# Patient Record
Sex: Male | Born: 1980 | Race: Black or African American | Hispanic: No | Marital: Married | Smoking: Former smoker
Health system: Southern US, Community
[De-identification: ages and names within clinical notes are randomized; demographics above are authoritative.]

## PROBLEM LIST (undated history)

## (undated) DIAGNOSIS — G43909 Migraine, unspecified, not intractable, without status migrainosus: Secondary | ICD-10-CM

## (undated) DIAGNOSIS — E785 Hyperlipidemia, unspecified: Secondary | ICD-10-CM

## (undated) DIAGNOSIS — I10 Essential (primary) hypertension: Secondary | ICD-10-CM

## (undated) DIAGNOSIS — Z9289 Personal history of other medical treatment: Secondary | ICD-10-CM

## (undated) DIAGNOSIS — F1210 Cannabis abuse, uncomplicated: Secondary | ICD-10-CM

## (undated) DIAGNOSIS — I2119 ST elevation (STEMI) myocardial infarction involving other coronary artery of inferior wall: Secondary | ICD-10-CM

## (undated) DIAGNOSIS — I2510 Atherosclerotic heart disease of native coronary artery without angina pectoris: Secondary | ICD-10-CM

## (undated) DIAGNOSIS — Z9861 Coronary angioplasty status: Secondary | ICD-10-CM

## (undated) HISTORY — PX: CARDIAC CATHETERIZATION: SHX172

## (undated) HISTORY — DX: Coronary angioplasty status: Z98.61

## (undated) HISTORY — DX: ST elevation (STEMI) myocardial infarction involving other coronary artery of inferior wall: I21.19

## (undated) HISTORY — DX: Atherosclerotic heart disease of native coronary artery without angina pectoris: I25.10

---
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
## 2000-03-03 ENCOUNTER — Emergency Department (HOSPITAL_COMMUNITY): Admission: EM | Admit: 2000-03-03 | Discharge: 2000-03-03 | Payer: Self-pay | Admitting: Emergency Medicine

## 2000-05-09 ENCOUNTER — Emergency Department (HOSPITAL_COMMUNITY): Admission: EM | Admit: 2000-05-09 | Discharge: 2000-05-09 | Payer: Self-pay | Admitting: Emergency Medicine

## 2000-05-09 ENCOUNTER — Encounter: Payer: Self-pay | Admitting: Emergency Medicine

## 2004-03-17 ENCOUNTER — Emergency Department (HOSPITAL_COMMUNITY): Admission: EM | Admit: 2004-03-17 | Discharge: 2004-03-17 | Payer: Self-pay | Admitting: Emergency Medicine

## 2004-06-26 ENCOUNTER — Ambulatory Visit: Payer: Self-pay | Admitting: Family Medicine

## 2009-09-16 HISTORY — PX: LEFT HEART CATH AND CORONARY ANGIOGRAPHY: CATH118249

## 2009-10-04 ENCOUNTER — Inpatient Hospital Stay (HOSPITAL_COMMUNITY): Admission: EM | Admit: 2009-10-04 | Discharge: 2009-10-07 | Payer: Self-pay | Admitting: Emergency Medicine

## 2009-10-04 ENCOUNTER — Emergency Department (HOSPITAL_COMMUNITY): Admission: EM | Admit: 2009-10-04 | Discharge: 2009-10-04 | Payer: Self-pay | Admitting: Family Medicine

## 2009-10-04 ENCOUNTER — Encounter: Payer: Self-pay | Admitting: Physician Assistant

## 2009-10-24 ENCOUNTER — Ambulatory Visit: Payer: Self-pay | Admitting: Family Medicine

## 2009-10-24 ENCOUNTER — Encounter: Payer: Self-pay | Admitting: Physician Assistant

## 2009-10-24 ENCOUNTER — Telehealth: Payer: Self-pay | Admitting: Physician Assistant

## 2009-10-24 DIAGNOSIS — J45909 Unspecified asthma, uncomplicated: Secondary | ICD-10-CM | POA: Insufficient documentation

## 2009-10-24 LAB — CONVERTED CEMR LAB
ALT: 33 units/L (ref 0–53)
AST: 18 units/L (ref 0–37)
Albumin: 4.7 g/dL (ref 3.5–5.2)
Alkaline Phosphatase: 70 units/L (ref 39–117)
BUN: 12 mg/dL (ref 6–23)
CO2: 24 meq/L (ref 19–32)
Calcium: 9.3 mg/dL (ref 8.4–10.5)
Chloride: 106 meq/L (ref 96–112)
Creatinine, Ser: 0.72 mg/dL (ref 0.40–1.50)
Glucose, Bld: 141 mg/dL — ABNORMAL HIGH (ref 70–99)
Potassium: 4.1 meq/L (ref 3.5–5.3)
Sodium: 140 meq/L (ref 135–145)
Total Bilirubin: 0.6 mg/dL (ref 0.3–1.2)
Total Protein: 7.2 g/dL (ref 6.0–8.3)

## 2009-10-25 ENCOUNTER — Telehealth: Payer: Self-pay | Admitting: Physician Assistant

## 2009-10-25 DIAGNOSIS — R7309 Other abnormal glucose: Secondary | ICD-10-CM | POA: Insufficient documentation

## 2010-03-16 ENCOUNTER — Ambulatory Visit: Payer: Self-pay | Admitting: Physician Assistant

## 2010-04-18 NOTE — Progress Notes (Signed)
Summary: Needs A1C  Phone Note Outgoing Call   Summary of Call: kidney and liver function ok sugar is high have him come in for fasting sugar and A1C Initial call taken by: Tereso Newcomer PA-C,  October 25, 2009 10:35 AM  Follow-up for Phone Call        Left message on answering machine for pt to call back...Marland KitchenMarland KitchenArmenia Shannon  October 25, 2009 10:42 AM   Additional Follow-up for Phone Call Additional follow up Details #1::        pt is aware and has appt scheduled.... Additional Follow-up by: [place] Shannon,  October 26, 2009 4:06 PM  New Problems: HYPERGLYCEMIA (ICD-790.29)   New Problems: HYPERGLYCEMIA (ICD-790.29)    Impression & Recommendations:  Problem # 1:  HYPERGLYCEMIA (ICD-790.29) on random cmet get fasting CBG and A1C  Complete Medication List: 1)  Aspirin 325 Mg Tabs (Aspirin) .... Take 1 tablet by mouth once a day 2)  Plavix 75 Mg Tabs (Clopidogrel bisulfate) .... Take 1 tablet by mouth once a day 3)  Labetalol Hcl 200 Mg Tabs (Labetalol hcl) .... Take 1 tablet by mouth two times a day 4)  Lisinopril 20 Mg Tabs (Lisinopril) .... Take 1 tablet by mouth once a day 5)  Metoprolol Tartrate 50 Mg Tabs (Metoprolol tartrate) .... Take 1 tablet by mouth two times a day 6)  Simvastatin 20 Mg Tabs (Simvastatin) .... Take 1 tab by mouth at bedtime 7)  Nitrostat 0.4 Mg Subl (Nitroglycerin) .... Take one under your tongue every 5 minutes as needed for chest pain

## 2010-04-18 NOTE — Letter (Signed)
Summary: PT INFORMATION SHEET  PT INFORMATION SHEET   Imported By: Arta Bruce 10/31/2009 15:56:40  _____________________________________________________________________  External Attachment:    Type:   Image     Comment:   External Document

## 2010-04-18 NOTE — Progress Notes (Signed)
Summary: Current Medication regimen  Phone Note Outgoing Call   Summary of Call: Notify patient I spoke to the NP for Dr. Deborah Chalk. They plan to increase his metoprolol and decrease his labetalol (until it is stopped) over time. So, continue same meds and discuss with Dr. Deborah Chalk at f/u.  Initial call taken by: Tereso Newcomer PA-C,  October 24, 2009 3:54 PM  Follow-up for Phone Call        Advised pt. of provider's response and instructions -- verbalized understanding.   Follow-up by: Dutch Quint RN,  October 24, 2009 4:52 PM

## 2010-04-18 NOTE — Assessment & Plan Note (Signed)
Summary: XFU, HEART ATTACK/LR   Vital Signs:  Patient profile:   30 year old male Height:      72 inches Weight:      268 pounds BMI:     36.48 Temp:     98.0 degrees F Pulse rate:   83 / minute Pulse rhythm:   regular Resp:     16 per minute BP sitting:   138 / 86  (left arm) Cuff size:   large  Vitals Entered By: Vesta Mixer CMA (October 24, 2009 2:09 PM) CC: One time hosp f/u--Heart attack was in there for 4 days Pain Assessment Patient in pain? no       Does patient need assistance? Ambulation Normal   Primary Care Provider:  Tereso Newcomer, PA-C  CC:  One time hosp f/u--Heart attack was in there for 4 days.  History of Present Illness: 30 year old male presents as a new patient.  He presented to [facility] on July 19 with complaints of chest discomfort.  He had several bouts of chest discomfort prior to this.  This time, it was radiating to his back.  Initially, he had an EKG and it sounds as though they were going to send him home from the urgent care.  However, EMS arrived and they checked another EKG and he had inferior ST elevation.  He was sent to [facility] and went directly to the cardiac catheterization lab.  His cardiac catheterization demonstrated an EF of 55%, question of mild anterior hypokinesis, luminal irregularities but no significant obstructive disease.  His CKMB  peaked at 88.9 and his troponin 8.04.  He was treated medically with Plavix and aspirin.  He was started on 2 beta blockers, ACE inhibitor, and a statin.  He has done well since discharge fron the hospital.  He denies any further chest discomfort.  He denies shortness of breath.  He denies orthopnea, PND or edema.  He denies syncope.  He is somewhat concerned about what caused his heart attack.  He asked whether or not coronary vasospasm could be a possibility.  Current Medications (verified): 1)  Aspirin 325 Mg Tabs (Aspirin) .... Take 1 Tablet By Mouth Once A Day 2)  Plavix 75  Mg Tabs (Clopidogrel Bisulfate) .... Take 1 Tablet By Mouth Once A Day 3)  Labetalol Hcl 200 Mg Tabs (Labetalol Hcl) .... Take 1 Tablet By Mouth Two Times A Day 4)  Lisinopril 20 Mg Tabs (Lisinopril) .... Take 1 Tablet By Mouth Once A Day 5)  Metoprolol Tartrate 50 Mg Tabs (Metoprolol Tartrate) .... Take 1 Tablet By Mouth Two Times A Day 6)  Simvastatin 20 Mg Tabs (Simvastatin) .... Take 1 Tab By Mouth At Bedtime 7)  Nitrostat 0.4 Mg Subl (Nitroglycerin) .... Take One Under Your Tongue Every 5 Minutes As Needed For Chest Pain  Allergies (verified): 1)  ! Penicillin  Past History:  Past Medical History: 4/06 BMI 34.1, 4/06 CMP wnl s/p Inferior STEMI 10/04/2009   a.  heart cath with luminal irreg's but no significant blockage   b.  LVF normal; EF 55%; ? very mild inferior HK Hyperlipidemia Hypertension Asthma   a. as a child; no Rx at this time  Past Surgical History: cardiac cath 10/04/2009:  luminal irregs RCA, LAD and CFx, no flow liimiting lesions; EF 55% with ? mild inf HK  Family History: aunts w/ breast CA,  father- alive, age 55, MIx2, s/p CABG HTN, grandfather- dead of MI,  mother- alive, 36,  HTN, No h/o DM  Social History: From [place]. Played football/basketball @ [employer].   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   Enjoys work.   Quit cigs 09/2009 (previous 12 cigs/day) no EtOH, no drugs.   Unmarried,  1 daughter  and 1 son  Review of Systems      See HPI General:  Denies chills and fever. GI:  Denies bloody stools and dark tarry stools. GU:  Denies hematuria.  Physical Exam  General:  alert, well-developed, and well-nourished.   Head:  normocephalic and atraumatic.   Neck:  supple.  no jvd  Lungs:  normal breath sounds, no crackles, and no wheezes.   Heart:  normal rate, regular rhythm, no murmur, and no gallop.   Abdomen:  soft, non-tender, no hepatomegaly, and no splenomegaly.     Extremities:  no edema  Neurologic:  alert & oriented X3 and cranial nerves II-XII intact.   Psych:  normally interactive.     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 if he had coronary vasospasm as a cause of his MI has f/u with Card in next 1-2 weeks . . . ? if labetalol should be discontinued (on 2 beta blockers) and a calcium channel blocker or nitrate should be added not sure why he is on 2 beta blockers but will defer changing meds to cardiology (HR ok) patient has a lot of questions regarding spasm advised him to discuss with Dr. Rudean Haskell . Marland Kitchendiscussed with Laveda Abbe, NP for Dr. Deborah Chalk and the plan was to decrease his labetalol and increase his metoprolol over time  still on ASA ran out of Plavix . . . will try to get him some today or tomorrow with our pharmacy  His updated medication list for this problem includes:    Aspirin 325 Mg Tabs (Aspirin) .Marland Kitchen... Take 1 tablet by mouth once a day    Plavix 75 Mg Tabs (Clopidogrel bisulfate) .Marland Kitchen... Take 1 tablet by mouth once a day    Labetalol Hcl 200 Mg Tabs (Labetalol hcl) .Marland Kitchen... Take 1 tablet by mouth two times a day    Lisinopril 20 Mg Tabs (Lisinopril) .Marland Kitchen... Take 1 tablet by mouth once a day    Metoprolol Tartrate 50 Mg Tabs (Metoprolol tartrate) .Marland Kitchen... Take 1 tablet by mouth two times a day    Nitrostat 0.4 Mg Subl (Nitroglycerin) .Marland Kitchen... Take one under your tongue every 5 minutes as needed for chest pain  Problem # 2:  HYPERTENSION (ICD-401.9) Assessment: Improved  continue same meds  Orders: T-Comprehensive Metabolic Panel ([id number])  His updated medication list for this problem includes:    Labetalol Hcl 200 Mg Tabs (Labetalol hcl) .Marland Kitchen... Take 1 tablet by mouth two times a day    Lisinopril 20 Mg Tabs (Lisinopril) .Marland Kitchen... Take 1 tablet by mouth once a day    Metoprolol Tartrate 50 Mg Tabs (Metoprolol tartrate) .Marland Kitchen... Take 1  tablet by mouth two times a day  Problem # 3:  HYPERLIPIDEMIA (ICD-272.4)  repeat FLP and LFTs in 3 mos LDL 130 in 09/2009 at hosp  Orders: T-Comprehensive Metabolic Panel [phone])  His updated medication list for this problem includes:    Simvastatin 20 Mg Tabs (Simvastatin) .Marland Kitchen... Take 1 tab by mouth at bedtime  Problem # 4:  Preventive Health Care (ICD-V70.0) get CPE in 3 mos Hgb 16.7  in hosp 09/2009  Problem # 5:  ASTHMA (ICD-493.90) intermittent no symptoms in years  Complete Medication List: 1)  Aspirin 325 Mg Tabs (Aspirin) .... Take 1 tablet by mouth once a day 2)  Plavix 75 Mg Tabs (Clopidogrel bisulfate) .... Take 1 tablet by mouth once a day 3)  Labetalol Hcl 200 Mg Tabs (Labetalol hcl) .... Take 1 tablet by mouth two times a day 4)  Lisinopril 20 Mg Tabs (Lisinopril) .... Take 1 tablet by mouth once a day 5)  Metoprolol Tartrate 50 Mg Tabs (Metoprolol tartrate) .... Take 1 tablet by mouth two times a day 6)  Simvastatin 20 Mg Tabs (Simvastatin) .... Take 1 tab by mouth at bedtime 7)  Nitrostat 0.4 Mg Subl (Nitroglycerin) .... Take one under your tongue every 5 minutes as needed for chest pain  Patient Instructions: 1)  Get eligibility appointment. 2)  We will get our pharmacy to get your Plavix filled today or tomorrow. 3)  Keep taking Aspirin daily. 4)  Try to avoid caffeine as much as possible, over the counter cold medicines, etc.  Avoid any type of stimulant or decongestant. 5)  Please schedule a follow-up appointment in 3 months with [facility] for CPE.  Come fasting for lipids check.  Prescriptions: NITROSTAT 0.4 MG SUBL (NITROGLYCERIN) Take one under your tongue every 5 minutes as needed for chest pain  #25 x 11   Entered and Authorized by:   Tereso Newcomer PA-C   Signed by:   Tereso Newcomer PA-C on 10/24/2009   Method used:   Faxed to ...       [facility] (retail)       [address] [place], [place]  [address]        Ph: [phone]       Fax: [phone]   RxID:   [id number] PLAVIX 75 MG TABS (CLOPIDOGREL BISULFATE) Take 1 tablet by mouth once a day  #30 x 11   Entered and Authorized by:   Tereso Newcomer PA-C   Signed by:   Tereso Newcomer PA-C on 10/24/2009   Method used:   Faxed to ...       [facility] - [facility] (retail)       [address] [place], [place]  [address]       Ph: [phone]       Fax: [phone]   RxID:   [id number]

## 2010-04-18 NOTE — Letter (Signed)
Summary: CARDIAC CATHETERIZATION  CARDIAC CATHETERIZATION   Imported By: Arta Bruce 10/28/2009 15:37:55  _____________________________________________________________________  External Attachment:    Type:   Image     Comment:   External Document

## 2010-06-03 LAB — MRSA PCR SCREENING: MRSA by PCR: NEGATIVE

## 2010-06-03 LAB — DIFFERENTIAL
Eosinophils Absolute: 0 10*3/uL (ref 0.0–0.7)
Eosinophils Relative: 0 % (ref 0–5)
Lymphocytes Relative: 8 % — ABNORMAL LOW (ref 12–46)
Lymphs Abs: 1.1 10*3/uL (ref 0.7–4.0)
Monocytes Absolute: 0.6 10*3/uL (ref 0.1–1.0)
Monocytes Relative: 4 % (ref 3–12)

## 2010-06-03 LAB — POCT CARDIAC MARKERS
CKMB, poc: 3.1 ng/mL (ref 1.0–8.0)
Myoglobin, poc: 100 ng/mL (ref 12–200)

## 2010-06-03 LAB — CBC
HCT: 49.9 % (ref 39.0–52.0)
MCH: 31.7 pg (ref 26.0–34.0)
MCH: 32.1 pg (ref 26.0–34.0)
MCHC: 33.9 g/dL (ref 30.0–36.0)
MCV: 94.5 fL (ref 78.0–100.0)
MCV: 94.6 fL (ref 78.0–100.0)
Platelets: 175 10*3/uL (ref 150–400)
Platelets: 193 10*3/uL (ref 150–400)
RBC: 5.29 MIL/uL (ref 4.22–5.81)
RBC: 5.34 MIL/uL (ref 4.22–5.81)
RDW: 13.3 % (ref 11.5–15.5)
WBC: 14.2 10*3/uL — ABNORMAL HIGH (ref 4.0–10.5)

## 2010-06-03 LAB — POCT I-STAT, CHEM 8
BUN: 12 mg/dL (ref 6–23)
Calcium, Ion: 1.1 mmol/L — ABNORMAL LOW (ref 1.12–1.32)
Chloride: 105 mEq/L (ref 96–112)
Creatinine, Ser: 0.8 mg/dL (ref 0.4–1.5)
Glucose, Bld: 97 mg/dL (ref 70–99)
HCT: 54 % — ABNORMAL HIGH (ref 39.0–52.0)
Hemoglobin: 18.4 g/dL — ABNORMAL HIGH (ref 13.0–17.0)
Potassium: 4.1 mEq/L (ref 3.5–5.1)
Sodium: 141 mEq/L (ref 135–145)
TCO2: 26 mmol/L (ref 0–100)

## 2010-06-03 LAB — LIPID PANEL
Triglycerides: 142 mg/dL (ref <150)
VLDL: 28 mg/dL (ref 0–40)

## 2010-06-03 LAB — BASIC METABOLIC PANEL
BUN: 8 mg/dL (ref 6–23)
CO2: 28 mEq/L (ref 19–32)
Chloride: 104 mEq/L (ref 96–112)
Creatinine, Ser: 0.74 mg/dL (ref 0.4–1.5)
GFR calc Af Amer: >60 mL/min (ref >60)
Potassium: 4 mEq/L (ref 3.5–5.1)

## 2010-06-03 LAB — CARDIAC PANEL(CRET KIN+CKTOT+MB+TROPI)
CK, MB: 70.9 ng/mL (ref 0.3–4.0)
CK, MB: 88.9 ng/mL (ref 0.3–4.0)
Relative Index: 10.9 — ABNORMAL HIGH (ref 0.0–2.5)
Relative Index: 8.9 — ABNORMAL HIGH (ref 0.0–2.5)
Total CK: 795 U/L — ABNORMAL HIGH (ref 7–232)
Troponin I: 4.24 ng/mL (ref 0.00–0.06)
Troponin I: 8.04 ng/mL (ref 0.00–0.06)

## 2010-06-03 LAB — HEPARIN LEVEL (UNFRACTIONATED)
Heparin Unfractionated: 0.16 IU/mL — ABNORMAL LOW (ref 0.30–0.70)
Heparin Unfractionated: 0.37 IU/mL (ref 0.30–0.70)

## 2013-11-20 ENCOUNTER — Encounter (HOSPITAL_BASED_OUTPATIENT_CLINIC_OR_DEPARTMENT_OTHER): Payer: Self-pay | Admitting: Emergency Medicine

## 2013-11-20 ENCOUNTER — Inpatient Hospital Stay (HOSPITAL_BASED_OUTPATIENT_CLINIC_OR_DEPARTMENT_OTHER)
Admission: EM | Admit: 2013-11-20 | Discharge: 2013-11-23 | DRG: 247 | Disposition: A | Discharge status: Home / Self Care | Payer: Managed Care, Other (non HMO) | Attending: Cardiology | Admitting: Cardiology

## 2013-11-20 ENCOUNTER — Emergency Department (HOSPITAL_BASED_OUTPATIENT_CLINIC_OR_DEPARTMENT_OTHER): Payer: Managed Care, Other (non HMO)

## 2013-11-20 ENCOUNTER — Encounter (HOSPITAL_COMMUNITY)
Admission: EM | Disposition: A | Discharge status: Home / Self Care | Payer: Managed Care, Other (non HMO) | Source: Home / Self Care | Attending: Cardiology

## 2013-11-20 DIAGNOSIS — I10 Essential (primary) hypertension: Secondary | ICD-10-CM | POA: Diagnosis present

## 2013-11-20 DIAGNOSIS — I442 Atrioventricular block, complete: Secondary | ICD-10-CM | POA: Diagnosis not present

## 2013-11-20 DIAGNOSIS — I4729 Other ventricular tachycardia: Secondary | ICD-10-CM | POA: Diagnosis not present

## 2013-11-20 DIAGNOSIS — R0989 Other specified symptoms and signs involving the circulatory and respiratory systems: Secondary | ICD-10-CM | POA: Diagnosis present

## 2013-11-20 DIAGNOSIS — I472 Ventricular tachycardia, unspecified: Secondary | ICD-10-CM | POA: Diagnosis not present

## 2013-11-20 DIAGNOSIS — Z6830 Body mass index (BMI) 30.0-30.9, adult: Secondary | ICD-10-CM | POA: Diagnosis not present

## 2013-11-20 DIAGNOSIS — I219 Acute myocardial infarction, unspecified: Secondary | ICD-10-CM

## 2013-11-20 DIAGNOSIS — Z8249 Family history of ischemic heart disease and other diseases of the circulatory system: Secondary | ICD-10-CM | POA: Diagnosis not present

## 2013-11-20 DIAGNOSIS — I2119 ST elevation (STEMI) myocardial infarction involving other coronary artery of inferior wall: Principal | ICD-10-CM | POA: Diagnosis present

## 2013-11-20 DIAGNOSIS — E8779 Other fluid overload: Secondary | ICD-10-CM | POA: Diagnosis present

## 2013-11-20 DIAGNOSIS — F1210 Cannabis abuse, uncomplicated: Secondary | ICD-10-CM | POA: Diagnosis present

## 2013-11-20 DIAGNOSIS — IMO0001 Reserved for inherently not codable concepts without codable children: Secondary | ICD-10-CM | POA: Diagnosis present

## 2013-11-20 DIAGNOSIS — Z87891 Personal history of nicotine dependence: Secondary | ICD-10-CM

## 2013-11-20 DIAGNOSIS — R0683 Snoring: Secondary | ICD-10-CM

## 2013-11-20 DIAGNOSIS — Z9861 Coronary angioplasty status: Secondary | ICD-10-CM | POA: Diagnosis not present

## 2013-11-20 DIAGNOSIS — I2582 Chronic total occlusion of coronary artery: Secondary | ICD-10-CM | POA: Diagnosis present

## 2013-11-20 DIAGNOSIS — E66812 Obesity, class 2: Secondary | ICD-10-CM | POA: Diagnosis present

## 2013-11-20 DIAGNOSIS — E785 Hyperlipidemia, unspecified: Secondary | ICD-10-CM | POA: Diagnosis present

## 2013-11-20 DIAGNOSIS — I498 Other specified cardiac arrhythmias: Secondary | ICD-10-CM | POA: Diagnosis not present

## 2013-11-20 DIAGNOSIS — R51 Headache: Secondary | ICD-10-CM | POA: Diagnosis present

## 2013-11-20 DIAGNOSIS — I2129 ST elevation (STEMI) myocardial infarction involving other sites: Secondary | ICD-10-CM | POA: Insufficient documentation

## 2013-11-20 DIAGNOSIS — I252 Old myocardial infarction: Secondary | ICD-10-CM | POA: Insufficient documentation

## 2013-11-20 DIAGNOSIS — R0609 Other forms of dyspnea: Secondary | ICD-10-CM | POA: Diagnosis present

## 2013-11-20 DIAGNOSIS — Z880 Allergy status to penicillin: Secondary | ICD-10-CM | POA: Diagnosis not present

## 2013-11-20 DIAGNOSIS — I2510 Atherosclerotic heart disease of native coronary artery without angina pectoris: Secondary | ICD-10-CM

## 2013-11-20 DIAGNOSIS — R072 Precordial pain: Secondary | ICD-10-CM

## 2013-11-20 DIAGNOSIS — I213 ST elevation (STEMI) myocardial infarction of unspecified site: Secondary | ICD-10-CM

## 2013-11-20 DIAGNOSIS — R079 Chest pain, unspecified: Secondary | ICD-10-CM | POA: Diagnosis present

## 2013-11-20 HISTORY — DX: Hyperlipidemia, unspecified: E78.5

## 2013-11-20 HISTORY — PX: PERCUTANEOUS CORONARY STENT INTERVENTION (PCI-S): SHX6016

## 2013-11-20 HISTORY — DX: Atherosclerotic heart disease of native coronary artery without angina pectoris: I25.10

## 2013-11-20 HISTORY — DX: Essential (primary) hypertension: I10

## 2013-11-20 HISTORY — PX: TRANSTHORACIC ECHOCARDIOGRAM: SHX275

## 2013-11-20 HISTORY — PX: LEFT HEART CATHETERIZATION WITH CORONARY ANGIOGRAM: SHX5451

## 2013-11-20 HISTORY — DX: ST elevation (STEMI) myocardial infarction involving other coronary artery of inferior wall: I21.19

## 2013-11-20 HISTORY — DX: Cannabis abuse, uncomplicated: F12.10

## 2013-11-20 HISTORY — DX: Migraine, unspecified, not intractable, without status migrainosus: G43.909

## 2013-11-20 HISTORY — DX: Morbid (severe) obesity due to excess calories: E66.01

## 2013-11-20 HISTORY — DX: Coronary angioplasty status: Z98.61

## 2013-11-20 LAB — MRSA PCR SCREENING: MRSA by PCR: NEGATIVE

## 2013-11-20 LAB — CBC
HCT: 46.4 % (ref 39.0–52.0)
HCT: 49.2 % (ref 39.0–52.0)
HCT: 48.3 % (ref 39.0–52.0)
HEMOGLOBIN: 17.2 g/dL — AB (ref 13.0–17.0)
Hemoglobin: 16.2 g/dL (ref 13.0–17.0)
Hemoglobin: 17.3 g/dL — ABNORMAL HIGH (ref 13.0–17.0)
MCH: 31 pg (ref 26.0–34.0)
MCH: 31.2 pg (ref 26.0–34.0)
MCH: 31.4 pg (ref 26.0–34.0)
MCHC: 34.9 g/dL (ref 30.0–36.0)
MCHC: 35.2 g/dL (ref 30.0–36.0)
MCHC: 35.6 g/dL (ref 30.0–36.0)
MCV: 88.9 fL (ref 78.0–100.0)
MCV: 88.8 fL (ref 78.0–100.0)
MCV: 88.1 fL (ref 78.0–100.0)
PLATELETS: 261 10*3/uL (ref 150–400)
PLATELETS: 244 10*3/uL (ref 150–400)
Platelets: 224 10*3/uL (ref 150–400)
RBC: 5.22 MIL/uL (ref 4.22–5.81)
RBC: 5.54 MIL/uL (ref 4.22–5.81)
RBC: 5.48 MIL/uL (ref 4.22–5.81)
RDW: 13.1 % (ref 11.5–15.5)
RDW: 13.2 % (ref 11.5–15.5)
RDW: 13.1 % (ref 11.5–15.5)
WBC: 13.6 10*3/uL — ABNORMAL HIGH (ref 4.0–10.5)
WBC: 12.5 10*3/uL — ABNORMAL HIGH (ref 4.0–10.5)
WBC: 17.6 10*3/uL — ABNORMAL HIGH (ref 4.0–10.5)

## 2013-11-20 LAB — COMPREHENSIVE METABOLIC PANEL
ALT: 16 U/L (ref 0–53)
ANION GAP: 13 (ref 5–15)
AST: 13 U/L (ref 0–37)
Albumin: 4.1 g/dL (ref 3.5–5.2)
Alkaline Phosphatase: 85 U/L (ref 39–117)
BUN: 17 mg/dL (ref 6–23)
CALCIUM: 9.5 mg/dL (ref 8.4–10.5)
CO2: 26 meq/L (ref 19–32)
CREATININE: 0.8 mg/dL (ref 0.50–1.35)
Chloride: 100 mEq/L (ref 96–112)
GFR calc Af Amer: >90 mL/min (ref >90)
Glucose, Bld: 188 mg/dL — ABNORMAL HIGH (ref 70–99)
Potassium: 4.2 mEq/L (ref 3.7–5.3)
Sodium: 139 mEq/L (ref 137–147)
Total Bilirubin: 0.8 mg/dL (ref 0.3–1.2)
Total Protein: 8 g/dL (ref 6.0–8.3)

## 2013-11-20 LAB — TROPONIN I
Troponin I: <0.3 ng/mL (ref <0.30)
Troponin I: >20 ng/mL (ref <0.30)

## 2013-11-20 LAB — PROTIME-INR
INR: 1.05 (ref 0.00–1.49)
PROTHROMBIN TIME: 13.7 s (ref 11.6–15.2)

## 2013-11-20 LAB — CREATININE, SERUM
Creatinine, Ser: 0.61 mg/dL (ref 0.50–1.35)
GFR calc Af Amer: >90 mL/min (ref >90)
GFR calc non Af Amer: >90 mL/min (ref >90)

## 2013-11-20 LAB — APTT: aPTT: 26 seconds (ref 24–37)

## 2013-11-20 SURGERY — LEFT HEART CATHETERIZATION WITH CORONARY ANGIOGRAM
Anesthesia: LOCAL

## 2013-11-20 MED ORDER — FUROSEMIDE 10 MG/ML IJ SOLN: 40.0000 mg | Freq: Once | INTRAMUSCULAR | Status: DC

## 2013-11-20 MED ORDER — BIVALIRUDIN 250 MG IV SOLR
INTRAVENOUS | Status: AC
  Filled 2013-11-20: qty 250

## 2013-11-20 MED ORDER — SODIUM CHLORIDE 0.9 % IV SOLN: INTRAVENOUS | Status: DC

## 2013-11-20 MED ORDER — HEPARIN SODIUM (PORCINE) 5000 UNIT/ML IJ SOLN
5000.0000 [IU] | Freq: Three times a day (TID) | INTRAMUSCULAR | Status: DC
  Administered 2013-11-20 – 2013-11-23 (×8): 5000 [IU] via SUBCUTANEOUS
  Filled 2013-11-20 (×11): qty 1

## 2013-11-20 MED ORDER — PROMETHAZINE HCL 25 MG/ML IJ SOLN
12.5000 mg | Freq: Once | INTRAMUSCULAR | Status: AC
  Administered 2013-11-20: 12.5 mg via INTRAVENOUS
  Filled 2013-11-20: qty 1

## 2013-11-20 MED ORDER — LISINOPRIL 2.5 MG PO TABS
2.5000 mg | ORAL_TABLET | Freq: Every day | ORAL | Status: DC
  Administered 2013-11-20: 2.5 mg via ORAL
  Filled 2013-11-20: qty 1

## 2013-11-20 MED ORDER — SODIUM CHLORIDE 0.9 % IJ SOLN: 3.0000 mL | Freq: Two times a day (BID) | INTRAMUSCULAR | Status: DC

## 2013-11-20 MED ORDER — HEPARIN SODIUM (PORCINE) 1000 UNIT/ML IJ SOLN
INTRAMUSCULAR | Status: AC
  Filled 2013-11-20: qty 1

## 2013-11-20 MED ORDER — PRASUGREL HCL 10 MG PO TABS
ORAL_TABLET | ORAL | Status: AC
  Filled 2013-11-20: qty 6

## 2013-11-20 MED ORDER — HEPARIN BOLUS VIA INFUSION: 4000.0000 [IU] | Freq: Once | INTRAVENOUS | Status: DC

## 2013-11-20 MED ORDER — TIROFIBAN HCL IV 5 MG/100ML
0.1500 ug/kg/min | INTRAVENOUS | Status: AC
  Administered 2013-11-20 (×4): 0.15 ug/kg/min via INTRAVENOUS
  Filled 2013-11-20 (×6): qty 100

## 2013-11-20 MED ORDER — FENTANYL CITRATE 0.05 MG/ML IJ SOLN
INTRAMUSCULAR | Status: AC
  Filled 2013-11-20: qty 2

## 2013-11-20 MED ORDER — ASPIRIN 81 MG PO CHEW
324.0000 mg | CHEWABLE_TABLET | Freq: Once | ORAL | Status: AC
  Administered 2013-11-20: 324 mg via ORAL

## 2013-11-20 MED ORDER — MIDAZOLAM HCL 2 MG/2ML IJ SOLN
INTRAMUSCULAR | Status: AC
  Filled 2013-11-20: qty 2

## 2013-11-20 MED ORDER — HEPARIN (PORCINE) IN NACL 100-0.45 UNIT/ML-% IJ SOLN
INTRAMUSCULAR | Status: AC
  Filled 2013-11-20: qty 250

## 2013-11-20 MED ORDER — METOPROLOL TARTRATE 1 MG/ML IV SOLN
INTRAVENOUS | Status: AC
  Filled 2013-11-20: qty 5

## 2013-11-20 MED ORDER — MORPHINE SULFATE 2 MG/ML IJ SOLN
2.0000 mg | INTRAMUSCULAR | Status: DC | PRN
  Administered 2013-11-20 (×2): 2 mg via INTRAVENOUS
  Filled 2013-11-20 (×3): qty 1

## 2013-11-20 MED ORDER — PNEUMOCOCCAL VAC POLYVALENT 25 MCG/0.5ML IJ INJ
0.5000 mL | INJECTION | INTRAMUSCULAR | Status: DC
  Filled 2013-11-20: qty 0.5

## 2013-11-20 MED ORDER — NITROGLYCERIN IN D5W 200-5 MCG/ML-% IV SOLN: 2.0000 ug/min | INTRAVENOUS | Status: DC

## 2013-11-20 MED ORDER — HYDRALAZINE HCL 20 MG/ML IJ SOLN
10.0000 mg | Freq: Once | INTRAMUSCULAR | Status: AC
  Administered 2013-11-21: 10 mg via INTRAVENOUS
  Filled 2013-11-20: qty 1

## 2013-11-20 MED ORDER — SODIUM CHLORIDE 0.9 % IV SOLN
1.7500 mg/kg/h | INTRAVENOUS | Status: AC
  Administered 2013-11-20: 1.75 mg/kg/h via INTRAVENOUS
  Filled 2013-11-20: qty 250

## 2013-11-20 MED ORDER — ASPIRIN 81 MG PO CHEW
CHEWABLE_TABLET | ORAL | Status: AC
  Filled 2013-11-20: qty 4

## 2013-11-20 MED ORDER — CARVEDILOL 6.25 MG PO TABS
6.2500 mg | ORAL_TABLET | Freq: Two times a day (BID) | ORAL | Status: DC
  Administered 2013-11-20 – 2013-11-21 (×3): 6.25 mg via ORAL
  Filled 2013-11-20 (×6): qty 1

## 2013-11-20 MED ORDER — BLOOD PRESSURE CONTROL BOOK
Freq: Once | Status: AC
  Administered 2013-11-21: 05:00:00
  Filled 2013-11-20: qty 1

## 2013-11-20 MED ORDER — ASPIRIN EC 81 MG PO TBEC
81.0000 mg | DELAYED_RELEASE_TABLET | Freq: Every day | ORAL | Status: DC
  Administered 2013-11-21 – 2013-11-23 (×3): 81 mg via ORAL
  Filled 2013-11-20 (×3): qty 1

## 2013-11-20 MED ORDER — ONDANSETRON HCL 4 MG/2ML IJ SOLN
4.0000 mg | Freq: Four times a day (QID) | INTRAMUSCULAR | Status: DC | PRN
  Administered 2013-11-20 (×2): 4 mg via INTRAVENOUS
  Filled 2013-11-20 (×2): qty 2

## 2013-11-20 MED ORDER — METOPROLOL TARTRATE 1 MG/ML IV SOLN: 2.5000 mg | Freq: Four times a day (QID) | INTRAVENOUS | Status: DC | PRN

## 2013-11-20 MED ORDER — SODIUM CHLORIDE 0.9 % IV SOLN
INTRAVENOUS | Status: AC
  Administered 2013-11-20: 06:00:00 via INTRAVENOUS

## 2013-11-20 MED ORDER — VERAPAMIL HCL 2.5 MG/ML IV SOLN
INTRAVENOUS | Status: AC
  Filled 2013-11-20: qty 2

## 2013-11-20 MED ORDER — NITROGLYCERIN 1 MG/10 ML FOR IR/CATH LAB
INTRA_ARTERIAL | Status: AC
  Filled 2013-11-20: qty 10

## 2013-11-20 MED ORDER — SODIUM CHLORIDE 0.9 % IJ SOLN: 3.0000 mL | INTRAMUSCULAR | Status: DC | PRN

## 2013-11-20 MED ORDER — ATORVASTATIN CALCIUM 80 MG PO TABS
80.0000 mg | ORAL_TABLET | Freq: Every day | ORAL | Status: DC
  Administered 2013-11-20 – 2013-11-22 (×3): 80 mg via ORAL
  Filled 2013-11-20 (×4): qty 1

## 2013-11-20 MED ORDER — TIROFIBAN HCL IV 12.5 MG/250 ML
INTRAVENOUS | Status: AC
  Filled 2013-11-20: qty 250

## 2013-11-20 MED ORDER — LISINOPRIL 2.5 MG PO TABS
2.5000 mg | ORAL_TABLET | Freq: Two times a day (BID) | ORAL | Status: DC
  Administered 2013-11-20 – 2013-11-21 (×2): 2.5 mg via ORAL
  Filled 2013-11-20 (×4): qty 1

## 2013-11-20 MED ORDER — HEPARIN SODIUM (PORCINE) 5000 UNIT/ML IJ SOLN: 60.0000 [IU]/kg | INTRAMUSCULAR | Status: DC

## 2013-11-20 MED ORDER — HEART ATTACK BOUNCING BOOK
Freq: Once | Status: AC
  Administered 2013-11-21: 05:00:00
  Filled 2013-11-20: qty 1

## 2013-11-20 MED ORDER — PRASUGREL HCL 10 MG PO TABS
10.0000 mg | ORAL_TABLET | Freq: Every day | ORAL | Status: DC
  Administered 2013-11-20 – 2013-11-23 (×4): 10 mg via ORAL
  Filled 2013-11-20 (×4): qty 1

## 2013-11-20 MED ORDER — SODIUM CHLORIDE 0.9 % IV SOLN: 250.0000 mL | INTRAVENOUS | Status: DC | PRN

## 2013-11-20 MED ORDER — ACETAMINOPHEN 325 MG PO TABS
650.0000 mg | ORAL_TABLET | ORAL | Status: DC | PRN
  Administered 2013-11-21 – 2013-11-22 (×5): 650 mg via ORAL
  Filled 2013-11-20 (×5): qty 2

## 2013-11-20 MED ORDER — HEPARIN SODIUM (PORCINE) 5000 UNIT/ML IJ SOLN
INTRAMUSCULAR | Status: AC
  Filled 2013-11-20: qty 1

## 2013-11-20 MED ORDER — FUROSEMIDE 10 MG/ML IJ SOLN
40.0000 mg | Freq: Once | INTRAMUSCULAR | Status: AC
  Administered 2013-11-20: 40 mg via INTRAVENOUS
  Filled 2013-11-20: qty 4

## 2013-11-20 MED ORDER — NITROGLYCERIN IN D5W 200-5 MCG/ML-% IV SOLN
INTRAVENOUS | Status: AC
  Filled 2013-11-20: qty 250

## 2013-11-20 NOTE — Clinical Social Work Note (Signed)
CSW received consult for medication assistance. Please consult nurse case manager for assistance with medications. CSW signing off as inappropriate referral.  Genelle Bal, MSW, LCSW [phone]

## 2013-11-20 NOTE — Care Management Note (Signed)
    Page 1 of 1   11/20/2013     9:53:12 AM CARE MANAGEMENT NOTE 11/20/2013  Patient:  Reginald Fernandez, Reginald Fernandez   Account Number:  [id number]  Date Initiated:  11/20/2013  Documentation initiated by:  Junius Creamer  Subjective/Objective Assessment:   adm w mi     Action/Plan:   lives w wife, pcp dr Lorin Picket weaver   Anticipated DC Date:     Anticipated DC Plan:        DC Planning Services  CM consult  Medication Assistance      Choice offered to / List presented to:             Status of service:   Medicare Important Message given?   (If response is "NO", the following Medicare IM given date fields will be blank) Date Medicare IM given:   Medicare IM given by:   Date Additional Medicare IM given:   Additional Medicare IM given by:    Discharge Disposition:    Per UR Regulation:  Reviewed for med. necessity/level of care/duration of stay  If discussed at Long Length of Stay Meetings, dates discussed:    Comments:  9/4 0951 debbie Harlea Goetzinger rn,bsn left pt effient 30day free and copay assist card. no ins listed on demographic sheet. left pt inform on [facility]. left pt assist for for effient on shadow chart for md to sign.

## 2013-11-20 NOTE — Progress Notes (Signed)
eLink Physician-Brief Progress Note Patient Name: Reginald Fernandez DOB: [birth year] MRN: [id number]   Date of Service  11/20/2013  HPI/Events of Note  67 M with PMH sign for INF MI and HTN presented to ED with SSCP with associated symptoms.  Found to have changes on EKG consistent with ANT STEMI.  Currently received ASA/Heprain/Morphine/NTG.  Plan for CC this AM.  Currently patient is HD stable sleeping.  Has a HR in the 60s.  O2 sats on [place] O2 are ranging from 85% up to 90% with evidence of apenic spells when observed via camera.  eICU Interventions  Plan: Continue with current rx per cardiology I called the charge nurse to inform her of the patient's cyclic drop in sats with recommendation to place the patient on FM O2. Will continue to monitor.     Intervention Category Evaluation Type: New Patient Evaluation  DETERDING,ELIZABETH 11/20/2013, 6:32 AM

## 2013-11-20 NOTE — Progress Notes (Signed)
Utilization Review Completed.Reginald Fernandez T9/06/2013  

## 2013-11-20 NOTE — ED Provider Notes (Signed)
CSN: [id number]     Arrival date & time 11/20/13  0218 History   First MD Initiated Contact with Patient 11/20/13 0221     Chief Complaint  Patient presents with  . Chest Pain      Patient is a 33 y.o. male presenting with chest pain. The history is provided by the patient. The history is limited by the condition of the patient.  Chest Pain Pain location:  Substernal area Pain severity:  Severe Onset quality:  Sudden Duration:  30 minutes Timing:  Constant Progression:  Worsening Chronicity:  New Relieved by:  Nothing Worsened by:  Nothing tried Associated symptoms: diaphoresis, shortness of breath and vomiting   Patient presents from home for chest pain He reports the CP started about 30 minutes ago He reports feeling SOB and has had vomiting   He reports he had a headache yesterday.  He took pain meds without relief and then "smoked a joint" about one hour ago for his headache.  He denies cocaine use  He reports h/o "heart attack" in 2011 but did not have stent placed.  For his HA, he has had headaches previously, and denies new arm/leg weakness today   Past Medical History  Diagnosis Date  . MI (myocardial infarction)   . Migraine    No past surgical history on file. No family history on file. History  Substance Use Topics  . Smoking status: Never Smoker   . Smokeless tobacco: Not on file  . Alcohol Use: No    Review of Systems  Unable to perform ROS: Acuity of condition  Constitutional: Positive for diaphoresis.  Respiratory: Positive for shortness of breath.   Cardiovascular: Positive for chest pain.  Gastrointestinal: Positive for vomiting. Negative for blood in stool.      Allergies  Penicillins  Home Medications   Prior to Admission medications   Not on File   BP 113/76  Pulse 67  Resp 16  SpO2 99% Physical Exam CONSTITUTIONAL: ill appearing, diaphoretic HEAD: Normocephalic/atraumatic EYES: EOMI/PERRL ENMT: Mucous membranes moist NECK:  supple no meningeal signs SPINE:entire spine nontender CV: S1/S2 noted, no murmurs/rubs/gallops noted LUNGS: Lungs are clear to auscultation bilaterally, no apparent distress ABDOMEN: soft, nontender, no rebound or guarding GU:no cva tenderness NEURO: Pt is resting with eyes closed.  He arouses to voice and follows all commands.  No arm/leg drift.  No facial droop  Noted.  He moves all extremities without difficulty EXTREMITIES: pulses normal/equal, full ROM, no LE edema or calf tenderness noted SKIN: warm, color normal PSYCH: no abnormalities of mood noted  ED Course  Procedures  CRITICAL CARE Performed by: Joya Gaskins Total critical care time: 33 Critical care time was exclusive of separately billable procedures and treating other patients. Critical care was necessary to treat or prevent imminent or life-threatening deterioration. Critical care was time spent personally by me on the following activities: development of treatment plan with patient and/or surrogate as well as nursing, discussions with consultants, evaluation of patient's response to treatment, examination of patient, obtaining history from patient or surrogate, ordering and performing treatments and interventions, ordering and review of laboratory studies, ordering and review of radiographic studies, pulse oximetry and re-evaluation of patient's condition.  Labs Review Labs Reviewed  CBC - Abnormal; Notable for the following:    WBC 13.6 (*)    All other components within normal limits  APTT  PROTIME-INR  COMPREHENSIVE METABOLIC PANEL  TROPONIN I  TROPONIN I      Date: 11/20/2013 0223am  Rate: 57  Rhythm: sinus bradycardia  QRS Axis: normal  Intervals: normal  ST/T Wave abnormalities: ST elevations inferiorly and ST depressions laterally  Conduction Disutrbances:none  Narrative Interpretation:   Old EKG Reviewed: none available at time of interpretation    2:39 AM Pt seen on arrival He reported  CP, he was ill appearing and very diaphoretic EKG shows STEMI.  suspect inferior wall MI.  He may have posterior involvement as well D/w Dr Herbie Baltimore at [facility] Patient will be transferred to higher level of care for cardiac catheterization 2:46 AM NTG deferred to low blood pressure and concern for RV involvement ASA/heparin has been given IV fluids have been given EMS here to transport patient He is resting comfortably but easily arousable.  He is hemodynamically stable at this time  MDM   Final diagnoses:  ST elevation myocardial infarction (STEMI), unspecified artery    Nursing notes including past medical history and social history reviewed and considered in documentation Labs/vital reviewed and considered     Joya Gaskins, MD 11/20/13 [phone]

## 2013-11-20 NOTE — Progress Notes (Signed)
Echocardiogram 2D Echocardiogram has been performed.  Reginald Fernandez 11/20/2013, 1:35 PM

## 2013-11-20 NOTE — Progress Notes (Signed)
Patient having frequent bursts of VT (around 5-6 beats) with the most recent being 16 beats.  Patient resting comfortably.  Dr. Katrinka Blazing notified.  Will continue to monitor. [place], Mitzi Hansen

## 2013-11-20 NOTE — ED Notes (Signed)
4000 unit bolus given

## 2013-11-20 NOTE — Progress Notes (Addendum)
       Patient Name: Reginald Fernandez Date of Encounter: 11/20/2013    SUBJECTIVE: He feels better but is having a terrible headache related to nitroglycerin use IV. No chest discomfort currently. He denies dyspnea. Having intermittent nausea.  TELEMETRY:  Normal sinus rhythm with intermittent AIVR up to 8 beats. Filed Vitals:   11/20/13 1030 11/20/13 1100 11/20/13 1130 11/20/13 1200  BP: 146/93 152/99 164/117 145/101  Pulse: 77 82 71 73  Temp:   98.4 F (36.9 C)   TempSrc:   Oral   Resp: 19 15 19 20   Height:      Weight:      SpO2: 99% 100% 97% 98%    Intake/Output Summary (Last 24 hours) at 11/20/13 1237 Last data filed at 11/20/13 1200  Gross per 24 hour  Intake 784.17 ml  Output   1450 ml  Net -665.83 ml   LABS: Basic Metabolic Panel:  Recent Labs  02/06/61 0220 11/20/13 0605  NA 139  --   K 4.2  --   CL 100  --   CO2 26  --   GLUCOSE 188*  --   BUN 17  --   CREATININE 0.80 0.61  CALCIUM 9.5  --    CBC:  Recent Labs  11/20/13 0220 11/20/13 0605  WBC 13.6* 12.5*  HGB 16.2 17.3*  HCT 46.4 49.2  MCV 88.9 88.8  PLT 261 224   Cardiac Enzymes:  Recent Labs  11/20/13 0220 11/20/13 0605 11/20/13 1113  TROPONINI <0.30 >20.00* >20.00*     Radiology/Studies:  Chest x-ray 11/20/13  IMPRESSION:  Enlarged cardiac silhouette. Central vascular congestion.  Peribronchial thickening and interstitial prominence may reflect  edema or bronchitis.   Physical Exam: Blood pressure 145/101, pulse 73, temperature 98.4 F (36.9 C), temperature source Oral, resp. rate 20, height 6\' 2"  (1.88 m), weight 277 lb 12.5 oz (126 kg), SpO2 98.00%. Weight change:   Wt Readings from Last 3 Encounters:  11/20/13 277 lb 12.5 oz (126 kg)  11/20/13 277 lb 12.5 oz (126 kg)  10/24/09 268 lb (121.564 kg)    Lying in bed with his eyes closed. Appears to be uncomfortable he says related to headache Unable to judge for neck vein elevation due to beard and obesity Basal rales  are heard on pulmonary exam A loud S4 is heard on auscultation. No murmur or rub Abdomen is soft Extremities reveal no edema. The radial cath site is unremarkable.  ASSESSMENT:  1. Acute inferior myocardial infarction treated with drug-eluting stent 2. Left ventricular function not assessed a cath but LVEDP was elevated at 30 mm mercury 3. Obesity 4. Poorly controlled hypertension  5. Ventricular ectopy, AIVR, likely secondary to reperfusion  Plan:  1. Wean and DC nitroglycerin 2. Beta blocker therapy 3. Diuretic therapy and decrease IV fluid rate 4. Check hemoglobin A1c 5. Will probably need ACE inhibitor therapy titrated upward   6. Ventricular ectopy, no plan for therapy other than beta blocker therapy unless it becomes sustained or hemodynamically compromising  7. 2-D Doppler echocardiogram to establish LV function  8. Diuresis as needed. 40 mg of IV Lasix has been given earlier today.  Reginald Fernandez 11/20/2013, 12:37 PM

## 2013-11-20 NOTE — ED Notes (Signed)
Pt c/o center chest pain x20 mins, hx of MI

## 2013-11-20 NOTE — Progress Notes (Signed)
Right radial TR band removed; site level 0 and radial pulses equal/+3.  Transparent occlusive dressing applied.  Will continue to monitor. [place], Mitzi Hansen

## 2013-11-20 NOTE — H&P (Addendum)
History and Physical Note:   NAME:  Reginald Fernandez   MRN: [id number] DOB:  [birth year]   ADMIT DATE: 11/20/2013  11/20/2013 2:55 AM  Reginald Fernandez is a 33 y.o. male with PMH of INF MI by EKG, but non-obstructive CAD on cath in 09/2009.  No [place] records since fall 2011. [facility] ~0215 this AM (11/20/2013) roughly 30 minutes after onset of acute left-sided chest pain/pressure in the substernal area. It was severe in intensity 8-10 out of 10. It came on suddenly. It has been constant since onset. It has been getting progressively worse. He associated symptoms of dyspnea, vomiting and nausea. Also diaphoresis.  He has otherwise been in his usual state of health. Said yesterday afternoon he had a headache and took some pain medications. Since he had no relief, he went out to "smoke a joint.  He denies cocaine use.  He previously quit smoking, but restarted.  The remainder of Cardiovascular ROS: positive for - chest pain, dyspnea on exertion, palpitations and shortness of breath negative for - edema, murmur, orthopnea, paroxysmal nocturnal dyspnea, rapid heart rate or near syncope, TIA/Amaurosis fugax:  At [facility] & via EMS - EKG with clear Inferior (Posterior) STEMI -- ASA 324, IV Heparin 4000, 8 mg IV Morphine, 500 ml NS  Past Medical History  Diagnosis Date  . H/o MI (myocardial infarction) 09/2009    EKG with Inf STEMI - no obstructive CAD  . Migraine   . Essential hypertension   . Hyperlipidemia with target LDL less than 100    Past Surgical History  Procedure Laterality Date  . Cardiac catheterization  09/2009    Mild RCA luminal Irregularities - MIld INf HK -- Med Rx   FAMHx: Family History  Problem Relation Age of Onset  . Heart attack Father 73    2 MIs by age 35 -- CABG  . Hypertension Mother   . Hypertension Father   . Heart failure Father   . Hyperlipidemia Father   . Heart failure Father    SOCHx:  reports that he quit smoking about 4 years ago.  He does not have any smokeless tobacco history on file. He reports that he uses illicit drugs (Marijuana). He reports that he does not drink alcohol. Actually restarted smoking cigarettes as well.  ALLERGIES: Allergies  Allergen Reactions  . Penicillins    HOME MEDICATIONS: No current facility-administered medications on file prior to encounter.   No current outpatient prescriptions on file prior to encounter.   Review of Systems  Constitutional: Positive for diaphoresis.  Eyes: Negative for blurred vision and double vision.  Respiratory: Positive for shortness of breath.   Cardiovascular: Positive for chest pain. Negative for orthopnea, claudication, leg swelling and PND.  Gastrointestinal: Positive for nausea and vomiting. Negative for blood in stool and melena.  Genitourinary: Negative.  Negative for frequency and hematuria.  Musculoskeletal: Negative.   Neurological: Positive for dizziness and headaches. Negative for tingling, tremors, sensory change, speech change, focal weakness, seizures and loss of consciousness.  Endo/Heme/Allergies: Bruises/bleeds easily.  Psychiatric/Behavioral: Negative.   All other systems reviewed and are negative.   PHYSICAL EXAM:Blood pressure 113/76, pulse 67, resp. rate 16, SpO2 99.00%. CONSTITUTIONAL: ill appearing, diaphoretic HEAD: Normocephalic/atraumatic, EOMI/PERRL, ENMT: Mucous membranes moist  NECK: supple no meningeal signs  CV: RRR, Normal S1/S2 noted, no murmurs/rubs/gallops noted  LUNGS: CTAB, no apparent distress  ABDOMEN: soft, nontender, no rebound or guarding  GU:no cva tenderness  NEURO: Drowsy, but arousable.  CN grossly intact, moves all 4 extremities.  EXTREMITIES: pulses normal/equal, full ROM, no LE edema or calf tenderness noted  SKIN: warm, color normal  PSYCH: Normal mood & affect; anxious.  Labs from [facility] reviewed.   Adult ECG Report  Rate: 57 ;  Rhythm: sinus bradycardia; normal axis & voltage  Other  Abnormalities: ~4 mm STE II, III, aVF, V6; ~1-2 mm V4-5 with reciprocal ST depression in aVL, V1, V2 -- Inferior STE (Injury Pattern) , CRO Posterior MI  IMPRESSION & PLAN  QUARTEZ BRADEEN has presented today for surgery, with the diagnosis of ACUTE INFERIOR (POSTERIOR) STEMI The various methods of treatment have been discussed with the patient and family.   Risks / Complications include, but not limited to: Death, MI, CVA/TIA, VF/VT (with defibrillation), Bradycardia (need for temporary pacer placement), contrast induced nephropathy, bleeding / bruising / hematoma / pseudoaneurysm, vascular or coronary injury (with possible emergent CT or Vascular Surgery), adverse medication reactions, infection.    After consideration of risks, benefits and other options for treatment, the patient has consented to Procedure(s):  LEFT HEART CATHETERIZATION AND CORONARY ANGIOGRAPHY +/- AD HOC PERCUTANEOUS CORONARY INTERVENTION  as a surgical intervention.   We will proceed with the planned procedure.   Hester [facility]lin [facility] [address] [place], [place]  [address]  [phone]  11/20/2013 2:55 AM

## 2013-11-20 NOTE — CV Procedure (Addendum)
CARDIAC CATHETERIZATION PERCUTANEOUS CORONARY INTERVENTION REPORT  NAME:  Reginald Fernandez   MRN: [id number] DOB:  [birth year]   ADMIT DATE: 11/20/2013 Procedure Date: 11/20/2013  INTERVENTIONAL CARDIOLOGIST: Leonie Man, M.D., MS PRIMARY CARE PROVIDER: Richardson Dopp, PA-C PRIMARY CARDIOLOGIST: New to [facility]  PATIENT:  Reginald Fernandez is a 33 y.o. male with PMH of INF MI by EKG, but non-obstructive CAD on cath in 09/2009. No [place] records since fall 2011.  He [facility] ~0215 this AM (11/20/2013) roughly 30 minutes after onset of acute left-sided chest pain/pressure in the substernal area. It was severe in intensity 8-10 out of 10. It came on suddenly. It has been constant since onset. It has been getting progressively worse. He associated symptoms of dyspnea, vomiting and nausea. Also diaphoresis.   At [facility] & via EMS - EKG with clear Inferolateral (Posterior) STEMI -- ASA 324, IV Heparin 4000, 8 mg IV Morphine, 500 ml NS. He arrived to [facility] Cath lab ~0315.   PRE-OPERATIVE DIAGNOSIS:   Principal Problem:   ST elevation myocardial infarction (STEMI) of inferolateral wall, initial episode of care Active Problems:   ST elevation myocardial infarction (STEMI) of true posterior wall, initial episode of care   Essential hypertension   Hyperlipidemia with target LDL less than 100  PROCEDURES PERFORMED:    Left Heart Catheterization with Native Coronary Angiography via Right Radial Artery   PERCUTANEOUS CORONARY INTERVENTION ON OM1 100% - PROMUS PREMIER DES 3.5 MM X 38 MM (post-dilated to 3.75 mm)  PERCUTANEOUS CORONARY INTERVENTION OF MID & DISTAL RCA: 3.0 MM X 20 MM, 3.0 MM X 16 MM (post-dilated to ~3.3 mm)  PROCEDURE: The patient was brought to the 2nd Floor [facility] in the fasting state and prepped and draped in the usual sterile fashion for Right Radial  artery access. A modified Allen's test was performed on the Right wrist  demonstrating excellent collateral flow for radial access.   Sterile technique was used including antiseptics, cap, gloves, gown, hand hygiene, mask and sheet. Skin prep: Chlorhexidine.   Consent: Risks of procedure as well as the alternatives and risks of each were explained to the (patient/caregiver). Consent for procedure obtained.   Time Out: Verified patient identification, verified procedure, site/side was marked, verified correct patient position, special equipment/implants available, medications/allergies/relevent history reviewed, required imaging and test results available. Performed.  Access:   Right Radial  Artery: 6 Fr Sheath -  Seldinger Technique (Angiocath Micropuncture Kit)  Radial Cocktail - 10 mL; IV Angiomax  Left Heart Catheterization: 5Fr Catheters advanced or exchanged over a Long Exchange Safety J-wire; TIG 4.0 catheter advanced first.  Right Coronary Artery Cineangiography: TIG 4.0 Catheter  Left Coronary Artery Cineangiography: JL 3.5 Catheter   LV Hemodynamics: TIG 4.0  Sheath removed in the Cardiac Cath Lab with manual pressure for hemostasis.  TR Band: 0445  Hours; 15 mL air  FINDINGS:  Hemodynamics:   Central Aortic Pressure / Mean: 146/109/127 mmHg  Left Ventricular Pressure / LVEDP: 144/19/30 mmHg  Left Ventriculography: Deferred  Coronary Anatomy:  Dominance: Right  Left Main: Large caliber vessel that bifurcates into the LAD and Circumflex. Initial images suggested the possibility was slight prominence in the left main however more forceful injection revealed that this was just streaming.  LAD: Large caliber vessel with a eccentric 20% stenosis after a large septal perforator a small first diagonal. The vessel then continues with minimal irregularities down and around the wrap the  apex. It perfuses the distal inferoapex.  D1: Small caliber, diffusely diseased vessel.  D2: Large large caliber vessel that bifurcates distally.  Left Circumflex:  Large caliber vessel that courses into the AV groove with a Proximal OM1 that lesion is 100% occluded. There is a focal ectatic/aneurysmal segment in the distal AV groove prior to bifurcation into 2 small posterolateral branches. More forceful injections revealed a retrograde filling of what amounts to be a very large caliber inferolateral OM1.  OM1: 100% proximally occluded as noted above.   RCA: Large caliber vessel with diffuse mild luminal irregularities but tandem lesions one mid & 1 distal. The more mid lesion is a focal ulcerated 95-99% near subtotal occlusion. The more distal lesion is also ulcerated and thrombotic appearing, but only ~80% . Beyond the second lesion the vessel terminates as a moderate to large caliber posterior descending artery PDA. into a large caliber LAD in several   right posterolateral branches. Besides the 2 focal ulcerated sure thrombotic appearing lesions, there is only minimal irregularities.   After reviewing the initial angiography,  several potential culprit lesions were identified. On first look, was unclear how large the occluded OM would become, and consideration was made to simply intervene on the RCA lesions. However after discussing with the patient, the decision was made to proceed with PCI of obtuse marginal..  Preparation were made to proceed with PCI on this lesion.  Percutaneous Coronary Intervention:  Sheath exchanged for 6 Fr  LESION #1: 100% prox OM1 --> reduced to 0%; TIMI 0 to TIMI 3 (with exception of side branch with TIMI 2 flow after ostial PTCA)  Guide: 6 Fr    XB LAD 3.5  Guidewire:  [place] Predilation Balloon:  Euphora 2.0  mm x 12  mm;   2 inflations at 8  Atm x 20 Sec,  Following the initial balloon inflation, it became apparent that the vessel was actually a quite large caliber vessel leading to essentially the ostium or takeoff of the AV groove branch.  Stent:  Promus Premier DES 3.5  mm x 38  mm; placing the stent actually  required crossing the AV groove takeoff as it now became apparent that this marginal branch would more likely be considered the parent vessel.  Maximum inflation: 16  Atm x 30  Sec,  Final Diameter: 3.75 mm  After stent deployment it became apparent that there was a side branch involved in the occluded segment of the OM. This was easily wired with a plan to perform angioplasty through the stent struts and into the proximal vessel that is now almost entirely occluded. Predilation Balloon:  Euphora 2.0  mm x 12  mm; Advanced through the stent struts into the lateral branch of the major lateral OM.    10 Atm x  30  Sec  Post deployment angiography in multiple views, with and without guidewire in place revealed excellent stent deployment and lesion coverage.  There was no evidence of dissection or perforation. the side branch now had TIMI 2 flow as opposed to do pre-angioplasty TIMI 0 flow.  Reperfusion rhythm with PVCs & AIVR.  However due to persistent Inferior STE on monitor & ongoing Chest Pain, the decision was made to proceed with PCI of the tandem thrombotic/ulcerated lesions in the mid & distal RCA.   LESIONS #2 (distal RCA 80%) & # 3 (mid RCA 95%):  Guide: 6 Fr    JR 4  Guidewire:  [place] Predilation Balloon:  Euphora 2.0  mm  x 12  mm;   2 inflations at 10  Atm x 30  Sec on each lesion Stent#2: Promus Premier DES 3.0 mm x 20 mm;   [place]on inflation 18 Atm x 40 Sec - diameter 3.1 mm in Post-dilation Balloon: [place] Emerge 3.25 mm x 12 mm;   3 inflations at 16 Atm x roughly 40 Sec,   Final Diameter: 3.3 mm Stent#3: Promus Premier DES 3.0 mm x 20 mm;    Maxon inflation 16 Atm x 40 Sec - diameter 3.1 mm Post-dilation Balloon: [place] Emerge 3.25 mm x 12 mm;   3 inflations at 16 Atm x roughly 40 Sec,   Final Diameter: 3.3 mm  Post deployment angiography in multiple views, with and without guidewire in place revealed excellent stent deployment and lesion coverage.  There was no evidence  of dissection or perforation. the side branch now had TIMI 2 flow as opposed to do pre-angioplasty TIMI 0 flow.  MEDICATIONS:  Anesthesia:  Local Lidocaine 2 ml  Sedation:  None Premedication: 8 mg IV morphine in addition to: Rx taken prior to arrival.  Omnipaque Contrast: 270 ml  Anticoagulation:  Angiomax Bolus & drip  Anti-Platelet Agent:  Aggrastat bolus and drip to run for 6 hours; Effient 60 mg daily  Intracoronary Nitroglycerin: total of 500 units Radial Cocktail: 5 mg Verapamil, 400 mcg NTG, 2 ml 2% Lidocaine in 10 ml NS Effient 60 mh Aggrestat - at higher dose rate. The plan would be to continue for hoursr  PATIENT DISPOSITION:    The patient was transferred to the PACU holding area in a hemodynamicaly stable, chest pain free condition.  The patient tolerated the procedure well, and there were no complications.  EBL:   < 10 ml  The patient was stable before, during, and after the procedure.  POST-OPERATIVE DIAGNOSIS:    Severe multivessel disease involving a large lateral OM branch as well as tandem severe lesions in the RCA.  Successful multivessel PCI on both OM 2 and for staff services.  Severely elevated LVEDP.  PLAN OF CARE:  Admit to CCU. Would not fast-track. Standard post cardiac catheterization care  Aggrastat for 6 hours.  Dual echo therapy for a minimum of one year.  Aggressive risk factor modification. We'll start carvedilol 6.25 mg twice a day along with ACE inhibitor and statin.  Smoking cessation counseling  Check transthoracic echocardiogram to assess cardiac function.   Leonie Man, M.D., M.S. [facility] [address] [place], [place]  [address]  [phone]  11/20/2013 4:52 AM

## 2013-11-20 NOTE — ED Notes (Signed)
Pt transported to [facility] via [facility].  Report given to Charge RN in ED

## 2013-11-20 NOTE — Progress Notes (Addendum)
MEDICATION RELATED CONSULT NOTE - INITIAL   Pharmacy Consult for Tirofiban Indication: PCI  Allergies  Allergen Reactions  . Penicillins     Patient Measurements: Height: 6\' 2"  (188 cm) Weight: 277 lb 12.5 oz (126 kg) IBW/kg (Calculated) : 82.2  Vital Signs: BP: 165/112 mmHg (09/04 0526) Pulse Rate: 86 (09/04 0526) Intake/Output from previous day:   Intake/Output from this shift:    Labs:  Recent Labs  11/20/13 0220  WBC 13.6*  HGB 16.2  HCT 46.4  PLT 261  APTT 26  CREATININE 0.80  ALBUMIN 4.1  PROT 8.0  AST 13  ALT 16  ALKPHOS 85  BILITOT 0.8   Estimated Creatinine Clearance: 185.2 ml/min (by C-G formula based on Cr of 0.8).   Microbiology: No results found for this or any previous visit (from the past 720 hour(s)).  Medical History: Past Medical History  Diagnosis Date  . H/o MI (myocardial infarction) 09/2009    EKG with Inf STEMI - no obstructive CAD  . Migraine   . Essential hypertension   . Hyperlipidemia with target LDL less than 100     Medications:  No prescriptions prior to admission    Assessment: 34 y.o. male admitted with STEMI. S/p cath with PCI. To continue tirofiban for 18 hours. CBC stable at baseline. CrCl > 100 ml/min.  Goal of Therapy: Platelet aggregation inhibition  Plan:  1) Tirofiban 0.32mcg/kg/min x 18 hours 2) Will check 8 hour plt count  Christoper Fabian, PharmD, BCPS Clinical pharmacist, pager [phone] 11/20/2013,5:30 AM  Patient's platelets were wnl (244) today @ 1430.  Drip to stop tonight at midnight.  Thank you for allowing pharmacy to be a part of this patient's care.  Cassie L. Roseanne Reno, PharmD Clinical Pharmacy Resident Pager: [phone] 11/20/2013 7:19 PM

## 2013-11-20 NOTE — ED Notes (Signed)
Xray at bedside for portable.

## 2013-11-21 DIAGNOSIS — Z9861 Coronary angioplasty status: Secondary | ICD-10-CM

## 2013-11-21 LAB — BASIC METABOLIC PANEL
ANION GAP: 13 (ref 5–15)
BUN: 10 mg/dL (ref 6–23)
CALCIUM: 8.8 mg/dL (ref 8.4–10.5)
CO2: 25 mEq/L (ref 19–32)
Chloride: 99 mEq/L (ref 96–112)
Creatinine, Ser: 0.7 mg/dL (ref 0.50–1.35)
GFR calc Af Amer: >90 mL/min (ref >90)
GFR calc non Af Amer: >90 mL/min (ref >90)
GLUCOSE: 116 mg/dL — AB (ref 70–99)
POTASSIUM: 3.9 meq/L (ref 3.7–5.3)
SODIUM: 137 meq/L (ref 137–147)

## 2013-11-21 LAB — CBC
HCT: 48.6 % (ref 39.0–52.0)
Hemoglobin: 17.2 g/dL — ABNORMAL HIGH (ref 13.0–17.0)
MCH: 31.2 pg (ref 26.0–34.0)
MCHC: 35.4 g/dL (ref 30.0–36.0)
MCV: 88.2 fL (ref 78.0–100.0)
Platelets: 229 10*3/uL (ref 150–400)
RBC: 5.51 MIL/uL (ref 4.22–5.81)
RDW: 13.2 % (ref 11.5–15.5)
WBC: 16.6 10*3/uL — AB (ref 4.0–10.5)

## 2013-11-21 LAB — TROPONIN I: Troponin I: >20 ng/mL (ref <0.30)

## 2013-11-21 MED ORDER — LISINOPRIL 5 MG PO TABS
5.0000 mg | ORAL_TABLET | Freq: Two times a day (BID) | ORAL | Status: DC
  Administered 2013-11-21 – 2013-11-23 (×4): 5 mg via ORAL
  Filled 2013-11-21 (×6): qty 1

## 2013-11-21 MED ORDER — ACTIVE PARTNERSHIP FOR HEALTH OF YOUR HEART BOOK
Freq: Once | Status: AC
  Administered 2013-11-21: 23:00:00
  Filled 2013-11-21: qty 1

## 2013-11-21 MED ORDER — CARVEDILOL 12.5 MG PO TABS
12.5000 mg | ORAL_TABLET | Freq: Two times a day (BID) | ORAL | Status: DC
  Administered 2013-11-21 – 2013-11-23 (×4): 12.5 mg via ORAL
  Filled 2013-11-21 (×6): qty 1

## 2013-11-21 NOTE — Progress Notes (Signed)
CARDIAC REHAB PHASE I   PRE:  Rate/Rhythm: 90 SR    BP: sitting 152/107    SaO2:   MODE:  Ambulation: 500 ft   POST:  Rate/Rhythm: 103 ST    BP: sitting 152/107     SaO2:   Tolerated well, no major c/o. Sts he still has a mild HA. BP very elevated, has been all day. No change in BP after the walk. Ed completed. Pt plans to quit smoking. Interested in CRPII. Will send to [place]. [phone]   Elissa Lovett Whiting CES, ACSM 11/21/2013 3:00 PM

## 2013-11-21 NOTE — Progress Notes (Addendum)
Pt's blood pressure elevated at rest, pt is asymptomatic, BP 180/118. Dr. Tresa Endo paged, 10mg  IV Hydralazine ordered.

## 2013-11-21 NOTE — Progress Notes (Signed)
SUBJECTIVE: The patient is recovering s/p inferolateral STEMI with PCI of OM1 and mid/ distal RCA  by Dr Herbie Baltimore 11/20/13  The patient is doing well today.  He has mild HA and chronic stable back pain.  At this time, he denies chest pain, shortness of breath, or any new concerns.  Marland Kitchen aspirin EC  81 mg Oral Daily  . atorvastatin  80 mg Oral q1800  . carvedilol  6.25 mg Oral BID WC  . heparin  5,000 Units Subcutaneous 3 times per day  . lisinopril  2.5 mg Oral BID  . pneumococcal 23 valent vaccine  0.5 mL Intramuscular Tomorrow-1000  . prasugrel  10 mg Oral Daily      OBJECTIVE: Physical Exam: Filed Vitals:   11/21/13 0700 11/21/13 0730 11/21/13 0800 11/21/13 0900  BP: 151/73  170/113 162/95  Pulse: 87  89 98  Temp:  99.1 F (37.3 C)    TempSrc:  Oral    Resp: Height:      Weight:      SpO2: 97%  96% 95%    Intake/Output Summary (Last 24 hours) at 11/21/13 0956 Last data filed at 11/21/13 0500  Gross per 24 hour  Intake 1228.25 ml  Output    575 ml  Net 653.25 ml    Telemetry reveals sinus rhythm with NSVT  GEN- The patient is well appearing, sleeping but rouses Head- normocephalic, atraumatic Eyes-  Sclera clear, conjunctiva pink Ears- hearing intact Oropharynx- clear Neck- supple, no JVP Lymph- no cervical lymphadenopathy Lungs- Clear to ausculation bilaterally, normal work of breathing Heart- Regular rate and rhythm, no murmurs, rubs or gallops, PMI not laterally displaced GI- soft, NT, ND, + BS Extremities- no clubbing, cyanosis, or edema Skin- no rash or lesion Psych- euthymic mood, full affect Neuro- strength and sensation are intact  LABS: Basic Metabolic Panel:  Recent Labs  [birth year] 0220 11/20/13 0605 11/21/13 0522  NA 139  --  137  K 4.2  --  3.9  CL 100  --  99  CO2 26  --  25  GLUCOSE 188*  --  116*  BUN 17  --  10  CREATININE 0.80 0.61 0.70  CALCIUM 9.5  --  8.8   Liver Function Tests:  Recent Labs  11/20/13 0220  AST  13  ALT 16  ALKPHOS 85  BILITOT 0.8  PROT 8.0  ALBUMIN 4.1   No results found for this basename: LIPASE, AMYLASE,  in the last 72 hours CBC:  Recent Labs  11/20/13 1428 11/21/13 0522  WBC 17.6* 16.6*  HGB 17.2* 17.2*  HCT 48.3 48.6  MCV 88.1 88.2  PLT 244 229   Cardiac Enzymes:  Recent Labs  11/20/13 1645 11/21/13 11/21/13 0522  TROPONINI >20.00* >20.00* >20.00*    RADIOLOGY: Dg Chest Portable 1 View  11/20/2013   CLINICAL DATA:  Chest pain, history of MI.  EXAM: PORTABLE CHEST - 1 VIEW  COMPARISON:  10/04/2009  FINDINGS: Enlarged cardiac silhouette. Central vascular congestion. Interstitial prominence and peribronchial thickening. No dense consolidation, pleural effusion, or pneumothorax. No acute osseous finding.  IMPRESSION: Enlarged cardiac silhouette.  Central vascular congestion.  Peribronchial thickening and interstitial prominence may reflect edema or bronchitis.   Electronically Signed   By: Jearld Lesch M.D.   On: 11/20/2013 02:47    ASSESSMENT AND PLAN:  Principal Problem:   ST elevation myocardial infarction (STEMI) of inferolateral wall, initial episode of care Active Problems:   Essential  hypertension   Hyperlipidemia with target LDL less than 100   ST elevation myocardial infarction (STEMI) of true posterior wall, initial episode of care   CAD S/P percutaneous coronary angioplasty: DES PCI -Cx-OM1, RCA x 2   AIVR (accelerated idioventricular rhythm) - Reperfusion arhythmia  1. Acute inferior myocardial infarction treated with drug-eluting stent  2. Left ventricular function preserved by Echo though LVEDP was elevated at cath 3. Obesity  4. Poorly controlled hypertension  5. Ventricular ectopy, improving  Plan:  1. Titrate coreg and ace inhibitor 2. Continue gentle diuresis 3. Check hemoglobin A1c  4. Transfer to telemetry 5. Cardiac rehab 6. Hopefully home in next 1-2 days     Hillis Range, MD 11/21/2013 9:56 AM

## 2013-11-22 LAB — HEMOGLOBIN A1C
Hgb A1c MFr Bld: 6 % — ABNORMAL HIGH (ref <5.7)
Mean Plasma Glucose: 126 mg/dL — ABNORMAL HIGH (ref <117)

## 2013-11-22 MED ORDER — TRAMADOL HCL 50 MG PO TABS
50.0000 mg | ORAL_TABLET | Freq: Four times a day (QID) | ORAL | Status: DC | PRN
  Administered 2013-11-22 – 2013-11-23 (×4): 50 mg via ORAL
  Filled 2013-11-22 (×4): qty 1

## 2013-11-22 NOTE — Progress Notes (Signed)
Subjective: No CP but is complaining of a headache.  Objective: Vital signs in last 24 hours: Temp:  [98.7 F (37.1 C)-99 F (37.2 C)] 98.7 F (37.1 C) (09/06 0541) Pulse Rate:  [86-99] 86 (09/06 0541) Resp:  [18-22] 18 (09/06 0541) BP: (123-170)/(75-113) 123/75 mmHg (09/06 0541) SpO2:  [95 %-99 %] 99 % (09/06 0541) Weight:  [240 lb (108.863 kg)-240 lb 4.8 oz (109 kg)] 240 lb 4.8 oz (109 kg) (09/06 0541) Last BM Date: 11/21/13  Intake/Output from previous day: 09/05 0701 - 09/06 0700 In: -  Out: 350 [Urine:350] Intake/Output this shift:    Medications Current Facility-Administered Medications  Medication Dose Route Frequency Provider Last Rate Last Dose  . acetaminophen (TYLENOL) tablet 650 mg  650 mg Oral Q4H PRN Marykay Lex, MD   650 mg at 11/21/13 2021  . aspirin EC tablet 81 mg  81 mg Oral Daily Chilton Si, MD   81 mg at 11/21/13 0910  . atorvastatin (LIPITOR) tablet 80 mg  80 mg Oral q1800 Chilton Si, MD   80 mg at 11/21/13 1806  . carvedilol (COREG) tablet 12.5 mg  12.5 mg Oral BID WC Hillis Range, MD   12.5 mg at 11/21/13 1806  . heparin injection 5,000 Units  5,000 Units Subcutaneous 3 times per day Marykay Lex, MD   5,000 Units at 11/22/13 [phone]  . lisinopril (PRINIVIL,ZESTRIL) tablet 5 mg  5 mg Oral BID Hillis Range, MD   5 mg at 11/21/13 1813  . morphine 2 MG/ML injection 2 mg  2 mg Intravenous Q1H PRN Marykay Lex, MD   2 mg at 11/20/13 2035  . ondansetron (ZOFRAN) injection 4 mg  4 mg Intravenous Q6H PRN Marykay Lex, MD   4 mg at 11/20/13 2037  . pneumococcal 23 valent vaccine (PNU-IMMUNE) injection 0.5 mL  0.5 mL Intramuscular Tomorrow-1000 Marykay Lex, MD      . prasugrel (EFFIENT) tablet 10 mg  10 mg Oral Daily Marykay Lex, MD   10 mg at 11/21/13 0910    PE: General appearance: alert, cooperative and no distress Lungs: clear to auscultation bilaterally Heart: regular rate and rhythm, S1, S2 normal, no murmur, click, rub  or gallop Extremities: No LEEE Pulses: 2+ and symmetric Skin: Warm and dry Neurologic: Grossly normal  Lab Results:   Recent Labs  11/20/13 0605 11/20/13 1428 11/21/13 0522  WBC 12.5* 17.6* 16.6*  HGB 17.3* 17.2* 17.2*  HCT 49.2 48.3 48.6  PLT 224 244 229   BMET  Recent Labs  11/20/13 0220 11/20/13 0605 11/21/13 0522  NA 139  --  137  K 4.2  --  3.9  CL 100  --  99  CO2 26  --  25  GLUCOSE 188*  --  116*  BUN 17  --  10  CREATININE 0.80 0.61 0.70  CALCIUM 9.5  --  8.8   PT/INR  Recent Labs  11/20/13 0220  LABPROT 13.7  INR 1.05    Assessment/Plan  Principal Problem:   ST elevation myocardial infarction (STEMI) of inferolateral wall, initial episode of care Active Problems:   Essential hypertension   Hyperlipidemia with target LDL less than 100   ST elevation myocardial infarction (STEMI) of true posterior wall, initial episode of care   CAD S/P percutaneous coronary angioplasty: DES PCI -Cx-OM1, RCA x 2   AIVR (accelerated idioventricular rhythm) - Reperfusion arhythmia    Plan:   SP emergent LHC revealing severe multivessel  disease involving a large lateral OM branch as well as tandem severe lesions in the RCA.  Successful multivessel PCI on both OM 2 and mid and distal RCA.  Severely elevated LVEDP.   Preserved EF by Echo.   ASA, Effient.   Ambulated well with cardiac rehab.   BP a lot better.  Coreg 12.5BID, lisinopril 5 BID.  A1C pending.    On statin.  Will check lipid panel in the morning.   Wife reports he stops breathing when he sleeps.  Will need sleep study.  Normal RV on echo.    We discussed lifestyle changes, in particular, eating habits.   Headache:  Tylenol works but it comes back when it wears off. Try tramadol PRN.   LOS: 2 days    HAGER, BRYAN PA-C 11/22/2013 7:30 AM  As above, patient seen and examined. He denies dyspnea or chest pain. Continue aspirin, effient, statin and beta blocker. Continue lisinopril. Blood pressure  better controlled. I discussed discontinuing tobacco use. Ambulate today. Discharge tomorrow morning if stable. Outpatient sleep study for possible sleep apnea. Olga Millers

## 2013-11-23 ENCOUNTER — Encounter (HOSPITAL_COMMUNITY): Payer: Self-pay | Admitting: Nurse Practitioner

## 2013-11-23 DIAGNOSIS — E66812 Obesity, class 2: Secondary | ICD-10-CM | POA: Diagnosis present

## 2013-11-23 DIAGNOSIS — IMO0001 Reserved for inherently not codable concepts without codable children: Secondary | ICD-10-CM | POA: Diagnosis present

## 2013-11-23 DIAGNOSIS — R0683 Snoring: Secondary | ICD-10-CM

## 2013-11-23 DIAGNOSIS — F1210 Cannabis abuse, uncomplicated: Secondary | ICD-10-CM | POA: Diagnosis present

## 2013-11-23 DIAGNOSIS — I472 Ventricular tachycardia: Secondary | ICD-10-CM

## 2013-11-23 DIAGNOSIS — I4729 Other ventricular tachycardia: Secondary | ICD-10-CM

## 2013-11-23 LAB — LIPID PANEL
CHOL/HDL RATIO: 7.2 ratio
Cholesterol: 209 mg/dL — ABNORMAL HIGH (ref 0–200)
HDL: 29 mg/dL — ABNORMAL LOW (ref >39)
LDL Cholesterol: 143 mg/dL — ABNORMAL HIGH (ref 0–99)
Triglycerides: 185 mg/dL — ABNORMAL HIGH (ref <150)
VLDL: 37 mg/dL (ref 0–40)

## 2013-11-23 MED ORDER — LISINOPRIL 5 MG PO TABS: 5.0000 mg | ORAL_TABLET | Freq: Two times a day (BID) | ORAL | Status: DC

## 2013-11-23 MED ORDER — BUPROPION HCL ER (SR) 150 MG PO TB12: ORAL_TABLET | ORAL | Status: DC

## 2013-11-23 MED ORDER — CARVEDILOL 12.5 MG PO TABS: 12.5000 mg | ORAL_TABLET | Freq: Two times a day (BID) | ORAL | Status: DC

## 2013-11-23 MED ORDER — PRASUGREL HCL 10 MG PO TABS: 10.0000 mg | ORAL_TABLET | Freq: Every day | ORAL | Status: DC

## 2013-11-23 MED ORDER — ATORVASTATIN CALCIUM 80 MG PO TABS: 80.0000 mg | ORAL_TABLET | Freq: Every day | ORAL | Status: DC

## 2013-11-23 MED ORDER — ASPIRIN 81 MG PO TBEC: 81.0000 mg | DELAYED_RELEASE_TABLET | Freq: Every day | ORAL | Status: DC

## 2013-11-23 MED ORDER — NITROGLYCERIN 0.4 MG SL SUBL: 0.4000 mg | SUBLINGUAL_TABLET | SUBLINGUAL | Status: DC | PRN

## 2013-11-23 NOTE — Discharge Summary (Signed)
Discharge Summary   Patient ID: Reginald Fernandez,  MRN: [id number], DOB/AGE: [birth year] 33 y.o.  Admit date: 11/20/2013 Discharge date: 11/23/2013  Primary Care Provider: No primary provider on file. Primary Cardiologist: Ranae Palms, MD  Discharge Diagnoses Principal Problem:   ST elevation myocardial infarction (STEMI) of inferolateral wall, initial episode of care Active Problems:   CAD S/P percutaneous coronary angioplasty: DES PCI -Cx-OM1, RCA x 2   Essential hypertension   AIVR (accelerated idioventricular rhythm) - Reperfusion arhythmia   Morbid obesity   Hyperlipidemia with target LDL less than 100   Marijuana abuse   NSVT (nonsustained ventricular tachycardia)   Snores  Allergies Allergies  Allergen Reactions  . Penicillins Other (See Comments)    unknown    Procedures  Cardiac Catheterization and Percutaneous Coronary Intervention 9.4.2015  Hemodynamics:    Central Aortic Pressure / Mean: 146/109/127 mmHg  Left Ventricular Pressure / LVEDP: 144/19/30 mmHg  Left Ventriculography: Deferred  Coronary Anatomy:  Dominance: Right  Left Main: Large caliber vessel that bifurcates into the LAD and Circumflex. Initial images suggested the possibility was slight prominence in the left main however more forceful injection revealed that this was just streaming.  LAD: Large caliber vessel with a eccentric 20% stenosis after a large septal perforator a small first diagonal. The vessel then continues with minimal irregularities down and around the wrap the apex. It perfuses the distal inferoapex.  D1: Small caliber, diffusely diseased vessel.  D2: Large large caliber vessel that bifurcates distally. Left Circumflex: Large caliber vessel that courses into the AV groove with 100% thrombotically occluded proximal OM1. There is a focal ectatic/aneurysmal segment in the distal AV groove prior to bifurcation into 2 small posterolateral branches. There is a stump of what appeared  to be an obtuse marginal branch coming off the mid AV groove circumflex. More forceful injections revealed a retrograde filling of what amounts to be a very large caliber inferolateral OM1.  OM1: 100% proximally occluded as noted above.  **The OM1 was successfully stented using a 3.5 x 38mm Promus Premier DES.**      RCA: Large caliber vessel with diffuse mild luminal irregularities but tandem lesions one mid & 1 distal. The more mid LAD lesion is a focal ulcerated 95-99% near subtotal occlusion. The more distal lesion is also ulcerated and thrombotic appearing, but only ~80% . Beyond the second lesion the vessel terminates as a moderate to large caliber posterior descending artery PDA. into a large caliber LAD in several   right posterolateral branches. Besides the 2 focal ulcerated sure thrombotic appearing lesions, there is only minimal irregularities.  **The mid RCA was successfully stented using a 3.0 x 20 mm Promus Premier DES.  The Distal RCA was successfully stented using a 3.0 x 16mm Promus Premier DES.** _____________   2D Echocardiogram 9.4.2015  Study Conclusions  - Left ventricle: The cavity size was normal. Systolic function was  normal. The estimated ejection fraction was in the range of 55%   to 60%. Wall motion was normal; there were no regional wall motion abnormalities. - Atrial septum: No defect or patent foramen ovale was identified. _____________   History of Present Illness  33 y/o male with a h/o inferior STEMI by ECG in 09/2009 with non-obstructive CAD by cath @ that time.  He also has a h/o obesity, HTN, HL, and snoring (no prior sleep study).  He was in his USOH until the early morning hours of 9/4, when he developed severe left-sided chest pain and  pressure associated with dyspnea, diaphoresis, nausea, and vomiting.  He presented to the [facility] and was found to have inferolateral ST elevation with deep ST depression and TWI in leads V1 and V2.  Code  STEMI was activated and he was taken to the [facility] cath lab for further eval.  Hospital Course  Patient underwent emergent diagnostic catheterization revealing severe RCA and OM1 disease.  Both areas were successfully treated using Promus Premier drug-eluting stents as outlined above.  Patient tolerated the procedure well and was monitored in the CCU post-PCI.  Troponin rose to > 20.  In the setting of ACS, he was noted to have runs of AIVR and also asymptomatic NSVT/ AIVR.  He also c/o dyspnea and was noted to have mild volume overload requiring gentle diuresis.  A Post MI Echocardiogram was performed showing normal LV function without regional wall motion abnormalities.  Mr. Dismuke was maintained on ASA, statin, beta blocker, ace inhibitor, Effient, and high-potency statin therapy.  The Beta Blocker & ACE-I were both up-titrated successfully.  Mr. Kauth was able to be transferred out to the floor and has been seen by cardiac rehab.  He has been ambulating without recurrent symptoms or limitations and is felt to be stable for discharge today.  We will arrange for follow-up within the next 7 days.    Discharge Vitals Blood pressure 131/88, pulse 77, temperature 98.3 F (36.8 C), temperature source Oral, resp. rate 18, height  (1.88 m), weight 240 lb 4.8 oz (109 kg), SpO2 100.00%.  Filed Weights   11/21/13 1122 11/22/13 0541 11/23/13 0620  Weight: 240 lb (108.863 kg) 240 lb 4.8 oz (109 kg) 240 lb 4.8 oz (109 kg)   Labs  CBC  Recent Labs  11/20/13 1428 11/21/13 0522  WBC 17.6* 16.6*  HGB 17.2* 17.2*  HCT 48.3 48.6  MCV 88.1 88.2  PLT 244 229   Basic Metabolic Panel  Recent Labs  11/21/13 0522  NA 137  K 3.9  CL 99  CO2 25  GLUCOSE 116*  BUN 10  CREATININE 0.70  CALCIUM 8.8   Liver Function Tests Lab Results  Component Value Date   ALT 16 11/20/2013   AST 13 11/20/2013   ALKPHOS 85 11/20/2013   BILITOT 0.8 11/20/2013    Cardiac Enzymes  Recent Labs  11/20/13 1645 11/21/13  11/21/13 0522  TROPONINI >20.00* >20.00* >20.00*   Hemoglobin A1C  Recent Labs  11/22/13 0309  HGBA1C 6.0*   Fasting Lipid Panel  Recent Labs  11/23/13 0030  CHOL 209*  HDL 29*  LDLCALC 143*  TRIG 185*  CHOLHDL 7.2   Disposition  Pt is being discharged home today in good condition.  Follow-up Plans & Appointments      Follow-up Information   Follow up with [facility]. ( We will arrange for f/u within the next week and contact you.)      Discharge Medications    Medication List    STOP taking these medications       IBUPROFEN PO      TAKE these medications       albuterol 108 (90 BASE) MCG/ACT inhaler  Commonly known as:  PROVENTIL HFA;VENTOLIN HFA  Inhale 2 puffs into the lungs every 6 (six) hours as needed for wheezing or shortness of breath.     aspirin 81 MG EC tablet  Take 1 tablet (81 mg total) by mouth daily.     atorvastatin 80 MG tablet  Commonly  known as:  LIPITOR  Take 1 tablet (80 mg total) by mouth daily at 6 PM.     buPROPion 150 MG 12 hr tablet  Commonly known as:  WELLBUTRIN SR  150mg  daily x 3 days then BID.     carvedilol 12.5 MG tablet  Commonly known as:  COREG  Take 1 tablet (12.5 mg total) by mouth 2 (two) times daily with a meal.     lisinopril 5 MG tablet  Commonly known as:  PRINIVIL,ZESTRIL  Take 1 tablet (5 mg total) by mouth 2 (two) times daily.     prasugrel 10 MG Tabs tablet  Commonly known as:  EFFIENT  Take 1 tablet (10 mg total) by mouth daily.     TYLENOL PO  Take 2 tablets by mouth 2 (two) times daily as needed (pain/ headache).        Outstanding Labs/Studies  Follow-up lipids/lft's in 8 wks (new statin). Should be set up for outpatient sleep study.  Duration of Discharge Encounter   Greater than 30 minutes including physician time.  Signed, Nicolasa Ducking NP 11/23/2013, 9:09 AM   The patient was seen today by Dr. Allyson Sabal. No further angina or dyspnea.  He was ready for discharge. I  agree with the summary as noted above.  Marykay Lex, M.D., M.S. Interventional Cardiologist   Pager # [phone] 11/23/2013

## 2013-11-23 NOTE — Progress Notes (Signed)
Subjective:  No CP/SOB  Objective:  Temp:  [98.3 F (36.8 C)-99.8 F (37.7 C)] 98.3 F (36.8 C) (09/07 0620) Pulse Rate:  [77-86] 77 (09/07 0620) Resp:  [18-19] 18 (09/07 0620) BP: (103-131)/(66-88) 131/88 mmHg (09/07 0620) SpO2:  [99 %-100 %] 100 % (09/07 0620) Weight:  [240 lb 4.8 oz (109 kg)] 240 lb 4.8 oz (109 kg) (09/07 0620) Weight change: 4.8 oz (0.137 kg)  Intake/Output from previous day:    Intake/Output from this shift: Total I/O In: 240 [P.O.:240] Out: -   Physical Exam: General appearance: alert and no distress Neck: no adenopathy, no carotid bruit, no JVD, supple, symmetrical, trachea midline and thyroid not enlarged, symmetric, no tenderness/mass/nodules Lungs: clear to auscultation bilaterally Heart: regular rate and rhythm, S1, S2 normal, no murmur, click, rub or gallop Extremities: Right radial arterial puncture site OK  Lab Results: Results for orders placed during the hospital encounter of 11/20/13 (from the past 48 hour(s))  HEMOGLOBIN A1C     Status: Abnormal   Collection Time    11/22/13  3:09 AM      Result Value Ref Range   Hemoglobin A1C 6.0 (*) <5.7 %   Comment: (NOTE)                                                                               According to the ADA Clinical Practice Recommendations for 2011, when     HbA1c is used as a screening test:      >=6.5%   Diagnostic of Diabetes Mellitus               (if abnormal result is confirmed)     5.7-6.4%   Increased risk of developing Diabetes Mellitus     References:Diagnosis and Classification of Diabetes Mellitus,Diabetes     Care,2011,34(Suppl 1):S62-S69 and Standards of Medical Care in             Diabetes - 2011,Diabetes Care,2011,34 (Suppl 1):S11-S61.   Mean Plasma Glucose 126 (*) <117 mg/dL   Comment: Performed at [facility]  LIPID PANEL     Status: Abnormal   Collection Time    11/23/13 12:30 AM      Result Value Ref Range   Cholesterol 209 (*) 0 - 200 mg/dL    Triglycerides 638 (*) <150 mg/dL   HDL 29 (*) >46 mg/dL   Total CHOL/HDL Ratio 7.2     VLDL 37  0 - 40 mg/dL   LDL Cholesterol 659 (*) 0 - 99 mg/dL   Comment:            Total Cholesterol/HDL:CHD Risk     Coronary Heart Disease Risk Table                         Men   Women      1/2 Average Risk   3.4   3.3      Average Risk       5.0   4.4      2 X Average Risk   9.6   7.1      3 X Average Risk  23.4  11.0                Use the calculated Patient Ratio     above and the CHD Risk Table     to determine the patient's CHD Risk.                ATP III CLASSIFICATION (LDL):      <100     mg/dL   Optimal      161-096  mg/dL   Near or Above                        Optimal      130-159  mg/dL   Borderline      045-409  mg/dL   High      >811     mg/dL   Very High    Imaging: Imaging results have been reviewed  Assessment/Plan:   1. Principal Problem: 2.   ST elevation myocardial infarction (STEMI) of inferolateral wall, initial episode of care 3. Active Problems: 4.   Essential hypertension 5.   Hyperlipidemia with target LDL less than 100 6.   CAD S/P percutaneous coronary angioplasty: DES PCI -Cx-OM1, RCA x 2 7.   AIVR (accelerated idioventricular rhythm) - Reperfusion arhythmia 8.   Marijuana abuse 9.   NSVT (nonsustained ventricular tachycardia) 10.   Morbid obesity 11.   Snores 12.   Time Spent Directly with Patient:  20 minutes  Length of Stay:  LOS: 3 days   POD #3 IP STEMI Rx with DES LCX and RCA. Trop > 20. LV fxn nl by 2D. No CP/SOB. Ambulated w/o difficulty. OK for D?C home on DAPT. Can titrate BB and ACE-I, diastolic BP still mildly increased and HR is as well. ROV with Dr. Herbie Baltimore 2-3 weeks.  Runell Gess 11/23/2013, 9:11 AM

## 2013-11-23 NOTE — Discharge Instructions (Signed)
**  PLEASE REMEMBER TO BRING ALL OF YOUR MEDICATIONS TO EACH OF YOUR FOLLOW-UP OFFICE VISITS. ° °NO HEAVY LIFTING X 4 WEEKS. °NO SEXUAL ACTIVITY X 4 WEEKS. °NO DRIVING X 2 WEEKS. °NO SOAKING BATHS, HOT TUBS, POOLS, ETC., X 7 DAYS. ° °Radial Site Care °Refer to this sheet in the next few weeks. These instructions provide you with information on caring for yourself after your procedure. Your caregiver may also give you more specific instructions. Your treatment has been planned according to current medical practices, but problems sometimes occur. Call your caregiver if you have any problems or questions after your procedure. °HOME CARE INSTRUCTIONS °· You may shower the day after the procedure. Remove the bandage (dressing) and gently wash the site with plain soap and water. Gently pat the site dry.  °· Do not apply powder or lotion to the site.  °· Do not submerge the affected site in water for 3 to 5 days.  °· Inspect the site at least twice daily.  °· Do not flex or bend the affected arm for 24 hours.  °· No lifting over 5 pounds (2.3 kg) for 5 days after your procedure.  °· Do not drive home if you are discharged the same day of the procedure. Have someone else drive you.  ° °What to expect: °· Any bruising will usually fade within 1 to 2 weeks.  °· Blood that collects in the tissue (hematoma) may be painful to the touch. It should usually decrease in size and tenderness within 1 to 2 weeks.  °SEEK IMMEDIATE MEDICAL CARE IF: °· You have unusual pain at the radial site.  °· You have redness, warmth, swelling, or pain at the radial site.  °· You have drainage (other than a small amount of blood on the dressing).  °· You have chills.  °· You have a fever or persistent symptoms for more than 72 hours.  °· You have a fever and your symptoms suddenly get worse.  °· Your arm becomes pale, cool, tingly, or numb.  °· You have heavy bleeding from the site. Hold pressure on the site.  ° °

## 2013-11-23 NOTE — Progress Notes (Signed)
Patient ID: Reginald Fernandez, male   DOB: 1980/06/07, 33 y.o.   MRN: [id number]    Subjective: No CP but is complaining of a headache. Nurse just gave tramadol  Objective: Vital signs in last 24 hours: Temp:  [98.3 F (36.8 C)-99.8 F (37.7 C)] 98.3 F (36.8 C) (09/07 0620) Pulse Rate:  [77-90] 77 (09/07 0620) Resp:  [18-19] 18 (09/07 0620) BP: (103-142)/(66-98) 131/88 mmHg (09/07 0620) SpO2:  [99 %-100 %] 100 % (09/07 0620) Weight:  [240 lb 4.8 oz (109 kg)] 240 lb 4.8 oz (109 kg) (09/07 0620) Last BM Date: 11/22/13  Intake/Output from previous day:   Intake/Output this shift:    Medications Current Facility-Administered Medications  Medication Dose Route Frequency Provider Last Rate Last Dose  . acetaminophen (TYLENOL) tablet 650 mg  650 mg Oral Q4H PRN Marykay Lex, MD   650 mg at 11/22/13 2038  . aspirin EC tablet 81 mg  81 mg Oral Daily Chilton Si, MD   81 mg at 11/22/13 0941  . atorvastatin (LIPITOR) tablet 80 mg  80 mg Oral q1800 Chilton Si, MD   80 mg at 11/22/13 1800  . carvedilol (COREG) tablet 12.5 mg  12.5 mg Oral BID WC Hillis Range, MD   12.5 mg at 11/22/13 1700  . heparin injection 5,000 Units  5,000 Units Subcutaneous 3 times per day Marykay Lex, MD   5,000 Units at 11/23/13 0636  . lisinopril (PRINIVIL,ZESTRIL) tablet 5 mg  5 mg Oral BID Hillis Range, MD   5 mg at 11/22/13 1800  . morphine 2 MG/ML injection 2 mg  2 mg Intravenous Q1H PRN Marykay Lex, MD   2 mg at 11/20/13 2035  . ondansetron (ZOFRAN) injection 4 mg  4 mg Intravenous Q6H PRN Marykay Lex, MD   4 mg at 11/20/13 2037  . pneumococcal 23 valent vaccine (PNU-IMMUNE) injection 0.5 mL  0.5 mL Intramuscular Tomorrow-1000 Marykay Lex, MD      . prasugrel (EFFIENT) tablet 10 mg  10 mg Oral Daily Marykay Lex, MD   10 mg at 11/22/13 0941  . traMADol (ULTRAM) tablet 50 mg  50 mg Oral Q6H PRN Wilburt Finlay, PA-C   50 mg at 11/23/13 0748    PE: General appearance: alert,  cooperative and no distress Lungs: clear to auscultation bilaterally Heart: regular rate and rhythm, S1, S2 normal, no murmur, click, rub or gallop Extremities: No LEEE Pulses: 2+ and symmetric Skin: Warm and dry Neurologic: Grossly normal Right radial sight A  Lab Results:   Recent Labs  11/20/13 1428 11/21/13 0522  WBC 17.6* 16.6*  HGB 17.2* 17.2*  HCT 48.3 48.6  PLT 244 229   BMET  Recent Labs  11/21/13 0522  NA 137  K 3.9  CL 99  CO2 25  GLUCOSE 116*  BUN 10  CREATININE 0.70  CALCIUM 8.8    Assessment/Plan  Principal Problem:   ST elevation myocardial infarction (STEMI) of inferolateral wall, initial episode of care Active Problems:   Essential hypertension   Hyperlipidemia with target LDL less than 100   ST elevation myocardial infarction (STEMI) of true posterior wall, initial episode of care   CAD S/P percutaneous coronary angioplasty: DES PCI -Cx-OM1, RCA x 2   AIVR (accelerated idioventricular rhythm) - Reperfusion arhythmia    Plan:   SP emergent LHC revealing severe multivessel disease involving a large lateral OM branch as well as tandem severe lesions in the RCA.  Successful multivessel  PCI on both OM 2 and mid and distal RCA.  Severely elevated LVEDP.   Preserved EF by Echo.   ASA, Effient.   Ambulated well with cardiac rehab.   BP a lot better.  Coreg 12.5BID, lisinopril 5 BID.  A1c 6.0  Low carb diet and weight loss appropriate    On statin.  Will check lipid panel in the morning.   Wife reports he stops breathing when he sleeps.  Will need sleep study.  Normal RV on echo.    We discussed lifestyle changes, in particular, eating habits.   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

## 2013-11-24 LAB — POCT ACTIVATED CLOTTING TIME: ACTIVATED CLOTTING TIME: 416 s

## 2013-11-24 MED FILL — Sodium Chloride IV Soln 0.9%: INTRAVENOUS | Qty: 50 | Status: AC

## 2013-11-27 ENCOUNTER — Telehealth: Payer: Self-pay | Admitting: *Deleted

## 2013-11-27 NOTE — Telephone Encounter (Signed)
FAXED -SIGNED ORDER FOR  [facility]  II-

## 2013-11-30 ENCOUNTER — Telehealth: Payer: Self-pay | Admitting: Cardiology

## 2013-12-03 ENCOUNTER — Telehealth (HOSPITAL_COMMUNITY): Payer: Self-pay | Admitting: *Deleted

## 2013-12-03 NOTE — Telephone Encounter (Signed)
Received signed MD order.  Pt called for sign up after follow up completed.  Pt requested to call back to rehab after appt completed.  Pt reminded to check for insurance benefits for cardiac rehab. Verbalized understanding.

## 2013-12-04 NOTE — Telephone Encounter (Signed)
Closed encounter °

## 2013-12-07 ENCOUNTER — Encounter: Payer: Self-pay | Admitting: Physician Assistant

## 2013-12-07 ENCOUNTER — Ambulatory Visit (INDEPENDENT_AMBULATORY_CARE_PROVIDER_SITE_OTHER): Payer: Managed Care, Other (non HMO) | Admitting: Physician Assistant

## 2013-12-07 VITALS — BP 133/98 | HR 86 | Ht 74.0 in | Wt 273.8 lb

## 2013-12-07 DIAGNOSIS — E785 Hyperlipidemia, unspecified: Secondary | ICD-10-CM

## 2013-12-07 DIAGNOSIS — I10 Essential (primary) hypertension: Secondary | ICD-10-CM

## 2013-12-07 DIAGNOSIS — Z79899 Other long term (current) drug therapy: Secondary | ICD-10-CM

## 2013-12-07 DIAGNOSIS — Z9861 Coronary angioplasty status: Secondary | ICD-10-CM

## 2013-12-07 DIAGNOSIS — R7309 Other abnormal glucose: Secondary | ICD-10-CM

## 2013-12-07 DIAGNOSIS — I2510 Atherosclerotic heart disease of native coronary artery without angina pectoris: Secondary | ICD-10-CM

## 2013-12-07 NOTE — Assessment & Plan Note (Signed)
I recommended nutrition counseling.  The contact information was provided.

## 2013-12-07 NOTE — Assessment & Plan Note (Signed)
On coreg 12.5 bid and lisinopril 5mg  bid.  No changes.

## 2013-12-07 NOTE — Patient Instructions (Signed)
1.  Cardiac rehab. 2.  Dietary changes as discussed.  Call Amy to set up a nutrition appt. 3.  Follow up with Dr. Herbie Baltimore in 3 months.

## 2013-12-07 NOTE — Assessment & Plan Note (Signed)
Nutrition counseling recommended.

## 2013-12-07 NOTE — Progress Notes (Signed)
Date:  12/07/2013   ID:  Reginald Fernandez, DOB April 29, 1980, MRN [id number]  PCP:  No primary provider on file.  Primary Cardiologist:  Herbie Baltimore     History of Present Illness: Reginald Fernandez is a 33 y.o. male with a h/o inferior STEMI by ECG in 09/2009 with non-obstructive CAD by cath @ that time. He also has a h/o obesity, HTN, HL, and snoring (no prior sleep study). He was in his USOH until the early morning hours of 9/4, when he developed severe left-sided chest pain and pressure associated with dyspnea, diaphoresis, nausea, and vomiting. He presented to the [facility] and was found to have inferolateral ST elevation with deep ST depression and TWI in leads V1 and V2. Code STEMI was activated and he was taken to the [facility] cath lab for further eval.   Patient underwent emergent diagnostic catheterization revealing severe RCA and OM1 disease. Both areas were successfully treated using Promus Premier drug-eluting stents as outlined above.  In the setting of ACS, he was noted to have runs of AIVR and also asymptomatic NSVT/ AIVR.  A Post MI Echocardiogram was performed showing normal LV function without regional wall motion abnormalities. Reginald Fernandez was started on ASA, statin, beta blocker, ace inhibitor, Effient, and high-potency statin therapy.   Patient presents today for post hospital evaluation. He reports doing well his weight appears to have gone up 33 pounds however, the hospital weight was not accurate.  That weight was a verbal way to the patient that given.  There is no signs of heart failure. No orthopnea, shortness of breath, PND, lower extremity edema. He denies nausea, vomiting, fever, chest pain, dizziness, cough, congestion, abdominal pain, hematochezia, melena, lower extremity edema, claudication.  Wt Readings from Last 3 Encounters:  12/07/13 273 lb 12.8 oz (124.195 kg)  11/23/13 240 lb 4.8 oz (109 kg)  11/23/13 240 lb 4.8 oz (109 kg)     Past Medical History    Diagnosis Date  . CAD (coronary artery disease)     a. 09/2009: EKG with Inf STEMI - no obstructive CAD;  b. 11/2013 Inflat STEMI/PCI: LM nl, LAD 20p, D1 sm - diff dzs, D2 large - nl, LCX 95-99, OM1 100 (3.5x38 Promus Premier DES), RCA 95-53m, 80d (3.0x20 and 3.0x16 Promus Premier DES');  c. 11/2013 Echo: Ef 55-60%, no rwma.   . Migraine   . Essential hypertension   . Hyperlipidemia with target LDL less than 100   . Marijuana abuse   . Morbid obesity     Current Outpatient Prescriptions  Medication Sig Dispense Refill  . Acetaminophen (TYLENOL PO) Take 2 tablets by mouth 2 (two) times daily as needed (pain/ headache).      Marland Kitchen albuterol (PROVENTIL HFA;VENTOLIN HFA) 108 (90 BASE) MCG/ACT inhaler Inhale 2 puffs into the lungs every 6 (six) hours as needed for wheezing or shortness of breath.      Marland Kitchen aspirin EC 81 MG EC tablet Take 1 tablet (81 mg total) by mouth daily.      Marland Kitchen atorvastatin (LIPITOR) 80 MG tablet Take 1 tablet (80 mg total) by mouth daily at 6 PM.  30 tablet  6  . buPROPion (WELLBUTRIN SR) 150 MG 12 hr tablet 150mg  daily x 3 days then BID.  60 tablet  2  . carvedilol (COREG) 12.5 MG tablet Take 1 tablet (12.5 mg total) by mouth 2 (two) times daily with a meal.  60 tablet  3  . lisinopril (  PRINIVIL,ZESTRIL) 5 MG tablet Take 1 tablet (5 mg total) by mouth 2 (two) times daily.  60 tablet  3  . nitroGLYCERIN (NITROSTAT) 0.4 MG SL tablet Place 1 tablet (0.4 mg total) under the tongue every 5 (five) minutes as needed for chest pain.  25 tablet  3  . prasugrel (EFFIENT) 10 MG TABS tablet Take 1 tablet (10 mg total) by mouth daily.  30 tablet  0   No current facility-administered medications for this visit.    Allergies:    Allergies  Allergen Reactions  . Penicillins Other (See Comments)    unknown    Social History:  The patient  reports that he quit smoking about 4 years ago. He does not have any smokeless tobacco history on file. He reports that he uses illicit drugs (Marijuana).  He reports that he does not drink alcohol.   Family history:   Family History  Problem Relation Age of Onset  . Heart attack Father 44    2 MIs by age 37 -- CABG  . Hypertension Mother   . Hypertension Father   . Heart failure Father   . Hyperlipidemia Father   . Heart failure Father     ROS:  Please see the history of present illness.  All other systems reviewed and negative.   PHYSICAL EXAM: VS:  BP 133/98  Pulse 86  Ht  (1.88 m)  Wt 273 lb 12.8 oz (124.195 kg)  BMI 35.14 kg/m2 Obese, well developed, in no acute distress HEENT: Pupils are equal round react to light accommodation extraocular movements are intact.  Neck: no JVDNo cervical lymphadenopathy. Cardiac: Regular rate and rhythm without murmurs rubs or gallops. Lungs:  clear to auscultation bilaterally, no wheezing, rhonchi or rales Ext: no lower extremity edema.  2+ radial and dorsalis pedis pulses. Skin: warm and dry Neuro:  Grossly normal  EKG:   Normal sinus rhythm, 83 beats per minute atrial enlargement, inferior Q waves    ASSESSMENT AND PLAN:  Problem List Items Addressed This Visit   Essential hypertension (Chronic)     On coreg 12.5 bid and lisinopril  bid.  No changes.    Hyperlipidemia with target LDL less than 100 (Chronic)     On statin.  We'll check LFTs and recheck lipids in 8 weeks.     HYPERGLYCEMIA     Nutrition counseling recommended.    CAD S/P percutaneous coronary angioplasty: DES PCI -Cx-OM1, RCA x 2     No Angina.  On ASA and effient.  Samples provided and patient assistance form filled out.   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  Phase two cardiac rehab.     Morbid obesity     I recommended nutrition counseling.  The contact information was provided.       Other Visit Diagnoses   Coronary artery disease involving native coronary artery of native heart without angina pectoris    -  Primary    Relevant Orders       EKG 12-Lead       AMB referral  to cardiac rehabilitation      Also noted: previous weight in the hospital is not accurate. It was a verbal weight given to the patient.  He has not gained 33 pounds.

## 2013-12-07 NOTE — Assessment & Plan Note (Signed)
No Angina.  On ASA and effient.  Samples provided and patient assistance form filled out.   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  Phase two cardiac rehab.

## 2013-12-07 NOTE — Assessment & Plan Note (Addendum)
On statin.  We'll check LFTs and recheck lipids in 8 weeks.

## 2013-12-09 ENCOUNTER — Telehealth: Payer: Self-pay | Admitting: Physician Assistant

## 2013-12-09 NOTE — Telephone Encounter (Signed)
Sent referral to cardiac rehab.

## 2013-12-10 ENCOUNTER — Telehealth (HOSPITAL_COMMUNITY): Payer: Self-pay | Admitting: *Deleted

## 2013-12-10 NOTE — Telephone Encounter (Signed)
Returning call for follow up after office visit on Monday.  Contact information left for pt to contact rehab staff to sign up for cardiac rehab. Daulton Harbaugh RN, BSN 

## 2013-12-10 NOTE — Telephone Encounter (Signed)
Returning call for follow up after office visit on Monday.  Contact information left for pt to contact rehab staff to sign up for cardiac rehab. Alanson Aly, BSN

## 2013-12-31 ENCOUNTER — Telehealth (HOSPITAL_COMMUNITY): Payer: Self-pay | Admitting: *Deleted

## 2013-12-31 NOTE — Telephone Encounter (Signed)
Have not received response from messages left on answering machine.  Please contact letter sent to pt to address listed in EPic.Alanson Aly, BSN

## 2014-01-14 ENCOUNTER — Telehealth (HOSPITAL_COMMUNITY): Payer: Self-pay | Admitting: *Deleted

## 2014-01-14 NOTE — Telephone Encounter (Signed)
Pt did not respond to multiple messages left on answering machine nor letter sent to home address. Assume pt is not interested.  Referral closed.  Carlette Carlton RN, BSN 

## 2014-01-15 ENCOUNTER — Telehealth: Payer: Self-pay | Admitting: Cardiology

## 2014-01-15 MED ORDER — PRASUGREL HCL 10 MG PO TABS: 10.0000 mg | ORAL_TABLET | Freq: Every day | ORAL | Status: DC

## 2014-01-15 NOTE — Telephone Encounter (Signed)
Pt called in stating that a prescription for Effient needs to be called in to the [facility]. Thanks  *it is a new script*

## 2014-01-15 NOTE — Telephone Encounter (Signed)
Spoke with pt, aware script sent to pharm 

## 2014-02-22 ENCOUNTER — Encounter: Payer: Self-pay | Admitting: Cardiology

## 2014-02-22 ENCOUNTER — Ambulatory Visit (INDEPENDENT_AMBULATORY_CARE_PROVIDER_SITE_OTHER): Payer: Managed Care, Other (non HMO) | Admitting: Cardiology

## 2014-02-22 VITALS — BP 122/78 | HR 77 | Ht 74.0 in | Wt 278.0 lb

## 2014-02-22 DIAGNOSIS — I2510 Atherosclerotic heart disease of native coronary artery without angina pectoris: Secondary | ICD-10-CM

## 2014-02-22 DIAGNOSIS — R400 Somnolence: Secondary | ICD-10-CM | POA: Insufficient documentation

## 2014-02-22 DIAGNOSIS — G4710 Hypersomnia, unspecified: Secondary | ICD-10-CM

## 2014-02-22 DIAGNOSIS — R0683 Snoring: Secondary | ICD-10-CM | POA: Insufficient documentation

## 2014-02-22 DIAGNOSIS — Z79899 Other long term (current) drug therapy: Secondary | ICD-10-CM

## 2014-02-22 DIAGNOSIS — E785 Hyperlipidemia, unspecified: Secondary | ICD-10-CM

## 2014-02-22 DIAGNOSIS — I10 Essential (primary) hypertension: Secondary | ICD-10-CM

## 2014-02-22 DIAGNOSIS — Z9861 Coronary angioplasty status: Secondary | ICD-10-CM

## 2014-02-22 NOTE — Progress Notes (Signed)
PCP: No PCP Per Patient  Clinic Note: Chief Complaint  Patient presents with  . Follow-up    pt has concerns of headaches has sob with over exertion  . Code STEMI    September 2015; inferior STEMI with PCI to OM1 & RCA  . Coronary Artery Disease  . Obesity    Possible OSA   HPI: Reginald Fernandez is a 33 y.o. male with a PMH below who presents today for his second post MI follow-up. He saw Wilburt FinlayBryan Hager, PA in September. He was referred to cardiac rehabilitation, but declined because of work issues. Overall he was doing well at that time. He suffered an inferior-inferolateral STEMI November 20, 2013.  [facility] with acute onset left-sided chest pressure 8-9 at 10 suddenly associated with dyspnea vomiting nausea and diaphoresis. Initial EKGs were not revealing, however the follow-up EKG was clearly consistent with inferior and possible posterior/lateral ST segment elevations. He was brought acutely to the cardiac catheterization lab where he was found to have severe disease in both the OM1 as well as RCA, both treated with Promus Premier DES stents.   Past Medical History  Diagnosis Date  . CAD S/P percutaneous coronary angioplasty 11/20/2013    a. 09/2009: EKG with Inf STEMI - no obstructive CAD;  b. 11/2013 Inflat STEMI/PCI: LM nl, LAD 20p, D1 sm - diff dzs, D2 large - nl, LCX 95-99, OM1 100 (3.5x38 Promus Premier DES), RCA 95-3765m, 80d (3.0x20 and 3.0x16 Promus Premier DES');  c. 11/2013 Echo: Ef 55-60%, no rwma.   . ST-segment elevation myocardial infarction (STEMI) of inferior wall 11/20/2013  . Essential hypertension   . Hyperlipidemia with target LDL less than 100   . Marijuana abuse   . Morbid obesity   . Migraine     Prior Cardiac Evaluation and Past Surgical History: Past Surgical History  Procedure Laterality Date  . Cardiac catheterization  09/2009; September 2014    a) Mild RCA luminal Irregularities - MIld INf HK -- Med Rx; b) Inf  STEMI: OM1 100%, m-dRCA 95-99% thrombotic mRCA with tandem 80% -- PCI, LAD & Cx relatively normal.  . Percutaneous coronary stent intervention (pci-s)  November 20, 2013    a) PCI - OM1 Promus Premier DES 3.5 mm x 38 mm, m-d RCA Promus Premier DES  3.0 mm x 20 mm & 3.0 mm x 16 mm   Interval History: He presents now months post MI very well with no active anginal symptoms or heart failure symptoms. He does note having chronic headaches 2 times a week usually worse when waking up. He initially was not having problems with spelling post discharge but is now gone back to normal. He denies any resting or exertional chest tightness or pressure. No PND or orthopnea. Trivial edema. There was recommendation for him to have a sleep study evaluation based on nighttime desaturations and significant snoring while inpatient.  While he may note intermittent palpitations, he denies any associated lightheadedness, dizziness, weakness, syncope/near syncope, or TIA/amaurosis fugax symptoms. He has mild nosebleeds, associated with his allergies and blowing his nose. He has had at least 2 were prolonged. He was able to successfully quit smoking during his MI hospitalization.   ROS: A comprehensive was performed. Review of Systems  Constitutional: Negative for malaise/fatigue.  HENT: Positive for nosebleeds.   Respiratory: Negative for shortness of breath and wheezing.        Significant nighttime snoring. He does have some daytime  fatigue.  Cardiovascular:       Per history of present illness  Gastrointestinal: Negative for blood in stool and melena.  Genitourinary: Positive for hematuria.  Musculoskeletal: Negative.   Neurological: Negative for dizziness, sensory change, speech change, focal weakness, seizures and loss of consciousness.  Endo/Heme/Allergies: Does not bruise/bleed easily.  Psychiatric/Behavioral: Negative for depression and memory loss. The patient is not nervous/anxious.   All other systems  reviewed and are negative.   Current Outpatient Prescriptions on File Prior to Visit  Medication Sig Dispense Refill  . Acetaminophen (TYLENOL PO) Take 2 tablets by mouth 2 (two) times daily as needed (pain/ headache).    Marland Kitchen albuterol (PROVENTIL HFA;VENTOLIN HFA) 108 (90 BASE) MCG/ACT inhaler Inhale 2 puffs into the lungs every 6 (six) hours as needed for wheezing or shortness of breath.    Marland Kitchen aspirin EC 81 MG EC tablet Take 1 tablet (81 mg total) by mouth daily.    Marland Kitchen atorvastatin (LIPITOR) 80 MG tablet Take 1 tablet (80 mg total) by mouth daily at 6 PM. 30 tablet 6  . buPROPion (WELLBUTRIN SR) 150 MG 12 hr tablet 150mg  daily x 3 days then BID. 60 tablet 2  . carvedilol (COREG) 12.5 MG tablet Take 1 tablet (12.5 mg total) by mouth 2 (two) times daily with a meal. 60 tablet 3  . lisinopril (PRINIVIL,ZESTRIL) 5 MG tablet Take 1 tablet (5 mg total) by mouth 2 (two) times daily. 60 tablet 3  . nitroGLYCERIN (NITROSTAT) 0.4 MG SL tablet Place 1 tablet (0.4 mg total) under the tongue every 5 (five) minutes as needed for chest pain. 25 tablet 3  . prasugrel (EFFIENT) 10 MG TABS tablet Take 1 tablet (10 mg total) by mouth daily. 30 tablet 6   No current facility-administered medications on file prior to visit.   ALLERGIES REVIEWED IN EPIC --no change SOCIAL AND FAMILY HISTORY REVIEWED IN EPIC --no change - he quit smoking in September 2015 -   Wt Readings from Last 3 Encounters:  02/22/14 278 lb (126.1 kg)  12/07/13 273 lb 12.8 oz (124.195 kg)  11/23/13 240 lb 4.8 oz (109 kg)   PHYSICAL EXAM BP 122/78 mmHg  Pulse 77  Ht 6\' 2"  (1.88 m)  Wt 278 lb (126.1 kg)  BMI 35.68 kg/m2 General appearance: alert, cooperative, appears stated age, no distress and moderately obese Neck: no adenopathy, no carotid bruit and no JVD Lungs: clear to auscultation bilaterally, normal percussion bilaterally and Nonlabored, good air movement Heart: regular rate and rhythm, S1, S2 normal, no murmur, click, rub or  gallop and normal apical impulse Abdomen: soft, non-tender; bowel sounds normal; no masses,  no organomegaly Extremities: extremities normal, atraumatic, no cyanosis or edema Pulses: 2+ and symmetric Neurologic: Alert and oriented X 3, normal strength and tone. Normal symmetric reflexes. Normal coordination and gait   Adult ECG Report  Rate: 77 ;  Rhythm: normal sinus rhythm and Axis -267 (right superior axis) - inferior MI, age indeterminate. also poor R-wave progression.  T-wave inversion still noted in II & aVF.  Narrative Interpretation:relatively stable EKG. The axis is different, but partly because of baseline wander.  Recent Labs:  None since discharge   ASSESSMENT / PLAN: CAD S/P percutaneous coronary angioplasty: DES PCI -Cx-OM1, RCA x 2 Stable - no recurrent angina. Back to work. Has Effient card. - continue ASA & Effient (oK to hold ASA for bad nosebleed) ON BB, ACE-I & statin.  ROV 6 months  Essential hypertension Stable on current meds.  No changes.  Daytime somnolence With Obesity & o/n desaturations on SaO2 monitor while inpatient + wife's reporting of severe snoring & apeneic spells - will order Polysomnography.  Morbid obesity The patient understands the need to lose weight with diet and exercise. We have discussed specific strategies for this.  Hyperlipidemia with target LDL less than 100 On high dose statin. Labs ordered for ~ March time frame -- would like to be able to reduce statin. (Lipids & CMP)    Orders Placed This Encounter  Procedures  . Comprehensive metabolic panel    Standing Status: Future     Number of Occurrences:      Standing Expiration Date: 02/23/2015  . Lipid panel    Standing Status: Future     Number of Occurrences:      Standing Expiration Date: 02/23/2015  . EKG 12-Lead  . Nocturnal polysomnography    Standing Status: Future     Number of Occurrences:      Standing Expiration Date: 02/23/2015    Scheduling Instructions:      For Dr Nicki Guadalajara to read.    Order Specific Question:  Where should this test be performed:    Answer:  [facility]   No orders of the defined types were placed in this encounter.     Followup:6 months    Marykay Lex, M.D., M.S. Interventional Cardiologist   Pager # [phone]

## 2014-02-22 NOTE — Patient Instructions (Signed)
Dr Herbie Baltimore has ordered the following test(s) to be done: 1. Blood work - to be done FASTING in March 2016  2. Sleep study - This test records several body functions during sleep, including: brain activity, eye movement, oxygen and carbon dioxide blood levels, heart rate and rhythm, breathing rate and rhythm, the flow of air through your mouth and nose, snoring, body muscle movements, and chest and belly movement.  Dr Herbie Baltimore has made no changes in your current medications or treatment plan  Your physician wants you to follow-up in 6 months. You will receive a reminder letter in the mail one months in advance. If you don't receive a letter, please call our office to schedule the follow-up appointment.

## 2014-02-23 ENCOUNTER — Encounter: Payer: Self-pay | Admitting: Cardiology

## 2014-02-23 NOTE — Assessment & Plan Note (Signed)
Stable on current meds. No changes 

## 2014-02-23 NOTE — Assessment & Plan Note (Signed)
With Obesity & o/n desaturations on SaO2 monitor while inpatient + wife's reporting of severe snoring & apeneic spells - will order Polysomnography.

## 2014-02-23 NOTE — Assessment & Plan Note (Signed)
The patient understands the need to lose weight with diet and exercise. We have discussed specific strategies for this.  

## 2014-02-23 NOTE — Assessment & Plan Note (Signed)
Stable - no recurrent angina. Back to work. Has Effient card. - continue ASA & Effient (oK to hold ASA for bad nosebleed) ON BB, ACE-I & statin.  ROV 6 months

## 2014-02-23 NOTE — Assessment & Plan Note (Addendum)
On high dose statin. Labs ordered for ~ March time frame -- would like to be able to reduce statin. (Lipids & CMP)

## 2014-02-25 ENCOUNTER — Encounter (HOSPITAL_COMMUNITY): Payer: Self-pay | Admitting: Cardiology

## 2014-03-22 ENCOUNTER — Telehealth: Payer: Self-pay | Admitting: Cardiology

## 2014-03-22 NOTE — Telephone Encounter (Signed)
Unable to reach pt or leave a message  

## 2014-03-22 NOTE — Telephone Encounter (Signed)
Spoke w/ pt, reported nasal congestion & sinus pressure x3-4 days. Also described nosebleeds for same duration, about 1 a day which resolved w/in 3-4 minutes.  States headaches in conjunction w/ congestion & sinus pressure, denies SOB, CP, any other cardiac symptoms.  He asked if he could stop taking any medications. Advised not to d/c effient, but that he could stop ASA 81mg  x1-2 days if it relieves epistaxis symptoms.  Also req med advice. Told that I could not prescribe anything but recommended Mucinex OTC for sinus relief. Did specify to avoid anything -D or -DM as these can interfere w/ certain cardiac medications.  Pt voiced understanding.

## 2014-03-22 NOTE — Telephone Encounter (Signed)
Please call,feel extremely tired,can not get up and go.Please call asap.

## 2014-03-30 ENCOUNTER — Telehealth: Payer: Self-pay | Admitting: Cardiology

## 2014-03-30 MED ORDER — LISINOPRIL 5 MG PO TABS: 5.0000 mg | ORAL_TABLET | Freq: Two times a day (BID) | ORAL | Status: DC

## 2014-03-30 NOTE — Telephone Encounter (Signed)
Refill submitted , pt informed .

## 2014-03-30 NOTE — Telephone Encounter (Signed)
Pt called in stating that he needs a refill on his Lisinopril called in to [facility]. Please call  Thanks

## 2014-05-10 ENCOUNTER — Other Ambulatory Visit: Payer: Self-pay

## 2014-05-10 DIAGNOSIS — I10 Essential (primary) hypertension: Secondary | ICD-10-CM

## 2014-05-10 DIAGNOSIS — E785 Hyperlipidemia, unspecified: Secondary | ICD-10-CM

## 2014-05-13 ENCOUNTER — Ambulatory Visit (HOSPITAL_BASED_OUTPATIENT_CLINIC_OR_DEPARTMENT_OTHER): Payer: Managed Care, Other (non HMO) | Attending: Cardiology | Admitting: Radiology

## 2014-05-13 VITALS — Ht 74.0 in | Wt 252.0 lb

## 2014-05-13 DIAGNOSIS — G4710 Hypersomnia, unspecified: Secondary | ICD-10-CM | POA: Diagnosis not present

## 2014-05-13 DIAGNOSIS — R0683 Snoring: Secondary | ICD-10-CM

## 2014-05-13 DIAGNOSIS — R400 Somnolence: Secondary | ICD-10-CM

## 2014-05-16 NOTE — Sleep Study (Signed)
NAME: Reginald Fernandez DATE OF BIRTH:  08-11-1980 MEDICAL RECORD NUMBER [id number]  LOCATION: [facility]  PHYSICIAN: Jonalyn Sedlak A  DATE OF STUDY: 05/13/2014  SLEEP STUDY TYPE: Nocturnal Polysomnogram               REFERRING PHYSICIAN: Marykay Lex, MD  INDICATION FOR STUDY:  [name] is a 34 year old male with established coronary artery disease and history of hypertension.  He is referred for sleep study to evaluate for potential sleep apnea in this patient with snoring, difficulty sleeping, and daytime sleepiness.  EPWORTH SLEEPINESS SCORE:  13 which is consistent with excessive daytime sleepiness. HEIGHT: 6\' 2"  (188 cm)  WEIGHT: 252 lb (114.306 kg)    Body mass index is 32.34 kg/(m^2).  NECK SIZE: 17 in.  MEDICATIONS:   prasugrel (EFFIENT) 10 MG TABS tablet 10 mg, Daily nitroGLYCERIN (NITROSTAT) 0.4 MG SL tablet 0.4 mg, Every 5 min PRN lisinopril (PRINIVIL,ZESTRIL) 5 MG tablet 5 mg, 2 times daily carvedilol (COREG) 12.5 MG tablet 12.5 mg, 2 times daily with meals buPROPion (WELLBUTRIN SR) 150 MG 12 hr tablet atorvastatin (LIPITOR) 80 MG tablet 80 mg, Daily-1800 aspirin EC 81 MG EC tablet 81 mg, Daily albuterol (PROVENTIL HFA;VENTOLIN HFA) 108 (90 BASE) MCG/ACT inhaler 2 puff, Every 6 hours PRN Acetaminophen (TYLENOL PO) 2 tablet  SLEEP ARCHITECTURE:  The patient slept for 366 minutes out of a sleep period of time of 397.5 minutes.  Sleep efficiency was 90.6%, which is normal.  He had prompt sleep onset at 6.5 minutes.  Latency to REM sleep was 120.5 minutes.  The patient spent 46.5 minutes (12.7%) in stage I, 233.5 minutes (63.8%) in stage II 0 minutes in stage III, and 86 minutes in REM sleep (23.5%).  He was able to sleep 43 minutes in the supine position, and 323 minutes nonsupine.  There were a total of 83 arousals during sleep time with an index of 13.6.  There was moderate to severe snoring.  RESPIRATORY DATA:  There were 0 obstructive apneas, 0 central  apneas, 0 mixed apneas, and 13 hypopneas during the sleep time. The apnea hypopnea index (AHI) was 2.1 per hour.  The respiratory disturbance index (RDI) was 7.4 per hour.  The AHI in REM sleep was 1.4 per hour.  OXYGEN DATA:  The baseline oxygen saturation was 92%.  The lowest oxygen desaturation was 89% with non-REM sleep and 90% with REM sleep.  CARDIAC DATA:  The patient was in sinus rhythm with an average heart rate at 66 bpm.  He did have an occasional PVC.  In Epic 725 there was a 3 beat and 8 beat salvo of nonsustained wide complex tachycardia.  MOVEMENT/PARASOMNIA:  There is 0.  I live movements  IMPRESSION/ RECOMMENDATION:   Increased upper airway resistance syndrome (UARS) without definitive sleep apnea. Moderate to severe snoring.   Abnormal sleep architecture with reduction in slow-wave sleep and slightly prolonged latency to REM sleep. No evidence for nocturnal myoclonus. The arousal index was abnormal.  The patient does not meet criteria for CPAP therapy. Consider alternatives for the treatment of snoring such as ENT evaluation or customized oral appliance. If the patient continues to experience hypersomnolence, consider scheduling the patient for an MSLT study to evaluate for possible idiopathic hypersomnolence or narcolepsy. The patient should be counseled on good sleep hygiene as well as weight loss.    Lennette Bihari Diplomate, American Board of Sleep Medicine  ELECTRONICALLY SIGNED ON:  05/16/2014, 1:44 PM [facility] PH: [phone]   FX: [phone] ACCREDITED BY THE AMERICAN ACADEMY OF SLEEP MEDICINE

## 2014-05-16 NOTE — Addendum Note (Signed)
Addended by: Nicki Guadalajara A on: 05/16/2014 02:01 PM   Modules accepted: Level of Service

## 2014-05-20 ENCOUNTER — Encounter: Payer: Self-pay | Admitting: Cardiovascular Disease

## 2014-07-05 ENCOUNTER — Other Ambulatory Visit: Payer: Self-pay | Admitting: Nurse Practitioner

## 2014-07-06 NOTE — Telephone Encounter (Signed)
Rx(s) sent to pharmacy electronically.  

## 2014-07-26 ENCOUNTER — Other Ambulatory Visit: Payer: Self-pay | Admitting: Nurse Practitioner

## 2014-07-26 NOTE — Telephone Encounter (Signed)
Rx(s) sent to pharmacy electronically.  

## 2014-09-09 ENCOUNTER — Encounter: Payer: Self-pay | Admitting: Cardiology

## 2014-09-09 ENCOUNTER — Ambulatory Visit (INDEPENDENT_AMBULATORY_CARE_PROVIDER_SITE_OTHER): Payer: Managed Care, Other (non HMO) | Admitting: Cardiology

## 2014-09-09 VITALS — BP 122/74 | HR 70 | Ht 73.0 in | Wt 267.0 lb

## 2014-09-09 DIAGNOSIS — I10 Essential (primary) hypertension: Secondary | ICD-10-CM | POA: Diagnosis not present

## 2014-09-09 DIAGNOSIS — R400 Somnolence: Secondary | ICD-10-CM

## 2014-09-09 DIAGNOSIS — Z79899 Other long term (current) drug therapy: Secondary | ICD-10-CM

## 2014-09-09 DIAGNOSIS — I2510 Atherosclerotic heart disease of native coronary artery without angina pectoris: Secondary | ICD-10-CM

## 2014-09-09 DIAGNOSIS — G4710 Hypersomnia, unspecified: Secondary | ICD-10-CM

## 2014-09-09 DIAGNOSIS — Z9861 Coronary angioplasty status: Secondary | ICD-10-CM

## 2014-09-09 DIAGNOSIS — E785 Hyperlipidemia, unspecified: Secondary | ICD-10-CM | POA: Diagnosis not present

## 2014-09-09 DIAGNOSIS — I252 Old myocardial infarction: Secondary | ICD-10-CM

## 2014-09-09 NOTE — Progress Notes (Signed)
PCP: No PCP Per Patient  Clinic Note: Chief Complaint  Patient presents with  . Follow-up    CAD, HTN, HLD, Obesity   HPI: Reginald Fernandez is a 34 y.o. male with a PMH below who presents today for his second post MI follow-up. He saw Wilburt Finlay, PA in September. He was referred to cardiac rehabilitation, but declined because of work issues. Overall he was doing well at that time. He suffered an inferior-inferolateral STEMI November 20, 2013.  He initially presented to [facility] ER with acute onset left-sided chest pressure 8-9 at 10 suddenly associated with dyspnea vomiting nausea and diaphoresis. Initial EKGs were not revealing, however the follow-up EKG was clearly consistent with inferior and possible posterior/lateral ST segment elevations. He was brought acutely to the cardiac catheterization lab where he was found to have severe disease in both the OM1 as well as RCA, both treated with Promus Premier DES stents.   Past Medical History  Diagnosis Date  . CAD S/P percutaneous coronary angioplasty 11/20/2013    a. 09/2009: EKG with Inf STEMI - no obstructive CAD;  b. 11/2013 Inflat STEMI/PCI: LM nl, LAD 20p, D1 sm - diff dzs, D2 large - nl, LCX 95-99, OM1 100 (3.5x38 Promus Premier DES), RCA 95-26m, 80d (3.0x20 and 3.0x16 Promus Premier DES');  c. 11/2013 Echo: Ef 55-60%, no rwma.   . ST-segment elevation myocardial infarction (STEMI) of inferior wall 11/20/2013  . Essential hypertension   . Hyperlipidemia with target LDL less than 100   . Marijuana abuse   . Morbid obesity   . Migraine     Past Surgical History  Procedure Laterality Date  . Cardiac catheterization  09/2009; September 2015    a) Mild RCA luminal Irregularities - MIld INf HK -- Med Rx; b) Inf STEMI: OM1 100%, m-dRCA 95-99% thrombotic mRCA with tandem 80% -- PCI, LAD & Cx relatively normal.  . Percutaneous coronary stent intervention (pci-s)  November 20, 2013    a) PCI - OM1 Promus Premier DES 3.5 mm x  38 mm, m-d RCA Promus Premier DES  3.0 mm x 20 mm & 3.0 mm x 16 mm  . Left heart catheterization with coronary angiogram N/A 11/20/2013    Procedure: LEFT HEART CATHETERIZATION WITH CORONARY ANGIOGRAM;  Surgeon: Marykay Lex, MD;  Location: [facility] CATH LAB;  Service: Cardiovascular;  Laterality: N/A;    Echo 11/2013:  - Left ventricle: The cavity size was normal. Systolic function was normal. The estimated ejection fraction was in the range of 55% to 60%. Wall motion was normal; there were no regional wall motion abnormalities. - Atrial septum: No defect or patent foramen ovale was identified.   Interval History: He presents now almost a year post MI, Reginald Fernandez is doing very well with no active anginal symptoms or heart failure symptoms. He does note having chronic headaches 2 times a week usually worse when waking up. He initially was not having problems with spelling post discharge but is now gone back to normal. He denies any resting or exertional chest tightness or pressure. No PND or orthopnea. Trivial edema.  (has not has an OSA evluation)  One feature that he does note is that he has a hard time initially getting up and going, once he is up moving he does fine and is able to overcome this initial issue. He is able to exercise without problems.  Mild  intermittent palpitations, he denies any associated lightheadedness, dizziness, weakness, syncope/near syncope, or TIA/amaurosis  fugax symptoms. No further epistaxis since stopping ASA. He was able to successfully quit smoking during his MI hospitalization - is now Vaping without nicotine  ROS: A comprehensive was performed. Review of Systems  Constitutional: Positive for weight loss. Negative for malaise/fatigue.  HENT: Positive for nosebleeds (Not as bad since qod ASA).   Respiratory: Negative for shortness of breath and wheezing.        Significant nighttime snoring. He does have some daytime fatigue.  Cardiovascular: Negative for  claudication.       Per history of present illness  Gastrointestinal: Negative for blood in stool and melena.  Genitourinary: Negative for hematuria.  Musculoskeletal: Negative.   Neurological: Negative for dizziness, sensory change, speech change, focal weakness, seizures and loss of consciousness.  Endo/Heme/Allergies: Does not bruise/bleed easily.  Psychiatric/Behavioral: Negative for depression and memory loss. The patient is not nervous/anxious.   All other systems reviewed and are negative.   Allergies  Allergen Reactions  . Penicillins Other (See Comments)    unknown    Current Outpatient Prescriptions on File Prior to Visit  Medication Sig Dispense Refill  . Acetaminophen (TYLENOL PO) Take 2 tablets by mouth 2 (two) times daily as needed (pain/ headache).    Marland Kitchen albuterol (PROVENTIL HFA;VENTOLIN HFA) 108 (90 BASE) MCG/ACT inhaler Inhale 2 puffs into the lungs every 6 (six) hours as needed for wheezing or shortness of breath.    Marland Kitchen aspirin EC 81 MG EC tablet Take 1 tablet (81 mg total) by mouth daily. (Patient taking differently: Take 81 mg by mouth daily. Takes every other day.)    . atorvastatin (LIPITOR) 80 MG tablet Take 1 tablet (80 mg total) by mouth daily at 6 PM. 30 tablet 8  . carvedilol (COREG) 12.5 MG tablet Take 1 tablet (12.5 mg total) by mouth 2 (two) times daily with a meal. 60 tablet 6  . lisinopril (PRINIVIL,ZESTRIL) 5 MG tablet Take 1 tablet (5 mg total) by mouth 2 (two) times daily. 180 tablet 3  . nitroGLYCERIN (NITROSTAT) 0.4 MG SL tablet Place 1 tablet (0.4 mg total) under the tongue every 5 (five) minutes as needed for chest pain. 25 tablet 3  . prasugrel (EFFIENT) 10 MG TABS tablet Take 1 tablet (10 mg total) by mouth daily. 30 tablet 6   No current facility-administered medications on file prior to visit.   Social History:  reports that he quit smoking about 4 years ago. He does not have any smokeless tobacco history on file. He reports that he uses illicit  drugs (Marijuana). He reports that he does not drink alcohol.   Family History: family history includes Heart attack (age of onset: 21) in his father; Heart failure in his father and father; Hyperlipidemia in his father; Hypertension in his father and mother.  no change - he quit smoking in September 2015 -   Wt Readings from Last 3 Encounters:  09/09/14 121.11 kg (267 lb)  05/13/14 114.306 kg (252 lb)  02/22/14 126.1 kg (278 lb)  he has gained back some of the weight he lost initially post MI.  PHYSICAL EXAM BP 122/74 mmHg  Pulse 70  Ht 6\' 1"  (1.854 m)  Wt 121.11 kg (267 lb)  BMI 35.23 kg/m2 General appearance: alert, cooperative, appears stated age, no distress and moderately obese Neck: no adenopathy, no carotid bruit and no JVD Lungs: clear to auscultation bilaterally, normal percussion bilaterally and Nonlabored, good air movement Heart: regular rate and rhythm, S1, S2 normal, no murmur, click, rub or  gallop and normal apical impulse Abdomen: soft, non-tender; bowel sounds normal; no masses,  no organomegaly Extremities: extremities normal, atraumatic, no cyanosis or edema Pulses: 2+ and symmetric Neurologic: Alert and oriented X 3, normal strength and tone. Normal symmetric reflexes. Normal coordination and gait   Adult ECG Report - N/A  Recent Labs:  None since discharge  Lab Results  Component Value Date   CHOL 209* 11/23/2013   HDL 29* 11/23/2013   LDLCALC 143* 11/23/2013   TRIG 185* 11/23/2013   CHOLHDL 7.2 11/23/2013    ASSESSMENT / PLAN: Problem List Items Addressed This Visit    CAD S/P percutaneous coronary angioplasty: DES PCI -Cx-OM1, RCA x 2 - Primary (Chronic)    Stable without recurrent anginal symptoms. Mild palpitations but nothing severe.  Continued Effient, stop aspirin  Continue carvedilol and lisinopril  Continue statin      Relevant Orders   Lipid panel   Comprehensive metabolic panel   Daytime somnolence (Chronic)    Was supposed to  have a sleep apnea test. This did not happen. We can readdress next visit.      Essential hypertension (Chronic)    Well controlled on current medications. No change.      Relevant Orders   Lipid panel   Comprehensive metabolic panel   History of acute inferior wall MI (Chronic)    Preserved EF and no heart failure symptoms. No angina symptoms. He had a non-STEMI/inferior STEMI in 2011 with minimal disease, however when he presented again in 2015, he had significant disease in the RCA and circumflex-OM. No regional wall motion abnormality on echocardiogram.      Hyperlipidemia with target LDL less than 100 (Chronic)    Continues to be on high-dose statin. Last labs were from September. Due for lipid panel And CMP. We may need to add additional agent if the LDL still is greater than 161.      Relevant Orders   Lipid panel   Comprehensive metabolic panel   Morbid obesity (Chronic)    Initially did quite well her weight loss. He still is down below the morbid obesity category and moderate obesity category. Continue to recommend diet modification and exercise. Increase his exercise level is crucial.       Other Visit Diagnoses    Drug therapy        Relevant Orders    Lipid panel    Comprehensive metabolic panel        Orders Placed This Encounter  Procedures  . Lipid panel    Order Specific Question:  Has the patient fasted?    Answer:  Yes  . Comprehensive metabolic panel    Order Specific Question:  Has the patient fasted?    Answer:  Yes   No orders of the defined types were placed in this encounter.    PATIENT INSTRUCTIONS:  PLEASE HAVE LABS DONE DO NOT EAT OR DRINK THE MORNING OF TESTS--LIPIDS  CMP  WHEN YOU ARE GOING TO BE IN THE HOT SUN  --HOLD YOUR MORNING DOSE OF LISINOPRIL. KEEP FULL HYDRATED .  MAKE SURE YOU HYDRATED BEFORE YOU TAKE THE EVENING DOSE.  Followup:6 months    HARDING, Piedad Climes, M.D., M.S. Interventional Cardiologist   Pager #  [phone]

## 2014-09-09 NOTE — Patient Instructions (Signed)
PLEASE HAVE LABS DONE DO NOT EAT OR DRINK THE MORNING OF TESTS--LIPIDS  CMP  WHEN YOU ARE GOING TO BE IN THE HOT SUN  --HOLD YOUR MORNING DOSE OF LISINOPRIL. KEEP FULL HYDRATED .  MAKE SURE YOU HYDRATED BEFORE YOU TAKE THE EVENING DOSE.   CONTINUE ALL OTHER MEDICATIONS   Your physician wants you to follow-up in DEC 2016 DR HARDING. You will receive a reminder letter in the mail two months in advance. If you don't receive a letter, please call our office to schedule the follow-up appointment.

## 2014-09-11 ENCOUNTER — Encounter: Payer: Self-pay | Admitting: Cardiology

## 2014-09-11 NOTE — Assessment & Plan Note (Signed)
Well controlled on current medications. No change 

## 2014-09-11 NOTE — Assessment & Plan Note (Signed)
Continues to be on high-dose statin. Last labs were from September. Due for lipid panel And CMP. We may need to add additional agent if the LDL still is greater than 361.

## 2014-09-11 NOTE — Assessment & Plan Note (Signed)
Preserved EF and no heart failure symptoms. No angina symptoms. He had a non-STEMI/inferior STEMI in 2011 with minimal disease, however when he presented again in 2015, he had significant disease in the RCA and circumflex-OM. No regional wall motion abnormality on echocardiogram.

## 2014-09-11 NOTE — Assessment & Plan Note (Signed)
Initially did quite well her weight loss. He still is down below the morbid obesity category and moderate obesity category. Continue to recommend diet modification and exercise. Increase his exercise level is crucial.

## 2014-09-11 NOTE — Assessment & Plan Note (Signed)
Was supposed to have a sleep apnea test. This did not happen. We can readdress next visit.

## 2014-09-11 NOTE — Assessment & Plan Note (Signed)
Stable without recurrent anginal symptoms. Mild palpitations but nothing severe.  Continued Effient, stop aspirin  Continue carvedilol and lisinopril  Continue statin

## 2014-11-24 ENCOUNTER — Other Ambulatory Visit: Payer: Self-pay | Admitting: Nurse Practitioner

## 2014-11-24 NOTE — Telephone Encounter (Signed)
Rx request sent to pharmacy.  

## 2014-12-25 LAB — LIPID PANEL
CHOL/HDL RATIO: 6.5 ratio — AB (ref <=5.0)
Cholesterol: 176 mg/dL (ref 125–200)
HDL: 27 mg/dL — AB (ref >=40)
LDL CALC: 98 mg/dL (ref <130)
Triglycerides: 253 mg/dL — ABNORMAL HIGH (ref <150)
VLDL: 51 mg/dL — ABNORMAL HIGH (ref <30)

## 2014-12-25 LAB — COMPREHENSIVE METABOLIC PANEL
ALT: 12 U/L (ref 9–46)
AST: 12 U/L (ref 10–40)
Albumin: 4.4 g/dL (ref 3.6–5.1)
Alkaline Phosphatase: 65 U/L (ref 40–115)
BUN: 12 mg/dL (ref 7–25)
CHLORIDE: 104 mmol/L (ref 98–110)
CO2: 26 mmol/L (ref 20–31)
CREATININE: 0.74 mg/dL (ref 0.60–1.35)
Calcium: 9.2 mg/dL (ref 8.6–10.3)
Glucose, Bld: 102 mg/dL — ABNORMAL HIGH (ref 65–99)
Potassium: 4 mmol/L (ref 3.5–5.3)
Sodium: 141 mmol/L (ref 135–146)
Total Bilirubin: 1 mg/dL (ref 0.2–1.2)
Total Protein: 7 g/dL (ref 6.1–8.1)

## 2014-12-30 ENCOUNTER — Telehealth: Payer: Self-pay | Admitting: *Deleted

## 2014-12-30 NOTE — Telephone Encounter (Signed)
-----   Message from Marykay Lex, MD sent at 12/29/2014  7:45 PM EDT ----- Lipids look much better than they had, but the triglycerides are higher. HDL is still low which is a risk factor in and of itself. The only way to increase this level is exercise. Fish oil may also help The LDL is doubly better, but needs to be less than 70. --> let's add Zetia 10 mg daily  Recheck in 6 months.  Marykay Lex, MD

## 2014-12-30 NOTE — Telephone Encounter (Signed)
Left message with mother to call back  

## 2015-01-06 ENCOUNTER — Telehealth: Payer: Self-pay | Admitting: *Deleted

## 2015-01-06 DIAGNOSIS — E785 Hyperlipidemia, unspecified: Secondary | ICD-10-CM

## 2015-01-06 DIAGNOSIS — I10 Essential (primary) hypertension: Secondary | ICD-10-CM

## 2015-01-06 DIAGNOSIS — Z79899 Other long term (current) drug therapy: Secondary | ICD-10-CM

## 2015-01-06 MED ORDER — EZETIMIBE 10 MG PO TABS: 10.0000 mg | ORAL_TABLET | Freq: Every day | ORAL | Status: DC

## 2015-01-06 NOTE — Telephone Encounter (Signed)
Spoke to patient. Result given . Verbalized understanding Labs in 6 months E- sent  [facility]ications [facility] x[facility]

## 2015-03-24 ENCOUNTER — Other Ambulatory Visit: Payer: Self-pay | Admitting: Cardiology

## 2015-03-24 MED FILL — EZETIMIBE 10 MG TABLET: 10 | 30 days supply | Qty: 30 | Fill #2

## 2015-03-24 MED FILL — LISINOPRIL 5 MG TABLET: 5 | 30 days supply | Qty: 60 | Fill #9

## 2015-03-24 MED FILL — CARVEDILOL 12.5 MG TABLET: 12.5 | 30 days supply | Qty: 60 | Fill #5

## 2015-03-24 NOTE — Telephone Encounter (Signed)
Rx request sent to pharmacy.  

## 2015-03-25 MED FILL — EFFIENT 10 MG TABLET: 10 | 30 days supply | Qty: 30 | Fill #0

## 2015-05-09 ENCOUNTER — Other Ambulatory Visit: Payer: Self-pay | Admitting: Cardiology

## 2015-05-09 NOTE — Telephone Encounter (Signed)
Rx request sent to pharmacy.  

## 2015-05-18 ENCOUNTER — Other Ambulatory Visit: Payer: Self-pay | Admitting: Cardiology

## 2015-05-18 MED FILL — EZETIMIBE 10 MG TABLET: 10 | 30 days supply | Qty: 30 | Fill #3

## 2015-05-18 MED FILL — CARVEDILOL 12.5 MG TABLET: 12.5 | 30 days supply | Qty: 60 | Fill #6

## 2015-05-19 MED FILL — LISINOPRIL 5 MG TABLET: 5 | 30 days supply | Qty: 60 | Fill #0

## 2015-05-19 NOTE — Telephone Encounter (Signed)
REFILL 

## 2015-05-20 ENCOUNTER — Other Ambulatory Visit: Payer: Self-pay | Admitting: Cardiology

## 2015-05-20 MED FILL — EFFIENT 10 MG TABLET: 10 | 30 days supply | Qty: 30 | Fill #0

## 2015-05-20 NOTE — Telephone Encounter (Signed)
Rx(s) sent to pharmacy electronically.  

## 2015-06-02 ENCOUNTER — Encounter: Payer: Self-pay | Admitting: Cardiology

## 2015-06-02 ENCOUNTER — Ambulatory Visit (INDEPENDENT_AMBULATORY_CARE_PROVIDER_SITE_OTHER): Payer: Managed Care, Other (non HMO) | Admitting: Cardiology

## 2015-06-02 VITALS — BP 150/100 | HR 78 | Ht 74.0 in | Wt 288.0 lb

## 2015-06-02 DIAGNOSIS — Z9861 Coronary angioplasty status: Secondary | ICD-10-CM

## 2015-06-02 DIAGNOSIS — I2129 ST elevation (STEMI) myocardial infarction involving other sites: Secondary | ICD-10-CM | POA: Diagnosis not present

## 2015-06-02 DIAGNOSIS — E785 Hyperlipidemia, unspecified: Secondary | ICD-10-CM | POA: Diagnosis not present

## 2015-06-02 DIAGNOSIS — I2510 Atherosclerotic heart disease of native coronary artery without angina pectoris: Secondary | ICD-10-CM

## 2015-06-02 DIAGNOSIS — I10 Essential (primary) hypertension: Secondary | ICD-10-CM | POA: Diagnosis not present

## 2015-06-02 DIAGNOSIS — Z79899 Other long term (current) drug therapy: Secondary | ICD-10-CM

## 2015-06-02 DIAGNOSIS — R0683 Snoring: Secondary | ICD-10-CM

## 2015-06-02 MED ORDER — LISINOPRIL 20 MG PO TABS: 20.0000 mg | ORAL_TABLET | Freq: Every day | ORAL | Status: DC

## 2015-06-02 NOTE — Patient Instructions (Signed)
Medication Instructions: Dr Herbie Baltimore has recommended making the following medication changes: INCREASE Lisinopril 20 mg - take 1 tablet by mouth daily  >>A new prescription has been sent to your pharmacy electronically  Labwork: Your physician recommends that you return for lab work in 1 month.  Testing/Procedures: NONE  Follow-up: Dr Herbie Baltimore recommends that you schedule a follow-up appointment in 6 months. You will receive a reminder letter in the mail two months in advance. If you don't receive a letter, please call our office to schedule the follow-up appointment.  If you need a refill on your cardiac medications before your next appointment, please call your pharmacy.

## 2015-06-02 NOTE — Progress Notes (Signed)
PCP: No PCP Per Patient  Clinic Note: Chief Complaint  Patient presents with  . Follow-up     no chest pain, no shortness of breath, no edema, no pain or cramping in legs, no lightheadedness or dizziness, has fatigue  . Coronary Artery Disease  . Obesity  . Hypertension    HPI: Reginald Fernandez is a 35 y.o. male with a PMH below who presents today for ~9 month f/u of MI-CAD. Inferior-Inflat SEMI Sept 2015 --> cardiac cath revealed severe disease in 1 and RCA treated with Promus Premier DES.  Reginald Fernandez was last seen on 09/09/2014.  He was doing well at time, mostly bothered by chronic daily headache. He had not yet had his OSA evaluation.  Recent Hospitalizations: N/A Studies Reviewed: N/A  Interval History: Reginald Fernandez presents today really with no major symptoms. He is are little upset that he has gained weight. He was actually thinking that he may have lost weight because his been more active. 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 He does acknowledge that he has not been doing routine exercise however. He tends to one snack in the day in addition to eating regular meals. Besides occasional fleeting flutter sensation in his chest, he is not had any further anginal symptoms with rest or exertion. No bleeding issues on Effient. No heart failure symptoms. Reginald Fernandez cardiac review of symptoms as follows:   No chest pain or shortness of breath with rest or exertion.  No PND, orthopnea or edema.  No palpitations, lightheadedness, dizziness, weakness or syncope/near syncope. No TIA/amaurosis fugax symptoms. No claudication.  ROS: A comprehensive was performed. Review of Systems  Constitutional: Negative for weight loss (Concerned about his weight gain).  HENT: Negative for nosebleeds.   Respiratory: Negative.  Negative for cough.   Cardiovascular: Negative.  Negative for claudication.       Per history of present illness  Gastrointestinal: Negative for heartburn (Depending what he  eats), blood in stool and melena.  Genitourinary: Negative for hematuria.  Musculoskeletal: Negative for myalgias, back pain, joint pain and falls.  Neurological: Negative for sensory change, speech change, focal weakness, seizures, loss of consciousness and headaches.       He does have some daytime somnolence. Falls asleep easily.  Endo/Heme/Allergies: Does not bruise/bleed easily.  All other systems reviewed and are negative.    Past Medical History  Diagnosis Date  . CAD S/P percutaneous coronary angioplasty 11/20/2013    a. 09/2009: EKG with Inf STEMI - no obstructive CAD;  b. 11/2013 Inflat STEMI/PCI: LM nl, LAD 20p, D1 sm - diff dzs, D2 large - nl, LCX 95-99, OM1 100 (3.5x38 Promus Premier DES), RCA 95-67m, 80d (3.0x20 and 3.0x16 Promus Premier DES');  c. 11/2013 Echo: Ef 55-60%, no rwma.   . ST-segment elevation myocardial infarction (STEMI) of inferior wall (HCC) 11/20/2013  . Essential hypertension   . Hyperlipidemia with target LDL less than 100   . Marijuana abuse   . Morbid obesity (HCC)   . Migraine     Past Surgical History  Procedure Laterality Date  . Cardiac catheterization  09/2009;     Mild RCA luminal Irregularities - MIld INf HK -- Med Rx; b)  . Left heart catheterization with coronary angiogram N/A 11/20/2013    Procedure: LEFT HEART CATHETERIZATION WITH CORONARY ANGIOGRAM;  Surgeon: Marykay Lex, MD;  Location: [facility] CATH LAB;  Service: Cardiovascular;   Inf STEMI: OM1 100%, m-dRCA 95-99% thrombotic mRCA with tandem 80% -- PCI,  LAD & Cx relatively normal.  . Percutaneous coronary stent intervention (pci-s)  11/20/2013    a) PCI - OM1 Promus Premier DES 3.5 mm x 38 mm, m-d RCA Promus Premier DES  3.0 mm x 20 mm & 3.0 mm x 16 mm  . Transthoracic echocardiogram  11/20/2013    LV EF 55-60%. No regional wall motion abnormality.  Normal valves    Prior to Admission medications   Medication Sig Start Date End Date Taking? Authorizing Provider  Acetaminophen (TYLENOL PO)  Take 2 tablets by mouth 2 (two) times daily as needed (pain/ headache).   Yes Historical Provider, MD  albuterol (PROVENTIL HFA;VENTOLIN HFA) 108 (90 BASE) MCG/ACT inhaler Inhale 2 puffs into the lungs every 6 (six) hours as needed for wheezing or shortness of breath.   Yes Historical Provider, MD  aspirin 81 MG tablet Take 81 mg by mouth as needed for pain.   Yes Historical Provider, MD  atorvastatin (LIPITOR) 80 MG tablet Take 1 tablet (80 mg total) by mouth daily at 6 PM. 07/06/14  Yes Marykay Lex, MD  carvedilol (COREG) 12.5 MG tablet Take 1 tablet (12.5 mg total) by mouth 2 (two) times daily with a meal. 07/26/14  Yes Marykay Lex, MD  ezetimibe (ZETIA) 10 MG tablet Take 1 tablet (10 mg total) by mouth daily. 01/06/15  Yes Marykay Lex, MD  lisinopril (PRINIVIL,ZESTRIL) 5 MG tablet Take 1 tablet (5 mg total) by mouth 2 (two) times daily. NEED OV. 05/19/15  Yes Marykay Lex, MD  nitroGLYCERIN (NITROSTAT) 0.4 MG SL tablet Place 1 tablet (0.4 mg total) under the tongue every 5 (five) minutes as needed for chest pain. 11/23/13  Yes Ok Anis, NP  prasugrel (EFFIENT) 10 MG TABS tablet Take 1 tablet (10 mg total) by mouth daily. 05/20/15  Yes Marykay Lex, MD    Allergies  Allergen Reactions  . Penicillins Other (See Comments)    unknown    Social History   Social History  . Marital Status: Married    Spouse Name: N/A  . Number of Children: 2  . Years of Education: N/A   Occupational History  .      [employer]   Social History Main Topics  . Smoking status: Former Smoker    Quit date: 11/20/2009  . Smokeless tobacco: Never Used  . Alcohol Use: No  . Drug Use: Yes    Special: Marijuana  . Sexual Activity: Not Asked   Other Topics Concern  . None   Social History Narrative   Family History  Problem Relation Age of Onset  . Heart attack Father 76    2 MIs by age 4 -- CABG  . Hypertension Mother   . Hypertension Father   . Heart failure Father   .  Hyperlipidemia Father   . Heart failure Father     Wt Readings from Last 3 Encounters:  06/02/15 288 lb (130.636 kg)  09/09/14 267 lb (121.11 kg)  05/13/14 252 lb (114.306 kg)    PHYSICAL EXAM BP 150/100 mmHg  Pulse 78  Ht 6\' 2"  (1.88 m)  Wt 288 lb (130.636 kg)  BMI 36.96 kg/m2 General appearance: alert, cooperative, appears stated age, no distress and moderately obese HEENT: [place]/AT, EOMI, MMM, anicteric sclera Neck: no adenopathy, no carotid bruit and no JVD Lungs: clear to auscultation bilaterally, normal percussion bilaterally and Nonlabored, good air movement Heart: regular rate and rhythm, S1, S2 normal, no murmur, click, rub or  gallop and normal apical impulse Abdomen: soft, non-tender; bowel sounds normal; no masses, no organomegaly Extremities: extremities normal, atraumatic, no cyanosis or edema Pulses: 2+ and symmetric Neurologic: Alert and oriented X 3, normal strength and tone. Normal symmetric reflexes. Normal coordination and gait    Adult ECG Report  Rate: 78 ;  Rhythm: normal sinus rhythm and Left anterior fascicular block (-51); T-wave inversion noted in V6 is new, but otherwise stable.;   Narrative Interpretation: Relatively stable EKG.   Other studies Reviewed: Additional studies/ records that were reviewed today include:  Recent Labs:   Lab Results  Component Value Date   CHOL 176 12/24/2014   HDL 27* 12/24/2014   LDLCALC 98 12/24/2014   TRIG 253* 12/24/2014   CHOLHDL 6.5* 12/24/2014    ASSESSMENT / PLAN: Problem List Items Addressed This Visit    ST elevation myocardial infarction (STEMI) of true posterior wall, subsequent episode of care (HCC) - Primary (Chronic)    No recurrent anginal symptoms. Normal preserved EF on echo. Borderline Q waves on EKG in the inferior leads suggest prior inferior MI, but no wall motion abnormality on echo.  No recurrent anginal symptoms or heart failure symptoms.      Relevant Medications   aspirin 81 MG tablet    lisinopril (PRINIVIL,ZESTRIL) 20 MG tablet   Other Relevant Orders   EKG 12-Lead (Completed)   Snoring (Chronic)   Relevant Orders   EKG 12-Lead (Completed)   Snores    We talked using breathing strips to help him breathe at night. We've talked about daytime somnolence and he probably would benefit from a sleep study. He wants to lose weight and see if this gets better.      Morbid obesity (HCC) (Chronic)    Unfortunately, he gained a lot of weight. Lost back and is actually heavier now than he was before. He is quite down about the weight change. Valves that he will get back into exercising try to lose the weight. He needs to stay active to keep up with his young son who is just started playing basketball and potentially soccer.      Relevant Orders   EKG 12-Lead (Completed)   Hyperlipidemia with target LDL less than 70 (Chronic)    Overall his labs look better in October, but still not goal. Continue high-dose statin. I'm concerned however that his weight gain will potentially make his labs worse. We talked again about trying to lose weight. We talked about dietary adjustments. His wife indicates that she is definitely trying to be health-conscious with cooking.  We should follow-up his labs now and make adjustments based on the results. May need to consider Zetia.  If not at goal with Zetia and high-dose statin, would consider PCSK9 inhibitor adjunctive therapy      Relevant Medications   aspirin 81 MG tablet   lisinopril (PRINIVIL,ZESTRIL) 20 MG tablet   Other Relevant Orders   EKG 12-Lead (Completed)   Lipid panel   Comprehensive metabolic panel   Essential hypertension (Chronic)    Not quite adequately controlled today.  Continue current dose of carvedilol for now, but could increase with resting heart rate of 78  Increased lisinopril to total of 20 mg a day. He is currently taking it 5 twice a day (will change to 20 mg once daily)      Relevant Medications   aspirin  81 MG tablet   lisinopril (PRINIVIL,ZESTRIL) 20 MG tablet   Other Relevant Orders   EKG 12-Lead (  Completed)   CAD S/P percutaneous coronary angioplasty: DES PCI -Cx-OM1, RCA x 2 (Chronic)    Stable without any recurrent anginal symptoms.  Only mild occasional "fluttering".  Continue carvedilol plus lisinopril. --> Increasing lisinopril to a total of 20 mg a day  Continue high-dose atorvastatin  Continue Effient, previously was instructed that he can stop aspirin. --> Would consider switching to Plavix at follow-up      Relevant Medications   aspirin 81 MG tablet   lisinopril (PRINIVIL,ZESTRIL) 20 MG tablet   Other Relevant Orders   EKG 12-Lead (Completed)    Other Visit Diagnoses    Medication management        Relevant Orders    Lipid panel    Comprehensive metabolic panel       Current medicines are reviewed at length with the patient today. (+/- concerns) None The following changes have been made:  INCREASE Lisinopril 20 mg - take 1 tablet by mouth daily  >>A new prescription has been sent to your pharmacy electronically  Labwork: Your physician recommends that you return for lab work in 1 month.  Testing/Procedures: NONE  Follow-up: Dr Herbie Baltimore recommends that you schedule a follow-up appointment in 6 months.  Studies Ordered:   Orders Placed This Encounter  Procedures  . Lipid panel  . Comprehensive metabolic panel  . EKG 12-Lead      Marykay Lex, M.D., M.S. Interventional Cardiologist   Pager # [phone] Phone # [phone] [address] [place], [place] [address]

## 2015-06-04 ENCOUNTER — Encounter: Payer: Self-pay | Admitting: Cardiology

## 2015-06-04 NOTE — Assessment & Plan Note (Addendum)
Overall his labs look better in October, but still not goal. Continue high-dose statin. I'm concerned however that his weight gain will potentially make his labs worse. We talked again about trying to lose weight. We talked about dietary adjustments. His wife indicates that she is definitely trying to be health-conscious with cooking.  We should follow-up his labs now and make adjustments based on the results. May need to consider Zetia.  If not at goal with Zetia and high-dose statin, would consider PCSK9 inhibitor adjunctive therapy

## 2015-06-04 NOTE — Assessment & Plan Note (Signed)
Unfortunately, he gained a lot of weight. Lost back and is actually heavier now than he was before. He is quite down about the weight change. Valves that he will get back into exercising try to lose the weight. He needs to stay active to keep up with his young son who is just started playing basketball and potentially soccer.

## 2015-06-04 NOTE — Assessment & Plan Note (Signed)
Stable without any recurrent anginal symptoms.  Only mild occasional "fluttering".  Continue carvedilol plus lisinopril. --> Increasing lisinopril to a total of 20 mg a day  Continue high-dose atorvastatin  Continue Effient, previously was instructed that he can stop aspirin. --> Would consider switching to Plavix at follow-up

## 2015-06-04 NOTE — Assessment & Plan Note (Signed)
We talked using breathing strips to help him breathe at night. We've talked about daytime somnolence and he probably would benefit from a sleep study. He wants to lose weight and see if this gets better.

## 2015-06-04 NOTE — Assessment & Plan Note (Signed)
No recurrent anginal symptoms. Normal preserved EF on echo. Borderline Q waves on EKG in the inferior leads suggest prior inferior MI, but no wall motion abnormality on echo.  No recurrent anginal symptoms or heart failure symptoms.

## 2015-06-04 NOTE — Assessment & Plan Note (Signed)
Not quite adequately controlled today.  Continue current dose of carvedilol for now, but could increase with resting heart rate of 78  Increased lisinopril to total of 20 mg a day. He is currently taking it 5 twice a day (will change to 20 mg once daily)

## 2015-06-07 ENCOUNTER — Telehealth: Payer: Self-pay | Admitting: *Deleted

## 2015-06-07 DIAGNOSIS — I10 Essential (primary) hypertension: Secondary | ICD-10-CM

## 2015-06-07 DIAGNOSIS — E785 Hyperlipidemia, unspecified: Secondary | ICD-10-CM

## 2015-06-07 DIAGNOSIS — Z79899 Other long term (current) drug therapy: Secondary | ICD-10-CM

## 2015-06-07 NOTE — Telephone Encounter (Signed)
-----   Message from Tobin Chad, RN sent at 01/06/2015  5:38 PM EDT ----- Labs in 6 months - April 2017 Lipid cmp mail in march

## 2015-06-07 NOTE — Telephone Encounter (Signed)
Mailed letter and lab slip due by 07/07/15  lipid, cholesterol

## 2015-06-17 ENCOUNTER — Other Ambulatory Visit: Payer: Self-pay | Admitting: Cardiology

## 2015-06-17 NOTE — Telephone Encounter (Signed)
Rx(s) sent to pharmacy electronically.  

## 2015-06-20 MED FILL — EFFIENT 10 MG TABLET: 10 | 30 days supply | Qty: 30 | Fill #0

## 2015-06-24 ENCOUNTER — Other Ambulatory Visit: Payer: Self-pay | Admitting: Cardiology

## 2015-06-24 MED FILL — EZETIMIBE 10 MG TABLET: 10 | 30 days supply | Qty: 30 | Fill #4

## 2015-06-24 MED FILL — CARVEDILOL 12.5 MG TABLET: 12.5 | 30 days supply | Qty: 60 | Fill #0

## 2015-06-24 MED FILL — LISINOPRIL 5 MG TABLET: 5 | 30 days supply | Qty: 60 | Fill #1

## 2015-07-27 ENCOUNTER — Other Ambulatory Visit: Payer: Self-pay | Admitting: Cardiology

## 2015-07-27 MED FILL — EFFIENT 10 MG TABLET: 10 | 30 days supply | Qty: 30 | Fill #1

## 2015-07-27 MED FILL — ATORVASTATIN 80 MG TABLET: 80 | 30 days supply | Qty: 30 | Fill #0

## 2015-07-27 MED FILL — CARVEDILOL 12.5 MG TABLET: 12.5 | 30 days supply | Qty: 60 | Fill #1

## 2015-07-27 MED FILL — LISINOPRIL 5 MG TABLET: 5 | 30 days supply | Qty: 60 | Fill #2

## 2015-07-27 MED FILL — EZETIMIBE 10 MG TABLET: 10 | 30 days supply | Qty: 30 | Fill #5

## 2015-07-27 NOTE — Telephone Encounter (Signed)
Rx has been sent to the pharmacy electronically. ° °

## 2015-09-05 MED FILL — EZETIMIBE 10 MG TABLET: 10 | 30 days supply | Qty: 30 | Fill #6

## 2015-09-05 MED FILL — EFFIENT 10 MG TABLET: 10 | 30 days supply | Qty: 30 | Fill #2

## 2015-10-05 MED FILL — EFFIENT 10 MG TABLET: 10 | 30 days supply | Qty: 30 | Fill #3

## 2015-10-05 MED FILL — EZETIMIBE 10 MG TABLET: 10 | 30 days supply | Qty: 30 | Fill #7

## 2015-10-05 MED FILL — ATORVASTATIN 80 MG TABLET: 80 | 30 days supply | Qty: 30 | Fill #1

## 2015-10-05 MED FILL — CARVEDILOL 12.5 MG TABLET: 12.5 | 30 days supply | Qty: 60 | Fill #2

## 2015-10-05 MED FILL — LISINOPRIL 5 MG TABLET: 5 | 30 days supply | Qty: 60 | Fill #3

## 2015-11-07 MED FILL — EZETIMIBE 10 MG TABLET: 10 | 30 days supply | Qty: 30 | Fill #8

## 2015-11-07 MED FILL — EFFIENT 10 MG TABLET: 10 | 30 days supply | Qty: 30 | Fill #4

## 2015-11-15 MED FILL — ATORVASTATIN 80 MG TABLET: 80 | 30 days supply | Qty: 30 | Fill #2

## 2015-11-15 MED FILL — LISINOPRIL 5 MG TABLET: 5 | 30 days supply | Qty: 60 | Fill #4

## 2015-11-15 MED FILL — CARVEDILOL 12.5 MG TABLET: 12.5 | 30 days supply | Qty: 60 | Fill #3

## 2016-01-03 MED FILL — ATORVASTATIN 80 MG TABLET: 80 | 30 days supply | Qty: 30 | Fill #3

## 2016-01-03 MED FILL — LISINOPRIL 5 MG TABLET: 5 | 30 days supply | Qty: 60 | Fill #5

## 2016-01-03 MED FILL — CARVEDILOL 12.5 MG TABLET: 12.5 | 30 days supply | Qty: 60 | Fill #4

## 2016-04-17 MED FILL — ATORVASTATIN 80 MG TABLET: 80 | 30 days supply | Qty: 30 | Fill #4

## 2016-04-17 MED FILL — CARVEDILOL 12.5 MG TABLET: 12.5 | 30 days supply | Qty: 60 | Fill #5

## 2016-04-17 MED FILL — LISINOPRIL 5 MG TABLET: 5 | 30 days supply | Qty: 60 | Fill #6

## 2016-05-30 MED FILL — ATORVASTATIN 80 MG TABLET: 80 | 30 days supply | Qty: 30 | Fill #5

## 2016-05-30 MED FILL — CARVEDILOL 12.5 MG TABLET: 12.5 | 30 days supply | Qty: 60 | Fill #6

## 2016-06-01 ENCOUNTER — Other Ambulatory Visit: Payer: Self-pay

## 2016-06-01 MED ORDER — LISINOPRIL 20 MG PO TABS: 20.0000 mg | ORAL_TABLET | Freq: Every day | ORAL | 0 refills | Status: DC

## 2016-06-04 MED FILL — LISINOPRIL 20 MG TABLET: 20 | 30 days supply | Qty: 30 | Fill #0

## 2016-07-12 ENCOUNTER — Encounter: Payer: Self-pay | Admitting: Cardiology

## 2016-07-12 ENCOUNTER — Ambulatory Visit (INDEPENDENT_AMBULATORY_CARE_PROVIDER_SITE_OTHER): Payer: 59 | Admitting: Cardiology

## 2016-07-12 VITALS — BP 143/98 | HR 74 | Ht 74.0 in | Wt 275.0 lb

## 2016-07-12 DIAGNOSIS — I2129 ST elevation (STEMI) myocardial infarction involving other sites: Secondary | ICD-10-CM

## 2016-07-12 DIAGNOSIS — Z9861 Coronary angioplasty status: Secondary | ICD-10-CM | POA: Diagnosis not present

## 2016-07-12 DIAGNOSIS — I10 Essential (primary) hypertension: Secondary | ICD-10-CM | POA: Diagnosis not present

## 2016-07-12 DIAGNOSIS — E785 Hyperlipidemia, unspecified: Secondary | ICD-10-CM | POA: Diagnosis not present

## 2016-07-12 DIAGNOSIS — I2510 Atherosclerotic heart disease of native coronary artery without angina pectoris: Secondary | ICD-10-CM

## 2016-07-12 MED ORDER — HYDROCHLOROTHIAZIDE 25 MG PO TABS: 25.0000 mg | ORAL_TABLET | Freq: Every day | ORAL | 3 refills | Status: DC

## 2016-07-12 MED FILL — HYDROCHLOROTHIAZIDE 25 MG T: 25 | 30 days supply | Qty: 30 | Fill #0

## 2016-07-12 NOTE — Patient Instructions (Addendum)
LABS IN 2 WEEKS  - [address] NOTHING TO EAT OR DRINK THE DAY OF TEST CMP LIPIDS    MEDICATION ADDITION  HCTZ ( HYDROCHLOROTHIAZIDE) 25 MG ONE TABLET  A DAILY     Your physician wants you to follow-up in 12 MONTHS WITH DR HARDING.You will receive a reminder letter in the mail two months in advance. If you don't receive a letter, please call our office to schedule the follow-up appointment.   If you need a refill on your cardiac medications before your next appointment, please call your pharmacy.

## 2016-07-12 NOTE — Progress Notes (Signed)
PCP: No PCP Per Patient  Clinic Note: Chief Complaint  Patient presents with  . Follow-up    CAD- Inf STEMI  . Shortness of Breath    when exerting self in sports    HPI: Reginald Fernandez is a 36 y.o. male with a PMH below who presents today for Annual follow-up for CAD STEMI.  Inferior-Inflat SEMI Sept 2015 --> cardiac cath revealed severe disease in 1 and RCA treated with Promus Premier DES  Reginald Fernandez was last seen in March 2017 =- only concern was having gained weight  Recent Hospitalizations: none  Studies Reviewed: none  Interval History: He presents today overall doing very well. No major issues. He is working out doing exercises 3-4 days a week. 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 He has not had any further episodes of chest tightness or pressure with rest or exertion. No exertional dyspnea. He has rare cramping sensation in the center of his chest that has nothing to do with any particular activity or any other symptoms. These are fleeting episodes. He also has had some injury of his left shoulder. He has made a very focused attempted trying to adjust his diet and continue his exercise. No PND, orthopnea or edema.   No palpitations, lightheadedness, dizziness, weakness or syncope/near syncope. No TIA/amaurosis fugax symptoms. No melena, hematochezia, hematuria, or epstaxis. No claudication.  ROS: A comprehensive was performed.  Pertinent Positives noted  Review of Systems  Constitutional: Negative for malaise/fatigue.  HENT: Negative for congestion.   Respiratory: Negative for cough and shortness of breath.   Musculoskeletal: Positive for joint pain (L shoulder).  Neurological: Negative for dizziness.  All other systems reviewed and are negative.  I have reviewed and (if needed) updated the patient's problem list, medications, allergies, past medical and surgical history, social and family history.  Past Medical History:  Diagnosis  Date  . CAD S/P percutaneous coronary angioplasty 11/20/2013   a. 09/2009: EKG with Inf STEMI - no obstructive CAD;  b. 11/2013 Inflat STEMI/PCI: LM nl, LAD 20p, D1 sm - diff dzs, D2 large - nl, LCX 95-99, OM1 100 (3.5x38 Promus Premier DES), RCA 95-42m, 80d (3.0x20 and 3.0x16 Promus Premier DES');  c. 11/2013 Echo: Ef 55-60%, no rwma.   . Essential hypertension   . Hyperlipidemia with target LDL less than 100   . Marijuana abuse   . Migraine   . Morbid obesity (HCC)   . ST-segment elevation myocardial infarction (STEMI) of inferior wall (HCC) 11/20/2013    Past Surgical History:  Procedure Laterality Date  . CARDIAC CATHETERIZATION  09/2009;    Mild RCA luminal Irregularities - MIld INf HK -- Med Rx; b)  . LEFT HEART CATHETERIZATION WITH CORONARY ANGIOGRAM N/A 11/20/2013   Procedure: LEFT HEART CATHETERIZATION WITH CORONARY ANGIOGRAM;  Surgeon: Marykay Lex, MD;  Location: [facility] CATH LAB;  Service: Cardiovascular;   Inf STEMI: OM1 100%, m-dRCA 95-99% thrombotic mRCA with tandem 80% -- PCI, LAD & Cx relatively normal.  . PERCUTANEOUS CORONARY STENT INTERVENTION (PCI-S)  11/20/2013   a) PCI - OM1 Promus Premier DES 3.5 mm x 38 mm, m-d RCA Promus Premier DES  3.0 mm x 20 mm & 3.0 mm x 16 mm  . TRANSTHORACIC ECHOCARDIOGRAM  11/20/2013   LV EF 55-60%. No regional wall motion abnormality.  Normal valves     Current Meds  Medication Sig  . Acetaminophen (TYLENOL PO) Take 2 tablets by mouth 2 (two) times daily  as needed (pain/ headache).  Marland Kitchen albuterol (PROVENTIL HFA;VENTOLIN HFA) 108 (90 BASE) MCG/ACT inhaler Inhale 2 puffs into the lungs every 6 (six) hours as needed for wheezing or shortness of breath.  Marland Kitchen aspirin 81 MG tablet Take 81 mg by mouth as needed for pain.  Marland Kitchen atorvastatin (LIPITOR) 80 MG tablet TAKE 1 TABLET BY MOUTH ONCE DAILY AT 6 PM.  . carvedilol (COREG) 12.5 MG tablet TAKE 1 TABLET (12.5 MG TOTAL) BY MOUTH 2 (TWO) TIMES DAILY WITH A MEAL.  Marland Kitchen EFFIENT 10 MG TABS tablet TAKE 1 TABLET BY  MOUTH ONCE DAILY  . ezetimibe (ZETIA) 10 MG tablet Take 1 tablet (10 mg total) by mouth daily.  Marland Kitchen lisinopril (PRINIVIL,ZESTRIL) 20 MG tablet Take 1 tablet (20 mg total) by mouth daily. PLEASE CONTACT OFFICE FOR ADDITIONAL REFILLS  . nitroGLYCERIN (NITROSTAT) 0.4 MG SL tablet Place 1 tablet (0.4 mg total) under the tongue every 5 (five) minutes as needed for chest pain.    Allergies  Allergen Reactions  . Penicillins Other (See Comments)    unknown    Social History   Social History  . Marital status: Married    Spouse name: N/A  . Number of children: 2  . Years of education: N/A   Occupational History  .  [employer]   Social History Main Topics  . Smoking status: Former Smoker    Quit date: 11/20/2009  . Smokeless tobacco: Never Used  . Alcohol use No  . Drug use: Yes    Types: Marijuana  . Sexual activity: Not Asked   Other Topics Concern  . None   Social History Narrative  . None    family history includes Heart attack (age of onset: 64) in his father; Heart failure in his father and father; Hyperlipidemia in his father; Hypertension in his father and mother.  Wt Readings from Last 3 Encounters:  07/12/16 275 lb (124.7 kg)  06/02/15 288 lb (130.6 kg)  09/09/14 267 lb (121.1 kg)    PHYSICAL EXAM BP (!) 143/98   Pulse 74   Ht 6\' 2"  (1.88 m)   Wt 275 lb (124.7 kg)   BMI 35.31 kg/m  General appearance: alert, cooperative, appears stated age, no distress and moderately obese; well-nourished, well-groomed Neck: no adenopathy, no carotid bruit and no JVD Lungs: clear to auscultation bilaterally, normal percussion bilaterally and non-labored Heart: regular rate and rhythm, S1 7S2 normal, no murmur, click, rub or gallop ; non-displaced PMI Abdomen: soft, non-tender; bowel sounds normal; no masses,  no organomegaly; no HJR Extremities: extremities normal, atraumatic, no cyanosis or edema  Pulses: 2+ and symmetric;  Skin:  mobility and turgor normal, no evidence of bleeding or bruising, no lesions noted, temperature normal and texture normal or  Neurologic: Mental status: Alert, oriented, thought content appropriate Cranial nerves: normal (II-XII grossly intact)   Adult ECG Report  Rate: 74 ;  Rhythm: normal sinus rhythm and Inferior MI, age undetermined. Inferolateral T-wave inversions. Otherwise normal axis, intervals and durations;   Narrative Interpretation: Stable EKG   Other studies Reviewed: Additional studies/ records that were reviewed today include:  Recent Labs:   Lab Results  Component Value Date   CHOL 176 12/24/2014   HDL 27 (L) 12/24/2014   LDLCALC 98 12/24/2014   TRIG 253 (H) 12/24/2014   CHOLHDL 6.5 (H) 12/24/2014    ASSESSMENT / PLAN: Problem List Items Addressed This Visit    CAD S/P percutaneous coronary angioplasty: DES  PCI -Cx-OM1, RCA x 2 - Primary (Chronic)    Doing very well. No recurrent anginal symptoms. His intermittent symptoms of the chest are probably not cardiac since he is playing active sporting competitions without any issues. On stable dose of carvedilol and lisinopril. On aspirin plus Effient.  On high-dose statin.  We can switch her from Effient to Plavix as originally scheduled.      Relevant Medications   hydrochlorothiazide (HYDRODIURIL) 25 MG tablet   Other Relevant Orders   EKG 12-Lead (Completed)   Comprehensive metabolic panel   Essential hypertension (Chronic)    Borderline control today. He is at home is usually in the 130-140/90 mmHg range. I would prefer to have better control. Plan:: Add HCTZ 25 mg daily. -- He will need follow-up chemistries in roughly 2 weeks which can be when we check his lipids.      Relevant Medications   hydrochlorothiazide (HYDRODIURIL) 25 MG tablet   Hyperlipidemia with target LDL less than 70 (Chronic)    No labs since October 2016. He continues to be on high-dose statin, and now has lost weight back. Gain before.  Continues at adjusted diet and exercise. Check lipid panel and LFTs. --> Adjust therapy based on results.      Relevant Medications   hydrochlorothiazide (HYDRODIURIL) 25 MG tablet   Other Relevant Orders   Lipid panel   Comprehensive metabolic panel   Obesity, Class II, BMI 35-39.9, with comorbidity (Chronic)    Thankfully, he lost the weight back to he previous again. Continuing his exercise and has adjusted his diet. Hopefully his weight loss will continue. I congratulated him on his efforts.      ST elevation myocardial infarction (STEMI) of true posterior wall, subsequent episode of care [facility]) (Chronic)    No recurrent angina or heart failure. Preserved EF on echo. Still has EKG findings consistent with prior MI. Thankfully, no wall motion on echo.      Relevant Medications   hydrochlorothiazide (HYDRODIURIL) 25 MG tablet      Current medicines are reviewed at length with the patient today. (+/- concerns) n/a The following changes have been made: n/a  Patient Instructions  LABS IN 2 WEEKS  - [address] NOTHING TO EAT OR DRINK THE DAY OF TEST CMP LIPIDS    MEDICATION ADDITION  HCTZ ( HYDROCHLOROTHIAZIDE) 25 MG ONE TABLET  A DAILY     Your physician wants you to follow-up in 12 MONTHS WITH DR Azlin Zilberman.You will receive a reminder letter in the mail two months in advance. If you don't receive a letter, please call our office to schedule the follow-up appointment.   If you need a refill on your cardiac medications before your next appointment, please call your pharmacy.    Studies Ordered:   Orders Placed This Encounter  Procedures  . Lipid panel  . Comprehensive metabolic panel  . EKG 12-Lead      Bryan Lemma, M.D., M.S. Interventional Cardiologist   Pager # [phone] Phone # [phone] [address] [place], [place] [address]

## 2016-07-14 ENCOUNTER — Encounter: Payer: Self-pay | Admitting: Cardiology

## 2016-07-14 NOTE — Assessment & Plan Note (Signed)
Thankfully, he lost the weight back to he previous again. Continuing his exercise and has adjusted his diet. Hopefully his weight loss will continue. I congratulated him on his efforts.

## 2016-07-14 NOTE — Assessment & Plan Note (Signed)
Borderline control today. He is at home is usually in the 130-140/90 mmHg range. I would prefer to have better control. Plan:: Add HCTZ 25 mg daily. -- He will need follow-up chemistries in roughly 2 weeks which can be when we check his lipids.

## 2016-07-14 NOTE — Assessment & Plan Note (Signed)
Doing very well. No recurrent anginal symptoms. His intermittent symptoms of the chest are probably not cardiac since he is playing active sporting competitions without any issues. On stable dose of carvedilol and lisinopril. On aspirin plus Effient.  On high-dose statin.  We can switch her from Effient to Plavix as originally scheduled.

## 2016-07-14 NOTE — Assessment & Plan Note (Signed)
No labs since October 2016. He continues to be on high-dose statin, and now has lost weight back. Gain before. Continues at adjusted diet and exercise. Check lipid panel and LFTs. --> Adjust therapy based on results.

## 2016-07-14 NOTE — Assessment & Plan Note (Signed)
No recurrent angina or heart failure. Preserved EF on echo. Still has EKG findings consistent with prior MI. Thankfully, no wall motion on echo.

## 2016-07-17 DIAGNOSIS — I255 Ischemic cardiomyopathy: Secondary | ICD-10-CM

## 2016-07-17 HISTORY — DX: Ischemic cardiomyopathy: I25.5

## 2016-07-19 ENCOUNTER — Other Ambulatory Visit: Payer: Self-pay | Admitting: Cardiology

## 2016-07-20 MED FILL — LISINOPRIL 20 MG TABLET: 20 | 30 days supply | Qty: 30 | Fill #0

## 2016-07-20 MED FILL — CARVEDILOL 12.5 MG TABLET: 12.5 | 30 days supply | Qty: 60 | Fill #0

## 2016-07-31 ENCOUNTER — Encounter (HOSPITAL_BASED_OUTPATIENT_CLINIC_OR_DEPARTMENT_OTHER): Payer: Self-pay

## 2016-07-31 ENCOUNTER — Inpatient Hospital Stay (HOSPITAL_BASED_OUTPATIENT_CLINIC_OR_DEPARTMENT_OTHER)
Admission: EM | Admit: 2016-07-31 | Discharge: 2016-08-02 | DRG: 280 | Disposition: A | Discharge status: Home / Self Care | Payer: 59 | Attending: Cardiology | Admitting: Cardiology

## 2016-07-31 ENCOUNTER — Emergency Department (HOSPITAL_BASED_OUTPATIENT_CLINIC_OR_DEPARTMENT_OTHER): Payer: 59

## 2016-07-31 DIAGNOSIS — I249 Acute ischemic heart disease, unspecified: Secondary | ICD-10-CM | POA: Diagnosis not present

## 2016-07-31 DIAGNOSIS — Z955 Presence of coronary angioplasty implant and graft: Secondary | ICD-10-CM | POA: Diagnosis not present

## 2016-07-31 DIAGNOSIS — I420 Dilated cardiomyopathy: Secondary | ICD-10-CM | POA: Diagnosis present

## 2016-07-31 DIAGNOSIS — I10 Essential (primary) hypertension: Secondary | ICD-10-CM | POA: Diagnosis not present

## 2016-07-31 DIAGNOSIS — R079 Chest pain, unspecified: Secondary | ICD-10-CM | POA: Diagnosis present

## 2016-07-31 DIAGNOSIS — R072 Precordial pain: Secondary | ICD-10-CM | POA: Diagnosis not present

## 2016-07-31 DIAGNOSIS — Z9861 Coronary angioplasty status: Secondary | ICD-10-CM

## 2016-07-31 DIAGNOSIS — Z79899 Other long term (current) drug therapy: Secondary | ICD-10-CM | POA: Diagnosis not present

## 2016-07-31 DIAGNOSIS — Z6833 Body mass index (BMI) 33.0-33.9, adult: Secondary | ICD-10-CM

## 2016-07-31 DIAGNOSIS — I5041 Acute combined systolic (congestive) and diastolic (congestive) heart failure: Secondary | ICD-10-CM | POA: Diagnosis present

## 2016-07-31 DIAGNOSIS — Z87891 Personal history of nicotine dependence: Secondary | ICD-10-CM | POA: Diagnosis not present

## 2016-07-31 DIAGNOSIS — I25110 Atherosclerotic heart disease of native coronary artery with unstable angina pectoris: Secondary | ICD-10-CM

## 2016-07-31 DIAGNOSIS — F17200 Nicotine dependence, unspecified, uncomplicated: Secondary | ICD-10-CM

## 2016-07-31 DIAGNOSIS — R778 Other specified abnormalities of plasma proteins: Secondary | ICD-10-CM

## 2016-07-31 DIAGNOSIS — E669 Obesity, unspecified: Secondary | ICD-10-CM | POA: Diagnosis not present

## 2016-07-31 DIAGNOSIS — I110 Hypertensive heart disease with heart failure: Secondary | ICD-10-CM | POA: Diagnosis present

## 2016-07-31 DIAGNOSIS — I5023 Acute on chronic systolic (congestive) heart failure: Secondary | ICD-10-CM | POA: Clinically undetermined

## 2016-07-31 DIAGNOSIS — E785 Hyperlipidemia, unspecified: Secondary | ICD-10-CM | POA: Diagnosis present

## 2016-07-31 DIAGNOSIS — I252 Old myocardial infarction: Secondary | ICD-10-CM | POA: Diagnosis not present

## 2016-07-31 DIAGNOSIS — I2489 Other forms of acute ischemic heart disease: Secondary | ICD-10-CM | POA: Diagnosis present

## 2016-07-31 DIAGNOSIS — Z7902 Long term (current) use of antithrombotics/antiplatelets: Secondary | ICD-10-CM | POA: Diagnosis not present

## 2016-07-31 DIAGNOSIS — Z7982 Long term (current) use of aspirin: Secondary | ICD-10-CM | POA: Diagnosis not present

## 2016-07-31 DIAGNOSIS — E66812 Obesity, class 2: Secondary | ICD-10-CM | POA: Diagnosis present

## 2016-07-31 DIAGNOSIS — I2510 Atherosclerotic heart disease of native coronary artery without angina pectoris: Secondary | ICD-10-CM

## 2016-07-31 DIAGNOSIS — I214 Non-ST elevation (NSTEMI) myocardial infarction: Secondary | ICD-10-CM | POA: Diagnosis present

## 2016-07-31 DIAGNOSIS — R0600 Dyspnea, unspecified: Secondary | ICD-10-CM

## 2016-07-31 DIAGNOSIS — IMO0001 Reserved for inherently not codable concepts without codable children: Secondary | ICD-10-CM | POA: Diagnosis present

## 2016-07-31 DIAGNOSIS — R7989 Other specified abnormal findings of blood chemistry: Secondary | ICD-10-CM

## 2016-07-31 DIAGNOSIS — I5042 Chronic combined systolic (congestive) and diastolic (congestive) heart failure: Secondary | ICD-10-CM

## 2016-07-31 HISTORY — DX: Personal history of other medical treatment: Z92.89

## 2016-07-31 LAB — CBC WITH DIFFERENTIAL/PLATELET
BASOS ABS: 0 10*3/uL (ref 0.0–0.1)
BASOS PCT: 0 %
EOS PCT: 2 %
Eosinophils Absolute: 0.2 10*3/uL (ref 0.0–0.7)
HEMATOCRIT: 46.4 % (ref 39.0–52.0)
Hemoglobin: 16.1 g/dL (ref 13.0–17.0)
Lymphocytes Relative: 22 %
Lymphs Abs: 2.2 10*3/uL (ref 0.7–4.0)
MCH: 31.1 pg (ref 26.0–34.0)
MCHC: 34.7 g/dL (ref 30.0–36.0)
MCV: 89.6 fL (ref 78.0–100.0)
MONO ABS: 0.8 10*3/uL (ref 0.1–1.0)
Monocytes Relative: 8 %
NEUTROS ABS: 7 10*3/uL (ref 1.7–7.7)
Neutrophils Relative %: 68 %
PLATELETS: 222 10*3/uL (ref 150–400)
RBC: 5.18 MIL/uL (ref 4.22–5.81)
RDW: 13.2 % (ref 11.5–15.5)
WBC: 10.3 10*3/uL (ref 4.0–10.5)

## 2016-07-31 LAB — BASIC METABOLIC PANEL
Anion gap: 9 (ref 5–15)
BUN: 15 mg/dL (ref 6–20)
CO2: 29 mmol/L (ref 22–32)
Calcium: 9.4 mg/dL (ref 8.9–10.3)
Chloride: 98 mmol/L — ABNORMAL LOW (ref 101–111)
Creatinine, Ser: 0.89 mg/dL (ref 0.61–1.24)
GFR calc Af Amer: >60 mL/min (ref >60)
Glucose, Bld: 109 mg/dL — ABNORMAL HIGH (ref 65–99)
POTASSIUM: 3.9 mmol/L (ref 3.5–5.1)
SODIUM: 136 mmol/L (ref 135–145)

## 2016-07-31 LAB — TROPONIN I
TROPONIN I: 0.5 ng/mL — AB (ref <0.03)
Troponin I: 0.65 ng/mL (ref <0.03)

## 2016-07-31 LAB — TSH: TSH: 1.199 u[IU]/mL (ref 0.350–4.500)

## 2016-07-31 MED ORDER — SODIUM CHLORIDE 0.9 % WEIGHT BASED INFUSION: 1.0000 mL/kg/h | INTRAVENOUS | Status: DC

## 2016-07-31 MED ORDER — ASPIRIN EC 81 MG PO TBEC
81.0000 mg | DELAYED_RELEASE_TABLET | Freq: Every day | ORAL | Status: DC
  Administered 2016-08-02: 81 mg via ORAL
  Filled 2016-07-31: qty 1

## 2016-07-31 MED ORDER — NITROGLYCERIN 2 % TD OINT
0.5000 [in_us] | TOPICAL_OINTMENT | Freq: Four times a day (QID) | TRANSDERMAL | Status: DC
  Administered 2016-07-31 – 2016-08-01 (×2): 0.5 [in_us] via TOPICAL
  Filled 2016-07-31: qty 30

## 2016-07-31 MED ORDER — ASPIRIN 81 MG PO CHEW
324.0000 mg | CHEWABLE_TABLET | Freq: Once | ORAL | Status: AC
  Administered 2016-07-31: 324 mg via ORAL
  Filled 2016-07-31: qty 4

## 2016-07-31 MED ORDER — NITROGLYCERIN 0.4 MG SL SUBL: 0.4000 mg | SUBLINGUAL_TABLET | SUBLINGUAL | Status: DC | PRN

## 2016-07-31 MED ORDER — ATORVASTATIN CALCIUM 80 MG PO TABS
80.0000 mg | ORAL_TABLET | Freq: Every day | ORAL | Status: DC
Start: 2016-08-01 — End: 2016-08-02
  Administered 2016-08-01 – 2016-08-02 (×2): 80 mg via ORAL
  Filled 2016-07-31 (×2): qty 1

## 2016-07-31 MED ORDER — SODIUM CHLORIDE 0.9 % WEIGHT BASED INFUSION: 3.0000 mL/kg/h | INTRAVENOUS | Status: AC

## 2016-07-31 MED ORDER — HEPARIN (PORCINE) IN NACL 100-0.45 UNIT/ML-% IJ SOLN
1600.0000 [IU]/h | INTRAMUSCULAR | Status: DC
  Administered 2016-07-31: 1600 [IU]/h via INTRAVENOUS
  Filled 2016-07-31: qty 250

## 2016-07-31 MED ORDER — CARVEDILOL 12.5 MG PO TABS
12.5000 mg | ORAL_TABLET | Freq: Two times a day (BID) | ORAL | Status: DC
  Administered 2016-08-01 – 2016-08-02 (×3): 12.5 mg via ORAL
  Filled 2016-07-31 (×3): qty 1

## 2016-07-31 MED ORDER — LISINOPRIL 20 MG PO TABS
20.0000 mg | ORAL_TABLET | Freq: Every day | ORAL | Status: DC
  Administered 2016-08-01 – 2016-08-02 (×2): 20 mg via ORAL
  Filled 2016-07-31 (×2): qty 1

## 2016-07-31 MED ORDER — HEPARIN BOLUS VIA INFUSION
4000.0000 [IU] | Freq: Once | INTRAVENOUS | Status: AC
Start: 2016-07-31 — End: 2016-07-31
  Administered 2016-07-31: 4000 [IU] via INTRAVENOUS
  Filled 2016-07-31: qty 4000

## 2016-07-31 MED ORDER — SODIUM CHLORIDE 0.9% FLUSH: 3.0000 mL | INTRAVENOUS | Status: DC | PRN

## 2016-07-31 MED ORDER — SODIUM CHLORIDE 0.9 % IV SOLN
250.0000 mL | INTRAVENOUS | Status: DC | PRN
  Administered 2016-07-31: 250 mL via INTRAVENOUS

## 2016-07-31 MED ORDER — ONDANSETRON HCL 4 MG/2ML IJ SOLN: 4.0000 mg | Freq: Four times a day (QID) | INTRAMUSCULAR | Status: DC | PRN

## 2016-07-31 MED ORDER — ALBUTEROL SULFATE (2.5 MG/3ML) 0.083% IN NEBU: 2.5000 mg | INHALATION_SOLUTION | Freq: Four times a day (QID) | RESPIRATORY_TRACT | Status: DC | PRN

## 2016-07-31 MED ORDER — EZETIMIBE 10 MG PO TABS
10.0000 mg | ORAL_TABLET | Freq: Every day | ORAL | Status: DC
  Administered 2016-08-01 – 2016-08-02 (×2): 10 mg via ORAL
  Filled 2016-07-31 (×2): qty 1

## 2016-07-31 MED ORDER — ACETAMINOPHEN 325 MG PO TABS
650.0000 mg | ORAL_TABLET | ORAL | Status: DC | PRN
Start: 2016-07-31 — End: 2016-08-02

## 2016-07-31 MED ORDER — ALBUTEROL SULFATE HFA 108 (90 BASE) MCG/ACT IN AERS: 2.0000 | INHALATION_SPRAY | Freq: Four times a day (QID) | RESPIRATORY_TRACT | Status: DC | PRN

## 2016-07-31 MED ORDER — SODIUM CHLORIDE 0.9% FLUSH
3.0000 mL | Freq: Two times a day (BID) | INTRAVENOUS | Status: DC
  Administered 2016-07-31: 3 mL via INTRAVENOUS

## 2016-07-31 MED ORDER — ASPIRIN 81 MG PO CHEW
81.0000 mg | CHEWABLE_TABLET | ORAL | Status: AC
  Administered 2016-08-01: 81 mg via ORAL
  Filled 2016-07-31: qty 1

## 2016-07-31 NOTE — ED Provider Notes (Addendum)
[facility] Provider Note   CSN: [id number] Arrival date & time: 07/31/16  1153     History   Chief Complaint Chief Complaint  Patient presents with  . Shortness of Breath    HPI Reginald Fernandez is a 36 y.o. male.  Patient with hx htn, cad, presents c/o noting a period of labored breathing last night. States he played kickball, felt fine while playing, and that 30 minutes or so afterwards he felt labored breathing.  Currently feels breathing at baseline. Denies chest pain of discomfort. No other recent similar episodes. No associated palpitations. No nv or diaphoresis. Denies any exertional chest pain or discomfort. No leg pain or swelling. No hx dvt or pe. No orthopnea or pnd. Compliant w home meds. Occasional non prod cough. No fever or chills. Non smoker. Hx asthma, uses mdi prn.    The history is provided by the patient.    Past Medical History:  Diagnosis Date  . CAD S/P percutaneous coronary angioplasty 11/20/2013   a. 09/2009: EKG with Inf STEMI - no obstructive CAD;  b. 11/2013 Inflat STEMI/PCI: LM nl, LAD 20p, D1 sm - diff dzs, D2 large - nl, LCX 95-99, OM1 100 (3.5x38 Promus Premier DES), RCA 95-48m, 80d (3.0x20 and 3.0x16 Promus Premier DES');  c. 11/2013 Echo: Ef 55-60%, no rwma.   . Essential hypertension   . Hyperlipidemia with target LDL less than 100   . Marijuana abuse   . Migraine   . Morbid obesity (HCC)   . ST-segment elevation myocardial infarction (STEMI) of inferior wall (HCC) 11/20/2013    Patient Active Problem List   Diagnosis Date Noted  . Snoring 02/22/2014  . Daytime somnolence 02/22/2014  . Snores 11/23/2013  . Marijuana abuse   . Obesity, Class II, BMI 35-39.9, with comorbidity   . History of acute inferior wall MI 11/20/2013  . ST elevation myocardial infarction (STEMI) of true posterior wall, subsequent episode of care (HCC) 11/20/2013  . CAD S/P percutaneous coronary angioplasty: DES PCI -Cx-OM1, RCA x 2 11/20/2013    Class:  Status post  . Essential hypertension   . Hyperlipidemia with target LDL less than 70   . HYPERGLYCEMIA 10/25/2009  . ASTHMA 10/24/2009    Past Surgical History:  Procedure Laterality Date  . CARDIAC CATHETERIZATION  09/2009;    Mild RCA luminal Irregularities - MIld INf HK -- Med Rx; b)  . LEFT HEART CATHETERIZATION WITH CORONARY ANGIOGRAM N/A 11/20/2013   Procedure: LEFT HEART CATHETERIZATION WITH CORONARY ANGIOGRAM;  Surgeon: Marykay Lex, MD;  Location: [facility] CATH LAB;  Service: Cardiovascular;   Inf STEMI: OM1 100%, m-dRCA 95-99% thrombotic mRCA with tandem 80% -- PCI, LAD & Cx relatively normal.  . PERCUTANEOUS CORONARY STENT INTERVENTION (PCI-S)  11/20/2013   a) PCI - OM1 Promus Premier DES 3.5 mm x 38 mm, m-d RCA Promus Premier DES  3.0 mm x 20 mm & 3.0 mm x 16 mm  . TRANSTHORACIC ECHOCARDIOGRAM  11/20/2013   LV EF 55-60%. No regional wall motion abnormality.  Normal valves        Home Medications    Prior to Admission medications   Medication Sig Start Date End Date Taking? Authorizing Provider  Acetaminophen (TYLENOL PO) Take 2 tablets by mouth 2 (two) times daily as needed (pain/ headache).   Yes [provider]  albuterol (PROVENTIL HFA;VENTOLIN HFA) 108 (90 BASE) MCG/ACT inhaler Inhale 2 puffs into the lungs every 6 (six) hours as needed for wheezing or shortness  of breath.   Yes [provider]  aspirin 81 MG tablet Take 81 mg by mouth as needed for pain.   Yes [provider]  atorvastatin (LIPITOR) 80 MG tablet TAKE 1 TABLET BY MOUTH ONCE DAILY AT 6 PM. 07/27/15  Yes Marykay Lex, MD  carvedilol (COREG) 12.5 MG tablet Take 1 tablet (12.5 mg total) by mouth 2 (two) times daily with a meal. 07/20/16  Yes Marykay Lex, MD  clopidogrel (PLAVIX) 75 MG tablet Take 75 mg by mouth daily.   Yes [provider]  ezetimibe (ZETIA) 10 MG tablet TAKE 1 TABLET BY MOUTH DAILY. 07/20/16  Yes Marykay Lex, MD  hydrochlorothiazide (HYDRODIURIL)  25 MG tablet Take 1 tablet (25 mg total) by mouth daily. 07/12/16  Yes Marykay Lex, MD  lisinopril (PRINIVIL,ZESTRIL) 20 MG tablet Take 1 tablet (20 mg total) by mouth daily. 07/20/16  Yes Marykay Lex, MD  nitroGLYCERIN (NITROSTAT) 0.4 MG SL tablet Place 1 tablet (0.4 mg total) under the tongue every 5 (five) minutes as needed for chest pain. 11/23/13  Yes Ok Anis, NP  EFFIENT 10 MG TABS tablet TAKE 1 TABLET BY MOUTH ONCE DAILY 06/17/15   Marykay Lex, MD    Family History Family History  Problem Relation Age of Onset  . Heart attack Father 1       2 MIs by age 65 -- CABG  . Hypertension Father   . Heart failure Father   . Hyperlipidemia Father   . Hypertension Mother     Social History Social History  Substance Use Topics  . Smoking status: Former Smoker    Quit date: 11/20/2009  . Smokeless tobacco: Never Used  . Alcohol use No     Allergies   Penicillins   Review of Systems Review of Systems  Constitutional: Negative for fever.  HENT: Negative for sore throat.   Eyes: Negative for redness.  Respiratory: Positive for shortness of breath.   Cardiovascular: Negative for chest pain and leg swelling.  Gastrointestinal: Negative for abdominal pain.  Genitourinary: Negative for flank pain.  Musculoskeletal: Negative for back pain and neck pain.  Skin: Negative for rash.  Neurological: Negative for headaches.  Hematological: Does not bruise/bleed easily.  Psychiatric/Behavioral: Negative for confusion.     Physical Exam Updated Vital Signs Wt 118.6 kg   BMI 33.56 kg/m   Physical Exam  Constitutional: He appears well-developed and well-nourished. No distress.  HENT:  Mouth/Throat: Oropharynx is clear and moist.  Eyes: Conjunctivae are normal.  Neck: Neck supple. No JVD present. No tracheal deviation present. No thyromegaly present.  Cardiovascular: Normal rate, regular rhythm, normal heart sounds and intact distal pulses.  Exam reveals no  gallop and no friction rub.   No murmur heard. Pulmonary/Chest: Effort normal and breath sounds normal. No accessory muscle usage. No respiratory distress.  Abdominal: Soft. He exhibits no distension. There is no tenderness.  Musculoskeletal: He exhibits no edema or tenderness.  Neurological: He is alert.  Skin: Skin is warm and dry.  Psychiatric: He has a normal mood and affect.  Nursing note and vitals reviewed.    ED Treatments / Results  Labs (all labs ordered are listed, but only abnormal results are displayed) Results for orders placed or performed during the hospital encounter of 07/31/16  Basic metabolic panel  Result Value Ref Range   Sodium 136 135 - 145 mmol/L   Potassium 3.9 3.5 - 5.1 mmol/L   Chloride 98 (L)  101 - 111 mmol/L   CO2 29 22 - 32 mmol/L   Glucose, Bld 109 (H) 65 - 99 mg/dL   BUN 15 6 - 20 mg/dL   Creatinine, Ser 1.19 0.61 - 1.24 mg/dL   Calcium 9.4 8.9 - 14.7 mg/dL   GFR calc non Af Amer >60 >60 mL/min   GFR calc Af Amer >60 >60 mL/min   Anion gap 9 5 - 15  Troponin I  Result Value Ref Range   Troponin I 0.50 (HH) <0.03 ng/mL  CBC with Differential  Result Value Ref Range   WBC 10.3 4.0 - 10.5 K/uL   RBC 5.18 4.22 - 5.81 MIL/uL   Hemoglobin 16.1 13.0 - 17.0 g/dL   HCT 82.9 56.2 - 13.0 %   MCV 89.6 78.0 - 100.0 fL   MCH 31.1 26.0 - 34.0 pg   MCHC 34.7 30.0 - 36.0 g/dL   RDW 86.5 78.4 - 69.6 %   Platelets 222 150 - 400 K/uL   Neutrophils Relative % 68 %   Neutro Abs 7.0 1.7 - 7.7 K/uL   Lymphocytes Relative 22 %   Lymphs Abs 2.2 0.7 - 4.0 K/uL   Monocytes Relative 8 %   Monocytes Absolute 0.8 0.1 - 1.0 K/uL   Eosinophils Relative 2 %   Eosinophils Absolute 0.2 0.0 - 0.7 K/uL   Basophils Relative 0 %   Basophils Absolute 0.0 0.0 - 0.1 K/uL   Dg Chest 2 View  Result Date: 07/31/2016 CLINICAL DATA:  Chest tightness.  Shortness of breath. EXAM: CHEST  2 VIEW COMPARISON:  11/20/2013. FINDINGS: Mediastinum hilar structures normal. Cardiomegaly  with normal pulmonary vascularity. No significant pulmonary edema noted on today's exam. No pleural effusion or pneumothorax. IMPRESSION: Stable cardiomegaly.  No evidence of pulmonary edema. Electronically Signed   By: Maisie Fus  Register   On: 07/31/2016 12:32    EKG  EKG Interpretation  Date/Time:  Tuesday Jul 31 2016 12:15:31 EDT Ventricular Rate:  90 PR Interval:  170 QRS Duration: 106 QT Interval:  370 QTC Calculation: 452 R Axis:   -174 Text Interpretation:  Normal sinus rhythm Nonspecific T wave abnormality No significant change since last tracing Confirmed by Denton Lank  MD, Caryn Bee ([id number]) on 07/31/2016 12:53:56 PM       Radiology Dg Chest 2 View  Result Date: 07/31/2016 CLINICAL DATA:  Chest tightness.  Shortness of breath. EXAM: CHEST  2 VIEW COMPARISON:  11/20/2013. FINDINGS: Mediastinum hilar structures normal. Cardiomegaly with normal pulmonary vascularity. No significant pulmonary edema noted on today's exam. No pleural effusion or pneumothorax. IMPRESSION: Stable cardiomegaly.  No evidence of pulmonary edema. Electronically Signed   By: Maisie Fus  Register   On: 07/31/2016 12:32    Procedures Procedures (including critical care time)  Medications Ordered in ED Medications - No data to display   Initial Impression / Assessment and Plan / ED Course  I have reviewed the triage vital signs and the nursing notes.  Pertinent labs & imaging results that were available during my care of the patient were reviewed by me and considered in my medical decision making (see chart for details).  cxr. Labs.  Reviewed nursing notes and prior charts for additional history.   Pt currently breathing at baseline. No cp or discomfort. Pulse ox 99% room air. rr 14.   Labs pending.  Trop is elevated .5 - pt indicates his symptoms last night were similar to, although not as bad as, when he had prior cardiac cp/stents.   Given  elevated trop, and similar clinical symptoms to his prior ACS  presentation (although milder) will admit to cardiology at [facility].   Asa po.  Discussed pt, elev trop, with Dr Elease Hashimoto - he accepts in transfer to [facility], 3W  Final Clinical Impressions(s) / ED Diagnoses   Final diagnoses:  None    New Prescriptions New Prescriptions   No medications on file     Cathren Laine, MD 07/31/16 1300    Cathren Laine, MD 07/31/16 1318

## 2016-07-31 NOTE — ED Notes (Signed)
Patients keys left in security for wife to pick up per his verbal consent.

## 2016-07-31 NOTE — ED Notes (Signed)
Date and time results received: 07/31/16 1258 (use smartphrase ".now" to insert current time)  Test: Troponin Critical Value: 0.50 Troponin  Name of Provider Notified: Steinl  Orders Received? Or Actions Taken?: Dr. Denton Lank notified

## 2016-07-31 NOTE — Discharge Instructions (Signed)
Transfer to [facility], 3W

## 2016-07-31 NOTE — H&P (Signed)
History & Physical    Patient ID: Reginald Fernandez MRN: [id number], DOB/AGE: April 29, 1980   Admit date: 07/31/2016  Primary Physician: Patient, No Pcp Per Primary Cardiologist: Ranae Palms, MD   Patient Profile    36 y/o ? with a h/o CAD s/p prior LCX/OM and RCA stenting, HTN, HL, and obesity, who presents on tx from [facility] 2/2 c/p and elevated trop.  Past Medical History    Past Medical History:  Diagnosis Date  . CAD S/P percutaneous coronary angioplasty 11/20/2013   a. 09/2009: EKG with Inf STEMI - no obstructive CAD;  b. 11/2013 Inflat STEMI/PCI: LM nl, LAD 20p, D1 sm - diff dzs, D2 large - nl, LCX 95-99, OM1 100 (3.5x38 Promus Premier DES), RCA 95-43m, 80d (3.0x20 and 3.0x16 Promus Premier DES');  c. 11/2013 Echo: Ef 55-60%, no rwma.   . Essential hypertension   . History of echocardiogram    a. 11/2016 Echo: EF 55-60%, no rwma.  . Hyperlipidemia with target LDL less than 100   . Marijuana abuse   . Migraine   . Morbid obesity (HCC)   . ST-segment elevation myocardial infarction (STEMI) of inferior wall (HCC) 11/20/2013    Past Surgical History:  Procedure Laterality Date  . CARDIAC CATHETERIZATION  09/2009;    Mild RCA luminal Irregularities - MIld INf HK -- Med Rx; b)  . LEFT HEART CATHETERIZATION WITH CORONARY ANGIOGRAM N/A 11/20/2013   Procedure: LEFT HEART CATHETERIZATION WITH CORONARY ANGIOGRAM;  Surgeon: Marykay Lex, MD;  Location: [facility] CATH LAB;  Service: Cardiovascular;   Inf STEMI: OM1 100%, m-dRCA 95-99% thrombotic mRCA with tandem 80% -- PCI, LAD & Cx relatively normal.  . PERCUTANEOUS CORONARY STENT INTERVENTION (PCI-S)  11/20/2013   a) PCI - OM1 Promus Premier DES 3.5 mm x 38 mm, m-d RCA Promus Premier DES  3.0 mm x 20 mm & 3.0 mm x 16 mm  . TRANSTHORACIC ECHOCARDIOGRAM  11/20/2013   LV EF 55-60%. No regional wall motion abnormality.  Normal valves      Allergies  Allergies  Allergen Reactions  . Penicillins Other (See Comments)    unknown    History of  Present Illness    36 y/o ? with the above complex PMH including CAD s/p inf STEMI in 2011 w/ cath @ that time revealing nonobs dzs.  He re-presented in 2015 and was found to have severe LCX, OM1, and RCA dzs.  Those areas were successfully treated with DES'.  He has done well over the years and was in his usoh until last night, after competing in an adult [place] game, he and his wife went out with some teammates and upon arrival to [employer], he had sudden feeling of sscp/tightness with malaise, and a sense of feeling very hot.  He noted that his HR was ~ 120 bpm.  He went back outside and sat in his car with the A/C on, and after about 10 mins, his chest tightness resolved and he no longer felt hot.  He went back into the restaurant and continued to feel fatigued and w/ malaise.  He later went home and went to bed.  He had no recurrent c/p overnight but upon awakening this am, he cont to feel malaise and fatigue.  This was similar to the way he felt after his last MI, and so he presented to [facility] where ECG was non-acute, however troponin was elevated @ 0.50.  He was tx to [facility] for further  eval.  He is currently c/p free and says he no longer feels fatigue/malaise.  Home Medications    Prior to Admission medications   Medication Sig Start Date End Date Taking? Authorizing Provider  Acetaminophen (TYLENOL PO) Take 2 tablets by mouth 2 (two) times daily as needed (pain/ headache).   Yes [provider]  albuterol (PROVENTIL HFA;VENTOLIN HFA) 108 (90 BASE) MCG/ACT inhaler Inhale 2 puffs into the lungs every 6 (six) hours as needed for wheezing or shortness of breath.   Yes [provider]  aspirin 81 MG tablet Take 81 mg by mouth as needed for pain.   Yes [provider]  atorvastatin (LIPITOR) 80 MG tablet TAKE 1 TABLET BY MOUTH ONCE DAILY AT 6 PM. 07/27/15  Yes Marykay Lex, MD  carvedilol (COREG) 12.5 MG tablet Take 1 tablet (12.5 mg total) by mouth 2  (two) times daily with a meal. 07/20/16  Yes Marykay Lex, MD  clopidogrel (PLAVIX) 75 MG tablet Take 75 mg by mouth daily.   Yes [provider]  ezetimibe (ZETIA) 10 MG tablet TAKE 1 TABLET BY MOUTH DAILY. 07/20/16  Yes Marykay Lex, MD  hydrochlorothiazide (HYDRODIURIL) 25 MG tablet Take 1 tablet (25 mg total) by mouth daily. 07/12/16  Yes Marykay Lex, MD  lisinopril (PRINIVIL,ZESTRIL) 20 MG tablet Take 1 tablet (20 mg total) by mouth daily. 07/20/16  Yes Marykay Lex, MD  nitroGLYCERIN (NITROSTAT) 0.4 MG SL tablet Place 1 tablet (0.4 mg total) under the tongue every 5 (five) minutes as needed for chest pain. 11/23/13  Yes Ok Anis, NP    Family History    Family History  Problem Relation Age of Onset  . Heart attack Father 22       2 MIs by age 7 -- CABG  . Hypertension Father   . Heart failure Father   . Hyperlipidemia Father   . Hypertension Mother     Social History    Social History   Social History  . Marital status: Married    Spouse name: N/A  . Number of children: 2  . Years of education: N/A   Occupational History  .  [place] [employer]   Social History Main Topics  . Smoking status: Former Smoker    Quit date: 11/20/2009  . Smokeless tobacco: Never Used  . Alcohol use No  . Drug use: No  . Sexual activity: Not on file   Other Topics Concern  . Not on file   Social History Narrative   Lives in [place] with wife.  Works with mentally challenged adults.     Review of Systems    General:  Felt hot last night for about 10 mins.  Has felt fatigued/malaise since.  No chills, fever, night sweats or weight changes.  Cardiovascular:  +++ chest tightness, no dyspnea on exertion, edema, orthopnea, palpitations, paroxysmal nocturnal dyspnea. Dermatological: No rash, lesions/masses Respiratory: No cough, dyspnea Urologic: No hematuria, dysuria Abdominal:   No nausea, vomiting, diarrhea, bright red blood per  rectum, melena, or hematemesis Neurologic:  No visual changes, wkns, changes in mental status. All other systems reviewed and are otherwise negative except as noted above.  Physical Exam    Blood pressure (!) 127/97, pulse 80, temperature 98 F (36.7 C), temperature source Oral, resp. rate 18, height 6\' 2"  (1.88 m), weight 258 lb 6.4 oz (117.2 kg), SpO2 100 %.  General: Pleasant, NAD Psych: Normal affect. Neuro:  Alert and oriented X 3. Moves all extremities spontaneously. HEENT: Normal  Neck: Supple without bruits or JVD. Lungs:  Resp regular and unlabored, CTA. Heart: RRR no s3, s4, or murmurs. Abdomen: Soft, non-tender, non-distended, BS + x 4.  Extremities: No clubbing, cyanosis or edema. DP/PT/Radials 2+ and equal bilaterally.  Labs     Recent Labs  07/31/16 1218  TROPONINI 0.50*   Lab Results  Component Value Date   WBC 10.3 07/31/2016   HGB 16.1 07/31/2016   HCT 46.4 07/31/2016   MCV 89.6 07/31/2016   PLT 222 07/31/2016     Recent Labs Lab 07/31/16 1218  NA 136  K 3.9  CL 98*  CO2 29  BUN 15  CREATININE 0.89  CALCIUM 9.4  GLUCOSE 109*   Lab Results  Component Value Date   CHOL 176 12/24/2014   HDL 27 (L) 12/24/2014   LDLCALC 98 12/24/2014   TRIG 253 (H) 12/24/2014     Radiology Studies    Dg Chest 2 View  Result Date: 07/31/2016 CLINICAL DATA:  Chest tightness.  Shortness of breath. EXAM: CHEST  2 VIEW COMPARISON:  11/20/2013. FINDINGS: Mediastinum hilar structures normal. Cardiomegaly with normal pulmonary vascularity. No significant pulmonary edema noted on today's exam. No pleural effusion or pneumothorax. IMPRESSION: Stable cardiomegaly.  No evidence of pulmonary edema. Electronically Signed   By: Maisie Fus  Register   On: 07/31/2016 12:32    ECG & Cardiac Imaging    Rsr, 90, inf infarct, twi ii, iii, avf, v6 - no acute changes.  Assessment & Plan    1.  NSTEMI:  Pt with a h/o CAD s/p infpost MI in 2015 req DES to the LCX/OM1 and RCA now  presents on tx from [facility] after a 10 min episode of chest tightness on the evening of 5/14 followed by generalized malaise and fatigue.  At [facility], ecg was non-acute, however troponin was elevated @ 0.50.  He is currently symptom free.  Admit and cycle CE.  Add heparin.  Cont asa, statin,  blocker, acei.  Add nitropaste.  Plan cath in AM.  The patient understands that risks include but are not limited to stroke (1 in 1000), death (1 in 1000), kidney failure [usually temporary] (1 in 500), bleeding (1 in 200), allergic reaction [possibly serious] (1 in 200), and agrees to proceed.   2.  Essential HTN:  BP relatively stable.  Resume home meds and follow.   3.  HL:  LDL 98 in 2016.  Cont high potency statin and zetia.  Will likely need PCSK9 inhibitor.  4.  Nicotine vaporizer use:  Cessation advised.  5.  Obesity:  Will benefit from cardiac rehab.  Signed, Nicolasa Ducking, NP 07/31/2016, 6:53 PM  I have seen, examined and evaluated the patient this pm along with Mr. Brion Aliment, NP.  After reviewing all the available data and chart, we discussed the patients laboratory, study & physical findings as well as symptoms in detail. I agree with his findings, examination as well as impression recommendations as per our discussion.    Theodor is well known to me with his history of rapid progressive CAD requiring 2 vessel PCI 2014 in the setting of STEMI. This was 3 years after a cardiac catheterization for possible inferior STEMI the did not show any significant disease. He now presents with what seems like probably a short episode of unstable angina/mild non-STEMI yesterday/last night after an extremely vigorous sporting event.  At this point I see no other recourse than  to place on IV heparin, with plans for cardiac catheterization tomorrow. I had recently stopped his Effient (he was intolerant of Brilinta) which we will continue to hold for now pending results of the catheterization. Given his history of rapidly  progressing disease, there is still a possibility of multivessel CAD.  We talked about his lipid management. We can check lipid panels while he is here. There was some financial issues with Zetia, but he was not at goal with Zetia plus statin. I think you probably will be PCSK9 inhibitor candidate to be seen in outpatient CVRR clinic.  Continue home BP Meds.  -FOR CATH TOMORROW - more plans based on results   Bryan Lemma, M.D., M.S. Interventional Cardiologist   Pager # [phone] Phone # [phone] [address] [place], [place] [address]

## 2016-07-31 NOTE — Progress Notes (Signed)
ANTICOAGULATION CONSULT NOTE - Initial Consult  Pharmacy Consult for Heparin Indication: chest pain/ACS  Allergies  Allergen Reactions  . Penicillins Other (See Comments)    unknown    Patient Measurements: Height: 6\' 2"  (188 cm) Weight: 258 lb 6.4 oz (117.2 kg) IBW/kg (Calculated) : 82.2 Heparin Dosing Weight: 109  Vital Signs: Temp: 98 F (36.7 C) (05/15 1716) Temp Source: Oral (05/15 1716) BP: 127/97 (05/15 1716) Pulse Rate: 80 (05/15 1600)  Labs:  Recent Labs  07/31/16 1218  HGB 16.1  HCT 46.4  PLT 222  CREATININE 0.89  TROPONINI 0.50*    Estimated Creatinine Clearance: 156.1 mL/min (by C-G formula based on SCr of 0.89 mg/dL).   Medical History: Past Medical History:  Diagnosis Date  . CAD S/P percutaneous coronary angioplasty 11/20/2013   a. 09/2009: EKG with Inf STEMI - no obstructive CAD;  b. 11/2013 Inflat STEMI/PCI: LM nl, LAD 20p, D1 sm - diff dzs, D2 large - nl, LCX 95-99, OM1 100 (3.5x38 Promus Premier DES), RCA 95-63m, 80d (3.0x20 and 3.0x16 Promus Premier DES');  c. 11/2013 Echo: Ef 55-60%, no rwma.   . Essential hypertension   . History of echocardiogram    a. 11/2016 Echo: EF 55-60%, no rwma.  . Hyperlipidemia with target LDL less than 100   . Marijuana abuse   . Migraine   . Morbid obesity (HCC)   . ST-segment elevation myocardial infarction (STEMI) of inferior wall (HCC) 11/20/2013    Assessment: 36 year old male to begin heparin for ACS Cath planned for AM  Goal of Therapy:  Heparin level 0.3-0.7 units/ml Monitor platelets by anticoagulation protocol: Yes   Plan:  Heparin 4000 units iv bolus x 1 Heparin drip at 1600 units / hr AM heparin level, CBC  Thank you Okey Regal, PharmD [phone]  Elwin Sleight 07/31/2016,7:06 PM

## 2016-07-31 NOTE — Progress Notes (Signed)
CRITICAL VALUE ALERT  Critical value received:  Troponin 0.65  Date of notification:  07/31/16  Time of notification:  2100  Critical value read back:Yes.    Nurse who received alert:  Merry Proud, RN  MD notified (1st page):  Dr. Armanda Magic  Time of first page:  2108  MD notified (2nd page):  Time of second page:  Responding MD:  Dr. Armanda Magic  Time MD responded:  2140

## 2016-07-31 NOTE — ED Notes (Signed)
Computer malfunction in triage-pt taken to tx room 5 for EKG and triage completion-K Haskins RN given report

## 2016-07-31 NOTE — ED Triage Notes (Signed)
C/o SHOB that started last night while playing kickball-NAD-steady gait

## 2016-08-01 ENCOUNTER — Encounter (HOSPITAL_COMMUNITY): Payer: Self-pay | Admitting: Cardiovascular Disease

## 2016-08-01 ENCOUNTER — Encounter (HOSPITAL_COMMUNITY)
Admission: EM | Disposition: A | Discharge status: Home / Self Care | Payer: Self-pay | Source: Home / Self Care | Attending: Cardiology

## 2016-08-01 ENCOUNTER — Inpatient Hospital Stay (HOSPITAL_COMMUNITY): Payer: 59

## 2016-08-01 DIAGNOSIS — I5023 Acute on chronic systolic (congestive) heart failure: Secondary | ICD-10-CM | POA: Clinically undetermined

## 2016-08-01 DIAGNOSIS — I5042 Chronic combined systolic (congestive) and diastolic (congestive) heart failure: Secondary | ICD-10-CM

## 2016-08-01 DIAGNOSIS — I5041 Acute combined systolic (congestive) and diastolic (congestive) heart failure: Secondary | ICD-10-CM

## 2016-08-01 DIAGNOSIS — Z9861 Coronary angioplasty status: Secondary | ICD-10-CM

## 2016-08-01 DIAGNOSIS — I2510 Atherosclerotic heart disease of native coronary artery without angina pectoris: Secondary | ICD-10-CM

## 2016-08-01 DIAGNOSIS — R072 Precordial pain: Secondary | ICD-10-CM

## 2016-08-01 DIAGNOSIS — I420 Dilated cardiomyopathy: Secondary | ICD-10-CM

## 2016-08-01 HISTORY — PX: LEFT HEART CATH AND CORONARY ANGIOGRAPHY: CATH118249

## 2016-08-01 LAB — BASIC METABOLIC PANEL
ANION GAP: 8 (ref 5–15)
BUN: 13 mg/dL (ref 6–20)
CALCIUM: 8.7 mg/dL — AB (ref 8.9–10.3)
CO2: 25 mmol/L (ref 22–32)
Chloride: 103 mmol/L (ref 101–111)
Creatinine, Ser: 0.77 mg/dL (ref 0.61–1.24)
GFR calc Af Amer: >60 mL/min (ref >60)
GFR calc non Af Amer: >60 mL/min (ref >60)
GLUCOSE: 115 mg/dL — AB (ref 65–99)
Potassium: 3.9 mmol/L (ref 3.5–5.1)
SODIUM: 136 mmol/L (ref 135–145)

## 2016-08-01 LAB — CBC
HEMATOCRIT: 44.8 % (ref 39.0–52.0)
HEMOGLOBIN: 15 g/dL (ref 13.0–17.0)
MCH: 30.2 pg (ref 26.0–34.0)
MCHC: 33.5 g/dL (ref 30.0–36.0)
MCV: 90.3 fL (ref 78.0–100.0)
Platelets: 215 10*3/uL (ref 150–400)
RBC: 4.96 MIL/uL (ref 4.22–5.81)
RDW: 13.1 % (ref 11.5–15.5)
WBC: 8.8 10*3/uL (ref 4.0–10.5)

## 2016-08-01 LAB — HEPARIN LEVEL (UNFRACTIONATED)
HEPARIN UNFRACTIONATED: 0.35 [IU]/mL (ref 0.30–0.70)
Heparin Unfractionated: 0.34 IU/mL (ref 0.30–0.70)

## 2016-08-01 LAB — PROTIME-INR
INR: 1.06
Prothrombin Time: 13.8 seconds (ref 11.4–15.2)

## 2016-08-01 LAB — LIPID PANEL
CHOL/HDL RATIO: 5.6 ratio
Cholesterol: 140 mg/dL (ref 0–200)
HDL: 25 mg/dL — AB (ref >40)
LDL CALC: 86 mg/dL (ref 0–99)
TRIGLYCERIDES: 147 mg/dL (ref <150)
VLDL: 29 mg/dL (ref 0–40)

## 2016-08-01 LAB — ECHOCARDIOGRAM COMPLETE
Height: 74 in
WEIGHTICAEL: 4120 [oz_av]

## 2016-08-01 LAB — TROPONIN I
TROPONIN I: 0.7 ng/mL — AB (ref <0.03)
TROPONIN I: 0.59 ng/mL — AB (ref <0.03)

## 2016-08-01 SURGERY — LEFT HEART CATH AND CORONARY ANGIOGRAPHY
Anesthesia: LOCAL

## 2016-08-01 MED ORDER — VERAPAMIL HCL 2.5 MG/ML IV SOLN
INTRAVENOUS | Status: AC
  Filled 2016-08-01: qty 2

## 2016-08-01 MED ORDER — IOPAMIDOL (ISOVUE-370) INJECTION 76%
INTRAVENOUS | Status: DC | PRN
  Administered 2016-08-01: 90 mL via INTRA_ARTERIAL

## 2016-08-01 MED ORDER — LIDOCAINE HCL 1 % IJ SOLN
INTRAMUSCULAR | Status: AC
  Filled 2016-08-01: qty 20

## 2016-08-01 MED ORDER — CLOPIDOGREL BISULFATE 75 MG PO TABS
75.0000 mg | ORAL_TABLET | Freq: Every day | ORAL | Status: DC
  Administered 2016-08-01 – 2016-08-02 (×2): 75 mg via ORAL
  Filled 2016-08-01 (×2): qty 1

## 2016-08-01 MED ORDER — LIDOCAINE HCL (PF) 1 % IJ SOLN
INTRAMUSCULAR | Status: DC | PRN
  Administered 2016-08-01: 2 mL via INTRADERMAL

## 2016-08-01 MED ORDER — SODIUM CHLORIDE 0.9% FLUSH
3.0000 mL | Freq: Two times a day (BID) | INTRAVENOUS | Status: DC
  Administered 2016-08-01: 3 mL via INTRAVENOUS

## 2016-08-01 MED ORDER — HEPARIN (PORCINE) IN NACL 2-0.9 UNIT/ML-% IJ SOLN
INTRAMUSCULAR | Status: AC
  Filled 2016-08-01: qty 1000

## 2016-08-01 MED ORDER — MIDAZOLAM HCL 2 MG/2ML IJ SOLN
INTRAMUSCULAR | Status: DC | PRN
  Administered 2016-08-01 (×2): 2 mg via INTRAVENOUS

## 2016-08-01 MED ORDER — FENTANYL CITRATE (PF) 100 MCG/2ML IJ SOLN
INTRAMUSCULAR | Status: AC
  Filled 2016-08-01: qty 2

## 2016-08-01 MED ORDER — SODIUM CHLORIDE 0.9 % IV SOLN: 250.0000 mL | INTRAVENOUS | Status: DC | PRN

## 2016-08-01 MED ORDER — SODIUM CHLORIDE 0.9% FLUSH: 3.0000 mL | INTRAVENOUS | Status: DC | PRN

## 2016-08-01 MED ORDER — MIDAZOLAM HCL 2 MG/2ML IJ SOLN
INTRAMUSCULAR | Status: AC
  Filled 2016-08-01: qty 2

## 2016-08-01 MED ORDER — HEPARIN SODIUM (PORCINE) 1000 UNIT/ML IJ SOLN
INTRAMUSCULAR | Status: AC
  Filled 2016-08-01: qty 1

## 2016-08-01 MED ORDER — SPIRONOLACTONE 25 MG PO TABS
25.0000 mg | ORAL_TABLET | Freq: Every day | ORAL | Status: DC
  Administered 2016-08-01 – 2016-08-02 (×2): 25 mg via ORAL
  Filled 2016-08-01 (×2): qty 1

## 2016-08-01 MED ORDER — HEPARIN SODIUM (PORCINE) 1000 UNIT/ML IJ SOLN
INTRAMUSCULAR | Status: DC | PRN
  Administered 2016-08-01: 6000 [IU] via INTRAVENOUS

## 2016-08-01 MED ORDER — SODIUM CHLORIDE 0.9 % IV SOLN: INTRAVENOUS | Status: AC

## 2016-08-01 MED ORDER — FENTANYL CITRATE (PF) 100 MCG/2ML IJ SOLN
INTRAMUSCULAR | Status: DC | PRN
  Administered 2016-08-01 (×2): 25 ug via INTRAVENOUS

## 2016-08-01 MED ORDER — HEPARIN (PORCINE) IN NACL 2-0.9 UNIT/ML-% IJ SOLN
INTRAMUSCULAR | Status: AC | PRN
  Administered 2016-08-01: 1000 mL

## 2016-08-01 MED ORDER — VERAPAMIL HCL 2.5 MG/ML IV SOLN
INTRAVENOUS | Status: DC | PRN
  Administered 2016-08-01: 10 mL via INTRA_ARTERIAL

## 2016-08-01 MED ORDER — IOPAMIDOL (ISOVUE-370) INJECTION 76%
INTRAVENOUS | Status: AC
  Filled 2016-08-01: qty 100

## 2016-08-01 SURGICAL SUPPLY — 10 items

## 2016-08-01 NOTE — Interval H&P Note (Signed)
Cath Lab Visit (complete for each Cath Lab visit)  Clinical Evaluation Leading to the Procedure:   ACS: Yes.    Non-ACS:    Anginal Classification: CCS IV  Anti-ischemic medical therapy: Minimal Therapy (1 class of medications)  Non-Invasive Test Results: No non-invasive testing performed  Prior CABG: No previous CABG      History and Physical Interval Note:  08/01/2016 9:56 AM  [name]  has presented today for surgery, with the diagnosis of NSTEMI  The various methods of treatment have been discussed with the patient and family. After consideration of risks, benefits and other options for treatment, the patient has consented to  Procedure(s): Left Heart Cath and Coronary Angiography (N/A) as a surgical intervention .  The patient's history has been reviewed, patient examined, no change in status, stable for surgery.  I have reviewed the patient's chart and labs.  Questions were answered to the patient's satisfaction.     Reginald Fernandez

## 2016-08-01 NOTE — Progress Notes (Signed)
Responded to consult to create advanced directive. Explained/discussed options on form. Pt will ask nurse to page for a chaplain again if he wishes to complete form before discharge. Provided spiritual/emotional support -- and brief prayer of blessing, as nurse was coming in for various ministrations. Chaplain available for f/u.   08/01/16 1300  Clinical Encounter Type  Visited With Patient;Health care provider  Visit Type Initial;Psychological support;Spiritual support;Social support  Referral From Nurse  Spiritual Encounters  Spiritual Needs Brochure;Prayer;Emotional  Stress Factors  Patient Stress Factors Health changes;Loss of control   Ephraim Hamburger, [address]

## 2016-08-01 NOTE — Discharge Summary (Signed)
Discharge Summary    Patient ID: Reginald Fernandez,  MRN: [id number], DOB/AGE: 1980-09-16 36 y.o.  Admit date: 07/31/2016 Discharge date: 08/02/2016  Primary Care Provider: Patient, No Pcp Per Primary Cardiologist: Reginald Fernandez  Discharge Diagnoses    Principal Problem:   ACS (acute coronary syndrome) [facility]) Active Problems:   Essential hypertension   CAD S/P percutaneous coronary angioplasty: DES PCI -Cx-OM1, RCA x 2   NSTEMI (non-ST elevated myocardial infarction) (HCC)   Dilated cardiomyopathy (HCC)   Acute combined systolic and diastolic heart failure (HCC)   Allergies Allergies  Allergen Reactions  . Penicillins Other (See Comments)    unknown    Diagnostic Studies/Procedures    LHC: 08/01/16  Conclusion   1. Two-vessel coronary artery disease with continued patency of the stented segments in the right coronary artery and obtuse marginal branch of the circumflex 2. Diffuse coronary irregularity/nonobstructive disease stable from previous angiogram in 2015 3. Moderate segmental LV dysfunction with normal LVEDP  Recommendation: The patient appears to have stable coronary anatomy from his previous study. I cannot identify a culprit lesion for non-ST elevation infarction. Would consider addition of Plavix 75 mg daily for medical treatment of acute coronary syndrome. Otherwise he should continue on his current medical regimen. Will check an echo to better assess LV function.   TTE: 08/01/16  Study Conclusions  - Left ventricle: The cavity size was moderately dilated. Wall   thickness was normal. Basal anterolateral hypokinesis. Basal to   mid inferolateral akinesis. Mid to apical inferior akinesis.   Systolic function was moderately reduced. The estimated ejection   fraction was in the range of 35% to 40%. Features are consistent   with a pseudonormal left ventricular filling pattern, with   concomitant abnormal relaxation and increased filling pressure   (grade 2  diastolic dysfunction). - Aortic valve: There was no stenosis. - Aorta: Borderline dilated aortic root. Aortic root dimension: 37   mm (ED). - Mitral valve: There was no significant regurgitation. - Left atrium: The atrium was moderately dilated. - Right ventricle: The cavity size was normal. Systolic function   was normal. - Right atrium: The atrium was mildly dilated. - Pulmonary arteries: No complete TR doppler jet so unable to   estimate PA systolic pressure. - Inferior vena cava: The vessel was normal in size. The   respirophasic diameter changes were in the normal range (>= 50%),   consistent with normal central venous pressure.  Impressions:  - Moderately dilated LV with EF 35-40%. Wall motion abnormalities   as noted above. Moderate diastolic dysfunction. Normal RV size   and systolic function. No significant valvular abnormalities. _____________   History of Present Illness     36 y/o male with complex PMH including CAD s/p inf STEMI in 2011 w/ cath @ that time revealing nonobs dzs.  He re-presented in 2015 and was found to have severe LCX, OM1, and RCA dzs.Those areas were successfully treated with DES'.  He has done well over the years and was in his usoh until the night prior to admission, after competing in an adult kickball game, he and his wife went out with some teammates and upon arrival to [employer], he had sudden feeling of sscp/tightness with malaise, and a sense of feeling very hot.  He noted that his HR was ~ 120 bpm.  He went back outside and sat in his car with the A/C on, and after about 10 mins, his chest tightness resolved and he no  longer felt hot.  He went back into the restaurant and continued to feel fatigued and w/ malaise.  He later went home and went to bed.  He had no recurrent c/p overnight but upon awakening this am, he cont to feel malaise and fatigue.  This was similar to the way he felt after his last MI, and so he presented to [facility]  where ECG was non-acute, however troponin was elevated @ 0.50.  He was tx to [facility] for further eval.  He was chest pain free at the time of arrival.  Hospital Course     He was admitted with enzymes cycled, and peaked 0.70. Underwent LHC with Dr. Excell Seltzer showing two vessel disease with patent stents in the RCA, and OM of the Lcx with normal LVEDP. Unable to identify the culprit for Troponin elevation. He was restarted on plavix post cath. Follow up echo showed EF of 35-40% with basal anterolateral hypokinesis, and mid to apical akinesis, and G2DD. His medications were adjusted to add spironolactone. Post cath labs showed Cr 0.75 and Hgb 15.0. He felt well post cath. Plan to reassess echo in 6 weeks for LV function. Encouraged to weigh daily, and given education of heart healthy diet.   He was seen by Dr. Herbie Fernandez and determined stable for discharge home. Follow up in the office has been arranged. Medications are listed below.  _____________  Discharge Vitals Blood pressure 117/75, pulse 72, temperature 98.4 F (36.9 C), temperature source Oral, resp. rate 18, height 6\' 2"  (1.88 m), weight 259 lb 11.2 oz (117.8 kg), SpO2 100 %.  Filed Weights   07/31/16 1716 08/01/16 0500 08/02/16 0630  Weight: 258 lb 6.4 oz (117.2 kg) 257 lb 8 oz (116.8 kg) 259 lb 11.2 oz (117.8 kg)   General appearance: alert, cooperative, appears stated age, no distress. ~mildly obese HEENT: [name]/AT, EOMI, MMM, anicteric sclera Neck: no adenopathy, no carotid bruit and no JVD Lungs: clear to auscultation bilaterally,  Heart: regular rate and rhythm, S1 &S2 normal, no murmur, click, rub or gallop;  Abdomen: soft, non-tender; bowel sounds normal; no masses,  no organomegaly;  Extremities: extremities normal, atraumatic, no cyanosis, and edema  Pulses: 2+ and symmetric; radial cath site c/d/i  Neurologic: Mental status: Alert & oriented x 3, thought content appropriate; non-focal exam.  Pleasant mood & affect.   Labs &  Radiologic Studies    CBC  Recent Labs  07/31/16 1218 08/01/16 0757  WBC 10.3 8.8  NEUTROABS 7.0  --   HGB 16.1 15.0  HCT 46.4 44.8  MCV 89.6 90.3  PLT 222 215   Basic Metabolic Panel  Recent Labs  08/01/16 0757 08/02/16 0334  NA 136 138  K 3.9 3.7  CL 103 104  CO2 25 26  GLUCOSE 115* 102*  BUN 13 13  CREATININE 0.77 0.75  CALCIUM 8.7* 8.8*   Liver Function Tests No results for input(s): AST, ALT, ALKPHOS, BILITOT, PROT, ALBUMIN in the last 72 hours. No results for input(s): LIPASE, AMYLASE in the last 72 hours. Cardiac Enzymes  Recent Labs  07/31/16 1937 08/01/16 0114 08/01/16 0757  TROPONINI 0.65* 0.70* 0.59*   BNP Invalid input(s): POCBNP D-Dimer No results for input(s): DDIMER in the last 72 hours. Hemoglobin A1C No results for input(s): HGBA1C in the last 72 hours. Fasting Lipid Panel  Recent Labs  08/01/16 0114  CHOL 140  HDL 25*  LDLCALC 86  TRIG 409  CHOLHDL 5.6   Thyroid Function Tests  Recent Labs  07/31/16 1937  TSH 1.199   _____________  Dg Chest 2 View  Result Date: 07/31/2016 CLINICAL DATA:  Chest tightness.  Shortness of breath. EXAM: CHEST  2 VIEW COMPARISON:  11/20/2013. FINDINGS: Mediastinum hilar structures normal. Cardiomegaly with normal pulmonary vascularity. No significant pulmonary edema noted on today's exam. No pleural effusion or pneumothorax. IMPRESSION: Stable cardiomegaly.  No evidence of pulmonary edema. Electronically Signed   By: Maisie Fus  Register   On: 07/31/2016 12:32   Disposition   Pt is being discharged home today in good condition.  Follow-up Plans & Appointments    Follow-up Information    Azalee Course, [place] Follow up on 08/15/2016.   Specialties:  Cardiology, Radiology Why:  at 8:30am for your follow up appt Contact information: [address] [place] [address] [phone]          Discharge Instructions    (HEART FAILURE PATIENTS) Call MD:  Anytime you have any of the  following symptoms: 1) 3 pound weight gain in 24 hours or 5 pounds in 1 week 2) shortness of breath, with or without a dry hacking cough 3) swelling in the hands, feet or stomach 4) if you have to sleep on extra pillows at night in order to breathe.    Complete by:  As directed    Amb Referral to Cardiac Rehabilitation    Complete by:  As directed    Diagnosis:  NSTEMI   Call MD for:  redness, tenderness, or signs of infection (pain, swelling, redness, odor or green/yellow discharge around incision site)    Complete by:  As directed    Diet - low sodium heart healthy    Complete by:  As directed    Discharge instructions    Complete by:  As directed    Radial Site Care Refer to this sheet in the next few weeks. These instructions provide you with information on caring for yourself after your procedure. Your caregiver may also give you more specific instructions. Your treatment has been planned according to current medical practices, but problems sometimes occur. Call your caregiver if you have any problems or questions after your procedure. HOME CARE INSTRUCTIONS You may shower the day after the procedure.Remove the bandage (dressing) and gently wash the site with plain soap and water.Gently pat the site dry.  Do not apply powder or lotion to the site.  Do not submerge the affected site in water for 3 to 5 days.  Inspect the site at least twice daily.  Do not flex or bend the affected arm for 24 hours.  No lifting over 5 pounds (2.3 kg) for 5 days after your procedure.  Do not drive home if you are discharged the same day of the procedure. Have someone else drive you.  You may drive 24 hours after the procedure unless otherwise instructed by your caregiver.  What to expect: Any bruising will usually fade within 1 to 2 weeks.  Blood that collects in the tissue (hematoma) may be painful to the touch. It should usually decrease in size and tenderness within 1 to 2 weeks.  SEEK IMMEDIATE  MEDICAL CARE IF: You have unusual pain at the radial site.  You have redness, warmth, swelling, or pain at the radial site.  You have drainage (other than a small amount of blood on the dressing).  You have chills.  You have a fever or persistent symptoms for more than 72 hours.  You have a fever and your  symptoms suddenly get worse.  Your arm becomes pale, cool, tingly, or numb.  You have heavy bleeding from the site. Hold pressure on the site.   Increase activity slowly    Complete by:  As directed       Discharge Medications   Current Discharge Medication List    START taking these medications   Details  clopidogrel (PLAVIX) 75 MG tablet Take 1 tablet (75 mg total) by mouth daily. Qty: 30 tablet, Refills: 6    spironolactone (ALDACTONE) 25 MG tablet Take 1 tablet (25 mg total) by mouth daily. Qty: 30 tablet, Refills: 3      CONTINUE these medications which have NOT CHANGED   Details  acetaminophen (TYLENOL) 500 MG tablet Take 500-1,000 mg by mouth every 6 (six) hours as needed for moderate pain or headache.    albuterol (PROVENTIL HFA;VENTOLIN HFA) 108 (90 BASE) MCG/ACT inhaler Inhale 2 puffs into the lungs every 6 (six) hours as needed for wheezing or shortness of breath.    aspirin 81 MG tablet Take 81 mg by mouth daily.     atorvastatin (LIPITOR) 80 MG tablet TAKE 1 TABLET BY MOUTH ONCE DAILY AT 6 PM. Qty: 30 tablet, Refills: 11    carvedilol (COREG) 12.5 MG tablet Take 1 tablet (12.5 mg total) by mouth 2 (two) times daily with a meal. Qty: 60 tablet, Refills: 11    ezetimibe (ZETIA) 10 MG tablet TAKE 1 TABLET BY MOUTH DAILY. Qty: 90 tablet, Refills: 3    hydrochlorothiazide (HYDRODIURIL) 25 MG tablet Take 1 tablet (25 mg total) by mouth daily. Qty: 90 tablet, Refills: 3    lisinopril (PRINIVIL,ZESTRIL) 20 MG tablet Take 1 tablet (20 mg total) by mouth daily. Qty: 30 tablet, Refills: 11    nitroGLYCERIN (NITROSTAT) 0.4 MG SL tablet Place 1 tablet (0.4 mg total)  under the tongue every 5 (five) minutes as needed for chest pain. Qty: 25 tablet, Refills: 3         Outstanding Labs/Studies   Echo 6 weeks.   Duration of Discharge Encounter   Greater than 30 minutes including physician time.  Signed, Laverda Page NP-C 08/02/2016, 1:20 PM   I saw evaluated the patient. He was stable for discharge. No further chest pain symptoms. No heart failure symptoms. We discussed his reduced ejection fraction and I suspect that he may have had an aborted MI. We therefore have added Plavix for secondary prevention. We will also add spironolactone for his reduced EF.  f/u as noted.  Bryan Lemma, MD Bryan Lemma, M.D., M.S. Interventional Cardiologist   Pager # [phone] Phone # [phone] [address] [place], [place] [address]

## 2016-08-01 NOTE — Progress Notes (Signed)
CARDIAC REHAB PHASE I   PRE:  Rate/Rhythm: 87 SR    BP: sitting 158/98  I came to ambulate pt and do some education however he learned about his EF and no d/c, therefore no longer in the mood to walk. Encouraged him to walk later this pm. Will f/u tomorrow. [id number]  Harriet Masson CES, ACSM 08/01/2016 3:19 PM

## 2016-08-01 NOTE — Progress Notes (Signed)
  Echocardiogram 2D Echocardiogram has been performed.  Arvil Chaco 08/01/2016, 12:19 PM

## 2016-08-01 NOTE — Progress Notes (Signed)
Progress Note  Patient Name: Reginald Fernandez Date of Encounter: 08/01/2016  Primary Cardiologist: Dr. Herbie Baltimore  Subjective   Patient seen and examined after cardiac catheterization.   Doing well, no chest pain, or SOB.  Relieved that no further stents were needed.   Inpatient Medications    Scheduled Meds: . aspirin EC  81 mg Oral Daily  . atorvastatin  80 mg Oral Daily  . carvedilol  12.5 mg Oral BID WC  . clopidogrel  75 mg Oral Daily  . ezetimibe  10 mg Oral Daily  . lisinopril  20 mg Oral Daily  . nitroGLYCERIN  0.5 inch Topical Q6H  . sodium chloride flush  3 mL Intravenous Q12H  . spironolactone  25 mg Oral Daily   Continuous Infusions: . sodium chloride     PRN Meds: sodium chloride, acetaminophen, albuterol, nitroGLYCERIN, ondansetron (ZOFRAN) IV, sodium chloride flush   Vital Signs    Vitals:   08/01/16 1022 08/01/16 1027 08/01/16 1044 08/01/16 1629  BP: 116/75 119/85 123/83   Pulse: 73 76 66 72  Resp: 19 18    Temp:      TempSrc:      SpO2: 98% 100% 98%   Weight:      Height:        Intake/Output Summary (Last 24 hours) at 08/01/16 2223 Last data filed at 08/01/16 1700  Gross per 24 hour  Intake           1154.6 ml  Output                0 ml  Net           1154.6 ml   Filed Weights   07/31/16 1229 07/31/16 1716 08/01/16 0500  Weight: 261 lb 6.4 oz (118.6 kg) 258 lb 6.4 oz (117.2 kg) 257 lb 8 oz (116.8 kg)    ECG    Normal sinus rhythm, HR 70, similar T-wave inversion II, III, aVF  Physical Exam   GEN: No acute distress, lying comfortably in bed.   Neck: No JVD Cardiac: RRR, no murmurs, rubs, or gallops.  Respiratory: Clear to auscultation bilaterally, normal effort. GI: Soft, nontender, non-distended  MS: No edema; No deformity.  Right radial cath site w/o bleeding.  Extremities warm and well perfused. Neuro:  Nonfocal  Psych: Normal affect   Labs    Chemistry  Recent Labs Lab 07/31/16 1218 08/01/16 0757  NA 136 136  K 3.9  3.9  CL 98* 103  CO2 29 25  GLUCOSE 109* 115*  BUN 15 13  CREATININE 0.89 0.77  CALCIUM 9.4 8.7*  GFRNONAA >60 >60  GFRAA >60 >60  ANIONGAP 9 8     Hematology  Recent Labs Lab 07/31/16 1218 08/01/16 0757  WBC 10.3 8.8  RBC 5.18 4.96  HGB 16.1 15.0  HCT 46.4 44.8  MCV 89.6 90.3  MCH 31.1 30.2  MCHC 34.7 33.5  RDW 13.2 13.1  PLT 222 215    Cardiac Enzymes  Recent Labs Lab 07/31/16 1218 07/31/16 1937 08/01/16 0114 08/01/16 0757  TROPONINI 0.50* 0.65* 0.70* 0.59*   No results for input(s): TROPIPOC in the last 168 hours.   BNPNo results for input(s): BNP, PROBNP in the last 168 hours.   DDimer No results for input(s): DDIMER in the last 168 hours.   Radiology    Dg Chest 2 View  Result Date: 07/31/2016 CLINICAL DATA:  Chest tightness.  Shortness of breath. EXAM: CHEST  2 VIEW  COMPARISON:  11/20/2013. FINDINGS: Mediastinum hilar structures normal. Cardiomegaly with normal pulmonary vascularity. No significant pulmonary edema noted on today's exam. No pleural effusion or pneumothorax. IMPRESSION: Stable cardiomegaly.  No evidence of pulmonary edema. Electronically Signed   By: Maisie Fus  Register   On: 07/31/2016 12:32    Cardiac Studies    Cath 08/01/2016: 1. Two-vessel coronary artery disease with continued patency of the stented segments in the right coronary artery and obtuse marginal branch of the circumflex 2. Diffuse coronary irregularity/nonobstructive disease stable from previous angiogram in 2015 3. Moderate segmental LV dysfunction with normal LVEDP  Recommendation: The patient appears to have stable coronary anatomy from his previous study. I cannot identify a culprit lesion for non-ST elevation infarction. Would consider addition of Plavix 75 mg daily for medical treatment of acute coronary syndrome. Otherwise he should continue on his current medical regimen. Will check an echo to better assess LV function.   Echocardiogram 08/01/16: Moderately  dilated left ventricle with basal anterolateral hypokinesis and basal to mid inferolateral as well as mid-apical inferior akinesis. EF suggested 35-40% with GRD 2 DD. (Pseudo-normal). Otherwise essentially normal with no valvular lesions and no suggestion of elevated pulmonary pressures.   Patient Profile     36 y.o. male with hx of CAD s/p prior LCx/OM and RCA stenting in Sept 2015, HTN, HLD, and obesity presents with chest pain and mild elevation in troponin.  Assessment & Plan  Principal Problem:   ACS (acute coronary syndrome) (HCC) Active Problems:   CAD S/P percutaneous coronary angioplasty: DES PCI -Cx-OM1, RCA x 2   Dilated cardiomyopathy (HCC)   Acute combined systolic and diastolic heart failure (HCC)   Essential hypertension   NSTEMI (non-ST elevated myocardial infarction) (HCC)    # ACS/NSTE - MI - chest pain resolution with troponin elevation peak at 0.70.  Has of CAD s/p DES in 2015 to the LCx/OM and RCA on medical therapy with BB, Ace, statin and ASA. Cardiac cath today with continued patency of previously stented vessels and non-obstructive disease stable from previous in 2015.  No further PCI done today. - d/c heparin, restart if chest pain recurs - add Plavix - continue ASA - continue lisinopril 20mg  daily - continue Coreg 12.5mg  BID  # Combined Diastolic and Systolic CHF - Cardiomyopathy - repeat Echo this admission with reduced EF of 35-40% and wall motion abnormalities in area of old infarct.  Grade 2 diastolic dysfunction also noted.  - will add spironolactone 25mg  daily - BMET in morning - consider repeat Echo in 3-4 months outpatient.  # HTN - BP's stable today - continue meds as above  # HLD - on Zetia 10mg  daily and atorvastatin 80mg  daily - fasting lipid panel this AM shows TC 140, HDL 25, and LDL 86 - discussion briefly had with patient regarding consideration of PSCK9 inhibitor as an outpatient.  Signed, Gwynn Burly, DO  08/01/2016, 10:54 AM      I have seen, examined and evaluated the patient this PM along with Dr. Earlene Plater (after cardiac cath - films personally reviewed..  After reviewing all the available data and chart, we discussed the patients laboratory, study & physical findings as well as symptoms in detail. I agree with his findings, examination as well as impression recommendations as per our discussion.   Attending adjustments noted in italics.   Mr. Ozer has not had any further chest pain or dyspnea. Having reviewed the Images with Dr. Excell Seltzer and Dr. Earlene Plater, I cannot fully understand where his  positive troponin levels came from. It is possible that he had a brief episode of plaque rupture related ACS and this is what is seen in the inferior hypokinesis.  Will we cannot exclude is possible cardiomyopathy causing this. The newly identified reduction in EF along with Regional wall motion abnormality (confirming LV Gram findings) would argue more of an ischemic coronary event, despite no obvious culprit lesion or vessel. (APP With that in mind I do agree with starting him on Plavix for ACS.  We will now need to adjust his prior regimen to account for his reduced ejection fraction. We will add spironolactone to his existing beta blocker and ACE inhibitor. -- The hope would be that rechecking his echo in 3 months would show improved EF and wall motion which would correlate with spontaneous revascularization of whatever lesion was the culprit.  In the absence of an obvious lesion, I don't think we need to treat with IV heparin unless pain recurs.   We talked briefly about his lipids as noted above. My recommendation would be for him to be seen by our clinical pharmacists upon his first post hospital visit either with myself or one of the Advanced Practice Providers (APPs) --> this would be to determine if there are any other options since he is already on statin plus Zetia. I think PCSK9 inhibitors probably a good option if  covered.   With + Troponin, I plan to monitor for at least one more day with this new information and starting the medication. If he remains stable we can discharge tomorrow.    Bryan Lemma, M.D., M.S. Interventional Cardiologist   Pager # [phone] Phone # [phone] [address] [place], [place] [address]

## 2016-08-01 NOTE — Progress Notes (Signed)
ANTICOAGULATION CONSULT NOTE - Follow-up Consult  Pharmacy Consult for Heparin Indication: chest pain/ACS  Allergies  Allergen Reactions  . Penicillins Other (See Comments)    unknown    Patient Measurements: Height: 6\' 2"  (188 cm) Weight: 258 lb 6.4 oz (117.2 kg) IBW/kg (Calculated) : 82.2 Heparin Dosing Weight: 109  Vital Signs: Temp: 98.4 F (36.9 C) (05/15 2028) Temp Source: Oral (05/15 2028) BP: 125/89 (05/15 2028) Pulse Rate: 75 (05/15 2028)  Labs:  Recent Labs  07/31/16 1218 07/31/16 1937 08/01/16 0114  HGB 16.1  --   --   HCT 46.4  --   --   PLT 222  --   --   HEPARINUNFRC  --   --  0.35  CREATININE 0.89  --   --   TROPONINI 0.50* 0.65* 0.70*    Estimated Creatinine Clearance: 156.1 mL/min (by C-G formula based on SCr of 0.89 mg/dL).  Assessment: 36 year old male to begin heparin for ACS. Heparin level therapeutic (0.35) on gtt at 1600 units/hr. No bleeding noted. Cath planned for today  Goal of Therapy:  Heparin level 0.3-0.7 units/ml Monitor platelets by anticoagulation protocol: Yes   Plan:  Continue heparin drip at 1600 units / hr Will f/u 6hr confirmatory heparin level  Christoper Fabian, PharmD, BCPS Clinical pharmacist, pager [phone] 08/01/2016,2:44 AM

## 2016-08-02 DIAGNOSIS — E669 Obesity, unspecified: Secondary | ICD-10-CM

## 2016-08-02 LAB — BASIC METABOLIC PANEL
ANION GAP: 8 (ref 5–15)
BUN: 13 mg/dL (ref 6–20)
CO2: 26 mmol/L (ref 22–32)
Calcium: 8.8 mg/dL — ABNORMAL LOW (ref 8.9–10.3)
Chloride: 104 mmol/L (ref 101–111)
Creatinine, Ser: 0.75 mg/dL (ref 0.61–1.24)
Glucose, Bld: 102 mg/dL — ABNORMAL HIGH (ref 65–99)
POTASSIUM: 3.7 mmol/L (ref 3.5–5.1)
SODIUM: 138 mmol/L (ref 135–145)

## 2016-08-02 MED ORDER — CLOPIDOGREL BISULFATE 75 MG PO TABS: 75.0000 mg | ORAL_TABLET | Freq: Every day | ORAL | 6 refills | Status: DC

## 2016-08-02 MED ORDER — SPIRONOLACTONE 25 MG PO TABS: 25.0000 mg | ORAL_TABLET | Freq: Every day | ORAL | 3 refills | Status: DC

## 2016-08-02 MED FILL — SPIRONOLACTONE 25 MG TABLET: 25 | 30 days supply | Qty: 30 | Fill #0

## 2016-08-02 MED FILL — CLOPIDOGREL 75 MG TABLET: 75 | 30 days supply | Qty: 30 | Fill #0

## 2016-08-02 NOTE — Progress Notes (Signed)
CARDIAC REHAB PHASE I   PRE:  Rate/Rhythm: 74 SR    BP: sitting 149/96    SaO2:   MODE:  Ambulation: 450 ft   POST:  Rate/Rhythm: 90 SR    BP: sitting 144/110     SaO2:   Tolerated walk well except BP elevated. RN sts he got meds about 0830. Good discussion with pt of Plavix, diet, ex, vaping cessation, NTG, and CRPII. Pt feels he really tries to take care of himself and he is the "healthy one" of his peers. Encouraged him to try to quit vaping and monitor his BP. We also discussed h/o pre-DM and watching sugar/carb intake. Will send referral to [facility] CRPII.  [phone]    Harriet Masson CES, ACSM 08/02/2016 9:51 AM

## 2016-08-03 ENCOUNTER — Telehealth (HOSPITAL_COMMUNITY): Payer: Self-pay

## 2016-08-03 NOTE — Telephone Encounter (Signed)
Patient insurance is active and benefits verified. Patient has [employer] - $40.00 co-payment, deductible $2000/$0 has been met, out of pocket $7,150/$124.36 has been met, 0% co-insurance, no pre-authorization and no limit on visit. Passport/reference [phone].

## 2016-08-11 HISTORY — PX: TRANSTHORACIC ECHOCARDIOGRAM: SHX275

## 2016-08-15 ENCOUNTER — Encounter: Payer: Self-pay | Admitting: *Deleted

## 2016-08-15 ENCOUNTER — Ambulatory Visit: Payer: 59 | Admitting: Physician Assistant

## 2016-08-15 NOTE — Progress Notes (Deleted)
Cardiology Office Note    Date:  08/15/2016   ID:  Reginald Fernandez, DOB [birth year], MRN [id number]  PCP:  Patient, No Pcp Per  Cardiologist:  Dr. Herbie Baltimore  No chief complaint on file.   History of Present Illness:  Reginald Fernandez is a 36 y.o. male with PMH of HTN, Morbid obesity, hyperlipidemia, and history of CAD. He initially underwent a cardiac catheterization in July 2011, however cardiac catheterization at the time showed no obstructive CAD. He had inferolateral STEMI in July 2015 and underwent PCI of RCA. He was recently seen by Dr. Herbie Baltimore in the office in April, and was doing well at the time. He presented to the [facility] on 07/31/2016 with chest pain and elevated troponin. He was subsequently transferred to [facility] for further evaluation. Initial troponin was elevated at 0.5, this subsequently trended up to 0.7 before trending back down. Given the elevated troponin, he underwent cardiac catheterization on 08/01/2016 which showed stable coronary artery anatomy compared to the previous study, no culprit lesion was identified. There was two-vessel CAD with continued patency of RCA stent and obtuse marginal branch of left circumflex, diffuse coronary irregularities with nonobstructive disease otherwise compared to 2015. EF 40% with normal LVEDP. Medical therapy was recommended with additional Plavix. Echocardiogram obtained on 08/01/2016 showed EF 35-40%, grade 2 diastolic dysfunction, basal anterolateral hypokinesis with basal to mid inferolateral akinesis, borderline aortic root at 37 mm. his EF is down when compared to the previous echocardiogram in July 2015 which showed EF 55-60%. He was continued on carvedilol and lisinopril, spironolactone was added. His cath film was reviewed with the interventional team, however we were unable to figure out why his troponin was positive, it is possible that he had a brief episode of plaque rupture resulting in the inferior hypokinesis and reduction in  EF. Once heart failure medications optimized, we can repeat echocardiogram in 3 month. Lipid panel obtained during this hospitalization showed cholesterol 140, HDL 25, LDL 86, triglyceride 147. It was recommended to refer him to lipid clinic for consideration of PCSK 9 inhibitor.   No EKG unless recurrent chest pain since leaving hospital Refer to lipid clinic   Past Medical History:  Diagnosis Date  . CAD S/P percutaneous coronary angioplasty 11/20/2013   a. 09/2009: EKG with Inf STEMI - no obstructive CAD;  b. 11/2013 Inflat STEMI/PCI: LM nl, LAD 20p, D1 sm - diff dzs, D2 large - nl, LCX 95-99, OM1 100 (3.5x38 Promus Premier DES), RCA 95-35m, 80d (3.0x20 and 3.0x16 Promus Premier DES');  c. 11/2013 Echo: Ef 55-60%, no rwma.   . Essential hypertension   . History of echocardiogram    a. 11/2016 Echo: EF 55-60%, no rwma.  . Hyperlipidemia with target LDL less than 100   . Marijuana abuse   . Migraine   . Morbid obesity (HCC)   . ST-segment elevation myocardial infarction (STEMI) of inferior wall (HCC) 11/20/2013    Past Surgical History:  Procedure Laterality Date  . CARDIAC CATHETERIZATION  09/2009;    Mild RCA luminal Irregularities - MIld INf HK -- Med Rx; b)  . LEFT HEART CATH AND CORONARY ANGIOGRAPHY N/A 08/01/2016   Procedure: Left Heart Cath and Coronary Angiography;  Surgeon: Tonny Bollman, MD;  Location: [facility] INVASIVE CV LAB;  Service: Cardiovascular;  Laterality: N/A;  . LEFT HEART CATHETERIZATION WITH CORONARY ANGIOGRAM N/A 11/20/2013   Procedure: LEFT HEART CATHETERIZATION WITH CORONARY ANGIOGRAM;  Surgeon: Marykay Lex, MD;  Location: [facility] CATH LAB;  Service: Cardiovascular;   Inf STEMI: OM1 100%, m-dRCA 95-99% thrombotic mRCA with tandem 80% -- PCI, LAD & Cx relatively normal.  . PERCUTANEOUS CORONARY STENT INTERVENTION (PCI-S)  11/20/2013   a) PCI - OM1 Promus Premier DES 3.5 mm x 38 mm, m-d RCA Promus Premier DES  3.0 mm x 20 mm & 3.0 mm x 16 mm  . TRANSTHORACIC ECHOCARDIOGRAM   11/20/2013   LV EF 55-60%. No regional wall motion abnormality.  Normal valves     Current Medications: Outpatient Medications Prior to Visit  Medication Sig Dispense Refill  . acetaminophen (TYLENOL) 500 MG tablet Take 500-1,000 mg by mouth every 6 (six) hours as needed for moderate pain or headache.    . albuterol (PROVENTIL HFA;VENTOLIN HFA) 108 (90 BASE) MCG/ACT inhaler Inhale 2 puffs into the lungs every 6 (six) hours as needed for wheezing or shortness of breath.    Marland Kitchen aspirin 81 MG tablet Take 81 mg by mouth daily.     Marland Kitchen atorvastatin (LIPITOR) 80 MG tablet TAKE 1 TABLET BY MOUTH ONCE DAILY AT 6 PM. (Patient taking differently: TAKE 1 TABLET BY MOUTH ONCE DAILY) 30 tablet 11  . carvedilol (COREG) 12.5 MG tablet Take 1 tablet (12.5 mg total) by mouth 2 (two) times daily with a meal. 60 tablet 11  . clopidogrel (PLAVIX) 75 MG tablet Take 1 tablet (75 mg total) by mouth daily. 30 tablet 6  . ezetimibe (ZETIA) 10 MG tablet TAKE 1 TABLET BY MOUTH DAILY. 90 tablet 3  . hydrochlorothiazide (HYDRODIURIL) 25 MG tablet Take 1 tablet (25 mg total) by mouth daily. 90 tablet 3  . lisinopril (PRINIVIL,ZESTRIL) 20 MG tablet Take 1 tablet (20 mg total) by mouth daily. 30 tablet 11  . nitroGLYCERIN (NITROSTAT) 0.4 MG SL tablet Place 1 tablet (0.4 mg total) under the tongue every 5 (five) minutes as needed for chest pain. 25 tablet 3  . spironolactone (ALDACTONE) 25 MG tablet Take 1 tablet (25 mg total) by mouth daily. 30 tablet 3   No facility-administered medications prior to visit.      Allergies:   Penicillins   Social History   Social History  . Marital status: Married    Spouse name: N/A  . Number of children: 2  . Years of education: N/A   Occupational History  .  [employer] [employer]   Social History Main Topics  . Smoking status: Former Smoker    Quit date: 11/20/2009  . Smokeless tobacco: Never Used  . Alcohol use No  . Drug use: No  . Sexual activity:  Not on file   Other Topics Concern  . Not on file   Social History Narrative   Lives in [place] with wife.  Works with mentally challenged adults.     Family History:  The patient's ***family history includes Heart attack (age of onset: 27) in his father; Heart failure in his father; Hyperlipidemia in his father; Hypertension in his father and mother.   ROS:   Please see the history of present illness.    ROS All other systems reviewed and are negative.   PHYSICAL EXAM:   VS:  There were no vitals taken for this visit.   GEN: Well nourished, well developed, in no acute distress  HEENT: normal  Neck: no JVD, carotid bruits, or masses Cardiac: ***RRR; no murmurs, rubs, or gallops,no edema  Respiratory:  clear to auscultation bilaterally, normal work of breathing GI: soft, nontender, nondistended, + BS  MS: no deformity or atrophy  Skin: warm and dry, no rash Neuro:  Alert and Oriented x 3, Strength and sensation are intact Psych: euthymic mood, full affect  Wt Readings from Last 3 Encounters:  08/02/16 259 lb 11.2 oz (117.8 kg)  07/12/16 275 lb (124.7 kg)  06/02/15 288 lb (130.6 kg)      Studies/Labs Reviewed:   EKG:  EKG is*** ordered today.  The ekg ordered today demonstrates ***  Recent Labs: 07/31/2016: TSH 1.199 08/01/2016: Hemoglobin 15.0; Platelets 215 08/02/2016: BUN 13; Creatinine, Ser 0.75; Potassium 3.7; Sodium 138   Lipid Panel    Component Value Date/Time   CHOL 140 08/01/2016 0114   TRIG 147 08/01/2016 0114   HDL 25 (L) 08/01/2016 0114   CHOLHDL 5.6 08/01/2016 0114   VLDL 29 08/01/2016 0114   LDLCALC 86 08/01/2016 0114    Additional studies/ records that were reviewed today include:   Echo 11/20/2013 LV EF: 55% -  60%  Study Conclusions  - Left ventricle: The cavity size was normal. Systolic function was normal. The estimated ejection fraction was in the range of 55% to 60%. Wall motion was normal; there were no regional wall motion  abnormalities. - Atrial septum: No defect or patent foramen ovale was identified.    Echo 08/01/2016 LV EF: 35% -   40%  Study Conclusions  - Left ventricle: The cavity size was moderately dilated. Wall   thickness was normal. Basal anterolateral hypokinesis. Basal to   mid inferolateral akinesis. Mid to apical inferior akinesis.   Systolic function was moderately reduced. The estimated ejection   fraction was in the range of 35% to 40%. Features are consistent   with a pseudonormal left ventricular filling pattern, with   concomitant abnormal relaxation and increased filling pressure   (grade 2 diastolic dysfunction). - Aortic valve: There was no stenosis. - Aorta: Borderline dilated aortic root. Aortic root dimension: 37   mm (ED). - Mitral valve: There was no significant regurgitation. - Left atrium: The atrium was moderately dilated. - Right ventricle: The cavity size was normal. Systolic function   was normal. - Right atrium: The atrium was mildly dilated. - Pulmonary arteries: No complete TR doppler jet so unable to   estimate PA systolic pressure. - Inferior vena cava: The vessel was normal in size. The   respirophasic diameter changes were in the normal range (>= 50%),   consistent with normal central venous pressure.  Impressions:  - Moderately dilated LV with EF 35-40%. Wall motion abnormalities   as noted above. Moderate diastolic dysfunction. Normal RV size   and systolic function. No significant valvular abnormalities.   Cath 08/01/2016 Conclusion   1. Two-vessel coronary artery disease with continued patency of the stented segments in the right coronary artery and obtuse marginal branch of the circumflex 2. Diffuse coronary irregularity/nonobstructive disease stable from previous angiogram in 2015 3. Moderate segmental LV dysfunction with normal LVEDP  Recommendation: The patient appears to have stable coronary anatomy from his previous study. I cannot  identify a culprit lesion for non-ST elevation infarction. Would consider addition of Plavix 75 mg daily for medical treatment of acute coronary syndrome. Otherwise he should continue on his current medical regimen. Will check an echo to better assess LV function.     ASSESSMENT:    No diagnosis found.   PLAN:  In order of problems listed above:  1. ***    Medication Adjustments/Labs and Tests Ordered: Current medicines are reviewed at length with  the patient today.  Concerns regarding medicines are outlined above.  Medication changes, Labs and Tests ordered today are listed in the Patient Instructions below. There are no Patient Instructions on file for this visit.   Ramond Dial, [place]  08/15/2016 6:29 AM    [facility] [address] [place], [place], [place]  [address] Phone: [phone]; Fax: [phone]

## 2016-08-31 MED FILL — CLOPIDOGREL 75 MG TABLET: 75 | 30 days supply | Qty: 30 | Fill #1

## 2016-08-31 MED FILL — CARVEDILOL 12.5 MG TABLET: 12.5 | 30 days supply | Qty: 60 | Fill #1

## 2016-08-31 MED FILL — HYDROCHLOROTHIAZIDE 25 MG T: 25 | 30 days supply | Qty: 30 | Fill #1

## 2016-08-31 MED FILL — LISINOPRIL 20 MG TABLET: 20 | 30 days supply | Qty: 30 | Fill #1

## 2016-08-31 MED FILL — SPIRONOLACTONE 25 MG TABLET: 25 | 30 days supply | Qty: 30 | Fill #1

## 2016-10-02 MED FILL — CLOPIDOGREL 75 MG TABLET: 75 | 30 days supply | Qty: 30 | Fill #2

## 2016-10-02 MED FILL — HYDROCHLOROTHIAZIDE 25 MG T: 25 | 30 days supply | Qty: 30 | Fill #2

## 2016-10-02 MED FILL — SPIRONOLACTONE 25 MG TABLET: 25 | 30 days supply | Qty: 30 | Fill #2

## 2016-10-02 MED FILL — CARVEDILOL 12.5 MG TABLET: 12.5 | 30 days supply | Qty: 60 | Fill #2

## 2016-10-02 MED FILL — LISINOPRIL 20 MG TAB: 20 | 30 days supply | Qty: 30 | Fill #2

## 2016-10-03 ENCOUNTER — Other Ambulatory Visit: Payer: Self-pay | Admitting: Cardiology

## 2016-10-03 MED FILL — ATORVASTATIN 80 MG TABLET: 80 | 30 days supply | Qty: 30 | Fill #0

## 2016-10-03 NOTE — Telephone Encounter (Signed)
Rx has been sent to the pharmacy electronically. ° °

## 2016-11-06 MED FILL — LISINOPRIL 20 MG TAB: 20 | 30 days supply | Qty: 30 | Fill #3

## 2016-11-06 MED FILL — CARVEDILOL 12.5 MG TABLET: 12.5 | 30 days supply | Qty: 60 | Fill #3

## 2016-11-06 MED FILL — SPIRONOLACTONE 25 MG TABLET: 25 | 30 days supply | Qty: 30 | Fill #3

## 2016-11-06 MED FILL — CLOPIDOGREL 75 MG TABLET: 75 | 30 days supply | Qty: 30 | Fill #3

## 2016-11-06 MED FILL — HYDROCHLOROTHIAZIDE 25 MG T: 25 | 30 days supply | Qty: 30 | Fill #3

## 2016-11-06 MED FILL — ATORVASTATIN 80 MG TABLET: 80 | 30 days supply | Qty: 30 | Fill #1

## 2016-12-10 ENCOUNTER — Other Ambulatory Visit: Payer: Self-pay | Admitting: Cardiology

## 2016-12-10 MED FILL — HYDROCHLOROTHIAZIDE 25 MG T: 25 | 30 days supply | Qty: 30 | Fill #4

## 2016-12-10 MED FILL — ATORVASTATIN 80 MG TABLET: 80 | 30 days supply | Qty: 30 | Fill #2

## 2016-12-10 MED FILL — CARVEDILOL 12.5 MG TABLET: 12.5 | 30 days supply | Qty: 60 | Fill #4

## 2016-12-10 MED FILL — CLOPIDOGREL 75 MG TABLET: 75 | 30 days supply | Qty: 30 | Fill #4

## 2016-12-10 MED FILL — LISINOPRIL 20 MG TABS: 20 | 30 days supply | Qty: 30 | Fill #4

## 2016-12-11 MED FILL — SPIRONOLACTONE 25 MG TABLET: 25 | 31 days supply | Qty: 31 | Fill #0

## 2017-02-06 MED FILL — SPIRONOLACTONE 25 MG TABLET: 25 | 31 days supply | Qty: 31 | Fill #1

## 2017-02-06 MED FILL — CLOPIDOGREL 75 MG TABLET: 75 | 30 days supply | Qty: 30 | Fill #5

## 2017-02-06 MED FILL — ATORVASTATIN 80 MG TABLET: 80 | 30 days supply | Qty: 30 | Fill #3

## 2017-02-06 MED FILL — LISINOPRIL 20 MG TABS: 20 | 30 days supply | Qty: 30 | Fill #5

## 2017-02-06 MED FILL — CARVEDILOL 12.5 MG TABLET: 12.5 | 30 days supply | Qty: 60 | Fill #5

## 2017-02-06 MED FILL — HYDROCHLOROTHIAZIDE 25 MG T: 25 | 30 days supply | Qty: 30 | Fill #5

## 2017-03-25 MED FILL — CLOPIDOGREL 75 MG TABLET: 75 | 30 days supply | Qty: 30 | Fill #6

## 2017-03-25 MED FILL — CARVEDILOL 12.5 MG TABLET: 12.5 | 30 days supply | Qty: 60 | Fill #6

## 2017-03-25 MED FILL — HYDROCHLOROTHIAZIDE 25 MG T: 25 | 30 days supply | Qty: 30 | Fill #6

## 2017-03-25 MED FILL — SPIRONOLACTONE 25 MG TABLET: 25 | 31 days supply | Qty: 31 | Fill #2

## 2017-03-25 MED FILL — LISINOPRIL 20 MG TABLET: 20 | 30 days supply | Qty: 30 | Fill #6

## 2017-03-25 MED FILL — ATORVASTATIN 80 MG TABLET: 80 | 30 days supply | Qty: 30 | Fill #4

## 2017-05-01 ENCOUNTER — Other Ambulatory Visit: Payer: Self-pay | Admitting: Cardiology

## 2017-05-16 MED FILL — ATORVASTATIN 80 MG TABLET: 80 | 30 days supply | Qty: 30 | Fill #5

## 2017-05-16 MED FILL — LISINOPRIL 20 MG TABLET: 20 | 30 days supply | Qty: 30 | Fill #7

## 2017-05-16 MED FILL — SPIRONOLACTONE 25 MG TABLET: 25 | 31 days supply | Qty: 31 | Fill #3

## 2017-05-16 MED FILL — CARVEDILOL 12.5 MG TABLET: 12.5 | 30 days supply | Qty: 60 | Fill #7

## 2017-05-16 MED FILL — CLOPIDOGREL 75 MG TABLET: 75 | 30 days supply | Qty: 30 | Fill #0

## 2017-05-16 MED FILL — HYDROCHLOROTHIAZIDE 25 MG T: 25 | 30 days supply | Qty: 30 | Fill #7

## 2017-06-01 ENCOUNTER — Encounter (HOSPITAL_BASED_OUTPATIENT_CLINIC_OR_DEPARTMENT_OTHER): Payer: Self-pay | Admitting: *Deleted

## 2017-06-01 ENCOUNTER — Emergency Department (HOSPITAL_BASED_OUTPATIENT_CLINIC_OR_DEPARTMENT_OTHER)
Admission: EM | Admit: 2017-06-01 | Discharge: 2017-06-02 | Disposition: A | Discharge status: Home / Self Care | Payer: 59 | Attending: Emergency Medicine | Admitting: Emergency Medicine

## 2017-06-01 ENCOUNTER — Other Ambulatory Visit: Payer: Self-pay

## 2017-06-01 DIAGNOSIS — Z7982 Long term (current) use of aspirin: Secondary | ICD-10-CM | POA: Insufficient documentation

## 2017-06-01 DIAGNOSIS — Z87891 Personal history of nicotine dependence: Secondary | ICD-10-CM | POA: Diagnosis not present

## 2017-06-01 DIAGNOSIS — I10 Essential (primary) hypertension: Secondary | ICD-10-CM | POA: Insufficient documentation

## 2017-06-01 DIAGNOSIS — G43009 Migraine without aura, not intractable, without status migrainosus: Secondary | ICD-10-CM | POA: Insufficient documentation

## 2017-06-01 DIAGNOSIS — Z79899 Other long term (current) drug therapy: Secondary | ICD-10-CM | POA: Insufficient documentation

## 2017-06-01 DIAGNOSIS — I259 Chronic ischemic heart disease, unspecified: Secondary | ICD-10-CM | POA: Insufficient documentation

## 2017-06-01 DIAGNOSIS — R51 Headache: Secondary | ICD-10-CM | POA: Diagnosis present

## 2017-06-01 DIAGNOSIS — Z955 Presence of coronary angioplasty implant and graft: Secondary | ICD-10-CM | POA: Insufficient documentation

## 2017-06-01 DIAGNOSIS — Z7902 Long term (current) use of antithrombotics/antiplatelets: Secondary | ICD-10-CM | POA: Diagnosis not present

## 2017-06-01 NOTE — ED Triage Notes (Signed)
Pt reports headache x 1 day. Hx of mha as a teenager. Denies nausea. +photosensitivity

## 2017-06-02 MED ORDER — DIPHENHYDRAMINE HCL 50 MG/ML IJ SOLN
25.0000 mg | Freq: Once | INTRAMUSCULAR | Status: AC
  Administered 2017-06-02: 25 mg via INTRAVENOUS
  Filled 2017-06-02: qty 1

## 2017-06-02 MED ORDER — PROMETHAZINE HCL 25 MG PO TABS: 25.0000 mg | ORAL_TABLET | Freq: Four times a day (QID) | ORAL | 0 refills | Status: DC | PRN

## 2017-06-02 MED ORDER — KETOROLAC TROMETHAMINE 30 MG/ML IJ SOLN
30.0000 mg | Freq: Once | INTRAMUSCULAR | Status: AC
  Administered 2017-06-02: 30 mg via INTRAVENOUS
  Filled 2017-06-02: qty 1

## 2017-06-02 MED ORDER — SODIUM CHLORIDE 0.9 % IV BOLUS (SEPSIS)
1000.0000 mL | Freq: Once | INTRAVENOUS | Status: AC
  Administered 2017-06-02: 1000 mL via INTRAVENOUS

## 2017-06-02 MED ORDER — METOCLOPRAMIDE HCL 5 MG/ML IJ SOLN
10.0000 mg | Freq: Once | INTRAMUSCULAR | Status: AC
  Administered 2017-06-02: 10 mg via INTRAVENOUS
  Filled 2017-06-02: qty 2

## 2017-06-02 NOTE — ED Notes (Signed)
Patient states had an argument with one of his children and began to experience a headache that increased in intensity.  Acetaminophen given at home with little effect.

## 2017-06-02 NOTE — ED Notes (Signed)
Pt and FM given d/c instructions as per chart. Rx x 1 with precautions. Verbalizes understanding. No questions.

## 2017-06-02 NOTE — ED Provider Notes (Signed)
TIME SEEN: 12:02 AM  CHIEF COMPLAINT: Headache  HPI: Patient is a 37 year old male with history of hypertension, hyperlipidemia, CAD status post stent who presents to the emergency department with gradual onset right-sided headache that started yesterday afternoon.  Pain is throbbing in nature without radiation.  He has photosensitivity, nausea or vomiting.  No fever, neck pain or neck stiffness.  No head injury.  No numbness, tingling or focal weakness.  Has had history of migraines as a teenager and states this feels similar.  Tried aspirin and Tylenol without much relief.  ROS: See HPI Constitutional: no fever  Eyes: no drainage  ENT: no runny nose   Cardiovascular:  no chest pain  Resp: no SOB  GI: no vomiting GU: no dysuria Integumentary: no rash  Allergy: no hives  Musculoskeletal: no leg swelling  Neurological: no slurred speech ROS otherwise negative  PAST MEDICAL HISTORY/PAST SURGICAL HISTORY:  Past Medical History:  Diagnosis Date  . CAD S/P percutaneous coronary angioplasty 11/20/2013   a. 09/2009: EKG with Inf STEMI - no obstructive CAD;  b. 11/2013 Inflat STEMI/PCI: LM nl, LAD 20p, D1 sm - diff dzs, D2 large - nl, LCX 95-99, OM1 100 (3.5x38 Promus Premier DES), RCA 95-52m, 80d (3.0x20 and 3.0x16 Promus Premier DES');  c. 11/2013 Echo: Ef 55-60%, no rwma.   . Essential hypertension   . History of echocardiogram    a. 11/2016 Echo: EF 55-60%, no rwma.  . Hyperlipidemia with target LDL less than 100   . Marijuana abuse   . Migraine   . Morbid obesity (HCC)   . ST-segment elevation myocardial infarction (STEMI) of inferior wall (HCC) 11/20/2013    MEDICATIONS:  Prior to Admission medications   Medication Sig Start Date End Date Taking? Authorizing Provider  albuterol (PROVENTIL HFA;VENTOLIN HFA) 108 (90 BASE) MCG/ACT inhaler Inhale 2 puffs into the lungs every 6 (six) hours as needed for wheezing or shortness of breath.   Yes [provider]  aspirin 81 MG tablet  Take 81 mg by mouth daily.    Yes [provider]  atorvastatin (LIPITOR) 80 MG tablet TAKE 1 TABLET BY MOUTH ONCE DAILY AT 6 PM. 10/03/16  Yes Marykay Lex, MD  carvedilol (COREG) 12.5 MG tablet Take 1 tablet (12.5 mg total) by mouth 2 (two) times daily with a meal. 07/20/16  Yes Marykay Lex, MD  hydrochlorothiazide (HYDRODIURIL) 25 MG tablet Take 1 tablet (25 mg total) by mouth daily. 07/12/16  Yes Marykay Lex, MD  lisinopril (PRINIVIL,ZESTRIL) 20 MG tablet Take 1 tablet (20 mg total) by mouth daily. 07/20/16  Yes Marykay Lex, MD  acetaminophen (TYLENOL) 500 MG tablet Take 500-1,000 mg by mouth every 6 (six) hours as needed for moderate pain or headache.    [provider]  clopidogrel (PLAVIX) 75 MG tablet TAKE 1 TABLET BY MOUTH DAILY 05/02/17   Marykay Lex, MD  ezetimibe (ZETIA) 10 MG tablet TAKE 1 TABLET BY MOUTH DAILY. 07/20/16   Marykay Lex, MD  nitroGLYCERIN (NITROSTAT) 0.4 MG SL tablet Place 1 tablet (0.4 mg total) under the tongue every 5 (five) minutes as needed for chest pain. 11/23/13   Creig Hines, NP  spironolactone (ALDACTONE) 25 MG tablet Take 1 tablet (25 mg total) by mouth daily. 12/11/16   Marykay Lex, MD    ALLERGIES:  Allergies  Allergen Reactions  . Penicillins Other (See Comments)    unknown    SOCIAL HISTORY:  Social History  Tobacco Use  . Smoking status: Current Some Day Smoker    Last attempt to quit: 11/20/2009    Years since quitting: 7.5  . Smokeless tobacco: Never Used  Substance Use Topics  . Alcohol use: No    Alcohol/week: 0.0 oz    FAMILY HISTORY: Family History  Problem Relation Age of Onset  . Heart attack Father 85       2 MIs by age 59 -- CABG  . Hypertension Father   . Heart failure Father   . Hyperlipidemia Father   . Hypertension Mother     EXAM: BP (!) 139/104 (BP Location: Left Arm)   Pulse 77   Temp 99.4 F (37.4 C) (Oral)   Resp 18   Ht 6\' 2"  (1.88 m) Comment:  Simultaneous filing. User may not have seen previous data.  Wt 99.8 kg (220 lb) Comment: Simultaneous filing. User may not have seen previous data.  SpO2 100%   BMI 28.25 kg/m  CONSTITUTIONAL: Alert and oriented and responds appropriately to questions. Well-appearing; well-nourished HEAD: Normocephalic EYES: Conjunctivae clear, pupils appear equal, EOMI; + photophobia ENT: normal nose; moist mucous membranes NECK: Supple, no meningismus, no nuchal rigidity, no LAD  CARD: RRR; S1 and S2 appreciated; no murmurs, no clicks, no rubs, no gallops RESP: Normal chest excursion without splinting or tachypnea; breath sounds clear and equal bilaterally; no wheezes, no rhonchi, no rales, no hypoxia or respiratory distress, speaking full sentences ABD/GI: Normal bowel sounds; non-distended; soft, non-tender, no rebound, no guarding, no peritoneal signs, no hepatosplenomegaly BACK:  The back appears normal and is non-tender to palpation, there is no CVA tenderness EXT: Normal ROM in all joints; non-tender to palpation; no edema; normal capillary refill; no cyanosis, no calf tenderness or swelling    SKIN: Normal color for age and race; warm; no rash NEURO: Moves all extremities equally, normal sensation diffusely, normal gait, cranial nerves II through XII intact, normal speech PSYCH: The patient's mood and manner are appropriate. Grooming and personal hygiene are appropriate.  MEDICAL DECISION MAKING: Patient here with migraine headache.  Feels similar to his previous migraines.  No sudden onset, thunderclap headache.  No head injury.  No focal neurologic deficits.  He is on Plavix but at this time I do not feel he needs emergent head imaging.  We will treat symptomatically with Toradol, Reglan, Benadryl, IV fluids.  Doubt intracranial hemorrhage, meningitis, stroke.  ED PROGRESS: Patient's headache completely resolved after migraine cocktail.  He is feeling much better and ready for discharge home.  His  blood pressure has also improved.  Recommended alternating Tylenol and Motrin at home as needed for pain and will discharge with prescription of Phenergan and outpatient neurology follow-up.  Discussed return precautions.   At this time, I do not feel there is any life-threatening condition present. I have reviewed and discussed all results (EKG, imaging, lab, urine as appropriate) and exam findings with patient/family. I have reviewed nursing notes and appropriate previous records.  I feel the patient is safe to be discharged home without further emergent workup and can continue workup as an outpatient as needed. Discussed usual and customary return precautions. Patient/family verbalize understanding and are comfortable with this plan.  Outpatient follow-up has been provided if needed. All questions have been answered.      Derrisha Foos, Layla Maw, DO 06/02/17 [phone]

## 2017-06-02 NOTE — ED Notes (Signed)
ED Provider at bedside. 

## 2017-06-02 NOTE — Discharge Instructions (Signed)
You may alternate Tylenol 1000 mg every 6 hours as needed for pain and Ibuprofen 800 mg every 8 hours as needed for pain.  Please take Ibuprofen with food. ° °

## 2017-06-26 MED FILL — ATORVASTATIN 80 MG TABLET: 80 | 30 days supply | Qty: 30 | Fill #6

## 2017-06-26 MED FILL — CLOPIDOGREL 75 MG TABLET: 75 | 30 days supply | Qty: 30 | Fill #1

## 2017-06-26 MED FILL — SPIRONOLACTONE 25 MG TABLET: 25 | 31 days supply | Qty: 31 | Fill #4

## 2017-06-26 MED FILL — CARVEDILOL 12.5 MG TABLET: 12.5 | 30 days supply | Qty: 60 | Fill #8

## 2017-06-26 MED FILL — LISINOPRIL 20 MG TABLET: 20 | 30 days supply | Qty: 30 | Fill #8

## 2017-06-26 MED FILL — HYDROCHLOROTHIAZIDE 25 MG T: 25 | 30 days supply | Qty: 30 | Fill #8

## 2017-09-20 ENCOUNTER — Other Ambulatory Visit: Payer: Self-pay | Admitting: Cardiology

## 2017-10-03 MED FILL — HYDROCHLOROTHIAZIDE 25 MG T: 25 | 30 days supply | Qty: 30 | Fill #0

## 2017-10-03 MED FILL — LISINOPRIL 20 MG TABLET: 20 | 30 days supply | Qty: 30 | Fill #0

## 2017-10-07 ENCOUNTER — Other Ambulatory Visit: Payer: Self-pay | Admitting: Cardiology

## 2018-03-27 ENCOUNTER — Ambulatory Visit: Payer: Self-pay | Admitting: Cardiology

## 2018-03-31 ENCOUNTER — Encounter: Payer: Self-pay | Admitting: Cardiology

## 2018-10-23 ENCOUNTER — Other Ambulatory Visit: Payer: Self-pay

## 2018-10-23 ENCOUNTER — Ambulatory Visit (HOSPITAL_COMMUNITY): Admit: 2018-10-23 | Payer: Self-pay | Admitting: Internal Medicine

## 2018-10-23 ENCOUNTER — Emergency Department (HOSPITAL_COMMUNITY): Payer: BC Managed Care – PPO

## 2018-10-23 ENCOUNTER — Inpatient Hospital Stay (HOSPITAL_COMMUNITY)
Admission: EM | Admit: 2018-10-23 | Discharge: 2018-10-25 | DRG: 281 | Disposition: A | Discharge status: Home / Self Care | Payer: BC Managed Care – PPO | Attending: Cardiovascular Disease | Admitting: Cardiovascular Disease

## 2018-10-23 ENCOUNTER — Encounter (HOSPITAL_COMMUNITY): Payer: Self-pay | Admitting: *Deleted

## 2018-10-23 DIAGNOSIS — R079 Chest pain, unspecified: Secondary | ICD-10-CM | POA: Diagnosis not present

## 2018-10-23 DIAGNOSIS — Z79899 Other long term (current) drug therapy: Secondary | ICD-10-CM | POA: Diagnosis not present

## 2018-10-23 DIAGNOSIS — I445 Left posterior fascicular block: Secondary | ICD-10-CM | POA: Diagnosis not present

## 2018-10-23 DIAGNOSIS — Z9114 Patient's other noncompliance with medication regimen: Secondary | ICD-10-CM | POA: Diagnosis not present

## 2018-10-23 DIAGNOSIS — Z955 Presence of coronary angioplasty implant and graft: Secondary | ICD-10-CM | POA: Diagnosis not present

## 2018-10-23 DIAGNOSIS — F1290 Cannabis use, unspecified, uncomplicated: Secondary | ICD-10-CM | POA: Diagnosis present

## 2018-10-23 DIAGNOSIS — R7303 Prediabetes: Secondary | ICD-10-CM | POA: Diagnosis present

## 2018-10-23 DIAGNOSIS — Z7982 Long term (current) use of aspirin: Secondary | ICD-10-CM | POA: Diagnosis not present

## 2018-10-23 DIAGNOSIS — Z8249 Family history of ischemic heart disease and other diseases of the circulatory system: Secondary | ICD-10-CM

## 2018-10-23 DIAGNOSIS — I214 Non-ST elevation (NSTEMI) myocardial infarction: Principal | ICD-10-CM | POA: Diagnosis present

## 2018-10-23 DIAGNOSIS — E669 Obesity, unspecified: Secondary | ICD-10-CM | POA: Diagnosis present

## 2018-10-23 DIAGNOSIS — Z6834 Body mass index (BMI) 34.0-34.9, adult: Secondary | ICD-10-CM | POA: Diagnosis not present

## 2018-10-23 DIAGNOSIS — Z7902 Long term (current) use of antithrombotics/antiplatelets: Secondary | ICD-10-CM

## 2018-10-23 DIAGNOSIS — E782 Mixed hyperlipidemia: Secondary | ICD-10-CM | POA: Diagnosis not present

## 2018-10-23 DIAGNOSIS — I420 Dilated cardiomyopathy: Secondary | ICD-10-CM | POA: Diagnosis not present

## 2018-10-23 DIAGNOSIS — I2510 Atherosclerotic heart disease of native coronary artery without angina pectoris: Secondary | ICD-10-CM | POA: Diagnosis present

## 2018-10-23 DIAGNOSIS — E785 Hyperlipidemia, unspecified: Secondary | ICD-10-CM | POA: Diagnosis not present

## 2018-10-23 DIAGNOSIS — I252 Old myocardial infarction: Secondary | ICD-10-CM

## 2018-10-23 DIAGNOSIS — I10 Essential (primary) hypertension: Secondary | ICD-10-CM | POA: Diagnosis not present

## 2018-10-23 DIAGNOSIS — Z880 Allergy status to penicillin: Secondary | ICD-10-CM

## 2018-10-23 DIAGNOSIS — F17200 Nicotine dependence, unspecified, uncomplicated: Secondary | ICD-10-CM | POA: Diagnosis not present

## 2018-10-23 DIAGNOSIS — Z9861 Coronary angioplasty status: Secondary | ICD-10-CM

## 2018-10-23 DIAGNOSIS — Z20828 Contact with and (suspected) exposure to other viral communicable diseases: Secondary | ICD-10-CM | POA: Diagnosis present

## 2018-10-23 DIAGNOSIS — I5023 Acute on chronic systolic (congestive) heart failure: Secondary | ICD-10-CM | POA: Diagnosis present

## 2018-10-23 LAB — CBC
HCT: 52.9 % — ABNORMAL HIGH (ref 39.0–52.0)
Hemoglobin: 17.8 g/dL — ABNORMAL HIGH (ref 13.0–17.0)
MCH: 30.3 pg (ref 26.0–34.0)
MCHC: 33.6 g/dL (ref 30.0–36.0)
MCV: 90.1 fL (ref 80.0–100.0)
Platelets: 241 10*3/uL (ref 150–400)
RBC: 5.87 MIL/uL — ABNORMAL HIGH (ref 4.22–5.81)
RDW: 12.9 % (ref 11.5–15.5)
WBC: 15.1 10*3/uL — ABNORMAL HIGH (ref 4.0–10.5)
nRBC: 0 % (ref 0.0–0.2)

## 2018-10-23 LAB — MRSA PCR SCREENING: MRSA by PCR: NEGATIVE

## 2018-10-23 LAB — BASIC METABOLIC PANEL
Anion gap: 13 (ref 5–15)
BUN: 7 mg/dL (ref 6–20)
CO2: 21 mmol/L — ABNORMAL LOW (ref 22–32)
Calcium: 9.3 mg/dL (ref 8.9–10.3)
Chloride: 101 mmol/L (ref 98–111)
Creatinine, Ser: 0.77 mg/dL (ref 0.61–1.24)
GFR calc Af Amer: >60 mL/min (ref >60)
GFR calc non Af Amer: >60 mL/min (ref >60)
Glucose, Bld: 167 mg/dL — ABNORMAL HIGH (ref 70–99)
Potassium: 4.1 mmol/L (ref 3.5–5.1)
Sodium: 135 mmol/L (ref 135–145)

## 2018-10-23 LAB — TROPONIN I (HIGH SENSITIVITY)
Troponin I (High Sensitivity): 1280 ng/L (ref <18)
Troponin I (High Sensitivity): 2085 ng/L (ref <18)
Troponin I (High Sensitivity): 5347 ng/L (ref <18)
Troponin I (High Sensitivity): 9501 ng/L (ref <18)

## 2018-10-23 LAB — HEPARIN LEVEL (UNFRACTIONATED): Heparin Unfractionated: 0.17 IU/mL — ABNORMAL LOW (ref 0.30–0.70)

## 2018-10-23 LAB — SARS CORONAVIRUS 2 (TAT 6-24 HRS): SARS Coronavirus 2: NEGATIVE

## 2018-10-23 MED ORDER — HEPARIN BOLUS VIA INFUSION
1500.0000 [IU] | Freq: Once | INTRAVENOUS | Status: AC
  Administered 2018-10-23: 1500 [IU] via INTRAVENOUS
  Filled 2018-10-23: qty 1500

## 2018-10-23 MED ORDER — ATORVASTATIN CALCIUM 80 MG PO TABS
80.0000 mg | ORAL_TABLET | Freq: Every day | ORAL | Status: DC
  Administered 2018-10-23: 80 mg via ORAL
  Filled 2018-10-23: qty 1

## 2018-10-23 MED ORDER — NITROGLYCERIN IN D5W 200-5 MCG/ML-% IV SOLN
0.0000 ug/min | INTRAVENOUS | Status: DC
  Administered 2018-10-23: 5 ug/min via INTRAVENOUS
  Filled 2018-10-23: qty 250

## 2018-10-23 MED ORDER — SODIUM CHLORIDE 0.9 % WEIGHT BASED INFUSION
3.0000 mL/kg/h | INTRAVENOUS | Status: AC
  Administered 2018-10-23: 3 mL/kg/h via INTRAVENOUS

## 2018-10-23 MED ORDER — ONDANSETRON HCL 4 MG/2ML IJ SOLN
4.0000 mg | Freq: Once | INTRAMUSCULAR | Status: AC
  Administered 2018-10-23: 4 mg via INTRAVENOUS
  Filled 2018-10-23: qty 2

## 2018-10-23 MED ORDER — CARVEDILOL 3.125 MG PO TABS
3.1250 mg | ORAL_TABLET | Freq: Two times a day (BID) | ORAL | Status: DC
  Administered 2018-10-23 – 2018-10-25 (×4): 3.125 mg via ORAL
  Filled 2018-10-23 (×4): qty 1

## 2018-10-23 MED ORDER — SODIUM CHLORIDE 0.9 % IV SOLN: 250.0000 mL | INTRAVENOUS | Status: DC | PRN

## 2018-10-23 MED ORDER — EZETIMIBE 10 MG PO TABS
10.0000 mg | ORAL_TABLET | Freq: Every day | ORAL | Status: DC
  Administered 2018-10-23 – 2018-10-25 (×3): 10 mg via ORAL
  Filled 2018-10-23 (×3): qty 1

## 2018-10-23 MED ORDER — SODIUM CHLORIDE 0.9% FLUSH: 3.0000 mL | Freq: Once | INTRAVENOUS | Status: DC

## 2018-10-23 MED ORDER — ONDANSETRON HCL 4 MG/2ML IJ SOLN: 4.0000 mg | Freq: Four times a day (QID) | INTRAMUSCULAR | Status: DC | PRN

## 2018-10-23 MED ORDER — SODIUM CHLORIDE 0.9% FLUSH
3.0000 mL | Freq: Two times a day (BID) | INTRAVENOUS | Status: DC
  Administered 2018-10-23 – 2018-10-25 (×4): 3 mL via INTRAVENOUS

## 2018-10-23 MED ORDER — SODIUM CHLORIDE 0.9% FLUSH: 3.0000 mL | INTRAVENOUS | Status: DC | PRN

## 2018-10-23 MED ORDER — ASPIRIN 81 MG PO CHEW
324.0000 mg | CHEWABLE_TABLET | Freq: Once | ORAL | Status: AC
  Administered 2018-10-23: 324 mg via ORAL
  Filled 2018-10-23: qty 4

## 2018-10-23 MED ORDER — ASPIRIN EC 81 MG PO TBEC
81.0000 mg | DELAYED_RELEASE_TABLET | Freq: Every day | ORAL | Status: DC
  Administered 2018-10-24: 81 mg via ORAL
  Filled 2018-10-23: qty 1

## 2018-10-23 MED ORDER — HEPARIN BOLUS VIA INFUSION
4000.0000 [IU] | Freq: Once | INTRAVENOUS | Status: AC
  Administered 2018-10-23: 4000 [IU] via INTRAVENOUS
  Filled 2018-10-23: qty 4000

## 2018-10-23 MED ORDER — SODIUM CHLORIDE 0.9 % WEIGHT BASED INFUSION
1.0000 mL/kg/h | INTRAVENOUS | Status: DC
  Administered 2018-10-24: 0.969 mL/kg/h via INTRAVENOUS

## 2018-10-23 MED ORDER — HEPARIN (PORCINE) 25000 UT/250ML-% IV SOLN
1850.0000 [IU]/h | INTRAVENOUS | Status: DC
  Administered 2018-10-23: 1600 [IU]/h via INTRAVENOUS
  Administered 2018-10-24: 1850 [IU]/h via INTRAVENOUS
  Filled 2018-10-23 (×2): qty 250

## 2018-10-23 MED ORDER — MORPHINE SULFATE (PF) 4 MG/ML IV SOLN
4.0000 mg | Freq: Once | INTRAVENOUS | Status: AC
  Administered 2018-10-23: 4 mg via INTRAVENOUS
  Filled 2018-10-23: qty 1

## 2018-10-23 MED ORDER — NITROGLYCERIN 0.4 MG SL SUBL
0.4000 mg | SUBLINGUAL_TABLET | SUBLINGUAL | Status: DC | PRN
  Administered 2018-10-23 (×2): 0.4 mg via SUBLINGUAL
  Filled 2018-10-23: qty 1

## 2018-10-23 MED ORDER — ACETAMINOPHEN 325 MG PO TABS
650.0000 mg | ORAL_TABLET | ORAL | Status: DC | PRN
  Administered 2018-10-24: 04:00:00 650 mg via ORAL
  Filled 2018-10-23: qty 2

## 2018-10-23 NOTE — Progress Notes (Signed)
ANTICOAGULATION CONSULT NOTE - Initial Consult  Pharmacy Consult for heparin Indication: ACS/NSTEMI  Allergies  Allergen Reactions  . Penicillins Other (See Comments)    unknown    Patient Measurements: Height: 6\' 2"  (188 cm) Weight: 280 lb (127 kg) IBW/kg (Calculated) : 82.2 Heparin Dosing Weight: 110 kg  Vital Signs: Temp: 98.4 F (36.9 C) (08/06 1211) Temp Source: Oral (08/06 1208) BP: 152/113 (08/06 1211) Pulse Rate: 89 (08/06 1339)  Labs: Recent Labs    10/23/18 1219  HGB 17.8*  HCT 52.9*  PLT 241  CREATININE 0.77  TROPONINIHS 1,280*    Estimated Creatinine Clearance: 177.3 mL/min (by C-G formula based on SCr of 0.77 mg/dL).   Medical History: Past Medical History:  Diagnosis Date  . CAD S/P percutaneous coronary angioplasty 11/20/2013   a. 09/2009: EKG with Inf STEMI - no obstructive CAD;  b. 11/2013 Inflat STEMI/PCI: LM nl, LAD 20p, D1 sm - diff dzs, D2 large - nl, LCX 95-99, OM1 100 (3.5x38 Promus Premier DES), RCA 95-23m, 80d (3.0x20 and 3.0x16 Promus Premier DES');  c. 11/2013 Echo: Ef 55-60%, no rwma.   . Essential hypertension   . History of echocardiogram    a. 11/2016 Echo: EF 55-60%, no rwma.  . Hyperlipidemia with target LDL less than 100   . Marijuana abuse   . Migraine   . Morbid obesity ([place])   . ST-segment elevation myocardial infarction (STEMI) of inferior wall ([place]) 11/20/2013    Medications:  Scheduled:  . sodium chloride flush  3 mL Intravenous Once   Infusions:    Assessment: 22 yoM admitted for chest pain/NSTEMI. Trop 1280 upon admission. No anticoag PTA except for aspirin 81 mg and Plavix 75 mg for hx of NSTEMI and stenting. Pharmacy consulted to dose heparin.  CBC wnl, Hgb 17.8, Plt 241. Renal function stable, Scr 0.77. No bleeding noted.   Goal of Therapy:  Heparin level 0.3-0.7 units/ml Monitor platelets by anticoagulation protocol: Yes   Plan:  Give heparin bolus 4000 units Start heparin infusion at 1600  units/hr Heparin level in 6 hours Monitor daily heparin levels, CBC, s/sx of bleeding  Thank you for involving pharmacy in this patient's care.  Berenice Bouton, PharmD PGY1 Pharmacy Resident Office phone: [phone]  10/23/2018,2:13 PM

## 2018-10-23 NOTE — ED Notes (Signed)
Started NS 2 125 ml/hr per cards NP

## 2018-10-23 NOTE — Progress Notes (Signed)
VAST consulted to place 2nd PIV for cath lab. Upon arrival at bedside, spoke with pt's nurse who stated pt won't be going for cardiac cath until tomorrow as they are awaiting his COVID-19 test results. Recommended IV be placed in morning to allow for vein preservation for procedure.

## 2018-10-23 NOTE — Plan of Care (Signed)

## 2018-10-23 NOTE — Progress Notes (Signed)
    Called Lab to verify COVID swab sent was appropriate for rapid screen. They confirmed and reported results should be back within 30-45 mins. Planned for cath once resulted.   SignedReino Bellis, NP-C 10/23/2018, 4:11 PM Pager: [phone]

## 2018-10-23 NOTE — Progress Notes (Signed)
Notified by RN of patient's hsTN trend: 2085 -> 5347 -> 9501.  EKG repeated and unchanged from prior.  At bedside, patient comfortable, in no distress, pain free. Noted to be HTNsive to 170s/110s; will order nitro gtt to titrate for afterload reduction.   Maika Mcelveen K. Marletta Lor, MD

## 2018-10-23 NOTE — ED Notes (Signed)
Report attempted 

## 2018-10-23 NOTE — Progress Notes (Signed)
ANTICOAGULATION CONSULT NOTE - Initial Consult  Pharmacy Consult for heparin Indication: ACS/NSTEMI  Allergies  Allergen Reactions  . Penicillins Other (See Comments)    Severe due to asthma Did it involve swelling of the face/tongue/throat, SOB, or low BP? No Did it involve sudden or severe rash/hives, skin peeling, or any reaction on the inside of your mouth or nose? No Did you need to seek medical attention at a hospital or doctor's office? No When did it last happen?child If all above answers are "NO", may proceed with cephalosporin use.    Patient Measurements: Height: 6\' 2"  (188 cm) Weight: 273 lb 5.9 oz (124 kg) IBW/kg (Calculated) : 82.2 Heparin Dosing Weight: 110 kg  Vital Signs: Temp: 99 F (37.2 C) (08/06 1936) Temp Source: Oral (08/06 1936) BP: 166/130 (08/06 1936) Pulse Rate: 96 (08/06 1936)  Labs: Recent Labs    10/23/18 1219 10/23/18 1411 10/23/18 1812 10/23/18 2110  HGB 17.8*  --   --   --   HCT 52.9*  --   --   --   PLT 241  --   --   --   HEPARINUNFRC  --   --   --  0.17*  CREATININE 0.77  --   --   --   TROPONINIHS 1,280* 2,085* 5,347*  --     Estimated Creatinine Clearance: 175.1 mL/min (by C-G formula based on SCr of 0.77 mg/dL).  Assessment: 52 yoM admitted for chest pain/NSTEMI. Trop 1280 upon admission. No anticoag PTA except for aspirin 81 mg and Plavix 75 mg for hx of NSTEMI and stenting.   Initial hep lvl low 0.17  Goal of Therapy:  Heparin level 0.3-0.7 units/ml Monitor platelets by anticoagulation protocol: Yes   Plan:  Heparin bolus 1500 units x 1 Increase hep gtt 1850 units/hr Next lvl am labs Cath in am  Levester Fresh, PharmD, BCPS, BCCCP Clinical Pharmacist [phone]  Please check AMION for all [place] numbers  10/23/2018 9:43 PM

## 2018-10-23 NOTE — ED Notes (Signed)
Confirmed that no additional covid swab is needed with Reino Bellis, NP.

## 2018-10-23 NOTE — Progress Notes (Signed)
    Called back to the lab as COVID has not resulted. Now informed this was not a Rapid screen and would not result until 8pm. Review with Dr. Johnsie Cancel and will plan for cath early case in the morning. Will order diet. Informed bedside RN. Cath ordered to be started in the am.   Signed, Reino Bellis, NP-C 10/23/2018, 5:06 PM Pager: [phone]

## 2018-10-23 NOTE — H&P (Addendum)
Cardiology Admission History and Physical:   Patient ID: Reginald Fernandez MRN: [id number]; DOB: [birth year]   Admission date: 10/23/2018  Primary Care Provider: Patient, No Pcp Per Primary Cardiologist: Bryan Lemma, MD  Primary Electrophysiologist:  None   Chief Complaint:  Chest pain, + troponin  Patient Profile:   Reginald Fernandez is a 38 y.o. male with PMH of CAD with multiple stents (Lcx/OM1, and RCA), HTN, HL and obesity who presented with chest pain. Found to have an elevated HsT.  History of Present Illness:   Reginald Fernandez is a 38 yo male with PMH noted above.  He presented with inferior STEMI in 2011 with a cath at that time showing nonobstructive disease.  Re-presented in 2015 and found to have severe disease of the left circumflex/OM and RCA.  These were both treated with drug-eluting stents.  Was followed in the office for a brief period of time afterwards.  Presented again back in May 2018 after playing in a kickball game and developed chest pain shortly afterwards with him and his wife were at [employer].  During that admission he was found to have a non-STEMI and transferred to [facility] for [facility] evaluation.  Troponin peaked at 0.7.  Underwent left heart cath with Dr. Excell Seltzer showing two-vessel disease but with patent stents in the RCA and OM vessel of left circumflex with normal LVEDP.  It was unclear what his culprit was for troponin elevation at that time.  He was placed on Plavix post cath.  Echocardiogram that admission showed an EF of 35 to 40% with basal anterior lateral hypokinesis and mild apical akinesis with grade 2 diastolic dysfunction.  His meds were adjusted added on spironolactone.  He felt well post cath and was planned to follow-up in the office with a repeat echocardiogram in 6 weeks to assess LV function.  Appears he was lost to follow-up.  States he has not had any significant issues leading up to this admission.  Unfortunately he has not been on any home medications for  at least 6 months.  States he had issues with his insurance and never resumed or refilled any of his medications.  He denies tobacco use, but does report using marijuana 3 days ago.  States last evening he was in his usual state of health.  He coughed and developed left-sided sharp chest pain.  States this persisted throughout the evening.  He did take 4 baby aspirin which improved his symptoms but did not fully resolve them.  Was actually evaluated by EMS last evening but told his EKG showed no reported change.  Therefore he did not present to the ED.  This morning he woke up with residual pain and presented to the ED for further evaluation.  In the ED his labs showed stable electrolytes, WBC 15.1, hemoglobin 17.8, high-sensitivity troponin 1280.  Chest x-ray showed cardiac enlargement but no edema.  EKG showed sinus rhythm..  He was given 2 sublingual nitroglycerin while in the ED which did improve his chest discomfort but still present at the time of exam.  Heart Pathway Score:     Past Medical History:  Diagnosis Date  . CAD S/P percutaneous coronary angioplasty 11/20/2013   a. 09/2009: EKG with Inf STEMI - no obstructive CAD;  b. 11/2013 Inflat STEMI/PCI: LM nl, LAD 20p, D1 sm - diff dzs, D2 large - nl, LCX 95-99, OM1 100 (3.5x38 Promus Premier DES), RCA 95-85m, 80d (3.0x20 and 3.0x16 Promus Premier DES');  c. 11/2013 Echo: Ef 55-60%,  no rwma.   . Essential hypertension   . History of echocardiogram    a. 11/2016 Echo: EF 55-60%, no rwma.  . Hyperlipidemia with target LDL less than 100   . Marijuana abuse   . Migraine   . Morbid obesity (HCC)   . ST-segment elevation myocardial infarction (STEMI) of inferior wall (HCC) 11/20/2013    Past Surgical History:  Procedure Laterality Date  . CARDIAC CATHETERIZATION  09/2009;    Mild RCA luminal Irregularities - MIld INf HK -- Med Rx; b)  . LEFT HEART CATH AND CORONARY ANGIOGRAPHY N/A 08/01/2016   Procedure: Left Heart Cath and Coronary Angiography;   Surgeon: Tonny Bollmanooper, Michael, MD;  [facility];  Service: Cardiovascular;  Laterality: N/A;  . LEFT HEART CATHETERIZATION WITH CORONARY ANGIOGRAM N/A 11/20/2013   Procedure: LEFT HEART CATHETERIZATION WITH CORONARY ANGIOGRAM;  Surgeon: Marykay Lexavid W Harding, MD;  Location[facility] CATH LAB;  Service: Cardiovascular;   Inf STEMI: OM1 100%, m-dRCA 95-99% thrombotic mRCA with tandem 80% -- PCI, LAD & Cx relatively normal.  . PERCUTANEOUS CORONARY STENT INTERVENTION (PCI-S)  11/20/2013   a) PCI - OM1 Promus Premier DES 3.5 mm x 38 mm, m-d RCA Promus Premier DES  3.0 mm x 20 mm & 3.0 mm x 16 mm  . TRANSTHORACIC ECHOCARDIOGRAM  11/20/2013   LV EF 55-60%. No regional wall motion abnormality.  Normal valves      Medications Prior to Admission: Prior to Admission medications   Medication Sig Start Date End Date Taking? Authorizing Provider  acetaminophen (TYLENOL) 500 MG tablet Take 500-1,000 mg by mouth every 6 (six) hours as needed for moderate pain or headache.    [provider]  albuterol (PROVENTIL HFA;VENTOLIN HFA) 108 (90 BASE) MCG/ACT inhaler Inhale 2 puffs into the lungs every 6 (six) hours as needed for wheezing or shortness of breath.    [provider]  aspirin 81 MG tablet Take 81 mg by mouth daily.     [provider]  atorvastatin (LIPITOR) 80 MG tablet Take 1 tablet (80 mg total) by mouth daily at 6 PM. NEED OV. 10/07/17   Marykay LexHarding, David W, MD  carvedilol (COREG) 12.5 MG tablet Take 1 tablet (12.5 mg total) by mouth 2 (two) times daily with a meal. NEED OV. 10/07/17   Marykay LexHarding, David W, MD  clopidogrel (PLAVIX) 75 MG tablet TAKE 1 TABLET BY MOUTH DAILY 05/02/17   Marykay LexHarding, David W, MD  ezetimibe (ZETIA) 10 MG tablet TAKE 1 TABLET BY MOUTH DAILY. 07/20/16   Marykay LexHarding, David W, MD  hydrochlorothiazide (HYDRODIURIL) 25 MG tablet Take 1 tablet (25 mg total) by mouth daily. PT OVERDUE FOR OV PLEASE CALL FOR APPT 09/23/17   Marykay LexHarding, David W, MD  hydrochlorothiazide (HYDRODIURIL)  25 MG tablet Take 1 tablet (25 mg total) by mouth daily. NEED OV. 10/07/17   Marykay LexHarding, David W, MD  lisinopril (PRINIVIL,ZESTRIL) 20 MG tablet Take 1 tablet (20 mg total) by mouth daily. PT OVERDUE FOR OV PLEASE CALL FOR APPT 09/23/17   Marykay LexHarding, David W, MD  lisinopril (PRINIVIL,ZESTRIL) 20 MG tablet Take 1 tablet (20 mg total) by mouth daily. NEED OV. 10/07/17   Marykay LexHarding, David W, MD  nitroGLYCERIN (NITROSTAT) 0.4 MG SL tablet Place 1 tablet (0.4 mg total) under the tongue every 5 (five) minutes as needed for chest pain. 11/23/13   Creig HinesBerge, Christopher Ronald, NP  promethazine (PHENERGAN) 25 MG tablet Take 1 tablet (25 mg total) by mouth every 6 (six) hours as needed  for nausea or vomiting. 06/02/17   Ward, Layla MawKristen N, DO  spironolactone (ALDACTONE) 25 MG tablet Take 1 tablet (25 mg total) by mouth daily. 12/11/16   Marykay LexHarding, David W, MD     Allergies:    Allergies  Allergen Reactions  . Penicillins Other (See Comments)    unknown    Social History:   Social History   Socioeconomic History  . Marital status: Married    Spouse name: Not on file  . Number of children: 2  . Years of education: Not on file  . Highest education level: Not on file  Occupational History    Employer: [employer]    [place] [employer]  Social Needs  . Financial resource strain: Not on file  . Food insecurity    Worry: Not on file    Inability: Not on file  . Transportation needs    Medical: Not on file    Non-medical: Not on file  Tobacco Use  . Smoking status: Current Some Day Smoker    Last attempt to quit: 11/20/2009    Years since quitting: 8.9  . Smokeless tobacco: Never Used  Substance and Sexual Activity  . Alcohol use: No    Alcohol/week: 0.0 standard drinks  . Drug use: No  . Sexual activity: Not on file  Lifestyle  . Physical activity    Days per week: Not on file    Minutes per session: Not on file  . Stress: Not on file  Relationships  . Social Musicianconnections    Talks on  phone: Not on file    Gets together: Not on file    Attends religious service: Not on file    Active member of club or organization: Not on file    Attends meetings of clubs or organizations: Not on file    Relationship status: Not on file  . Intimate partner violence    Fear of current or ex partner: Not on file    Emotionally abused: Not on file    Physically abused: Not on file    Forced sexual activity: Not on file  Other Topics Concern  . Not on file  Social History Narrative   [place]  Works with mentally challenged adults.    Family History:  The patient's family history includes Heart attack (age of onset: 3245) in his father; Heart failure in his father; Hyperlipidemia in his father; Hypertension in his father and mother.    ROS:  Please see the history of present illness.  All other ROS reviewed and negative.     Physical Exam/Data:   Vitals:   10/23/18 1208 10/23/18 1211 10/23/18 1339 10/23/18 1346  BP:  (!) 152/113    Pulse: 92  89   Resp: 16  15   Temp:  98.4 F (36.9 C)    TempSrc: Oral     SpO2: 99%  99%   Weight:    127 kg  Height:    6\' 2"  (1.88 m)   No intake or output data in the 24 hours ending 10/23/18 1415 Last 3 Weights 10/23/2018 06/01/2017 08/02/2016  Weight (lbs) 280 lb 220 lb 259 lb 11.2 oz  Weight (kg) 127.007 kg 99.791 kg 117.799 kg     Body mass index is 35.95 kg/m.  General:  Well nourished, well developed, young African-American male, in no acute distress HEENT: normal Neck: no JVD Vascular: No carotid bruits Cardiac:  normal S1, S2; RRR; no murmur  Lungs:  clear to auscultation bilaterally, no wheezing, rhonchi or rales  Abd: soft, nontender, no hepatomegaly  Ext: no edema Musculoskeletal:  No deformities, BUE and BLE strength normal and equal Skin: warm and dry  Neuro:  CNs 2-12 intact, no focal abnormalities noted Psych:  Normal affect    EKG:  The ECG that was done 10/23/18 was personally reviewed and demonstrates  sinus rhythm with atrial enlargement and left posterior fascicular block, prior anterior inferior infarct.   Relevant CV Studies:  Cath: 08/01/16  1. Two-vessel coronary artery disease with continued patency of the stented segments in the right coronary artery and obtuse marginal branch of the circumflex 2. Diffuse coronary irregularity/nonobstructive disease stable from previous angiogram in 2015 3. Moderate segmental LV dysfunction with normal LVEDP  Recommendation: The patient appears to have stable coronary anatomy from his previous study. I cannot identify a culprit lesion for non-ST elevation infarction. Would consider addition of Plavix 75 mg daily for medical treatment of acute coronary syndrome. Otherwise he should continue on his current medical regimen. Will check an echo to better assess LV function.  TTE: 5/18  Study Conclusions  - Left ventricle: The cavity size was moderately dilated. Wall   thickness was normal. Basal anterolateral hypokinesis. Basal to   mid inferolateral akinesis. Mid to apical inferior akinesis.   Systolic function was moderately reduced. The estimated ejection   fraction was in the range of 35% to 40%. Features are consistent   with a pseudonormal left ventricular filling pattern, with   concomitant abnormal relaxation and increased filling pressure   (grade 2 diastolic dysfunction). - Aortic valve: There was no stenosis. - Aorta: Borderline dilated aortic root. Aortic root dimension: 37   mm (ED). - Mitral valve: There was no significant regurgitation. - Left atrium: The atrium was moderately dilated. - Right ventricle: The cavity size was normal. Systolic function   was normal. - Right atrium: The atrium was mildly dilated. - Pulmonary arteries: No complete TR doppler jet so unable to   estimate PA systolic pressure. - Inferior vena cava: The vessel was normal in size. The   respirophasic diameter changes were in the normal range (>= 50%),    consistent with normal central venous pressure.  Impressions:  - Moderately dilated LV with EF 35-40%. Wall motion abnormalities   as noted above. Moderate diastolic dysfunction. Normal RV size   and systolic function. No significant valvular abnormalities.   Laboratory Data:  High Sensitivity Troponin:   Recent Labs  Lab 10/23/18 1219  TROPONINIHS 1,280*      Cardiac EnzymesNo results for input(s): TROPONINI in the last 168 hours. No results for input(s): TROPIPOC in the last 168 hours.  Chemistry Recent Labs  Lab 10/23/18 1219  NA 135  K 4.1  CL 101  CO2 21*  GLUCOSE 167*  BUN 7  CREATININE 0.77  CALCIUM 9.3  GFRNONAA >60  GFRAA >60  ANIONGAP 13    No results for input(s): PROT, ALBUMIN, AST, ALT, ALKPHOS, BILITOT in the last 168 hours. Hematology Recent Labs  Lab 10/23/18 1219  WBC 15.1*  RBC 5.87*  HGB 17.8*  HCT 52.9*  MCV 90.1  MCH 30.3  MCHC 33.6  RDW 12.9  PLT 241   BNPNo results for input(s): BNP, PROBNP in the last 168 hours.  DDimer No results for input(s): DDIMER in the last 168 hours.   Radiology/Studies:  Dg Chest 2 View  Result Date: 10/23/2018 CLINICAL DATA:  Chest pain EXAM: CHEST -  2 VIEW COMPARISON:  Jul 31, 2016 FINDINGS: No edema or consolidation. Heart is borderline enlarged with pulmonary vascularity normal. No adenopathy. No pneumothorax. No bone lesions. IMPRESSION: Borderline cardiac enlargement.  No edema or consolidation. Electronically Signed   By: Lowella Grip III M.D.   On: 10/23/2018 13:07    Assessment and Plan:   Reginald Fernandez is a 38 y.o. male with PMH of CAD with multiple stents (Lcx/OM1, and RCA), HTN, HL and obesity who presented with chest pain. Found to have an elevated HsT.  1.  Non-STEMI: Symptoms are somewhat atypical and began after a forceful cough.  States with prior episodes of ACS requiring stenting he did have angina as well as shortness of breath.  This is not reminiscent of those episodes.   Unfortunately he has not been on any medications for at least 6 months.  Given his CAD history, and elevated troponin would recommend further evaluation with coronary angiography.  I discussed this with the patient and he is willing to proceed. -- Continue IV heparin, start ASA, BB, statin -- Covid pending, will plan for cath once resulted.  2. HTN: Blood pressures are elevated in the ED. Will restart Coreg now. Consider adding losartan in the am, pending stable renal function.   3. ICM?: Echo back in 2018 showed an EF of 30-35%. He was placed on BB and spiro, in addition to lisinopril. As above, not on meds as an outpatient. Does not appear volume overloaded. Consider Entresto if EF remains poor.  No indication for lasix at this time.  -- restart coreg -- check echo  4. HL: restart high dose statin -- lipids in the am  Severity of Illness: The appropriate patient status for this patient is INPATIENT. Inpatient status is judged to be reasonable and necessary in order to provide the required intensity of service to ensure the patient's safety. The patient's presenting symptoms, physical exam findings, and initial radiographic and laboratory data in the context of their chronic comorbidities is felt to place them at high risk for further clinical deterioration. Furthermore, it is not anticipated that the patient will be medically stable for discharge from the hospital within 2 midnights of admission. The following factors support the patient status of inpatient.   " The patient's presenting symptoms include chest pain. " The worrisome physical exam findings include stable exam. " The initial radiographic and laboratory data are worrisome because of + troponin. " The chronic co-morbidities include HTN, HL, CAD.   * I certify that at the point of admission it is my clinical judgment that the patient will require inpatient hospital care spanning beyond 2 midnights from the point of admission due to  high intensity of service, high risk for further deterioration and high frequency of surveillance required.*    For questions or updates, please contact [place] Please consult [email or web address] for contact info under        Signed, Reino Bellis, NP  10/23/2018 2:15 PM   Patient examined chart reviewed Discussed care with PA and patient. Known history of CAD with multiple stents to OM and mid /distal RCA. Non compliant with meds due to insurance issues. SSCP started as atypical after coughing. Thought he had a pectoral strain and had also been working out. Also with some stress/aggravation at driving [place] [facility] to pick up car for his wife that was sold before he got there. Troponin significantly elevated with persistent intermittent pains. ECG non acute Given history and level of troponin favor  diagnostic heat cath to r/o SEMI. Heparin has been started. Right radial pulse is plus  And used during last cath. Will try to change COVID test to rapid Risks including stroke, bleeding , contrast reaction and need for emergency surgery discussed Willing to proceed Keep NPO try to cath today if COVID testing back Exam benign healthy black male clear lungs no murmur no pain to palpation on chest and plus 4 right radial pulse  Charlton HawsPeter Jesstin Studstill

## 2018-10-23 NOTE — ED Provider Notes (Signed)
[facility] Provider Note   CSN: [id number] Arrival date & time: 10/23/18  1201     History   Chief Complaint Chief Complaint  Patient presents with  . Chest Pain    HPI Reginald Fernandez is a 38 y.o. male.     The history is provided by the patient and medical records. No language interpreter was used.  Chest Pain    38 year old male with significant history of cardiac disease, status post percutaneous coronary angioplasty, hypertension, hyperlipidemia, dilated cardiomyopathy, prior NSTEMI brought here via EMS for evaluation of chest pain.  Patient report last night he developed pain to his mid chest after a heavy cough.  He described pain as a sharp pressure sensation followed with a tightness sensation and associated lightheadedness, dizziness, breakdown to sweat, felt nauseous.  Pain is nonradiating. pain was moderate in severity, EMS was called, and patient report his initial EKG was normal and vital sign was normal.  He was given 4 baby aspirin which did help his pain.  Pain still persist throughout the day today, and has increased to 6 out of 10.  No associated fever, no significant shortness of breath, no chills, body aches.  He denies any recent alcohol use, admits to occasional marijuana use, last use was 3 days ago.  Denies any cocaine use.  He does have significant cardiac history with cardiac stenting.  His cardiologist is Dr. Herbie BaltimoreHarding.  He denies any recent strength activity he denies any recent sick contact with anyone with COVID-19.  Patient reports significant family history of cardiac disease.  Past Medical History:  Diagnosis Date  . CAD S/P percutaneous coronary angioplasty 11/20/2013   a. 09/2009: EKG with Inf STEMI - no obstructive CAD;  b. 11/2013 Inflat STEMI/PCI: LM nl, LAD 20p, D1 sm - diff dzs, D2 large - nl, LCX 95-99, OM1 100 (3.5x38 Promus Premier DES), RCA 95-17101m, 80d (3.0x20 and 3.0x16 Promus Premier DES');  c. 11/2013 Echo:  Ef 55-60%, no rwma.   . Essential hypertension   . History of echocardiogram    a. 11/2016 Echo: EF 55-60%, no rwma.  . Hyperlipidemia with target LDL less than 100   . Marijuana abuse   . Migraine   . Morbid obesity (HCC)   . ST-segment elevation myocardial infarction (STEMI) of inferior wall (HCC) 11/20/2013    Patient Active Problem List   Diagnosis Date Noted  . Dilated cardiomyopathy (HCC) 08/01/2016  . Acute combined systolic and diastolic heart failure (HCC) 08/01/2016  . ACS (acute coronary syndrome) (HCC) 07/31/2016  . NSTEMI (non-ST elevated myocardial infarction) (HCC) 07/31/2016  . Snoring 02/22/2014  . Daytime somnolence 02/22/2014  . Snores 11/23/2013  . Marijuana abuse   . History of acute inferior wall MI 11/20/2013  . ST elevation myocardial infarction (STEMI) of true posterior wall, subsequent episode of care (HCC) 11/20/2013  . CAD S/P percutaneous coronary angioplasty: DES PCI -Cx-OM1, RCA x 2 11/20/2013    Class: Status post  . Essential hypertension   . HYPERGLYCEMIA 10/25/2009  . ASTHMA 10/24/2009    Past Surgical History:  Procedure Laterality Date  . CARDIAC CATHETERIZATION  09/2009;    Mild RCA luminal Irregularities - MIld INf HK -- Med Rx; b)  . LEFT HEART CATH AND CORONARY ANGIOGRAPHY N/A 08/01/2016   Procedure: Left Heart Cath and Coronary Angiography;  Surgeon: Tonny Bollmanooper, Michael, MD;  [facility];  Service: Cardiovascular;  Laterality: N/A;  . LEFT HEART CATHETERIZATION WITH CORONARY ANGIOGRAM  N/A 11/20/2013   Procedure: LEFT HEART CATHETERIZATION WITH CORONARY ANGIOGRAM;  Surgeon: Marykay Lex, MD;  Location: [facility] CATH LAB;  Service: Cardiovascular;   Inf STEMI: OM1 100%, m-dRCA 95-99% thrombotic mRCA with tandem 80% -- PCI, LAD & Cx relatively normal.  . PERCUTANEOUS CORONARY STENT INTERVENTION (PCI-S)  11/20/2013   a) PCI - OM1 Promus Premier DES 3.5 mm x 38 mm, m-d RCA Promus Premier DES  3.0 mm x 20 mm & 3.0 mm x 16 mm  .  TRANSTHORACIC ECHOCARDIOGRAM  11/20/2013   LV EF 55-60%. No regional wall motion abnormality.  Normal valves         Home Medications    Prior to Admission medications   Medication Sig Start Date End Date Taking? Authorizing Provider  acetaminophen (TYLENOL) 500 MG tablet Take 500-1,000 mg by mouth every 6 (six) hours as needed for moderate pain or headache.    [provider]  albuterol (PROVENTIL HFA;VENTOLIN HFA) 108 (90 BASE) MCG/ACT inhaler Inhale 2 puffs into the lungs every 6 (six) hours as needed for wheezing or shortness of breath.    [provider]  aspirin 81 MG tablet Take 81 mg by mouth daily.     [provider]  atorvastatin (LIPITOR) 80 MG tablet Take 1 tablet (80 mg total) by mouth daily at 6 PM. NEED OV. 10/07/17   Marykay Lex, MD  carvedilol (COREG) 12.5 MG tablet Take 1 tablet (12.5 mg total) by mouth 2 (two) times daily with a meal. NEED OV. 10/07/17   Marykay Lex, MD  clopidogrel (PLAVIX) 75 MG tablet TAKE 1 TABLET BY MOUTH DAILY 05/02/17   Marykay Lex, MD  ezetimibe (ZETIA) 10 MG tablet TAKE 1 TABLET BY MOUTH DAILY. 07/20/16   Marykay Lex, MD  hydrochlorothiazide (HYDRODIURIL) 25 MG tablet Take 1 tablet (25 mg total) by mouth daily. PT OVERDUE FOR OV PLEASE CALL FOR APPT 09/23/17   Marykay Lex, MD  hydrochlorothiazide (HYDRODIURIL) 25 MG tablet Take 1 tablet (25 mg total) by mouth daily. NEED OV. 10/07/17   Marykay Lex, MD  lisinopril (PRINIVIL,ZESTRIL) 20 MG tablet Take 1 tablet (20 mg total) by mouth daily. PT OVERDUE FOR OV PLEASE CALL FOR APPT 09/23/17   Marykay Lex, MD  lisinopril (PRINIVIL,ZESTRIL) 20 MG tablet Take 1 tablet (20 mg total) by mouth daily. NEED OV. 10/07/17   Marykay Lex, MD  nitroGLYCERIN (NITROSTAT) 0.4 MG SL tablet Place 1 tablet (0.4 mg total) under the tongue every 5 (five) minutes as needed for chest pain. 11/23/13   Creig Hines, NP  promethazine (PHENERGAN) 25 MG tablet Take  1 tablet (25 mg total) by mouth every 6 (six) hours as needed for nausea or vomiting. 06/02/17   Ward, Layla Maw, DO  spironolactone (ALDACTONE) 25 MG tablet Take 1 tablet (25 mg total) by mouth daily. 12/11/16   Marykay Lex, MD    Family History Family History  Problem Relation Age of Onset  . Heart attack Father 43       2 MIs by age 60 -- CABG  . Hypertension Father   . Heart failure Father   . Hyperlipidemia Father   . Hypertension Mother     Social History Social History   Tobacco Use  . Smoking status: Current Some Day Smoker    Last attempt to quit: 11/20/2009    Years since quitting: 8.9  . Smokeless tobacco: Never Used  Substance Use Topics  .  Alcohol use: No    Alcohol/week: 0.0 standard drinks  . Drug use: No     Allergies   Penicillins   Review of Systems Review of Systems  Cardiovascular: Positive for chest pain.  All other systems reviewed and are negative.    Physical Exam Updated Vital Signs BP (!) 152/113 (BP Location: Left Arm)   Pulse 89   Temp 98.4 F (36.9 C)   Resp 15   SpO2 99%   Physical Exam Vitals signs and nursing note reviewed.  Constitutional:      General: He is not in acute distress.    Appearance: He is well-developed. He is obese.  HENT:     Head: Atraumatic.  Eyes:     Conjunctiva/sclera: Conjunctivae normal.  Neck:     Musculoskeletal: Neck supple.  Cardiovascular:     Rate and Rhythm: Normal rate and regular rhythm.     Pulses: Normal pulses.     Heart sounds: Normal heart sounds.  Pulmonary:     Effort: Pulmonary effort is normal.     Breath sounds: Normal breath sounds.  Chest:     Chest wall: Tenderness (Tenderness to anterior chest on palpation) present.  Abdominal:     Palpations: Abdomen is soft.     Tenderness: There is no abdominal tenderness.  Musculoskeletal:        General: No swelling.  Skin:    Capillary Refill: Capillary refill takes less than 2 seconds.     Findings: No rash.   Neurological:     Mental Status: He is alert and oriented to person, place, and time.  Psychiatric:        Mood and Affect: Mood normal.      ED Treatments / Results  Labs (all labs ordered are listed, but only abnormal results are displayed) Labs Reviewed  BASIC METABOLIC PANEL - Abnormal; Notable for the following components:      Result Value   CO2 21 (*)    Glucose, Bld 167 (*)    All other components within normal limits  CBC - Abnormal; Notable for the following components:   WBC 15.1 (*)    RBC 5.87 (*)    Hemoglobin 17.8 (*)    HCT 52.9 (*)    All other components within normal limits  TROPONIN I (HIGH SENSITIVITY) - Abnormal; Notable for the following components:   Troponin I (High Sensitivity) 1,280 (*)    All other components within normal limits  TROPONIN I (HIGH SENSITIVITY)    EKG EKG Interpretation  Date/Time:  Thursday October 23 2018 12:08:54 EDT Ventricular Rate:  90 PR Interval:  168 QRS Duration: 112 QT Interval:  368 QTC Calculation: 450 R Axis:   171 Text Interpretation:  Normal sinus rhythm Biatrial enlargement Left posterior fascicular block Inferior infarct , age undetermined Anterior infarct , age undetermined Abnormal ECG Wandering baseline Nonspecific ST changes in comparison to prior Confirmed by Gareth Morgan [phone]) on 10/23/2018 1:28:57 PM   Radiology Dg Chest 2 View  Result Date: 10/23/2018 CLINICAL DATA:  Chest pain EXAM: CHEST - 2 VIEW COMPARISON:  Jul 31, 2016 FINDINGS: No edema or consolidation. Heart is borderline enlarged with pulmonary vascularity normal. No adenopathy. No pneumothorax. No bone lesions. IMPRESSION: Borderline cardiac enlargement.  No edema or consolidation. Electronically Signed   By: Lowella Grip III M.D.   On: 10/23/2018 13:07    Procedures .Critical Care Performed by: Domenic Moras, PA-C Authorized by: Domenic Moras, PA-C   Critical care provider  statement:    Critical care time (minutes):  45    Critical care was time spent personally by me on the following activities:  Discussions with consultants, evaluation of patient's response to treatment, examination of patient, ordering and performing treatments and interventions, ordering and review of laboratory studies, ordering and review of radiographic studies, pulse oximetry, re-evaluation of patient's condition, obtaining history from patient or surrogate and review of old charts   (including critical care time)  Medications Ordered in ED Medications  sodium chloride flush (NS) 0.9 % injection 3 mL (has no administration in time range)  nitroGLYCERIN (NITROSTAT) SL tablet 0.4 mg (has no administration in time range)  morphine 4 MG/ML injection 4 mg (has no administration in time range)  ondansetron (ZOFRAN) injection 4 mg (has no administration in time range)  aspirin chewable tablet 324 mg (324 mg Oral Given 10/23/18 1352)     Initial Impression / Assessment and Plan / ED Course  I have reviewed the triage vital signs and the nursing notes.  Pertinent labs & imaging results that were available during my care of the patient were reviewed by me and considered in my medical decision making (see chart for details).        BP (!) 152/113 (BP Location: Left Arm)   Pulse 89   Temp 98.4 F (36.9 C)   Resp 15   Ht 6\' 2"  (1.88 m)   Wt 127 kg   SpO2 99%   BMI 35.95 kg/m    Final Clinical Impressions(s) / ED Diagnoses   Final diagnoses:  NSTEMI (non-ST elevated myocardial infarction) [facility](HCC)    ED Discharge Orders    None     2:00 PM Patient with significant cardiac history presenting with midsternal chest pain after cough.  Pain still present and rates a 6 out of 10.  He was found to have nonspecific ST changes on EKG and a markedly elevated high-sensitivity troponin of 1280.  Chest x-ray unremarkable, elevated white count of 15.1.  Will consult cardiology for further care.  2:10 PM Appreciate consultation with Cardiology  who recommend starting pt on Heparin and they will see and admit pt for further care.  Care discussed with Dr. Dalene SeltzerSchlossman.  COVID-19 test ordered.    Reginald Fernandez was evaluated in Emergency Department on 10/23/2018 for the symptoms described in the history of present illness. He was evaluated in the context of the global COVID-19 pandemic, which necessitated consideration that the patient might be at risk for infection with the SARS-CoV-2 virus that causes COVID-19. Institutional protocols and algorithms that pertain to the evaluation of patients at risk for COVID-19 are in a state of rapid change based on information released by regulatory bodies including the CDC and federal and state organizations. These policies and algorithms were followed during the patient's care in the ED.    Fayrene Helperran, Sage Kopera, PA-C 10/23/18 1411    Alvira MondaySchlossman, Erin, MD 10/25/18 1144

## 2018-10-23 NOTE — ED Triage Notes (Signed)
Pt states that he began having chest pain last night. EMS came out and completed an EKG. Reported no abnormal findings. Pt states that he has had 3 stents in the past. Reports fatigue.

## 2018-10-24 ENCOUNTER — Encounter (HOSPITAL_COMMUNITY): Payer: Self-pay | Admitting: Nurse Practitioner

## 2018-10-24 ENCOUNTER — Inpatient Hospital Stay (HOSPITAL_COMMUNITY): Payer: BC Managed Care – PPO

## 2018-10-24 ENCOUNTER — Encounter (HOSPITAL_COMMUNITY)
Admission: EM | Disposition: A | Discharge status: Home / Self Care | Payer: BC Managed Care – PPO | Source: Home / Self Care | Attending: Cardiovascular Disease

## 2018-10-24 DIAGNOSIS — E782 Mixed hyperlipidemia: Secondary | ICD-10-CM

## 2018-10-24 DIAGNOSIS — I214 Non-ST elevation (NSTEMI) myocardial infarction: Secondary | ICD-10-CM

## 2018-10-24 HISTORY — PX: LEFT HEART CATH AND CORONARY ANGIOGRAPHY: CATH118249

## 2018-10-24 HISTORY — PX: TRANSTHORACIC ECHOCARDIOGRAM: SHX275

## 2018-10-24 LAB — CBC
HCT: 50.4 % (ref 39.0–52.0)
Hemoglobin: 17.4 g/dL — ABNORMAL HIGH (ref 13.0–17.0)
MCH: 30.7 pg (ref 26.0–34.0)
MCHC: 34.5 g/dL (ref 30.0–36.0)
MCV: 88.9 fL (ref 80.0–100.0)
Platelets: 203 10*3/uL (ref 150–400)
RBC: 5.67 MIL/uL (ref 4.22–5.81)
RDW: 13.1 % (ref 11.5–15.5)
WBC: 13.3 10*3/uL — ABNORMAL HIGH (ref 4.0–10.5)
nRBC: 0 % (ref 0.0–0.2)

## 2018-10-24 LAB — BASIC METABOLIC PANEL
Anion gap: 12 (ref 5–15)
BUN: 6 mg/dL (ref 6–20)
CO2: 22 mmol/L (ref 22–32)
Calcium: 8.7 mg/dL — ABNORMAL LOW (ref 8.9–10.3)
Chloride: 100 mmol/L (ref 98–111)
Creatinine, Ser: 0.74 mg/dL (ref 0.61–1.24)
GFR calc Af Amer: >60 mL/min (ref >60)
GFR calc non Af Amer: >60 mL/min (ref >60)
Glucose, Bld: 127 mg/dL — ABNORMAL HIGH (ref 70–99)
Potassium: 3.9 mmol/L (ref 3.5–5.1)
Sodium: 134 mmol/L — ABNORMAL LOW (ref 135–145)

## 2018-10-24 LAB — LIPID PANEL
Cholesterol: 221 mg/dL — ABNORMAL HIGH (ref 0–200)
HDL: 34 mg/dL — ABNORMAL LOW (ref >40)
LDL Cholesterol: 154 mg/dL — ABNORMAL HIGH (ref 0–99)
Total CHOL/HDL Ratio: 6.5 RATIO
Triglycerides: 164 mg/dL — ABNORMAL HIGH (ref <150)
VLDL: 33 mg/dL (ref 0–40)

## 2018-10-24 LAB — HEPARIN LEVEL (UNFRACTIONATED): Heparin Unfractionated: 0.35 IU/mL (ref 0.30–0.70)

## 2018-10-24 LAB — ECHOCARDIOGRAM COMPLETE
Height: 74 in
Weight: 4342.18 oz

## 2018-10-24 LAB — HIV ANTIBODY (ROUTINE TESTING W REFLEX): HIV Screen 4th Generation wRfx: NONREACTIVE

## 2018-10-24 LAB — HEMOGLOBIN A1C
Hgb A1c MFr Bld: 5.9 % — ABNORMAL HIGH (ref 4.8–5.6)
Mean Plasma Glucose: 122.63 mg/dL

## 2018-10-24 SURGERY — LEFT HEART CATH AND CORONARY ANGIOGRAPHY
Anesthesia: LOCAL

## 2018-10-24 MED ORDER — HEPARIN SODIUM (PORCINE) 1000 UNIT/ML IJ SOLN
INTRAMUSCULAR | Status: DC | PRN
  Administered 2018-10-24: 6000 [IU] via INTRAVENOUS

## 2018-10-24 MED ORDER — VERAPAMIL HCL 2.5 MG/ML IV SOLN
INTRAVENOUS | Status: DC | PRN
  Administered 2018-10-24: 10 mL via INTRA_ARTERIAL

## 2018-10-24 MED ORDER — ASPIRIN 81 MG PO CHEW
81.0000 mg | CHEWABLE_TABLET | Freq: Every day | ORAL | Status: DC
  Administered 2018-10-24 – 2018-10-25 (×2): 81 mg via ORAL
  Filled 2018-10-24 (×2): qty 1

## 2018-10-24 MED ORDER — FENTANYL CITRATE (PF) 100 MCG/2ML IJ SOLN
INTRAMUSCULAR | Status: AC
  Filled 2018-10-24: qty 2

## 2018-10-24 MED ORDER — HEPARIN (PORCINE) IN NACL 1000-0.9 UT/500ML-% IV SOLN
INTRAVENOUS | Status: DC | PRN
  Administered 2018-10-24 (×2): 500 mL

## 2018-10-24 MED ORDER — IOHEXOL 350 MG/ML SOLN
INTRAVENOUS | Status: DC | PRN
  Administered 2018-10-24: 110 mL via INTRACARDIAC

## 2018-10-24 MED ORDER — SODIUM CHLORIDE 0.9% FLUSH: 3.0000 mL | INTRAVENOUS | Status: DC | PRN

## 2018-10-24 MED ORDER — MIDAZOLAM HCL 2 MG/2ML IJ SOLN
INTRAMUSCULAR | Status: AC
  Filled 2018-10-24: qty 2

## 2018-10-24 MED ORDER — SODIUM CHLORIDE 0.9 % IV SOLN: 250.0000 mL | INTRAVENOUS | Status: DC | PRN

## 2018-10-24 MED ORDER — VERAPAMIL HCL 2.5 MG/ML IV SOLN
INTRAVENOUS | Status: AC
  Filled 2018-10-24: qty 2

## 2018-10-24 MED ORDER — HYDRALAZINE HCL 20 MG/ML IJ SOLN: 10.0000 mg | INTRAMUSCULAR | Status: AC | PRN

## 2018-10-24 MED ORDER — ATORVASTATIN CALCIUM 80 MG PO TABS
80.0000 mg | ORAL_TABLET | Freq: Every day | ORAL | Status: DC
  Administered 2018-10-24: 80 mg via ORAL
  Filled 2018-10-24: qty 1

## 2018-10-24 MED ORDER — CLOPIDOGREL BISULFATE 75 MG PO TABS
75.0000 mg | ORAL_TABLET | Freq: Every day | ORAL | Status: DC
  Administered 2018-10-25: 75 mg via ORAL
  Filled 2018-10-24: qty 1

## 2018-10-24 MED ORDER — HEPARIN (PORCINE) IN NACL 1000-0.9 UT/500ML-% IV SOLN
INTRAVENOUS | Status: AC
  Filled 2018-10-24: qty 1000

## 2018-10-24 MED ORDER — LABETALOL HCL 5 MG/ML IV SOLN: 10.0000 mg | INTRAVENOUS | Status: AC | PRN

## 2018-10-24 MED ORDER — FENTANYL CITRATE (PF) 100 MCG/2ML IJ SOLN
INTRAMUSCULAR | Status: DC | PRN
  Administered 2018-10-24: 50 ug via INTRAVENOUS
  Administered 2018-10-24: 25 ug via INTRAVENOUS

## 2018-10-24 MED ORDER — LIDOCAINE HCL (PF) 1 % IJ SOLN
INTRAMUSCULAR | Status: DC | PRN
  Administered 2018-10-24: 2 mL

## 2018-10-24 MED ORDER — HEPARIN SODIUM (PORCINE) 5000 UNIT/ML IJ SOLN
5000.0000 [IU] | Freq: Three times a day (TID) | INTRAMUSCULAR | Status: DC
  Administered 2018-10-25: 5000 [IU] via SUBCUTANEOUS
  Filled 2018-10-24: qty 1

## 2018-10-24 MED ORDER — SODIUM CHLORIDE 0.9 % IV SOLN: INTRAVENOUS | Status: DC

## 2018-10-24 MED ORDER — LIDOCAINE HCL (PF) 1 % IJ SOLN
INTRAMUSCULAR | Status: AC
  Filled 2018-10-24: qty 30

## 2018-10-24 MED ORDER — HEPARIN SODIUM (PORCINE) 1000 UNIT/ML IJ SOLN
INTRAMUSCULAR | Status: AC
  Filled 2018-10-24: qty 1

## 2018-10-24 MED ORDER — ONDANSETRON HCL 4 MG/2ML IJ SOLN: 4.0000 mg | Freq: Four times a day (QID) | INTRAMUSCULAR | Status: DC | PRN

## 2018-10-24 MED ORDER — SODIUM CHLORIDE 0.9% FLUSH
3.0000 mL | Freq: Two times a day (BID) | INTRAVENOUS | Status: DC
  Administered 2018-10-24 – 2018-10-25 (×3): 3 mL via INTRAVENOUS

## 2018-10-24 MED ORDER — MIDAZOLAM HCL 2 MG/2ML IJ SOLN
INTRAMUSCULAR | Status: DC | PRN
  Administered 2018-10-24: 2 mg via INTRAVENOUS

## 2018-10-24 MED ORDER — ACETAMINOPHEN 325 MG PO TABS: 650.0000 mg | ORAL_TABLET | ORAL | Status: DC | PRN

## 2018-10-24 SURGICAL SUPPLY — 13 items
CATH 5FR JL3.5 JR4 ANG PIG MP (CATHETERS) ×1 IMPLANT
CATH INFINITI 5FR JK (CATHETERS) ×1 IMPLANT
CATH INFINITI 5FR JL4 (CATHETERS) ×1 IMPLANT
CATH VISTA GUIDE 6FR XBLAD3.5 (CATHETERS) ×1 IMPLANT
DEVICE RAD COMP TR BAND LRG (VASCULAR PRODUCTS) ×1 IMPLANT
ELECT DEFIB PAD ADLT CADENCE (PAD) ×1 IMPLANT
GUIDEWIRE INQWIRE 1.5J.035X260 (WIRE) IMPLANT
INQWIRE 1.5J .035X260CM (WIRE) ×2
KIT HEART LEFT (KITS) ×2 IMPLANT
PACK CARDIAC CATHETERIZATION (CUSTOM PROCEDURE TRAY) ×2 IMPLANT
SHEATH RAIN RADIAL 21G 6FR (SHEATH) ×1 IMPLANT
TRANSDUCER W/STOPCOCK (MISCELLANEOUS) ×2 IMPLANT
TUBING CIL FLEX 10 FLL-RA (TUBING) ×2 IMPLANT

## 2018-10-24 NOTE — H&P (View-Only) (Signed)
Subjective:  Denies SSCP, palpitations or Dyspnea For cath this am   Objective:  Vitals:   10/23/18 2345 10/24/18 0359 10/24/18 0606 10/24/18 0732  BP: (!) 141/94 (!) 142/97 (!) 149/110   Pulse: 86  94 90  Resp: 15 17    Temp:  98 F (36.7 C)  98 F (36.7 C)  TempSrc:  Oral  Oral  SpO2: 98% 98%  98%  Weight:  123.1 kg    Height:        Intake/Output from previous day:  Intake/Output Summary (Last 24 hours) at 10/24/2018 0747 Last data filed at 10/24/2018 0400 Gross per 24 hour  Intake 536.62 ml  Output 750 ml  Net -213.38 ml    Physical Exam:  Affect appropriate Healthy:  appears stated age HEENT: normal Neck supple with no adenopathy JVP normal no bruits no thyromegaly Lungs clear with no wheezing and good diaphragmatic motion Heart:  S1/S2 no murmur, no rub, gallop or click PMI normal Abdomen: benighn, BS positve, no tenderness, no AAA no bruit.  No HSM or HJR Distal pulses intact with no bruits No edema Neuro non-focal Skin warm and dry No muscular weakness   Lab Results: Basic Metabolic Panel: Recent Labs    10/23/18 1219 10/24/18 0327  NA 135 134*  K 4.1 3.9  CL 101 100  CO2 21* 22  GLUCOSE 167* 127*  BUN 7 6  CREATININE 0.77 0.74  CALCIUM 9.3 8.7*   Liver Function Tests: No results for input(s): AST, ALT, ALKPHOS, BILITOT, PROT, ALBUMIN in the last 72 hours. No results for input(s): LIPASE, AMYLASE in the last 72 hours. CBC: Recent Labs    10/23/18 1219 10/24/18 0327  WBC 15.1* 13.3*  HGB 17.8* 17.4*  HCT 52.9* 50.4  MCV 90.1 88.9  PLT 241 203   Cardiac Enzymes: No results for input(s): CKTOTAL, CKMB, CKMBINDEX, TROPONINI in the last 72 hours. BNP: Invalid input(s): POCBNP D-Dimer: No results for input(s): DDIMER in the last 72 hours. Hemoglobin A1C: Recent Labs    10/24/18 0327  HGBA1C 5.9*   Fasting Lipid Panel: Recent Labs    10/24/18 0327  CHOL 221*  HDL 34*  LDLCALC 154*  TRIG 164*  CHOLHDL 6.5   Thyroid  Function Tests: No results for input(s): TSH, T4TOTAL, T3FREE, THYROIDAB in the last 72 hours.  Invalid input(s): FREET3 Anemia Panel: No results for input(s): VITAMINB12, FOLATE, FERRITIN, TIBC, IRON, RETICCTPCT in the last 72 hours.  Imaging: Dg Chest 2 View  Result Date: 10/23/2018 CLINICAL DATA:  Chest pain EXAM: CHEST - 2 VIEW COMPARISON:  Jul 31, 2016 FINDINGS: No edema or consolidation. Heart is borderline enlarged with pulmonary vascularity normal. No adenopathy. No pneumothorax. No bone lesions. IMPRESSION: Borderline cardiac enlargement.  No edema or consolidation. Electronically Signed   By: Lowella Grip III M.D.   On: 10/23/2018 13:07    Cardiac Studies:  ECG: SR LAE no acute ST changes    Telemetry:  NSR 10/24/2018   Echo:   Medications:   . aspirin EC  81 mg Oral Daily  . atorvastatin  80 mg Oral q1800  . carvedilol  3.125 mg Oral BID WC  . ezetimibe  10 mg Oral Daily  . sodium chloride flush  3 mL Intravenous Once  . sodium chloride flush  3 mL Intravenous Q12H     . sodium chloride    . sodium chloride    . heparin 1,850 Units/hr (10/24/18 0214)  . nitroGLYCERIN 5 mcg/min (  10/23/18 2302)    Assessment/Plan:   Reginald Fernandez is a 38 y.o. male with PMH of CAD with multiple stents (Lcx/OM1, and RCA), HTN, HL and obesity who presented with chest pain. Found to have an elevated HsT.  SEMI:  On heparin and nitro cath today continue ASA and beta blocker HLD:  On statin and zetia stressed compliance   Cath delayed yesterday due to SARS testing   [name] 10/24/2018, 7:47 AM

## 2018-10-24 NOTE — Interval H&P Note (Signed)
Cath Lab Visit (complete for each Cath Lab visit)  Clinical Evaluation Leading to the Procedure:   ACS: Yes.    Non-ACS:    Anginal Classification: CCS IV  Anti-ischemic medical therapy: Maximal Therapy (2 or more classes of medications)  Non-Invasive Test Results: No non-invasive testing performed  Prior CABG: No previous CABG      History and Physical Interval Note:  10/24/2018 10:21 AM  Reginald Fernandez  has presented today for surgery, with the diagnosis of nonstemi.  The various methods of treatment have been discussed with the patient and family. After consideration of risks, benefits and other options for treatment, the patient has consented to  Procedure(s): LEFT HEART CATH AND CORONARY ANGIOGRAPHY (N/A) as a surgical intervention.  The patient's history has been reviewed, patient examined, no change in status, stable for surgery.  I have reviewed the patient's chart and labs.  Questions were answered to the patient's satisfaction.     Shelva Majestic

## 2018-10-24 NOTE — Progress Notes (Signed)
  Echocardiogram 2D Echocardiogram has been performed.  Reginald Fernandez M 10/24/2018, 8:54 AM

## 2018-10-24 NOTE — Progress Notes (Signed)
Subjective:  Denies SSCP, palpitations or Dyspnea For cath this am   Objective:  Vitals:   10/23/18 2345 10/24/18 0359 10/24/18 0606 10/24/18 0732  BP: (!) 141/94 (!) 142/97 (!) 149/110   Pulse: 86  94 90  Resp: 15 17    Temp:  98 F (36.7 C)  98 F (36.7 C)  TempSrc:  Oral  Oral  SpO2: 98% 98%  98%  Weight:  123.1 kg    Height:        Intake/Output from previous day:  Intake/Output Summary (Last 24 hours) at 10/24/2018 0747 Last data filed at 10/24/2018 0400 Gross per 24 hour  Intake 536.62 ml  Output 750 ml  Net -213.38 ml    Physical Exam:  Affect appropriate Healthy:  appears stated age HEENT: normal Neck supple with no adenopathy JVP normal no bruits no thyromegaly Lungs clear with no wheezing and good diaphragmatic motion Heart:  S1/S2 no murmur, no rub, gallop or click PMI normal Abdomen: benighn, BS positve, no tenderness, no AAA no bruit.  No HSM or HJR Distal pulses intact with no bruits No edema Neuro non-focal Skin warm and dry No muscular weakness   Lab Results: Basic Metabolic Panel: Recent Labs    10/23/18 1219 10/24/18 0327  NA 135 134*  K 4.1 3.9  CL 101 100  CO2 21* 22  GLUCOSE 167* 127*  BUN 7 6  CREATININE 0.77 0.74  CALCIUM 9.3 8.7*   Liver Function Tests: No results for input(s): AST, ALT, ALKPHOS, BILITOT, PROT, ALBUMIN in the last 72 hours. No results for input(s): LIPASE, AMYLASE in the last 72 hours. CBC: Recent Labs    10/23/18 1219 10/24/18 0327  WBC 15.1* 13.3*  HGB 17.8* 17.4*  HCT 52.9* 50.4  MCV 90.1 88.9  PLT 241 203   Cardiac Enzymes: No results for input(s): CKTOTAL, CKMB, CKMBINDEX, TROPONINI in the last 72 hours. BNP: Invalid input(s): POCBNP D-Dimer: No results for input(s): DDIMER in the last 72 hours. Hemoglobin A1C: Recent Labs    10/24/18 0327  HGBA1C 5.9*   Fasting Lipid Panel: Recent Labs    10/24/18 0327  CHOL 221*  HDL 34*  LDLCALC 154*  TRIG 164*  CHOLHDL 6.5   Thyroid  Function Tests: No results for input(s): TSH, T4TOTAL, T3FREE, THYROIDAB in the last 72 hours.  Invalid input(s): FREET3 Anemia Panel: No results for input(s): VITAMINB12, FOLATE, FERRITIN, TIBC, IRON, RETICCTPCT in the last 72 hours.  Imaging: Dg Chest 2 View  Result Date: 10/23/2018 CLINICAL DATA:  Chest pain EXAM: CHEST - 2 VIEW COMPARISON:  Jul 31, 2016 FINDINGS: No edema or consolidation. Heart is borderline enlarged with pulmonary vascularity normal. No adenopathy. No pneumothorax. No bone lesions. IMPRESSION: Borderline cardiac enlargement.  No edema or consolidation. Electronically Signed   By: Lowella Grip III M.D.   On: 10/23/2018 13:07    Cardiac Studies:  ECG: SR LAE no acute ST changes    Telemetry:  NSR 10/24/2018   Echo:   Medications:   . aspirin EC  81 mg Oral Daily  . atorvastatin  80 mg Oral q1800  . carvedilol  3.125 mg Oral BID WC  . ezetimibe  10 mg Oral Daily  . sodium chloride flush  3 mL Intravenous Once  . sodium chloride flush  3 mL Intravenous Q12H     . sodium chloride    . sodium chloride    . heparin 1,850 Units/hr (10/24/18 0214)  . nitroGLYCERIN 5 mcg/min (  10/23/18 2302)    Assessment/Plan:   Reginald Fernandez is a 38 y.o. male with PMH of CAD with multiple stents (Lcx/OM1, and RCA), HTN, HL and obesity who presented with chest pain. Found to have an elevated HsT.  SEMI:  On heparin and nitro cath today continue ASA and beta blocker HLD:  On statin and zetia stressed compliance   Cath delayed yesterday due to SARS testing     10/24/2018, 7:47 AM    

## 2018-10-24 NOTE — Progress Notes (Signed)
ANTICOAGULATION CONSULT NOTE - Follow Up Consult  Pharmacy Consult for heparin Indication: chest pain/ACS  Labs: Recent Labs    10/23/18 1219 10/23/18 1411 10/23/18 1812 10/23/18 2110 10/24/18 0327  HGB 17.8*  --   --   --  17.4*  HCT 52.9*  --   --   --  50.4  PLT 241  --   --   --  203  HEPARINUNFRC  --   --   --  0.17* 0.35  CREATININE 0.77  --   --   --   --   TROPONINIHS 1,280* 2,085* 5,347* 9,501*  --     Assessment/Plan:  38yo male therapeutic on heparin after rate change. Will continue gtt at current rate and confirm stable with additional level.   Wynona Neat, PharmD, BCPS  10/24/2018,4:10 AM

## 2018-10-24 NOTE — Progress Notes (Signed)
Pt ambulated in a hallway after his bed rest over, tolerated well, vitals stable, wife is in bed side and is updated, Nitro IV  continue @5mcg /min  Palma Holter, Therapist, sports

## 2018-10-24 NOTE — Progress Notes (Signed)
This RN verified with [phone] RN that the patient has been NPO since midnight.

## 2018-10-25 ENCOUNTER — Encounter (HOSPITAL_COMMUNITY): Payer: Self-pay

## 2018-10-25 ENCOUNTER — Other Ambulatory Visit: Payer: Self-pay | Admitting: Physician Assistant

## 2018-10-25 DIAGNOSIS — I5042 Chronic combined systolic (congestive) and diastolic (congestive) heart failure: Secondary | ICD-10-CM

## 2018-10-25 DIAGNOSIS — R7303 Prediabetes: Secondary | ICD-10-CM

## 2018-10-25 LAB — CBC
HCT: 50.2 % (ref 39.0–52.0)
Hemoglobin: 17.2 g/dL — ABNORMAL HIGH (ref 13.0–17.0)
MCH: 30.7 pg (ref 26.0–34.0)
MCHC: 34.3 g/dL (ref 30.0–36.0)
MCV: 89.5 fL (ref 80.0–100.0)
Platelets: 196 10*3/uL (ref 150–400)
RBC: 5.61 MIL/uL (ref 4.22–5.81)
RDW: 12.9 % (ref 11.5–15.5)
WBC: 9.7 10*3/uL (ref 4.0–10.5)
nRBC: 0 % (ref 0.0–0.2)

## 2018-10-25 LAB — BASIC METABOLIC PANEL
Anion gap: 12 (ref 5–15)
BUN: 7 mg/dL (ref 6–20)
CO2: 24 mmol/L (ref 22–32)
Calcium: 8.8 mg/dL — ABNORMAL LOW (ref 8.9–10.3)
Chloride: 101 mmol/L (ref 98–111)
Creatinine, Ser: 0.8 mg/dL (ref 0.61–1.24)
GFR calc Af Amer: >60 mL/min (ref >60)
GFR calc non Af Amer: >60 mL/min (ref >60)
Glucose, Bld: 108 mg/dL — ABNORMAL HIGH (ref 70–99)
Potassium: 3.9 mmol/L (ref 3.5–5.1)
Sodium: 137 mmol/L (ref 135–145)

## 2018-10-25 LAB — MAGNESIUM: Magnesium: 2 mg/dL (ref 1.7–2.4)

## 2018-10-25 MED ORDER — EZETIMIBE 10 MG PO TABS: 10.0000 mg | ORAL_TABLET | Freq: Every day | ORAL | 1 refills | Status: DC

## 2018-10-25 MED ORDER — SPIRONOLACTONE 25 MG PO TABS: 25.0000 mg | ORAL_TABLET | Freq: Every day | ORAL | 1 refills | Status: DC

## 2018-10-25 MED ORDER — SPIRONOLACTONE 25 MG PO TABS
25.0000 mg | ORAL_TABLET | Freq: Every day | ORAL | Status: DC
  Administered 2018-10-25: 25 mg via ORAL
  Filled 2018-10-25: qty 1

## 2018-10-25 MED ORDER — EZETIMIBE 10 MG PO TABS: 10.0000 mg | ORAL_TABLET | Freq: Every day | ORAL | 3 refills | Status: DC

## 2018-10-25 MED ORDER — CLOPIDOGREL BISULFATE 75 MG PO TABS: 75.0000 mg | ORAL_TABLET | Freq: Every day | ORAL | 0 refills | Status: DC

## 2018-10-25 MED ORDER — CLOPIDOGREL BISULFATE 75 MG PO TABS: 75.0000 mg | ORAL_TABLET | Freq: Every day | ORAL | 11 refills | Status: DC

## 2018-10-25 MED ORDER — CLOPIDOGREL BISULFATE 75 MG PO TABS: 75.0000 mg | ORAL_TABLET | Freq: Every day | ORAL | 1 refills | Status: DC

## 2018-10-25 MED ORDER — NITROGLYCERIN 0.4 MG SL SUBL: 0.4000 mg | SUBLINGUAL_TABLET | SUBLINGUAL | 1 refills | Status: DC | PRN

## 2018-10-25 MED ORDER — SACUBITRIL-VALSARTAN 24-26 MG PO TABS
1.0000 | ORAL_TABLET | Freq: Two times a day (BID) | ORAL | Status: DC
  Administered 2018-10-25: 1 via ORAL
  Filled 2018-10-25: qty 1

## 2018-10-25 MED ORDER — CARVEDILOL 3.125 MG PO TABS: 3.1250 mg | ORAL_TABLET | Freq: Two times a day (BID) | ORAL | 5 refills | Status: DC

## 2018-10-25 MED ORDER — SACUBITRIL-VALSARTAN 24-26 MG PO TABS: 1.0000 | ORAL_TABLET | Freq: Two times a day (BID) | ORAL | 11 refills | Status: DC

## 2018-10-25 MED ORDER — SACUBITRIL-VALSARTAN 24-26 MG PO TABS: 1.0000 | ORAL_TABLET | Freq: Two times a day (BID) | ORAL | 0 refills | Status: DC

## 2018-10-25 MED ORDER — ATORVASTATIN CALCIUM 80 MG PO TABS: 80.0000 mg | ORAL_TABLET | Freq: Every day | ORAL | 3 refills | Status: DC

## 2018-10-25 MED ORDER — ATORVASTATIN CALCIUM 80 MG PO TABS: 80.0000 mg | ORAL_TABLET | Freq: Every day | ORAL | 1 refills | Status: DC

## 2018-10-25 MED ORDER — SACUBITRIL-VALSARTAN 24-26 MG PO TABS: 1.0000 | ORAL_TABLET | Freq: Two times a day (BID) | ORAL | 3 refills | Status: DC

## 2018-10-25 MED ORDER — NITROGLYCERIN 0.4 MG SL SUBL: 0.4000 mg | SUBLINGUAL_TABLET | SUBLINGUAL | 3 refills | Status: DC | PRN

## 2018-10-25 MED FILL — NITROGLYCERIN 0.4 MG TAB SL: 0.4 | 7 days supply | Qty: 25 | Fill #0

## 2018-10-25 MED FILL — EZETIMIBE 10 MG TABS: 10 | 90 days supply | Qty: 90 | Fill #0

## 2018-10-25 MED FILL — ATORVASTATIN 80 MG TABLET: 80 | 90 days supply | Qty: 90 | Fill #0

## 2018-10-25 MED FILL — CARVEDILOL 3.125 MG TABLET: 3.125 | 30 days supply | Qty: 60 | Fill #0

## 2018-10-25 MED FILL — CLOPIDOGREL 75 MG TABLET: 75 | 90 days supply | Qty: 90 | Fill #0

## 2018-10-25 MED FILL — ENTRESTO 24 MG-26 MG TABLET: 24-26 | 30 days supply | Qty: 60 | Fill #0

## 2018-10-25 MED FILL — SPIRONOLACTONE 25 MG TABS: 25 | 90 days supply | Qty: 90 | Fill #0

## 2018-10-25 NOTE — Progress Notes (Signed)
Pt had 6 beat run vt  md made aware.   Pt asymptomatic.  Will continue to monitor. Saunders Revel T

## 2018-10-25 NOTE — Plan of Care (Signed)
  Problem: Education: Goal: Understanding of CV disease, CV risk reduction, and recovery process will improve Outcome: Completed/Met Goal: Individualized Educational Video(s) Outcome: Completed/Met   Problem: Activity: Goal: Ability to return to baseline activity level will improve Outcome: Completed/Met   Problem: Cardiovascular: Goal: Ability to achieve and maintain adequate cardiovascular perfusion will improve Outcome: Completed/Met Goal: Vascular access site(s) Level 0-1 will be maintained Outcome: Completed/Met   Problem: Health Behavior/Discharge Planning: Goal: Ability to safely manage health-related needs after discharge will improve Outcome: Completed/Met   Problem: Education: Goal: Knowledge of General Education information will improve Description: Including pain rating scale, medication(s)/side effects and non-pharmacologic comfort measures Outcome: Completed/Met   Problem: Health Behavior/Discharge Planning: Goal: Ability to manage health-related needs will improve Outcome: Completed/Met   Problem: Clinical Measurements: Goal: Ability to maintain clinical measurements within normal limits will improve Outcome: Completed/Met Goal: Will remain free from infection Outcome: Completed/Met Goal: Diagnostic test results will improve Outcome: Completed/Met Goal: Respiratory complications will improve Outcome: Completed/Met Goal: Cardiovascular complication will be avoided Outcome: Completed/Met   Problem: Activity: Goal: Risk for activity intolerance will decrease Outcome: Completed/Met   Problem: Nutrition: Goal: Adequate nutrition will be maintained Outcome: Completed/Met   Problem: Coping: Goal: Level of anxiety will decrease Outcome: Completed/Met   Problem: Elimination: Goal: Will not experience complications related to bowel motility Outcome: Completed/Met Goal: Will not experience complications related to urinary retention Outcome: Completed/Met   Problem: Pain Managment: Goal: General experience of comfort will improve Outcome: Completed/Met   Problem: Safety: Goal: Ability to remain free from injury will improve Outcome: Completed/Met   Problem: Skin Integrity: Goal: Risk for impaired skin integrity will decrease Outcome: Completed/Met

## 2018-10-25 NOTE — Discharge Summary (Signed)
Discharge Summary    Patient ID: Reginald Fernandez Luebbe MRN: [id number]; DOB: Jun 18, 1980  Admit date: 10/23/2018 Discharge date: 10/25/2018  Primary Care Provider: Patient, No Pcp Per  Primary Cardiologist: Bryan Lemmaavid Harding, MD  Primary Electrophysiologist:  None   Discharge Diagnoses    Principal Problem:   Non-STEMI (non-ST elevated myocardial infarction) [facility](HCC) Active Problems:   Essential hypertension   Hyperlipidemia LDL goal <70   CAD S/P percutaneous coronary angioplasty: DES PCI -Cx-OM1, RCA x 2   Dilated cardiomyopathy (HCC)   Prediabetes   Allergies Allergies  Allergen Reactions   Penicillins Other (See Comments)    Severe due to asthma Did it involve swelling of the face/tongue/throat, SOB, or low BP? No Did it involve sudden or severe rash/hives, skin peeling, or any reaction on the inside of your mouth or nose? No Did you need to seek medical attention at a hospital or doctor's office? No When did it last happen?child If all above answers are "NO", may proceed with cephalosporin use.    Diagnostic Studies/Procedures    Echo 10/24/2018 IMPRESSIONS    1. The left ventricle has severely reduced systolic function, with an ejection fraction of 20-25%. The cavity size was mild to moderately dilated. Left ventricular diastolic Doppler parameters are consistent with pseudonormalization. Left ventricular  diffuse hypokinesis.  2. Left atrial size was mildly dilated.  3. Right atrial size was mildly dilated.  4. Trivial pericardial effusion is present.  5. No evidence of mitral valve stenosis. Trivial mitral regurgitation.  6. The aortic valve is tricuspid. No stenosis of the aortic valve.  7. The aorta is normal in size and structure.  8. The IVC was normal in size. No complete TR doppler jet so unable to estimate PA systolic pressure.  9. Normal RV size with mildly decreased systolic function.  Cath 10/24/2018  Prox RCA-1 lesion is 30% stenosed.  Prox RCA-2 lesion  is 30% stenosed.  Previously placed Prox RCA to Mid RCA stent (unknown type) is widely patent.  Previously placed Dist RCA stent (unknown type) is widely patent.  Previously placed 1st Mrg stent (unknown type) is widely patent.  Previously placed Prox Cx stent (unknown type) is widely patent.  Prox Cx to Mid Cx lesion is 55% stenosed.  1st Diag lesion is 60% stenosed.   Multivessel CAD with mild luminal irregularity of the LAD and evidence for 60% narrowing in a very small first diagonal vessel.  The second diagonal vessel bifurcates in its mid segment and there appears to be total occlusion of the inferior limb after the bifurcation.  (This was not able to be depicted on the diagram); the stent in the circumflex vessel which extends into the OM-1 vessel was widely patent and there is ostial pinching of the AV groove circumflex with narrowing of 50 to 60%; the RCA has luminal irregularity with 30% proximal and proximal to mid stenoses.  The mid RCA stent and distal RCA stent is widely patent.  LVEDP 18 mmHg  RECOMMENDATION: Resumption of medical therapy.  Recommend DAPT in this patient with multiple stents and new occlusion of the inferior limb branch of the second diagonal vessel.  The patient has previously documented reduced LV function with an EF of 35% (results of today echo is still pending).  He will need medical therapy for his cardiomyopathy with  consideration for Entresto, beta-blocker therapy and possible aldosterone blockade.  Recommend aggressive lipid-lowering therapy with target LDL less than 70.  _____________   History of Present Illness  Reginald Fernandez Piscitelli is a 38 y.o. male with PMH of CAD with multiple stents (Lcx/OM1, and RCA), HTN, HL and obesity who presented with chest pain. Found to have an elevated HsT. He presented with inferior STEMI in 2011 with a cath at that time showing nonobstructive disease.  Re-presented in 2015 and found to have severe disease of the left  circumflex/OM and RCA.  These were both treated with drug-eluting stents.  Was followed in the office for a brief period of time afterwards.  Presented again back in May 2018 after playing in a kickball game and developed chest pain shortly afterwards with him and his wife were at [employer].  During that admission he was found to have a non-STEMI and transferred to [facility] evaluation.  Troponin peaked at 0.7.  Underwent left heart cath with Dr. Excell Seltzerooper showing two-vessel disease but with patent stents in the RCA and OM vessel of left circumflex with normal LVEDP.  It was unclear what his culprit was for troponin elevation at that time.  He was placed on Plavix post cath.  Echocardiogram that admission showed an EF of 35 to 40% with basal anterior lateral hypokinesis and mild apical akinesis with grade 2 diastolic dysfunction.  His meds were adjusted added on spironolactone.  He felt well post cath and was planned to follow-up in the office with a repeat echocardiogram in 6 weeks to assess LV function.  Appears he was lost to follow-up.  States he has not had any significant issues leading up to this admission.  Unfortunately he has not been on any home medications for at least 6 months.  States he had issues with his insurance and never resumed or refilled any of his medications.  He denies tobacco use, but does report using marijuana 3 days ago.  States last evening he was in his usual state of health.  He coughed and developed left-sided sharp chest pain.  States this persisted throughout the evening.  He did take 4 baby aspirin which improved his symptoms but did not fully resolve them.  Was actually evaluated by EMS last evening but told his EKG showed no reported change.  Therefore he did not present to the ED.  This morning he woke up with residual pain and presented to the ED for further evaluation.  In the ED his labs showed stable electrolytes, WBC 15.1, hemoglobin 17.8, high-sensitivity  troponin 1280.  Chest x-ray showed cardiac enlargement but no edema.  EKG showed sinus rhythm..  He was given 2 sublingual nitroglycerin while in the ED which did improve his chest discomfort but still present at the time of exam.   Hospital Course     Consultants: N/A  Patient was admitted to [facility].  Initial high-sensitivity troponin was elevated at 1280, this eventually trended up to 9501.  COVID-19 test was negative.  Hemoglobin A1c was borderline elevated at 5.9.  Echocardiogram performed on 10/24/2018 shows EF of 20 to 25%, mild LAE, otherwise no significant valve issue.  Note, patient had a previous echocardiogram in 2018 that showed EF 35 to 40% the time.  Given an STEMI and worsening LV function, patient underwent cardiac catheterization on 10/24/2018 which revealed 30% proximal RCA lesion, widely patent proximal to mid RCA stent, widely patent OM1 stent, patent proximal left circumflex stent, 55% proximal to mid left circumflex lesion and 60% D1 lesion.  Medical therapy was recommended in this patient.  Note, there does seem to be a new inferior limp  occlusion of the second diagonal vessel, however this vessel is not amenable to PCI.  Patient was seen in the morning of 10/25/2018 at which time he denied any significant chest discomfort or shortness of breath.  He is deemed stable for discharge from cardiology perspective.  Heart failure medication has been uptitrated.  He was started on low-dose Entresto during this hospitalization.  He will continue on spironolactone 25 mg at home along with carvedilol.  He will need BNP and basic metabolic panel in 2 weeks per Dr. Eden Emms.  I will arrange a 2 weeks cardiology follow-up and 4 to 6 weeks follow-up with Dr. Herbie Baltimore.  He will require a repeat echocardiogram in 3 months, if EF remains low less than 35%, he will need to be referred to EP for consideration of AICD implantation for primary prevention.  _____________  Discharge Vitals Blood  pressure (!) 132/97, pulse 84, temperature 99 F (37.2 C), temperature source Oral, resp. rate 12, height 6\' 2"  (1.88 m), weight 123.1 kg, SpO2 98 %.  Filed Weights   10/23/18 1346 10/23/18 1632 10/24/18 0359  Weight: 127 kg 124 kg 123.1 kg    Labs & Radiologic Studies    CBC Recent Labs    10/24/18 0327 10/25/18 0207  WBC 13.3* 9.7  HGB 17.4* 17.2*  HCT 50.4 50.2  MCV 88.9 89.5  PLT 203 196   Basic Metabolic Panel Recent Labs    86/57/84 0327 10/25/18 0207  NA 134* 137  K 3.9 3.9  CL 100 101  CO2 22 24  GLUCOSE 127* 108*  BUN 6 7  CREATININE 0.74 0.80  CALCIUM 8.7* 8.8*  MG  --  2.0   Liver Function Tests No results for input(s): AST, ALT, ALKPHOS, BILITOT, PROT, ALBUMIN in the last 72 hours. No results for input(s): LIPASE, AMYLASE in the last 72 hours. Cardiac Enzymes No results for input(s): CKTOTAL, CKMB, CKMBINDEX, TROPONINI in the last 72 hours. BNP Invalid input(s): POCBNP D-Dimer No results for input(s): DDIMER in the last 72 hours. Hemoglobin A1C Recent Labs    10/24/18 0327  HGBA1C 5.9*   Fasting Lipid Panel Recent Labs    10/24/18 0327  CHOL 221*  HDL 34*  LDLCALC 154*  TRIG 164*  CHOLHDL 6.5   Thyroid Function Tests No results for input(s): TSH, T4TOTAL, T3FREE, THYROIDAB in the last 72 hours.  Invalid input(s): FREET3 _____________  Dg Chest 2 View  Result Date: 10/23/2018 CLINICAL DATA:  Chest pain EXAM: CHEST - 2 VIEW COMPARISON:  Jul 31, 2016 FINDINGS: No edema or consolidation. Heart is borderline enlarged with pulmonary vascularity normal. No adenopathy. No pneumothorax. No bone lesions. IMPRESSION: Borderline cardiac enlargement.  No edema or consolidation. Electronically Signed   By: Bretta Bang III M.D.   On: 10/23/2018 13:07   Disposition   Pt is being discharged home today in good condition.  Follow-up Plans & Appointments    Follow-up Information    Jodelle Gross, NP Follow up on 11/03/2018.     Specialties: Nurse Practitioner, Radiology, Cardiology Why: 2:45PM. Cardiology. You will need BNP and BMP labwork in our office 2-3 days prior to your visit. You do not need appt for lab, come in between 8AM and 4:30PM (lab closed for lunch between 12:45-2pm) Contact information: [address] [place] [address] [phone]          Discharge Instructions    Diet - low sodium heart healthy   Complete by: As directed  Increase activity slowly   Complete by: As directed       Discharge Medications   Allergies as of 10/25/2018      Reactions   Penicillins Other (See Comments)   Severe due to asthma Did it involve swelling of the face/tongue/throat, SOB, or low BP? No Did it involve sudden or severe rash/hives, skin peeling, or any reaction on the inside of your mouth or nose? No Did you need to seek medical attention at a hospital or doctor's office? No When did it last happen?child If all above answers are "NO", may proceed with cephalosporin use.      Medication List    STOP taking these medications   hydrochlorothiazide 25 MG tablet Commonly known as: HYDRODIURIL   lisinopril 20 MG tablet Commonly known as: ZESTRIL   promethazine 25 MG tablet Commonly known as: PHENERGAN     TAKE these medications   acetaminophen 500 MG tablet Commonly known as: TYLENOL Take 500-1,000 mg by mouth every 6 (six) hours as needed for moderate pain or headache.   aspirin 81 MG tablet Take 81 mg by mouth daily. Notes to patient: 10/26/2018   atorvastatin 80 MG tablet Commonly known as: LIPITOR Take 1 tablet (80 mg total) by mouth daily at 6 PM. NEED OV. Notes to patient: 10/25/2018   carvedilol 3.125 MG tablet Commonly known as: COREG Take 1 tablet (3.125 mg total) by mouth 2 (two) times daily with a meal. What changed:   medication strength  how much to take  additional instructions Notes to patient: 10/25/2018   clopidogrel 75 MG tablet Commonly  known as: PLAVIX Take 1 tablet (75 mg total) by mouth daily.   ezetimibe 10 MG tablet Commonly known as: ZETIA Take 1 tablet (10 mg total) by mouth daily. Notes to patient: 10/26/2018   nitroGLYCERIN 0.4 MG SL tablet Commonly known as: Nitrostat Place 1 tablet (0.4 mg total) under the tongue every 5 (five) minutes as needed for chest pain.   sacubitril-valsartan 24-26 MG Commonly known as: ENTRESTO Take 1 tablet by mouth 2 (two) times daily. Notes to patient: This evening 10/25/2018   spironolactone 25 MG tablet Commonly known as: ALDACTONE Take 1 tablet (25 mg total) by mouth daily. Notes to patient: 10/26/2018        Acute coronary syndrome (MI, NSTEMI, STEMI, etc) this admission?: Yes.     AHA/ACC Clinical Performance & Quality Measures: 1. Aspirin prescribed? - Yes 2. ADP Receptor Inhibitor (Plavix/Clopidogrel, Brilinta/Ticagrelor or Effient/Prasugrel) prescribed (includes medically managed patients)? - Yes 3. Beta Blocker prescribed? - Yes 4. High Intensity Statin (Lipitor 40-80mg  or Crestor 20-40mg ) prescribed? - Yes 5. EF assessed during THIS hospitalization? - Yes 6. For EF <40%, was ACEI/ARB prescribed? - Yes 7. For EF <40%, Aldosterone Antagonist (Spironolactone or Eplerenone) prescribed? - Yes 8. Cardiac Rehab Phase II ordered (Included Medically managed Patients)? - Yes     Outstanding Labs/Studies   BNP and BMP in 2 weeks  Duration of Discharge Encounter   Greater than 30 minutes including physician time.  Hilbert Corrigan, PA 10/25/2018, 11:30 AM

## 2018-10-25 NOTE — Progress Notes (Addendum)
Pt got discharged to home, discharge instructions provided and patient showed understanding to it, IV taken out,Telemonitor DC,pt left unit in wheelchair with all of the belongings accompanied with a family member (wife). Entresto coupons provided via [employer] manager.  Chauncey Fischer

## 2018-10-25 NOTE — Progress Notes (Signed)
Subjective:   Ready to go home   Objective:  Vitals:   10/25/18 0353 10/25/18 0631 10/25/18 0730 10/25/18 0733  BP: 131/89 (!) 137/97 (!) 151/101 (!) 132/97  Pulse: 85 (!) 103 86 84  Resp: 14  12   Temp: 98.9 F (37.2 C)  99 F (37.2 C)   TempSrc: Oral  Oral   SpO2: 98%  98%   Weight:      Height:        Intake/Output from previous day:  Intake/Output Summary (Last 24 hours) at 10/25/2018 1610 Last data filed at 10/25/2018 0800 Gross per 24 hour  Intake 774.13 ml  Output 400 ml  Net 374.13 ml    Physical Exam:  Affect appropriate Healthy:  appears stated age HEENT: normal Neck supple with no adenopathy JVP normal no bruits no thyromegaly Lungs clear with no wheezing and good diaphragmatic motion Heart:  S1/S2 no murmur, no rub, gallop or click PMI normal Abdomen: benighn, BS positve, no tenderness, no AAA no bruit.  No HSM or HJR Distal pulses intact with no bruits No edema Neuro non-focal Skin warm and dry No muscular weakness Right radial cath sight A    Lab Results: Basic Metabolic Panel: Recent Labs    10/24/18 0327 10/25/18 0207  NA 134* 137  K 3.9 3.9  CL 100 101  CO2 22 24  GLUCOSE 127* 108*  BUN 6 7  CREATININE 0.74 0.80  CALCIUM 8.7* 8.8*  MG  --  2.0   CBC: Recent Labs    10/24/18 0327 10/25/18 0207  WBC 13.3* 9.7  HGB 17.4* 17.2*  HCT 50.4 50.2  MCV 88.9 89.5  PLT 203 196   Hemoglobin A1C: Recent Labs    10/24/18 0327  HGBA1C 5.9*   Fasting Lipid Panel: Recent Labs    10/24/18 0327  CHOL 221*  HDL 34*  LDLCALC 154*  TRIG 164*  CHOLHDL 6.5    Imaging: Dg Chest 2 View  Result Date: 10/23/2018 CLINICAL DATA:  Chest pain EXAM: CHEST - 2 VIEW COMPARISON:  Jul 31, 2016 FINDINGS: No edema or consolidation. Heart is borderline enlarged with pulmonary vascularity normal. No adenopathy. No pneumothorax. No bone lesions. IMPRESSION: Borderline cardiac enlargement.  No edema or consolidation. Electronically Signed   By:  Lowella Grip III M.D.   On: 10/23/2018 13:07    Cardiac Studies:  ECG: SR LAE no acute ST changes    Telemetry:  NSR 10/25/2018   Echo: EF 20-25%   Medications:   . aspirin  81 mg Oral Daily  . atorvastatin  80 mg Oral q1800  . carvedilol  3.125 mg Oral BID WC  . clopidogrel  75 mg Oral Q breakfast  . ezetimibe  10 mg Oral Daily  . heparin  5,000 Units Subcutaneous Q8H  . sodium chloride flush  3 mL Intravenous Once  . sodium chloride flush  3 mL Intravenous Q12H  . sodium chloride flush  3 mL Intravenous Q12H     . sodium chloride      Assessment/Plan:   Reginald Fernandez is a 38 y.o. male with PMH of CAD with multiple stents (Lcx/OM1, and RCA), HTN, HL and obesity who presented with chest pain. Found to have an elevated HsT.  SEMI:  Cath 10/24/18 with patent stents and occlusion of small inferior branch of diagonal medical Rx  Continue ASA and coreg   CHF:  Will need outpatient f/u with EP to discuss primary prevention AICD.  Start low dose Entresto  And Aldactone 25 mg Needs BNP and BMET in 2 weeks   TOC f/u PA 2 weeks and Dr Herbie Baltimore 4-6 weeks to optimize meds and refer to EP  [facility] 10/25/2018, 9:39 AM

## 2018-10-25 NOTE — Care Management (Signed)
Patient given Brilinta card.  Scripts sent to [facility] as they are open today until 4:30.  Patient may get refills at [facility].

## 2018-10-31 ENCOUNTER — Other Ambulatory Visit: Payer: Self-pay

## 2018-10-31 ENCOUNTER — Other Ambulatory Visit: Payer: Self-pay | Admitting: Physician Assistant

## 2018-10-31 ENCOUNTER — Telehealth: Payer: Self-pay | Admitting: Cardiology

## 2018-10-31 DIAGNOSIS — I5042 Chronic combined systolic (congestive) and diastolic (congestive) heart failure: Secondary | ICD-10-CM | POA: Diagnosis not present

## 2018-10-31 DIAGNOSIS — R0600 Dyspnea, unspecified: Secondary | ICD-10-CM | POA: Diagnosis not present

## 2018-10-31 NOTE — Telephone Encounter (Signed)
Received a call from [place] with [facility] calling to request recent lab orders Bmet and Bnp to be faxed.Orders faxed to her at fax # [phone].

## 2018-11-01 LAB — BASIC METABOLIC PANEL WITH GFR
BUN: 15 mg/dL (ref 7–25)
CO2: 26 mmol/L (ref 20–32)
Calcium: 9.3 mg/dL (ref 8.6–10.3)
Chloride: 105 mmol/L (ref 98–110)
Creat: 0.67 mg/dL (ref 0.60–1.35)
GFR, Est African American: 141 mL/min/{1.73_m2} (ref >=60)
GFR, Est Non African American: 122 mL/min/{1.73_m2} (ref >=60)
Glucose, Bld: 121 mg/dL — ABNORMAL HIGH (ref 65–99)
Potassium: 4.8 mmol/L (ref 3.5–5.3)
Sodium: 139 mmol/L (ref 135–146)

## 2018-11-01 LAB — BRAIN NATRIURETIC PEPTIDE: Brain Natriuretic Peptide: 37 pg/mL (ref <100)

## 2018-11-02 NOTE — Progress Notes (Signed)
Cardiology Office Note   Date:  11/03/2018   ID:  Reginald Fernandez, DOB 02/14/1981, MRN [id number]  PCP:  Patient, No Pcp Per  Cardiologist: Dr. Herbie BaltimoreHarding  CC: [facility] follow up. CAD, ICM    History of Present Illness: Reginald CordKealton R Petree is a 38 y.o. male who presents for ongoing assessment and management of coronary artery disease, status post non-ST elevation MI on 10/23/2018, status post hospitalization, with chest pain on 10/23/2018.  Reginald Fernandez has a history of coronary artery disease with multiple stents (left circumflex/OM1, and RCA 2015), hyperlipidemia, and obesity.   The patient was readmitted to the hospital in May 2018 after playing a game of kickball and he began to have chest pain.  The patient was admitted with a non-STEMI at that time and underwent left heart catheterization by Dr. Excell Seltzerooper showing two-vessel disease but with patent stents in the RCA and OM vessel, and stent to the left circumflex with normal LVEDP.  He was placed on Plavix.    Echo at the time of that admission revealed an EF of 35% to 40% with basal anterior lateral hypokinesis and mild hypokinesis with grade 2 diastolic dysfunction.  The patient had been lost to follow-up until presentation [facility] room.  It was noted that the patient had had issues with his insurance and had not had any of his medications for a minimum of 6 months.  Echocardiogram was completed during hospitalization on 10/24/2018 which revealed EF of 20% to 25%, with mild LAE otherwise no significant valve disease.  Given the fact that the patient had reduced EF compared to prior echocardiogram in 2018, a cardiac catheterization was completed.  Left heart cath revealed 30% proximal RCA lesion, widely patent proximal to mid RCA stent with widely patent OM1 stent and patent proximal left circumflex stent, and a 60% diagonal 1 lesion.  Medical therapy was recommended.Marland Kitchen.  He was also noted to have a total occlusion of the inferior limb after the  bifurcation of the second diagonal, which was not amendable to PCI.Marland Kitchen.   He was recommended for dual antiplatelet therapy in the setting of multiple stents and new occlusion of the inferior limb branch of the second diagonal vessel. This will be life long therapy..  Ejection fraction was found to be 35%.  He was recommended for treatment of ischemic cardiomyopathy with Entresto and beta-blocker therapy along with aldosterone blockade.  On discharge dated 10/25/2018, he was started on low-dose Entresto 24/26 mg twice daily, spironolactone 25 mg daily with carvedilol 3.125 mg twice daily, with plans to have repeat echocardiogram in 3 months.  If the EF remained diminished, he would be referred to EP for consideration of AICD implantation for primary prevention.  Discharge blood pressure 132/97, discharge weight 123.1 kg (270 lbs).  Reginald Fernandez is without complaints today. He is medically compliant. He denies edema, DOE, chest discomfort.  He states he has noticed that his HR is in the 86-88 bpm range at rest. He walks 2.5 miles a day, does push ups and sit-ups each day.  He states his energy is improved since leaving the hospital.   Past Medical History:  Diagnosis Date   CAD S/P percutaneous coronary angioplasty 11/20/2013   a. 09/2009: EKG with Inf STEMI - no obstructive CAD;  b. 11/2013 Inflat STEMI/PCI: LM nl, LAD 20p, D1 sm - diff dzs, D2 large - nl, LCX 95-99, OM1 100 (3.5x38 Promus Premier DES), RCA 95-3852m, 80d (3.0x20 and 3.0x16 Promus Premier DES');  c. 11/2013 Echo: Ef 55-60%, no rwma.    Essential hypertension    History of echocardiogram    a. 11/2016 Echo: EF 55-60%, no rwma.   Hyperlipidemia with target LDL less than 100    Marijuana abuse    Migraine    Morbid obesity (HCC)    ST-segment elevation myocardial infarction (STEMI) of inferior wall (HCC) 11/20/2013    Past Surgical History:  Procedure Laterality Date   CARDIAC CATHETERIZATION  09/2009;    Mild RCA luminal Irregularities  - MIld INf HK -- Med Rx; b)   LEFT HEART CATH AND CORONARY ANGIOGRAPHY N/A 08/01/2016   Procedure: Left Heart Cath and Coronary Angiography;  Surgeon: Tonny Bollman, MD;  Location: [facility] INVASIVE CV LAB;  Service: Cardiovascular;  Laterality: N/A;   LEFT HEART CATH AND CORONARY ANGIOGRAPHY N/A 10/24/2018   Procedure: LEFT HEART CATH AND CORONARY ANGIOGRAPHY;  Surgeon: Lennette Bihari, MD;  Location: [facility] INVASIVE CV LAB;  Service: Cardiovascular;  Laterality: N/A;   LEFT HEART CATHETERIZATION WITH CORONARY ANGIOGRAM N/A 11/20/2013   Procedure: LEFT HEART CATHETERIZATION WITH CORONARY ANGIOGRAM;  Surgeon: Marykay Lex, MD;  Location: [facility] CATH LAB;  Service: Cardiovascular;   Inf STEMI: OM1 100%, m-dRCA 95-99% thrombotic mRCA with tandem 80% -- PCI, LAD & Cx relatively normal.   PERCUTANEOUS CORONARY STENT INTERVENTION (PCI-S)  11/20/2013   a) PCI - OM1 Promus Premier DES 3.5 mm x 38 mm, m-d RCA Promus Premier DES  3.0 mm x 20 mm & 3.0 mm x 16 mm   TRANSTHORACIC ECHOCARDIOGRAM  11/20/2013   LV EF 55-60%. No regional wall motion abnormality.  Normal valves      Current Outpatient Medications  Medication Sig Dispense Refill   acetaminophen (TYLENOL) 500 MG tablet Take 500-1,000 mg by mouth every 6 (six) hours as needed for moderate pain or headache.     aspirin 81 MG tablet Take 81 mg by mouth daily.      atorvastatin (LIPITOR) 80 MG tablet Take 1 tablet (80 mg total) by mouth daily at 6 PM. NEED OV. 90 tablet 3   carvedilol (COREG) 6.25 MG tablet Take 1 tablet (6.25 mg total) by mouth 2 (two) times daily with a meal. 180 tablet 3   clopidogrel (PLAVIX) 75 MG tablet Take 1 tablet (75 mg total) by mouth daily. 90 tablet 3   ezetimibe (ZETIA) 10 MG tablet Take 1 tablet (10 mg total) by mouth daily. 90 tablet 3   nitroGLYCERIN (NITROSTAT) 0.4 MG SL tablet Place 1 tablet (0.4 mg total) under the tongue every 5 (five) minutes as needed for chest pain. 25 tablet 1   sacubitril-valsartan  (ENTRESTO) 24-26 MG Take 1 tablet by mouth 2 (two) times daily. 60 tablet 11   spironolactone (ALDACTONE) 25 MG tablet Take 1 tablet (25 mg total) by mouth daily. 90 tablet 3   No current facility-administered medications for this visit.     Allergies:   Penicillins    Social History:  The patient  reports that he has been smoking. He has never used smokeless tobacco. He reports that he does not drink alcohol or use drugs.   Family History:  The patient's family history includes Heart attack (age of onset: 60) in his father; Heart failure in his father; Hyperlipidemia in his father; Hypertension in his father and mother.    ROS: All other systems are reviewed and negative. Unless otherwise mentioned in H&P    PHYSICAL EXAM: VS:  BP 116/68  Pulse 83    Ht 6\' 2"  (1.88 m)    Wt 286 lb 9.6 oz (130 kg)    BMI 36.80 kg/m  , BMI Body mass index is 36.8 kg/m. GEN: Well nourished, well developed, in no acute distress HEENT: normal Neck: no JVD, carotid bruits, or masses Cardiac: RRR, tachycardic; no murmurs, rubs, or gallops,no edema  Respiratory:  Clear to auscultation bilaterally, normal work of breathing GI: soft, nontender, nondistended, + BS MS: no deformity or atrophy Skin: warm and dry, no rash Neuro:  Strength and sensation are intact Psych: euthymic mood, full affect   EKG: NSR, rate of  83 bpm. LAE with evidence of prior infero,antero infarct. (Unchanged from prior EKG on 10/25/2018   Recent Labs: 10/25/2018: Hemoglobin 17.2; Magnesium 2.0; Platelets 196 10/31/2018: Brain Natriuretic Peptide 37; BUN 15; Creat 0.67; Potassium 4.8; Sodium 139    Lipid Panel    Component Value Date/Time   CHOL 221 (H) 10/24/2018 0327   TRIG 164 (H) 10/24/2018 0327   HDL 34 (L) 10/24/2018 0327   CHOLHDL 6.5 10/24/2018 0327   VLDL 33 10/24/2018 0327   LDLCALC 154 (H) 10/24/2018 0327      Wt Readings from Last 3 Encounters:  11/03/18 286 lb 9.6 oz (130 kg)  10/24/18 271 lb 6.2 oz (123.1  kg)  06/01/17 220 lb (99.8 kg)      Other studies Reviewed: Left Heart Cath 10/24/2018  Prox RCA-1 lesion is 30% stenosed.  Prox RCA-2 lesion is 30% stenosed.  Previously placed Prox RCA to Mid RCA stent (unknown type) is widely patent.  Previously placed Dist RCA stent (unknown type) is widely patent.  Previously placed 1st Mrg stent (unknown type) is widely patent.  Previously placed Prox Cx stent (unknown type) is widely patent.  Prox Cx to Mid Cx lesion is 55% stenosed.  1st Diag lesion is 60% stenosed.   Multivessel CAD with mild luminal irregularity of the LAD and evidence for 60% narrowing in a very small first diagonal vessel.  The second diagonal vessel bifurcates in its mid segment and there appears to be total occlusion of the inferior limb after the bifurcation.  (This was not able to be depicted on the diagram); the stent in the circumflex vessel which extends into the OM-1 vessel was widely patent and there is ostial pinching of the AV groove circumflex with narrowing of 50 to 60%; the RCA has luminal irregularity with 30% proximal and proximal to mid stenoses.  The mid RCA stent and distal RCA stent is widely patent.  Echocardiogram 10/24/2018 1. The left ventricle has severely reduced systolic function, with an ejection fraction of 20-25%. The cavity size was mild to moderately dilated. Left ventricular diastolic Doppler parameters are consistent with pseudonormalization. Left ventricular  diffuse hypokinesis.  2. Left atrial size was mildly dilated.  3. Right atrial size was mildly dilated.  4. Trivial pericardial effusion is present.  5. No evidence of mitral valve stenosis. Trivial mitral regurgitation.  6. The aortic valve is tricuspid. No stenosis of the aortic valve.  7. The aorta is normal in size and structure.  8. The IVC was normal in size. No complete TR doppler jet so unable to estimate PA systolic pressure.  9. Normal RV size with mildly decreased systolic  function.  ASSESSMENT AND PLAN:  1. Ischemic CM: Most recent EF was found to be 20% to 25% on 10/24/2018.  He is on Entresto 24/26 mg BID, Spironolactone 25 mg daily, coreg 3.125 mg  BID. Due to elevated HR at rest, I will increase carvedilol to 6.25 mg BID. BP is low normal. I have asked him to take his BP and record his HR daily.  If he tolerates the increased dose of carvedilol without symptomatic BP, will continue higher dose. He is to see Dr. Ellyn Hack in 6 weeks.   2. CAD: Pre-mature CAD with recent cardiac cath on 10/24/2018 as above, with widely patent stents to the proximal, distal, RCA, st marginal, and proximal Cx. The first diagonal had 60% stenosis,with CTO at the bifurcation of the inferior limb, not amendable to PCI . He will continue DAPT. He is without chest pain since leaving the hospital.   3. Hypercholesterolemia: He will continue high dose statin therapy and ezetimibe 10 mg daily, with goal of LDL < 70. LDL on 10/24/2018 was 154. Will need repeat labs in 3 months with repeat echo.   Current medicines are reviewed at length with the patient today.    Labs/ tests ordered today include: BMET   Phill Myron. West Pugh, ANP, AACC   11/03/2018 3:10 PM    [facility] [place] [address] Office [phone] Fax [phone]

## 2018-11-03 ENCOUNTER — Ambulatory Visit (INDEPENDENT_AMBULATORY_CARE_PROVIDER_SITE_OTHER): Payer: BC Managed Care – PPO | Admitting: Adult Health

## 2018-11-03 ENCOUNTER — Other Ambulatory Visit: Payer: Self-pay

## 2018-11-03 ENCOUNTER — Encounter: Payer: Self-pay | Admitting: Adult Health

## 2018-11-03 VITALS — BP 116/68 | HR 83 | Ht 74.0 in | Wt 286.6 lb

## 2018-11-03 DIAGNOSIS — E785 Hyperlipidemia, unspecified: Secondary | ICD-10-CM

## 2018-11-03 DIAGNOSIS — I2129 ST elevation (STEMI) myocardial infarction involving other sites: Secondary | ICD-10-CM | POA: Diagnosis not present

## 2018-11-03 DIAGNOSIS — I43 Cardiomyopathy in diseases classified elsewhere: Secondary | ICD-10-CM | POA: Diagnosis not present

## 2018-11-03 MED ORDER — SPIRONOLACTONE 25 MG PO TABS: 25.0000 mg | ORAL_TABLET | Freq: Every day | ORAL | 3 refills | Status: DC

## 2018-11-03 MED ORDER — CLOPIDOGREL BISULFATE 75 MG PO TABS: 75.0000 mg | ORAL_TABLET | Freq: Every day | ORAL | 3 refills | Status: DC

## 2018-11-03 MED ORDER — SACUBITRIL-VALSARTAN 24-26 MG PO TABS: 1.0000 | ORAL_TABLET | Freq: Two times a day (BID) | ORAL | 11 refills | Status: DC

## 2018-11-03 MED ORDER — ATORVASTATIN CALCIUM 80 MG PO TABS: 80.0000 mg | ORAL_TABLET | Freq: Every day | ORAL | 3 refills | Status: DC

## 2018-11-03 MED ORDER — EZETIMIBE 10 MG PO TABS: 10.0000 mg | ORAL_TABLET | Freq: Every day | ORAL | 3 refills | Status: DC

## 2018-11-03 MED ORDER — CARVEDILOL 6.25 MG PO TABS: 6.2500 mg | ORAL_TABLET | Freq: Two times a day (BID) | ORAL | 3 refills | Status: DC

## 2018-11-03 MED FILL — CARVEDILOL 6.25 MG TABLET: 6.25 | 90 days supply | Qty: 180 | Fill #0

## 2018-11-03 NOTE — Patient Instructions (Addendum)
Medication Instructions:  INCREASE- Carvedilol 6.25 mg by mouth twice a day  If you need a refill on your cardiac medications before your next appointment, please call your pharmacy.  Labwork: BMP in 1 month  Testing/Procedures: None Ordered   Follow-Up: . Keep appointment in October with Dr Ellyn Hack  At [facility], you and your health needs are our priority.  As part of our continuing mission to provide you with exceptional heart care, we have created designated Provider Care Teams.  These Care Teams include your primary Cardiologist (physician) and Advanced Practice Providers (APPs -  Physician Assistants and Nurse Practitioners) who all work together to provide you with the care you need, when you need it.  Thank you for choosing [facility] at [facility]!!

## 2018-11-04 NOTE — Progress Notes (Signed)
Kidney function and electrolyte ok. BNP very low demonstrating low suspicion for any sign of volume overload.

## 2018-11-17 MED FILL — CARVEDILOL 3.125 MG TABLET: 3.125 | 30 days supply | Qty: 60 | Fill #1

## 2018-12-01 MED FILL — CARVEDILOL 6.25 MG TABLET: 6.25 | 90 days supply | Qty: 180 | Fill #0

## 2018-12-26 ENCOUNTER — Ambulatory Visit: Payer: BC Managed Care – PPO | Admitting: Cardiology

## 2019-03-06 ENCOUNTER — Telehealth: Payer: Self-pay | Admitting: *Deleted

## 2019-03-06 ENCOUNTER — Telehealth: Payer: BC Managed Care – PPO | Admitting: Cardiology

## 2019-03-06 NOTE — Telephone Encounter (Signed)
lvm to do pt's telehealth visit today.  Stated pt needs call office to R/S if pt does not call back in the allotted time.

## 2019-03-06 NOTE — Telephone Encounter (Signed)
Virtual Visit Pre-Appointment Phone Call  "(Name), I am calling you today to discuss your upcoming appointment. We are currently trying to limit exposure to the virus that causes COVID-19 by seeing patients at home rather than in the office."  1. "What is the BEST phone number to call the day of the visit?" - include this in appointment notes  2. "Do you have or have access to (through a family member/friend) a smartphone with video capability that we can use for your visit?" a. If yes - list this number in appt notes as "cell" (if different from BEST phone #) and list the appointment type as a VIDEO visit in appointment notes b. If no - list the appointment type as a PHONE visit in appointment notes  3. Confirm consent - "In the setting of the current Covid19 crisis, you are scheduled for a (phone ) visit with your provider on (Friday, December 18) at ().  Just as we do with many in-office visits, in order for you to participate in this visit, we must obtain consent.  If you'd like, I can send this to your mychart (if signed up) or email for you to review.  Otherwise, I can obtain your verbal consent now.  All virtual visits are billed to your insurance company just like a normal visit would be.  By agreeing to a virtual visit, we'd like you to understand that the technology does not allow for your provider to perform an examination, and thus may limit your provider's ability to fully assess your condition. If your provider identifies any concerns that need to be evaluated in person, we will make arrangements to do so.  Finally, though the technology is pretty good, we cannot assure that it will always work on either your or our end, and in the setting of a video visit, we may have to convert it to a phone-only visit.  In either situation, we cannot ensure that we have a secure connection.  Are you willing to proceed?" STAFF: Did the patient verbally acknowledge consent to telehealth visit? Document  YES/NO here: YES  4. Advise patient to be prepared - "Two hours prior to your appointment, go ahead and check your blood pressure, pulse, oxygen saturation, and your weight (if you have the equipment to check those) and write them all down. When your visit starts, your provider will ask you for this information. If you have an Apple Watch or Kardia device, please plan to have heart rate information ready on the day of your appointment. Please have a pen and paper handy nearby the day of the visit as well."  5. Give patient instructions for MyChart download to smartphone OR [email or web address] as below if video visit (depending on what platform provider is using)  6. Inform patient they will receive a phone call 15 minutes prior to their appointment time (may be from unknown caller ID) so they should be prepared to answer    TELEPHONE CALL NOTE  Reginald Fernandez has been deemed a candidate for a follow-up tele-health visit to limit community exposure during the Covid-19 pandemic. I spoke with the patient via phone to ensure availability of phone/video source, confirm preferred email & phone number, and discuss instructions and expectations.  I reminded Reginald Fernandez to be prepared with any vital sign and/or heart rhythm information that could potentially be obtained via home monitoring, at the time of his visit. I reminded Reginald Fernandez to expect a phone call prior  to his visit.  Reginald Fernandez Asal 03/06/2019 7:25 AM   INSTRUCTIONS FOR DOWNLOADING THE MYCHART APP TO SMARTPHONE  - The patient must first make sure to have activated MyChart and know their login information - If Apple, go to [employer] and type in MyChart in the search bar and download the app. If Android, ask patient to go to [employer] and type in [place] in the search bar and download the app. The app is free but as with any other app downloads, their phone may require them to verify saved payment information or  Apple/Android password.  - The patient will need to then log into the app with their MyChart username and password, and select [facility] as their healthcare provider to link the account. When it is time for your visit, go to the MyChart app, find appointments, and click Begin Video Visit. Be sure to Select Allow for your device to access the Microphone and Camera for your visit. You will then be connected, and your provider will be with you shortly.  **If they have any issues connecting, or need assistance please contact MyChart service desk [phone])**  **If using a computer, in order to ensure the best quality for their visit they will need to use either of the following Internet Browsers: [employer] or Google Chrome**  IF USING DOXIMITY or DOXY.ME - The patient will receive a link just prior to their visit by text.     FULL LENGTH CONSENT FOR TELE-HEALTH VISIT   I hereby voluntarily request, consent and authorize [facility] and its employed or contracted physicians, physician assistants, nurse practitioners or other licensed health care professionals (the Practitioner), to provide me with telemedicine health care services (the "Services") as deemed necessary by the treating Practitioner. I acknowledge and consent to receive the Services by the Practitioner via telemedicine. I understand that the telemedicine visit will involve communicating with the Practitioner through live audiovisual communication technology and the disclosure of certain medical information by electronic transmission. I acknowledge that I have been given the opportunity to request an in-person assessment or other available alternative prior to the telemedicine visit and am voluntarily participating in the telemedicine visit.  I understand that I have the right to withhold or withdraw my consent to the use of telemedicine in the course of my care at any time, without affecting my right to future care  or treatment, and that the Practitioner or I may terminate the telemedicine visit at any time. I understand that I have the right to inspect all information obtained and/or recorded in the course of the telemedicine visit and may receive copies of available information for a reasonable fee.  I understand that some of the potential risks of receiving the Services via telemedicine include:  Marland Kitchen Delay or interruption in medical evaluation due to technological equipment failure or disruption; . Information transmitted may not be sufficient (e.g. poor resolution of images) to allow for appropriate medical decision making by the Practitioner; and/or  . In rare instances, security protocols could fail, causing a breach of personal health information.  Furthermore, I acknowledge that it is my responsibility to provide information about my medical history, conditions and care that is complete and accurate to the best of my ability. I acknowledge that Practitioner's advice, recommendations, and/or decision may be based on factors not within their control, such as incomplete or inaccurate data provided by me or distortions of diagnostic images or specimens that may result from electronic transmissions.  I understand that the practice of medicine is not an exact science and that Practitioner makes no warranties or guarantees regarding treatment outcomes. I acknowledge that I will receive a copy of this consent concurrently upon execution via email to the email address I last provided but may also request a printed copy by calling the office of [facility].    I understand that my insurance will be billed for this visit.   I have read or had this consent read to me. . I understand the contents of this consent, which adequately explains the benefits and risks of the Services being provided via telemedicine.  . I have been provided ample opportunity to ask questions regarding this consent and the Services and have had  my questions answered to my satisfaction. . I give my informed consent for the services to be provided through the use of telemedicine in my medical care  By participating in this telemedicine visit I agree to the above.

## 2019-03-25 MED FILL — CARVEDILOL 6.25 MG TABLET: 6.25 | 90 days supply | Qty: 180 | Fill #1

## 2019-05-02 MED FILL — CLOPIDOGREL 75 MG TABLET: 75 | 90 days supply | Qty: 90 | Fill #0

## 2019-05-02 MED FILL — SPIRONOLACTONE 25 MG TABS: 25 | 90 days supply | Qty: 90 | Fill #0

## 2019-05-02 MED FILL — EZETIMIBE 10 MG TABS: 10 | 90 days supply | Qty: 90 | Fill #2

## 2019-05-12 MED FILL — SPIRONOLACTONE 25 MG TABS: 25 | 90 days supply | Qty: 90 | Fill #0

## 2019-05-12 MED FILL — EZETIMIBE 10 MG TABS: 10 | 90 days supply | Qty: 90 | Fill #2

## 2019-05-12 MED FILL — CLOPIDOGREL 75 MG TABLET: 75 | 90 days supply | Qty: 90 | Fill #0

## 2019-06-25 ENCOUNTER — Ambulatory Visit (HOSPITAL_COMMUNITY)
Admission: EM | Admit: 2019-06-25 | Discharge: 2019-06-25 | Disposition: A | Discharge status: Home / Self Care | Payer: BC Managed Care – PPO | Attending: Family Medicine | Admitting: Family Medicine

## 2019-06-25 ENCOUNTER — Other Ambulatory Visit: Payer: Self-pay

## 2019-06-25 ENCOUNTER — Encounter (HOSPITAL_COMMUNITY): Payer: Self-pay

## 2019-06-25 DIAGNOSIS — H209 Unspecified iridocyclitis: Secondary | ICD-10-CM

## 2019-06-25 DIAGNOSIS — H20042 Secondary noninfectious iridocyclitis, left eye: Secondary | ICD-10-CM | POA: Diagnosis not present

## 2019-06-25 DIAGNOSIS — H5319 Other subjective visual disturbances: Secondary | ICD-10-CM | POA: Diagnosis not present

## 2019-06-25 DIAGNOSIS — H5712 Ocular pain, left eye: Secondary | ICD-10-CM | POA: Diagnosis not present

## 2019-06-25 DIAGNOSIS — S0512XA Contusion of eyeball and orbital tissues, left eye, initial encounter: Secondary | ICD-10-CM | POA: Diagnosis not present

## 2019-06-25 MED ORDER — NEOMYCIN-POLYMYXIN-DEXAMETH 3.5-10000-0.1 OP SUSP: 1.0000 [drp] | Freq: Four times a day (QID) | OPHTHALMIC | 0 refills | Status: DC

## 2019-06-25 MED FILL — NEO/POLYMYXIN/DEXAMETH DROP: 3.5-10000-0 | 12 days supply | Qty: 5 | Fill #0

## 2019-06-25 NOTE — ED Provider Notes (Signed)
[facility]    CSN: [id number] Arrival date & time: 06/25/19  9924      History   Chief Complaint Chief Complaint  Patient presents with   Eye Pain    HPI Reginald Fernandez is a 39 y.o. male history of hypertension, hyperlipidemia presenting today for evaluation of left eye pain and redness.  Patient notes that on Friday approximately 1 week ago he was poked in the eye.  States that he has been minimal symptoms over the following couple days, but in the past 3 to 4 days he has developed increased pain, light sensitivity and sensation of grittiness in his eye.  Denies contact use.  Denies changes in vision.  Has been wearing an eye patch to help with photophobia.  Frequent tearing.  HPI  Past Medical History:  Diagnosis Date   CAD S/P percutaneous coronary angioplasty 11/20/2013   a. 09/2009: EKG with Inf STEMI - no obstructive CAD;  b. 11/2013 Inflat STEMI/PCI: LM nl, LAD 20p, D1 sm - diff dzs, D2 large - nl, LCX 95-99, OM1 100 (3.5x38 Promus Premier DES), RCA 95-35m, 80d (3.0x20 and 3.0x16 Promus Premier DES');  c. 11/2013 Echo: Ef 55-60%, no rwma.    Essential hypertension    History of echocardiogram    a. 11/2016 Echo: EF 55-60%, no rwma.   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   ACS (acute coronary syndrome) (HCC) 07/31/2016   NSTEMI (non-ST elevated myocardial infarction) (HCC) 07/31/2016   Snoring 02/22/2014   Daytime somnolence 02/22/2014   Snores 11/23/2013   Marijuana abuse    History of acute inferior wall MI 11/20/2013   ST  elevation myocardial infarction (STEMI) of true posterior wall, subsequent episode of care (HCC) 11/20/2013   CAD S/P percutaneous coronary angioplasty: DES PCI -Cx-OM1, RCA x 2 11/20/2013    Class: Status post   Essential hypertension    Hyperlipidemia LDL goal <70    HYPERGLYCEMIA 10/25/2009   ASTHMA 10/24/2009    Past Surgical History:  Procedure Laterality Date   CARDIAC CATHETERIZATION  09/2009;    Mild RCA luminal Irregularities - MIld INf HK -- Med Rx; b)   LEFT HEART CATH AND CORONARY ANGIOGRAPHY N/A 08/01/2016   Procedure: Left Heart Cath and Coronary Angiography;  Surgeon: Tonny Bollman, MD;  Location: [facility] INVASIVE CV LAB;  Service: Cardiovascular;  Laterality: N/A;   LEFT HEART CATH AND CORONARY ANGIOGRAPHY N/A 10/24/2018   Procedure: LEFT HEART CATH AND CORONARY ANGIOGRAPHY;  Surgeon: Lennette Bihari, MD;  Location: [facility] INVASIVE CV LAB;  Service: Cardiovascular;  Laterality: N/A;   LEFT HEART CATHETERIZATION WITH CORONARY ANGIOGRAM N/A 11/20/2013   Procedure: LEFT HEART CATHETERIZATION WITH CORONARY ANGIOGRAM;  Surgeon: Marykay Lex, MD;  Location: [facility] CATH LAB;  Service: Cardiovascular;   Inf STEMI: OM1 100%, m-dRCA 95-99% thrombotic mRCA with tandem 80% -- PCI, LAD & Cx relatively normal.   PERCUTANEOUS CORONARY STENT INTERVENTION (PCI-S)  11/20/2013   a) PCI - OM1 Promus Premier DES 3.5 mm x 38 mm, m-d RCA Promus Premier DES  3.0 mm x 20 mm &  3.0 mm x 16 mm   TRANSTHORACIC ECHOCARDIOGRAM  11/20/2013   LV EF 55-60%. No regional wall motion abnormality.  Normal valves        Home Medications    Prior to Admission medications   Medication Sig Start Date End Date Taking? Authorizing Provider  acetaminophen (TYLENOL) 500 MG tablet Take 500-1,000 mg by mouth every 6 (six) hours as needed for moderate pain or headache.    [provider]  aspirin 81 MG tablet Take 81 mg by mouth daily.     [provider]  atorvastatin (LIPITOR) 80 MG tablet Take 1  tablet (80 mg total) by mouth daily at 6 PM. NEED OV. 11/03/18   Jodelle Gross, NP  carvedilol (COREG) 6.25 MG tablet Take 1 tablet (6.25 mg total) by mouth 2 (two) times daily with a meal. 11/03/18   Jodelle Gross, NP  clopidogrel (PLAVIX) 75 MG tablet Take 1 tablet (75 mg total) by mouth daily. 11/03/18   Jodelle Gross, NP  ezetimibe (ZETIA) 10 MG tablet Take 1 tablet (10 mg total) by mouth daily. 11/03/18   Jodelle Gross, NP  neomycin-polymyxin b-dexamethasone (MAXITROL) 3.5-10000-0.1 SUSP Place 1-2 drops into the left eye every 6 (six) hours. 06/25/19   Giovannina Mun C, PA-C  nitroGLYCERIN (NITROSTAT) 0.4 MG SL tablet Place 1 tablet (0.4 mg total) under the tongue every 5 (five) minutes as needed for chest pain. 10/25/18   Marjie Skiff E, PA-C  sacubitril-valsartan (ENTRESTO) 24-26 MG Take 1 tablet by mouth 2 (two) times daily. 11/03/18   Jodelle Gross, NP  spironolactone (ALDACTONE) 25 MG tablet Take 1 tablet (25 mg total) by mouth daily. 11/03/18   Jodelle Gross, NP    Family History Family History  Problem Relation Age of Onset   Heart attack Father 58       2 MIs by age 22 -- CABG   Hypertension Father    Heart failure Father    Hyperlipidemia Father    Hypertension Mother     Social History Social History   Tobacco Use   Smoking status: Former Smoker    Quit date: 09/2016    Years since quitting: 2.7   Smokeless tobacco: Never Used  Substance Use Topics   Alcohol use: No    Alcohol/week: 0.0 standard drinks   Drug use: No     Allergies   Penicillins   Review of Systems Review of Systems  Constitutional: Negative for activity change, appetite change, chills, fatigue and fever.  HENT: Negative for congestion, ear pain, rhinorrhea, sinus pressure, sore throat and trouble swallowing.   Eyes: Positive for photophobia, pain, discharge and redness. Negative for itching and visual disturbance.  Respiratory: Negative for cough,  chest tightness and shortness of breath.   Cardiovascular: Negative for chest pain.  Gastrointestinal: Negative for abdominal pain, diarrhea, nausea and vomiting.  Musculoskeletal: Negative for myalgias.  Skin: Negative for rash.  Neurological: Negative for dizziness, light-headedness and headaches.     Physical Exam Triage Vital Signs ED Triage Vitals  Enc Vitals Group     BP 06/25/19 1009 111/76     Pulse Rate 06/25/19 1009 78     Resp 06/25/19 1009 15     Temp 06/25/19 1009 98.3 F (36.8 C)     Temp Source 06/25/19 1009 Oral     SpO2 06/25/19 1009 99 %     Weight --      Height --  Head Circumference --      Peak Flow --      Pain Score 06/25/19 1006 6     Pain Loc --      Pain Edu? --      Excl. in [place]? --    No data found.  Updated Vital Signs BP 111/76 (BP Location: Right Arm)    Pulse 78    Temp 98.3 F (36.8 C) (Oral)    Resp 15    SpO2 99%   Visual Acuity Right Eye Distance:  20/20 Left Eye Distance:  20/20 Bilateral Distance:  20/20  Right Eye Near:   Left Eye Near:    Bilateral Near:     Physical Exam Vitals and nursing note reviewed.  Constitutional:      Appearance: He is well-developed.     Comments: No acute distress  HENT:     Head: Normocephalic and atraumatic.     Ears:     Comments: Bilateral ears without tenderness to palpation of external auricle, tragus and mastoid, EAC's without erythema or swelling, TM's with good bony landmarks and cone of light. Non erythematous.     Nose: Nose normal.  Eyes:     Extraocular Movements: Extraocular movements intact.     Pupils: Pupils are equal, round, and reactive to light.     Comments: Left eye with diffuse injection of eye, anterior chamber clear, consensual photophobia with exam, discomfort improved with application of tetracaine, no abrasion or ulcer noted on fluorescein staining.  Difficult obtaining tonometer reading.  Cardiovascular:     Rate and Rhythm: Normal rate.  Pulmonary:      Effort: Pulmonary effort is normal. No respiratory distress.  Abdominal:     General: There is no distension.  Musculoskeletal:        General: Normal range of motion.     Cervical back: Neck supple.  Skin:    General: Skin is warm and dry.  Neurological:     Mental Status: He is alert and oriented to person, place, and time.      UC Treatments / Results  Labs (all labs ordered are listed, but only abnormal results are displayed) Labs Reviewed - No data to display  EKG   Radiology No results found.  Procedures Procedures (including critical care time)  Medications Ordered in UC Medications - No data to display  Initial Impression / Assessment and Plan / UC Course  I have reviewed the triage vital signs and the nursing notes.  Pertinent labs & imaging results that were available during my care of the patient were reviewed by me and considered in my medical decision making (see chart for details).     History and exam suggestive of possible iritis, no abrasion noted.  Visual acuity intact.  No periocular swelling or cellulitis.  Discussed with Dr. Posey Pronto who will plan to have patient follow-up tomorrow morning.  Initiating on Maxitrol eyedrops in the interim.  Discussed strict return precautions. Patient verbalized understanding and is agreeable with plan.  Final Clinical Impressions(s) / UC Diagnoses   Final diagnoses:  Left eye pain  Iritis of left eye     Discharge Instructions     Please begin using eye drops every 4-6 hours Follow up with ophthalmology tomorrow   ED Prescriptions    Medication Sig Dispense Auth. Provider   neomycin-polymyxin b-dexamethasone (MAXITROL) 3.5-10000-0.1 SUSP Place 1-2 drops into the left eye every 6 (six) hours. 5 mL Almeter Westhoff, [place] C, PA-C  PDMP not reviewed this encounter.   Lew Dawes, PA-C 06/25/19 1056

## 2019-06-25 NOTE — Discharge Instructions (Signed)
Please begin using eye drops every 4-6 hours Follow up with ophthalmology tomorrow

## 2019-06-25 NOTE — ED Triage Notes (Signed)
C/o left eye pain. Reports he was poked in the eye on Friday, and his eye started to feel better. Now he is sensitive to light, some swelling, and a 'grit like' feeling in his eye. Reports he has had corneal abrasions before and this did not feel similar at first, but now it feels similar to his past corneal abrasions.

## 2019-08-12 MED FILL — ATORVASTATIN 80 MG TABLET: 80 | 90 days supply | Qty: 90 | Fill #2

## 2019-08-12 MED FILL — CLOPIDOGREL 75 MG TABLET: 75 | 90 days supply | Qty: 90 | Fill #1

## 2019-08-12 MED FILL — CARVEDILOL 6.25 MG TABLET: 6.25 | 90 days supply | Qty: 180 | Fill #2

## 2019-08-12 MED FILL — SPIRONOLACTONE 25 MG TABS: 25 | 90 days supply | Qty: 90 | Fill #1

## 2019-08-12 MED FILL — EZETIMIBE 10 MG TABS: 10 | 90 days supply | Qty: 90 | Fill #3

## 2019-11-24 ENCOUNTER — Other Ambulatory Visit: Payer: Self-pay | Admitting: Adult Health

## 2019-11-25 ENCOUNTER — Other Ambulatory Visit: Payer: Self-pay

## 2019-11-25 ENCOUNTER — Telehealth: Payer: Self-pay | Admitting: Cardiology

## 2019-11-25 MED FILL — CARVEDILOL 6.25 MG TABLET: 6.25 | 30 days supply | Qty: 60 | Fill #0

## 2019-11-25 MED FILL — SPIRONOLACTONE 25 MG TABS: 25 | 30 days supply | Qty: 30 | Fill #0

## 2019-11-25 NOTE — Telephone Encounter (Signed)
   *  STAT* If patient is at the pharmacy, call can be transferred to refill team.   1. Which medications need to be refilled? (please list name of each medication and dose if known)   sacubitril-valsartan (ENTRESTO) 24-26 MG    2. Which pharmacy/location (including street and city if local pharmacy) is medication to be sent to? [facility], [address], [place], [place] [address]  3. Do they need a 30 day or 90 day supply? 30 days    *STAT* If patient is at the pharmacy, call can be transferred to refill team.   1. Which medications need to be refilled? (please list name of each medication and dose if known) atorvastatin (LIPITOR) 80 MG tablet, carvedilol (COREG) 6.25 MG tablet,   ezetimibe (ZETIA) 10 MG tablet    spironolactone (ALDACTONE) 25 MG tablet   2. Which pharmacy/location (including street and city if local pharmacy) is medication to be sent to?[facility] [place], [place] - [address] [place]  3. Do they need a 30 day or 90 day supply?90 days   Pt only have 4 pills left on all his medications

## 2019-11-26 ENCOUNTER — Other Ambulatory Visit: Payer: Self-pay

## 2019-11-26 MED ORDER — EZETIMIBE 10 MG PO TABS
10.0000 mg | ORAL_TABLET | Freq: Every day | ORAL | 1 refills | Status: DC
Start: 2019-11-26 — End: 2019-12-04

## 2019-11-26 MED ORDER — ATORVASTATIN CALCIUM 80 MG PO TABS: 80.0000 mg | ORAL_TABLET | Freq: Every day | ORAL | 1 refills | Status: DC

## 2019-11-26 MED ORDER — SPIRONOLACTONE 25 MG PO TABS
25.0000 mg | ORAL_TABLET | Freq: Every day | ORAL | 1 refills | Status: DC
Start: 2019-11-26 — End: 2019-12-04

## 2019-11-26 MED FILL — ATORVASTATIN 80 MG TABLET: 80 | 30 days supply | Qty: 30 | Fill #0

## 2019-11-26 MED FILL — EZETIMIBE 10 MG TABS: 10 | 30 days supply | Qty: 30 | Fill #0

## 2019-12-04 ENCOUNTER — Other Ambulatory Visit: Payer: Self-pay | Admitting: Cardiology

## 2019-12-04 ENCOUNTER — Telehealth: Payer: Self-pay | Admitting: *Deleted

## 2019-12-04 ENCOUNTER — Telehealth (INDEPENDENT_AMBULATORY_CARE_PROVIDER_SITE_OTHER): Payer: BC Managed Care – PPO | Admitting: Cardiology

## 2019-12-04 ENCOUNTER — Encounter: Payer: Self-pay | Admitting: Cardiology

## 2019-12-04 VITALS — Ht 74.0 in | Wt 236.0 lb

## 2019-12-04 DIAGNOSIS — E785 Hyperlipidemia, unspecified: Secondary | ICD-10-CM

## 2019-12-04 DIAGNOSIS — I2510 Atherosclerotic heart disease of native coronary artery without angina pectoris: Secondary | ICD-10-CM

## 2019-12-04 DIAGNOSIS — I420 Dilated cardiomyopathy: Secondary | ICD-10-CM | POA: Diagnosis not present

## 2019-12-04 DIAGNOSIS — I5042 Chronic combined systolic (congestive) and diastolic (congestive) heart failure: Secondary | ICD-10-CM | POA: Diagnosis not present

## 2019-12-04 DIAGNOSIS — I2129 ST elevation (STEMI) myocardial infarction involving other sites: Secondary | ICD-10-CM | POA: Diagnosis not present

## 2019-12-04 DIAGNOSIS — I10 Essential (primary) hypertension: Secondary | ICD-10-CM

## 2019-12-04 DIAGNOSIS — Z7189 Other specified counseling: Secondary | ICD-10-CM

## 2019-12-04 DIAGNOSIS — I252 Old myocardial infarction: Secondary | ICD-10-CM

## 2019-12-04 DIAGNOSIS — Z9861 Coronary angioplasty status: Secondary | ICD-10-CM

## 2019-12-04 MED ORDER — ATORVASTATIN CALCIUM 80 MG PO TABS
80.0000 mg | ORAL_TABLET | Freq: Every day | ORAL | 3 refills | Status: DC
Start: 2019-12-04 — End: 2021-10-12

## 2019-12-04 MED ORDER — CLOPIDOGREL BISULFATE 75 MG PO TABS: 75.0000 mg | ORAL_TABLET | Freq: Every day | ORAL | 3 refills | Status: DC

## 2019-12-04 MED ORDER — EZETIMIBE 10 MG PO TABS: 10.0000 mg | ORAL_TABLET | Freq: Every day | ORAL | 3 refills | Status: DC

## 2019-12-04 MED ORDER — SPIRONOLACTONE 25 MG PO TABS
25.0000 mg | ORAL_TABLET | Freq: Every day | ORAL | 3 refills | Status: DC
Start: 2019-12-04 — End: 2019-12-04

## 2019-12-04 MED ORDER — SACUBITRIL-VALSARTAN 24-26 MG PO TABS: 1.0000 | ORAL_TABLET | Freq: Two times a day (BID) | ORAL | 3 refills | Status: DC

## 2019-12-04 MED ORDER — CARVEDILOL 6.25 MG PO TABS: ORAL_TABLET | ORAL | 3 refills | Status: DC

## 2019-12-04 MED ORDER — NITROGLYCERIN 0.4 MG SL SUBL: 0.4000 mg | SUBLINGUAL_TABLET | SUBLINGUAL | 3 refills | Status: DC | PRN

## 2019-12-04 MED FILL — CLOPIDOGREL 75 MG TABLET: 75 | 90 days supply | Qty: 90 | Fill #0

## 2019-12-04 MED FILL — NITROGLYCERIN 0.4 MG TAB SL: 0.4 | 7 days supply | Qty: 25 | Fill #0

## 2019-12-04 NOTE — Telephone Encounter (Signed)
°  Patient Consent for Virtual Visit         Reginald Fernandez has provided verbal consent on 12/04/2019 for a virtual visit (video or telephone).   CONSENT FOR VIRTUAL VISIT FOR:  Reginald Fernandez  By participating in this virtual visit I agree to the following:  I hereby voluntarily request, consent and authorize [facility] and its employed or contracted physicians, physician assistants, nurse practitioners or other licensed health care professionals (the Practitioner), to provide me with telemedicine health care services (the Services") as deemed necessary by the treating Practitioner. I acknowledge and consent to receive the Services by the Practitioner via telemedicine. I understand that the telemedicine visit will involve communicating with the Practitioner through live audiovisual communication technology and the disclosure of certain medical information by electronic transmission. I acknowledge that I have been given the opportunity to request an in-person assessment or other available alternative prior to the telemedicine visit and am voluntarily participating in the telemedicine visit.  I understand that I have the right to withhold or withdraw my consent to the use of telemedicine in the course of my care at any time, without affecting my right to future care or treatment, and that the Practitioner or I may terminate the telemedicine visit at any time. I understand that I have the right to inspect all information obtained and/or recorded in the course of the telemedicine visit and may receive copies of available information for a reasonable fee.  I understand that some of the potential risks of receiving the Services via telemedicine include:   Delay or interruption in medical evaluation due to technological equipment failure or disruption;  Information transmitted may not be sufficient (e.g. poor resolution of images) to allow for appropriate medical decision making by the Practitioner;  and/or   In rare instances, security protocols could fail, causing a breach of personal health information.  Furthermore, I acknowledge that it is my responsibility to provide information about my medical history, conditions and care that is complete and accurate to the best of my ability. I acknowledge that Practitioner's advice, recommendations, and/or decision may be based on factors not within their control, such as incomplete or inaccurate data provided by me or distortions of diagnostic images or specimens that may result from electronic transmissions. I understand that the practice of medicine is not an exact science and that Practitioner makes no warranties or guarantees regarding treatment outcomes. I acknowledge that a copy of this consent can be made available to me via my patient portal [facility] MyChart), or I can request a printed copy by calling the office of [facility].    I understand that my insurance will be billed for this visit.   I have read or had this consent read to me.  I understand the contents of this consent, which adequately explains the benefits and risks of the Services being provided via telemedicine.   I have been provided ample opportunity to ask questions regarding this consent and the Services and have had my questions answered to my satisfaction.  I give my informed consent for the services to be provided through the use of telemedicine in my medical care

## 2019-12-04 NOTE — Patient Instructions (Addendum)
Medication Instructions:   No changes  *If you need a refill on your cardiac medications before your next appointment, please call your pharmacy*   Lab Work:  Need to check Fasting Lipid Panel, Chem Panel, CBC     Testing/Procedures:  Need to Check f/u Echocardiogram   Follow-Up: At [facility], you and your health needs are our priority.  As part of our continuing mission to provide you with exceptional heart care, we have created designated Provider Care Teams.  These Care Teams include your primary Cardiologist (physician) and Advanced Practice Providers (APPs -  Physician Assistants and Nurse Practitioners) who all work together to provide you with the care you need, when you need it.  We recommend signing up for the patient portal called "MyChart".  Sign up information is provided on this After Visit Summary.  MyChart is used to connect with patients for Virtual Visits (Telemedicine).  Patients are able to view lab/test results, encounter notes, upcoming appointments, etc.  Non-urgent messages can be sent to your provider as well.   To learn more about what you can do with MyChart, go to [email or web address].    Your next appointment:   4 month(s)  The format for your next appointment:   In Person  Provider:   Bryan Lemma, MD   Other Instructions Keep staying Active

## 2019-12-04 NOTE — Progress Notes (Signed)
Virtual Visit via Telephone Note   This visit type was conducted due to national recommendations for restrictions regarding the COVID-19 Pandemic (e.g. social distancing) in an effort to limit this patient's exposure and mitigate transmission in our community.  Due to his co-morbid illnesses, this patient is at least at moderate risk for complications without adequate follow up.  This format is felt to be most appropriate for this patient at this time.  The patient did not have access to video technology/had technical difficulties with video requiring transitioning to audio format only (telephone).  All issues noted in this document were discussed and addressed.  No physical exam could be performed with this format.  Please refer to the patient's chart for his  consent to telehealth for [facility].   Patient has given verbal permission to conduct this visit via virtual appointment and to bill insurance 12/06/2019 11:09 PM     Evaluation Performed:  Follow-up visit  Date:  12/06/2019   ID:  Reginald Fernandez, DOB 08-23-1980, MRN [id number]  Patient Location: Home Provider Location: Office/Clinic  PCP:  Patient, No Pcp Per  Cardiologist:  Bryan Lemma, MD  Electrophysiologist:  None   Chief Complaint:   Chief Complaint  Patient presents with  . Follow-up    doing well.  ?? re: COVID Vaccine  . Coronary Artery Disease    No CAD  . Cardiomyopathy    No CHF Sx    History of Present Illness:    Reginald Fernandez is a 39 y.o. male with PMH notable for CAD (multiple stents to LCx-OM and RCA in 2015, and NonSTEMI October 23, 2018 who presents via Web designer for a telehealth visit today.   September 2015 - Inf STEMI: OM1 100%, m-dRCA 95-99% thrombotic mRCA with tandem 80% -- PCI, LAD & Cx relatively normal.;  Echo with normal EF and no obvious wall motion.  May 2018-NSTEMI: Patent RCA and LCx-OM stents.  EF 35-40% by echo with basal anterolateral hypokinesis, basal and mid  inferobasal akinesis and mid-apical inferior akinesis.  GRII DD.  August 2020-NSTEMI: Patent stents, likely culprit lesion was occluded inferior limb of D2 -> medical management ; EF 20 to 25% by Echo with GRII DD.  Diffuse HK.  Mild biatrial enlargement.  Reginald Fernandez was last seen on 11/03/2018 by Joni Reining, DNP as a post hospital follow-up from his non-STEMI earlier that month.- no complaints. --> Seem to be tolerating Entresto 24/26 mg twice daily, spironolactone 20 mg daily and carvedilol 3.25 mg twice daily.  -->  Note indicates medical compliance with no anginal or CHF symptoms.  Walking 2.5 miles per day along with push-ups and sit ups.  Energy notably improved.  Plan was follow-up echocardiogram in 3 months with consideration of ICD if necessary.  Also plan to follow-up lipid panel.  Plan was to be seen after echo.  Hospitalizations:  . n.a   Recent - Interim CV studies:    The following studies were reviewed today: . NSTEMI May 2018:  o Cardiac Cath (08/01/2016): Stable 2 V CAD - patent RCA & OM stents. Diffuse, non-obstructive CAD elsewhere. Moderately elevated LVEDP --> No culprit lesion  => restarted Plavix & titrate Med Rx. o Echo (08/11/2016): Moderately dilated LV.  Basal anterolateral HK with basal-mid inferolateral akinesis.  Mild-apical inferior akinesis.  EF 35 to 40%.  GRII DD.  Mild biatrial dilation.  Normal RV size and function. Mostly normal valves.  . NSTEMI 10/2018: o Cardiac Cath (10/24/2018):  60% small D1, D2 has 3 branches 1 of which is small and appears to be 100%, LCx-OM widely patent with mild 55% stenosis in the jailed AV groove LCx. RCA has mild luminal irregularities 30% proximal with widely patent stents in the mid and distal RCA. (Culprit lesion thought to be occluded inferior limb of diagonal-too small for PCI). o TTE (10/24/2018): EF 20-25%.  Mild mildly dilated LV.  GR 2 DD.  Mild biatrial dilation.  Normal PA pressures.  Inerval History    Reginald Fernandez tells me that he is doing great.  Has lost ~50 lb - change in lifestyle.  Did not know that he was to see me last fall. No labs since last year.   Very active: No cardiac Sx.    Cardiovascular ROS: no chest pain or dyspnea on exertion negative for - edema, irregular heartbeat, orthopnea, palpitations, paroxysmal nocturnal dyspnea, rapid heart rate, shortness of breath or syncope / near syncope; TIA/amaurosis fugax.  Claudication  ROS:  Please see the history of present illness.    The patient does not have symptoms concerning for COVID-19 infection (fever, chills, cough, or new shortness of breath).  Review of Systems  Constitutional: Positive for weight loss (see below). Negative for malaise/fatigue.  HENT: Negative for congestion.   Respiratory: Negative for cough and shortness of breath.   Gastrointestinal: Negative for blood in stool and melena.  Genitourinary: Negative for hematuria.  Musculoskeletal: Negative for joint pain and myalgias.  Neurological: Negative for dizziness, focal weakness, weakness and headaches.  Psychiatric/Behavioral: Negative for memory loss. The patient is not nervous/anxious and does not have insomnia.    The patient is practicing social distancing.  Past Medical History:  Diagnosis Date  . CAD S/P percutaneous coronary angioplasty 11/20/2013   a. 09/2009: EKG with Inf STEMI - no obstructive CAD;  b. 11/2013 Inflat STEMI/PCI: LM nl, LAD 20p, D1 sm - diff dzs, D2 large - nl, LCX 95-99, OM1 100 (3.5x38 Promus Premier DES), RCA 95-63m, 80d (3.0x20 and 3.0x16 Promus Premier DES') - normal EF;  c. NSTEMI 5/18 - patent stents, otw minimal CAD.- EF by Echo 35-40%; d. NSTEMI 10/2018 - ? culprit - Small branch of  D2,Med Rx,.  EF by Echo ~25%  . Cardiomyopathy, ischemic 07/2016   h/o Inf STEMI 11/2013 - EF was "Normal"; b) NSTEMI 07/2016 (NO Cultprit on Cath) - Echo EF 35-40% (Basal Ant-Lat HK, basal-mid Inferolateral & apical Akinesis); c) NSTEMI 10/2018  -felt to be occluded small branch of D2, Echo EF further decreased to 20-25%.   . Essential hypertension   . Hyperlipidemia with target LDL less than 100   . Marijuana abuse   . Migraine   . Morbid obesity (HCC)   . ST-segment elevation myocardial infarction (STEMI) of inferior wall (HCC) 11/20/2013   Past Surgical History:  Procedure Laterality Date  . LEFT HEART CATH AND CORONARY ANGIOGRAPHY N/A 08/01/2016   Procedure: Left Heart Cath and Coronary Angiography;  Surgeon: Tonny Bollman, MD;  Location: [facility] INVASIVE CV LAB;;  Stable 2 V CAD - patent RCA & OM stents. Diffuse, non-obstructive CAD elsewhere. Moderately elevated LVEDP --> No culprit lesion  => restarted Plavix & titrate Med Rx.  Marland Kitchen LEFT HEART CATH AND CORONARY ANGIOGRAPHY N/A 10/24/2018   Procedure: LEFT HEART CATH AND CORONARY ANGIOGRAPHY;  Surgeon: Lennette Bihari, MD;  Location: [facility] INVASIVE CV LAB;  60% small D1, D2 has 3 branches 1 of which is small and appears to be 100%, LCx-OM widely  patent with mild 55% stenosis in the jailed AV groove LCx. RCA has mild luminal irregularities 30% proximal with widely patent stents in the mid and distal RCA. (Culprit 100% small branch of D2)  . LEFT HEART CATH AND CORONARY ANGIOGRAPHY  09/2009   (Presumably in the setting of STEMI) mild RCA luminal Irregularities - MIld INf HK -- Med Rx; b)  . LEFT HEART CATHETERIZATION WITH CORONARY ANGIOGRAM N/A 11/20/2013   Procedure: LEFT HEART CATHETERIZATION WITH CORONARY ANGIOGRAM;  Surgeon: Marykay Lex, MD;  Location: [facility] CATH LAB;  Service: Cardiovascular;   Inf STEMI: OM1 100%, m-dRCA 95-99% thrombotic mRCA with tandem 80% -- PCI, LAD & Cx relatively normal.  . PERCUTANEOUS CORONARY STENT INTERVENTION (PCI-S)  11/20/2013   a) PCI - OM1 Promus Premier DES 3.5 mm x 38 mm, m-d RCA Promus Premier DES  3.0 mm x 20 mm & 3.0 mm x 16 mm  . TRANSTHORACIC ECHOCARDIOGRAM  11/20/2013   LV EF 55-60%. No regional wall motion abnormality.  Normal valves   .  TRANSTHORACIC ECHOCARDIOGRAM  08/11/2016   (Non-STEMI-no culprit lesion): Moderately dilated LV.  Basal anterolateral HK with basal-mid inferolateral akinesis.  Mild-apical inferior akinesis.  EF 35 to 40%.  GRII DD.  Mild biatrial dilation.  Normal RV size and function. Mostly normal valves.  . TRANSTHORACIC ECHOCARDIOGRAM  10/24/2018   (Non-STEMI-culprit lesion was 1 of 3 small 2 branches.)  EF 20-25%.  Mild mildly dilated LV.  GR 2 DD.  Mild biatrial dilation.  Normal PA pressures.    Diagnostic Cath 10/2018: No obvious culprit lesion; Med Rx.    Current Meds  Medication Sig  . acetaminophen (TYLENOL) 500 MG tablet Take 500-1,000 mg by mouth every 6 (six) hours as needed for moderate pain or headache.  Marland Kitchen aspirin 81 MG tablet Take 81 mg by mouth daily.   Marland Kitchen atorvastatin (LIPITOR) 80 MG tablet Take 1 tablet (80 mg total) by mouth daily at 6 PM. NEED OV.  . carvedilol (COREG) 6.25 MG tablet TAKE 1 TABLET BY MOUTH TWO TIMES DAILY WITH A MEAL  . clopidogrel (PLAVIX) 75 MG tablet Take 1 tablet (75 mg total) by mouth daily.  Marland Kitchen ezetimibe (ZETIA) 10 MG tablet Take 1 tablet (10 mg total) by mouth daily.  . nitroGLYCERIN (NITROSTAT) 0.4 MG SL tablet Place 1 tablet (0.4 mg total) under the tongue every 5 (five) minutes as needed for chest pain.  . sacubitril-valsartan (ENTRESTO) 24-26 MG Take 1 tablet by mouth 2 (two) times daily.  Marland Kitchen spironolactone (ALDACTONE) 25 MG tablet Take 1 tablet (25 mg total) by mouth daily.  . [DISCONTINUED] atorvastatin (LIPITOR) 80 MG tablet Take 1 tablet (80 mg total) by mouth daily at 6 PM. NEED OV.  . [DISCONTINUED] carvedilol (COREG) 6.25 MG tablet TAKE 1 TABLET BY MOUTH TWO TIMES DAILY WITH A MEAL  . [DISCONTINUED] clopidogrel (PLAVIX) 75 MG tablet Take 1 tablet (75 mg total) by mouth daily.  . [DISCONTINUED] ezetimibe (ZETIA) 10 MG tablet Take 1 tablet (10 mg total) by mouth daily.  . [DISCONTINUED] nitroGLYCERIN (NITROSTAT) 0.4 MG SL tablet Place 1 tablet (0.4 mg  total) under the tongue every 5 (five) minutes as needed for chest pain.  . [DISCONTINUED] sacubitril-valsartan (ENTRESTO) 24-26 MG Take 1 tablet by mouth 2 (two) times daily.  . [DISCONTINUED] spironolactone (ALDACTONE) 25 MG tablet Take 1 tablet (25 mg total) by mouth daily.     Allergies:   Penicillins   Social History   Tobacco Use  .  Smoking status: Former Smoker    Quit date: 09/2016    Years since quitting: 3.2  . Smokeless tobacco: Never Used  Vaping Use  . Vaping Use: Some days  Substance Use Topics  . Alcohol use: No    Alcohol/week: 0.0 standard drinks  . Drug use: No     Family Hx: The patient's family history includes Heart attack (age of onset: 60) in his father; Heart failure in his father; Hyperlipidemia in his father; Hypertension in his father and mother.   Labs/Other Tests and Data Reviewed:    EKG:  No ECG reviewed.  Recent Labs: No results found for requested labs within last 8760 hours.   Recent Lipid Panel Lab Results  Component Value Date/Time   CHOL 221 (H) 10/24/2018 03:27 AM   TRIG 164 (H) 10/24/2018 03:27 AM   HDL 34 (L) 10/24/2018 03:27 AM   CHOLHDL 6.5 10/24/2018 03:27 AM   LDLCALC 154 (H) 10/24/2018 03:27 AM    Wt Readings from Last 3 Encounters:  12/04/19 236 lb (107 kg)  11/03/18 286 lb 9.6 oz (130 kg)  10/24/18 271 lb 6.2 oz (123.1 kg)   -- 50 lb wgt loss; increased exercise; changed diet - complete lifestyle  Objective:    Vital Signs:  Ht 6\' 2"  (1.88 m)   Wt 236 lb (107 kg)   BMI 30.30 kg/m   VITAL SIGNS:  reviewed; ~118/78 yesterday Well nourished, well developed male in NO acute distress. A&O x 3.  Normal Mood & Affect Non-labored respirations   ASSESSMENT & PLAN:    Problem List Items Addressed This Visit    History of acute inferior wall MI (Chronic)   Relevant Orders   ECHOCARDIOGRAM COMPLETE   Lipid panel   Comprehensive metabolic panel   CBC   ST elevation myocardial infarction (STEMI) of true posterior  wall, subsequent episode of care [facility]) (Chronic)    I wonder if his reduced EF that was seen last year was just progression from when he had his initial MI back in 2015.  Interestingly, the echo at that time had preserved EF.  Follow-up echoes in 2018 and 2020 have shown stepwise reduction of his EF down to 20-25% with regional wall motion normalities seen in 2018 not described in 2020.  Thankfully, he is not having any recurrent angina or heart failure symptoms.      Relevant Medications   atorvastatin (LIPITOR) 80 MG tablet   carvedilol (COREG) 6.25 MG tablet   ezetimibe (ZETIA) 10 MG tablet   nitroGLYCERIN (NITROSTAT) 0.4 MG SL tablet   sacubitril-valsartan (ENTRESTO) 24-26 MG   spironolactone (ALDACTONE) 25 MG tablet   CAD S/P percutaneous coronary angioplasty: DES PCI -Cx-OM1, RCA x 2 (Chronic)    Status post PCI of the circumflex-OM1 the culprit setting as well as RCA.  This was in the setting of a STEMI back in 2015.  He he has subsequently had cath in 2018 and 2020 that did not show significant change.  The 2020 cath did show an occluded branch of the diagonal not as a PCI target.  He was switched to Plavix to reduce cost and bleeding issues from Effient. -Okay to hold Plavix 5 to 7 days preop for surgeries or procedures.  Plan:  Can discontinue aspirin and continue Plavix as noted  Continue beta-blocker and Entresto.  Continue high-dose high intensity statin along with Zetia.  In the absence of ongoing angina, no need for ischemic evaluation.      Relevant Medications  atorvastatin (LIPITOR) 80 MG tablet   carvedilol (COREG) 6.25 MG tablet   ezetimibe (ZETIA) 10 MG tablet   nitroGLYCERIN (NITROSTAT) 0.4 MG SL tablet   sacubitril-valsartan (ENTRESTO) 24-26 MG   spironolactone (ALDACTONE) 25 MG tablet   Other Relevant Orders   ECHOCARDIOGRAM COMPLETE   Lipid panel   Comprehensive metabolic panel   CBC   Dilated cardiomyopathy (HCC) (Chronic)    Very interesting that  his EF got stepwise worse with his non-STEMI as it did not have significant coronary disease as a culprit lesions.  I suspect this could just be progression of disease and perhaps scar tissue formation at the point of his original STEMI.  He is thankfully on as best we can tell appropriate medication with spironolactone, Entresto and carvedilol all the way to high pressures to the titrate. He needs a follow-up echocardiogram to reassess.   Again we will need to readdress the issue of possible need for defibrillator.      Relevant Medications   atorvastatin (LIPITOR) 80 MG tablet   carvedilol (COREG) 6.25 MG tablet   ezetimibe (ZETIA) 10 MG tablet   nitroGLYCERIN (NITROSTAT) 0.4 MG SL tablet   sacubitril-valsartan (ENTRESTO) 24-26 MG   spironolactone (ALDACTONE) 25 MG tablet   Other Relevant Orders   ECHOCARDIOGRAM COMPLETE   Comprehensive metabolic panel   CBC   Chronic combined systolic and diastolic heart failure (HCC) - Primary (Chronic)    As of his MI back in August 2020, he had an EF of 20 to 25% which was dramatically reduced compared to his previous EF and 2015.  Interestingly, he really is not having any signs or symptoms of any active heart failure.  At most class I.  He is able to do whatever chores he would like to do.  He has lost significant weight.  Unfortunately, he did not follow-up as recommended for either follow-up evaluation, or follow-up echocardiogram.  Very difficult to know how to titrate medications as we do not have vital signs or recent labs.  He has been lost to follow-up almost for the entire year despite having severe cardiomyopathy.  Thankfully, symptomatically he is doing very well.  Plan:   Continue current doses of carvedilol, Entresto and spironolactone--he is not having any signs or symptoms to suggest worsening heart failure.  No requirement for further diuretic.  Check chemistry panel  Check 2D echo to reassess EF.  Will need to be seen in  person following echo in order to obtain accurate blood pressure evaluation.       Relevant Medications   atorvastatin (LIPITOR) 80 MG tablet   carvedilol (COREG) 6.25 MG tablet   ezetimibe (ZETIA) 10 MG tablet   nitroGLYCERIN (NITROSTAT) 0.4 MG SL tablet   sacubitril-valsartan (ENTRESTO) 24-26 MG   spironolactone (ALDACTONE) 25 MG tablet   Educated about COVID-19 virus infection (Chronic)    Reginald Fernandez had initially asked for an exemption from getting the vaccine that may be required for his work.  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  As such, I think he would be best served with vaccination.  He is very incredulous about the benefits of the vaccine is very concerned about conspiracy theories and and and adverse effects.  He again is perseverating on unsubstantiated lay media sources as opposed to understanding medical science.  I did explain to  him the pathophysiology of the disease and also the mechanism of how this vaccines work.  I explained to him that this is very similar to flu vaccine and that it may very well end up being an annual requirement much like a flu vaccine is. We spent 15 minutes talking about COVID-19 and the vaccine.  He still has not inclined to go for the vaccine, but will take it under advisement.  He has father-in-law now that he would have been.      Essential hypertension (Chronic)   Relevant Medications   atorvastatin (LIPITOR) 80 MG tablet   carvedilol (COREG) 6.25 MG tablet   ezetimibe (ZETIA) 10 MG tablet   nitroGLYCERIN (NITROSTAT) 0.4 MG SL tablet   sacubitril-valsartan (ENTRESTO) 24-26 MG   spironolactone (ALDACTONE) 25 MG tablet   Hyperlipidemia LDL goal <70 (Chronic)    Should be due for lipid panel and LFTs.  By last year's evaluation, her lipids were not adequately controlled at all.  He is now on high-dose  high intensity statin along with Zetia.  Plan: Continue current meds with we will check lipid panel and chemistry panel.  Adjust medications based on results.  I suspect he may require CVRR.  Only cost assessment for continued intense increased dose of Entresto and potentially PCSK9 inhibitor      Relevant Medications   atorvastatin (LIPITOR) 80 MG tablet   carvedilol (COREG) 6.25 MG tablet   ezetimibe (ZETIA) 10 MG tablet   nitroGLYCERIN (NITROSTAT) 0.4 MG SL tablet   sacubitril-valsartan (ENTRESTO) 24-26 MG   spironolactone (ALDACTONE) 25 MG tablet   Other Relevant Orders   Lipid panel      COVID-19 Education: The signs and symptoms of COVID-19 were discussed with the patient and how to seek care for testing (follow up with PCP or arrange E-visit).   The importance of social distancing was discussed today.  Time:   Today, I have spent 35 minutes with the patient with telehealth technology discussing the above problems.   5-6 min charting.  Medication Adjustments/Labs and Tests Ordered: Current medicines are reviewed at length with the patient today.  Concerns regarding medicines are outlined above.   Patient Instructions  Medication Instructions:   No changes  *If you need a refill on your cardiac medications before your next appointment, please call your pharmacy*   Lab Work:  Need to check Fasting Lipid Panel, Chem Panel, CBC     Testing/Procedures:  Need to Check f/u Echocardiogram   Follow-Up: At [facility], you and your health needs are our priority.  As part of our continuing mission to provide you with exceptional heart care, we have created designated Provider Care Teams.  These Care Teams include your primary Cardiologist (physician) and Advanced Practice Providers (APPs -  Physician Assistants and Nurse Practitioners) who all work together to provide you with the care you need, when you need it.  We recommend signing up for the patient portal  called "MyChart".  Sign up information is provided on this After Visit Summary.  MyChart is used to connect with patients for Virtual Visits (Telemedicine).  Patients are able to view lab/test results, encounter notes, upcoming appointments, etc.  Non-urgent messages can be sent to your provider as well.   To learn more about what you can do with MyChart, go to [email or web address].    Your next appointment:   4 month(s)  The format for your next appointment:   In Person  Provider:   Bryan Lemma, MD  Other Instructions Keep staying Active     Signed, Bryan Lemma, MD  12/06/2019 11:09 PM     [facility]

## 2019-12-04 NOTE — Telephone Encounter (Signed)
RN spoke to patient. Instruction were given  from today's virtual visit 9/17/2 .  AVS SUMMARY has been mailed to patient and labslip prescription sent to pharmacies of choice   4 month follow up schedule on jan 21,2022 at 10 am  .   Patient verbalized understanding

## 2019-12-06 ENCOUNTER — Encounter: Payer: Self-pay | Admitting: Cardiology

## 2019-12-06 DIAGNOSIS — Z7189 Other specified counseling: Secondary | ICD-10-CM | POA: Insufficient documentation

## 2019-12-06 NOTE — Assessment & Plan Note (Signed)
I wonder if his reduced EF that was seen last year was just progression from when he had his initial MI back in 2015.  Interestingly, the echo at that time had preserved EF.  Follow-up echoes in 2018 and 2020 have shown stepwise reduction of his EF down to 20-25% with regional wall motion normalities seen in 2018 not described in 2020.  Thankfully, he is not having any recurrent angina or heart failure symptoms.

## 2019-12-06 NOTE — Assessment & Plan Note (Addendum)
As of his MI back in August 2020, he had an EF of 20 to 25% which was dramatically reduced compared to his previous EF and 2015.  Interestingly, he really is not having any signs or symptoms of any active heart failure.  At most class I.  He is able to do whatever chores he would like to do.  He has lost significant weight.  Unfortunately, he did not follow-up as recommended for either follow-up evaluation, or follow-up echocardiogram.  Very difficult to know how to titrate medications as we do not have vital signs or recent labs.  He has been lost to follow-up almost for the entire year despite having severe cardiomyopathy.  Thankfully, symptomatically he is doing very well.  Plan:   Continue current doses of carvedilol, Entresto and spironolactone--he is not having any signs or symptoms to suggest worsening heart failure.  No requirement for further diuretic.  Check chemistry panel  Check 2D echo to reassess EF.  Will need to be seen in person following echo in order to obtain accurate blood pressure evaluation.

## 2019-12-06 NOTE — Assessment & Plan Note (Signed)
Very interesting that his EF got stepwise worse with his non-STEMI as it did not have significant coronary disease as a culprit lesions.  I suspect this could just be progression of disease and perhaps scar tissue formation at the point of his original STEMI.  He is thankfully on as best we can tell appropriate medication with spironolactone, Entresto and carvedilol all the way to high pressures to the titrate. He needs a follow-up echocardiogram to reassess.   Again we will need to readdress the issue of possible need for defibrillator.

## 2019-12-06 NOTE — Assessment & Plan Note (Signed)
Status post PCI of the circumflex-OM1 the culprit setting as well as RCA.  This was in the setting of a STEMI back in 2015.  He he has subsequently had cath in 2018 and 2020 that did not show significant change.  The 2020 cath did show an occluded branch of the diagonal not as a PCI target.  He was switched to Plavix to reduce cost and bleeding issues from Effient. -Okay to hold Plavix 5 to 7 days preop for surgeries or procedures.  Plan:  Can discontinue aspirin and continue Plavix as noted  Continue beta-blocker and Entresto.  Continue high-dose high intensity statin along with Zetia.  In the absence of ongoing angina, no need for ischemic evaluation.

## 2019-12-06 NOTE — Assessment & Plan Note (Signed)
Reginald Fernandez had initially asked for an exemption from getting the vaccine that may be required for his work.  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  As such, I think he would be best served with vaccination.  He is very incredulous about the benefits of the vaccine is very concerned about conspiracy theories and and and adverse effects.  He again is perseverating on unsubstantiated lay media sources as opposed to understanding medical science.  I did explain to him the pathophysiology of the disease and also the mechanism of how this vaccines work.  I explained to him that this is very similar to flu vaccine and that it may very well end up being an annual requirement much like a flu vaccine is. We spent 15 minutes talking about COVID-19 and the vaccine.  He still has not inclined to go for the vaccine, but will take it under advisement.  He has father-in-law now that he would have been.

## 2019-12-06 NOTE — Assessment & Plan Note (Signed)
Should be due for lipid panel and LFTs.  By last year's evaluation, her lipids were not adequately controlled at all.  He is now on high-dose high intensity statin along with Zetia.  Plan: Continue current meds with we will check lipid panel and chemistry panel.  Adjust medications based on results.  I suspect he may require CVRR.  Only cost assessment for continued intense increased dose of Entresto and potentially PCSK9 inhibitor

## 2019-12-15 MED FILL — ATORVASTATIN 80 MG TABLET: 80 | 90 days supply | Qty: 90 | Fill #0

## 2019-12-15 MED FILL — CARVEDILOL 6.25 MG TABLET: 6.25 | 90 days supply | Qty: 180 | Fill #0

## 2019-12-15 MED FILL — EZETIMIBE 10 MG TABS: 10 | 90 days supply | Qty: 90 | Fill #0

## 2019-12-23 ENCOUNTER — Ambulatory Visit (HOSPITAL_COMMUNITY): Payer: BC Managed Care – PPO | Attending: Cardiovascular Disease

## 2019-12-23 ENCOUNTER — Other Ambulatory Visit: Payer: Self-pay

## 2019-12-23 DIAGNOSIS — I420 Dilated cardiomyopathy: Secondary | ICD-10-CM | POA: Diagnosis not present

## 2019-12-23 DIAGNOSIS — I2510 Atherosclerotic heart disease of native coronary artery without angina pectoris: Secondary | ICD-10-CM | POA: Diagnosis not present

## 2019-12-23 DIAGNOSIS — I252 Old myocardial infarction: Secondary | ICD-10-CM | POA: Insufficient documentation

## 2019-12-23 DIAGNOSIS — Z9861 Coronary angioplasty status: Secondary | ICD-10-CM | POA: Insufficient documentation

## 2019-12-23 LAB — ECHOCARDIOGRAM COMPLETE
Area-P 1/2: 4.49 cm2
S' Lateral: 5.3 cm

## 2019-12-23 MED ORDER — PERFLUTREN LIPID MICROSPHERE
1.0000 mL | INTRAVENOUS | Status: AC | PRN
  Administered 2019-12-23: 2 mL via INTRAVENOUS

## 2019-12-24 ENCOUNTER — Telehealth: Payer: Self-pay | Admitting: *Deleted

## 2019-12-24 NOTE — Telephone Encounter (Signed)
Left message to call back  would like to  schedule an appointment to follow up echo

## 2019-12-24 NOTE — Telephone Encounter (Signed)
-----   Message from Marykay Lex, MD sent at 12/23/2019  5:21 PM EDT ----- Echocardiogram result: Pump function definitely has improved since the most recent hospitalization with an ejection fraction going from 20-25% up to 30-35%.  Previously recorded pump function was 35 to 40%.  This number is right in the cutoff zone to consider the possibility of needing a defibrillator.  Would like to see him back either myself or APP to discuss reduced EF with ischemic cardiomyopathy and possible referral for EP to consider ICD.  Bryan Lemma, MD

## 2019-12-25 NOTE — Telephone Encounter (Signed)
The patient has been notified of the result and verbalized understanding.  All questions (if any) were answered. F/u appointment schedule for 10 28/21 at 9:40 am Tobin Chad, RN 12/25/2019 9:00 AM

## 2019-12-30 MED FILL — SPIRONOLACTONE 25 MG TABS: 25 | 90 days supply | Qty: 90 | Fill #0

## 2020-01-14 ENCOUNTER — Ambulatory Visit: Payer: BC Managed Care – PPO | Admitting: Cardiology

## 2020-04-08 ENCOUNTER — Ambulatory Visit: Payer: BC Managed Care – PPO | Admitting: Cardiology

## 2020-04-26 ENCOUNTER — Ambulatory Visit: Payer: Self-pay | Admitting: Cardiology

## 2020-05-12 ENCOUNTER — Ambulatory Visit: Payer: Self-pay | Admitting: Cardiology

## 2020-10-05 ENCOUNTER — Emergency Department (HOSPITAL_BASED_OUTPATIENT_CLINIC_OR_DEPARTMENT_OTHER)
Admission: EM | Admit: 2020-10-05 | Discharge: 2020-10-05 | Disposition: A | Discharge status: Home / Self Care | Payer: Self-pay | Attending: Emergency Medicine | Admitting: Emergency Medicine

## 2020-10-05 ENCOUNTER — Encounter (HOSPITAL_BASED_OUTPATIENT_CLINIC_OR_DEPARTMENT_OTHER): Payer: Self-pay | Admitting: *Deleted

## 2020-10-05 ENCOUNTER — Other Ambulatory Visit: Payer: Self-pay

## 2020-10-05 DIAGNOSIS — E119 Type 2 diabetes mellitus without complications: Secondary | ICD-10-CM | POA: Insufficient documentation

## 2020-10-05 DIAGNOSIS — Z79899 Other long term (current) drug therapy: Secondary | ICD-10-CM | POA: Insufficient documentation

## 2020-10-05 DIAGNOSIS — K0889 Other specified disorders of teeth and supporting structures: Secondary | ICD-10-CM | POA: Insufficient documentation

## 2020-10-05 DIAGNOSIS — K061 Gingival enlargement: Secondary | ICD-10-CM | POA: Insufficient documentation

## 2020-10-05 DIAGNOSIS — I2510 Atherosclerotic heart disease of native coronary artery without angina pectoris: Secondary | ICD-10-CM | POA: Insufficient documentation

## 2020-10-05 DIAGNOSIS — Z20822 Contact with and (suspected) exposure to covid-19: Secondary | ICD-10-CM | POA: Insufficient documentation

## 2020-10-05 DIAGNOSIS — R519 Headache, unspecified: Secondary | ICD-10-CM | POA: Insufficient documentation

## 2020-10-05 DIAGNOSIS — Z7982 Long term (current) use of aspirin: Secondary | ICD-10-CM | POA: Insufficient documentation

## 2020-10-05 DIAGNOSIS — I5042 Chronic combined systolic (congestive) and diastolic (congestive) heart failure: Secondary | ICD-10-CM | POA: Insufficient documentation

## 2020-10-05 DIAGNOSIS — Z7902 Long term (current) use of antithrombotics/antiplatelets: Secondary | ICD-10-CM | POA: Insufficient documentation

## 2020-10-05 DIAGNOSIS — R0981 Nasal congestion: Secondary | ICD-10-CM | POA: Insufficient documentation

## 2020-10-05 DIAGNOSIS — Z87891 Personal history of nicotine dependence: Secondary | ICD-10-CM | POA: Insufficient documentation

## 2020-10-05 DIAGNOSIS — J45909 Unspecified asthma, uncomplicated: Secondary | ICD-10-CM | POA: Insufficient documentation

## 2020-10-05 DIAGNOSIS — I110 Hypertensive heart disease with heart failure: Secondary | ICD-10-CM | POA: Insufficient documentation

## 2020-10-05 DIAGNOSIS — Z955 Presence of coronary angioplasty implant and graft: Secondary | ICD-10-CM | POA: Insufficient documentation

## 2020-10-05 LAB — RESP PANEL BY RT-PCR (FLU A&B, COVID) ARPGX2
Influenza A by PCR: NEGATIVE
Influenza B by PCR: NEGATIVE
SARS Coronavirus 2 by RT PCR: NEGATIVE

## 2020-10-05 MED ORDER — CLINDAMYCIN HCL 150 MG PO CAPS
300.0000 mg | ORAL_CAPSULE | Freq: Once | ORAL | Status: AC
  Administered 2020-10-05: 300 mg via ORAL
  Filled 2020-10-05: qty 2

## 2020-10-05 MED ORDER — CLINDAMYCIN HCL 150 MG PO CAPS: 300.0000 mg | ORAL_CAPSULE | Freq: Three times a day (TID) | ORAL | 0 refills | Status: AC

## 2020-10-05 MED ORDER — LIDOCAINE VISCOUS HCL 2 % MT SOLN: 15.0000 mL | OROMUCOSAL | 0 refills | Status: DC | PRN

## 2020-10-05 MED ORDER — LIDOCAINE VISCOUS HCL 2 % MT SOLN
15.0000 mL | Freq: Once | OROMUCOSAL | Status: AC
  Administered 2020-10-05: 15 mL via OROMUCOSAL
  Filled 2020-10-05: qty 15

## 2020-10-05 NOTE — Discharge Instructions (Addendum)
Call one of the dentists offices provided to schedule an appointment for re-evaluation and further management within the next 48 hours.   We have prescribed you clindamycin which is an antibiotic to treat the infection and viscous lidocaine to help with pain.  Continue take Tylenol per over-the-counter dosing.  Please take all of your antibiotics until finished. You may develop abdominal discomfort or diarrhea from the antibiotic.  You may help offset this with probiotics which you can buy at the store (ask your pharmacist if unable to find) or get probiotics in the form of eating yogurt. Do not eat or take the probiotics until 2 hours after your antibiotic. If you are unable to tolerate these side effects follow-up with your primary care provider or return to the emergency department.   If you begin to experience any blistering, rashes, swelling, or difficulty breathing seek medical care for evaluation of potentially more serious side effects.   We have prescribed you new medication(s) today. Discuss the medications prescribed today with your pharmacist as they can have adverse effects and interactions with your other medicines including over the counter and prescribed medications. Seek medical evaluation if you start to experience new or abnormal symptoms after taking one of these medicines, seek care immediately if you start to experience difficulty breathing, feeling of your throat closing, facial swelling, or rash as these could be indications of a more serious allergic reaction  If you start to experience and new or worsening symptoms return to the emergency department. If you start to experience fever, chills, neck stiffness/pain, or inability to move your neck or open your mouth come back to the emergency department immediately.   We have also tested you for COVID-19 and the flu we will call you if these results are positive.  Please follow-up current CDC guidelines of positive.  Please also  follow-up with your primary care provider within 1 week.

## 2020-10-05 NOTE — ED Provider Notes (Signed)
[facility] EMERGENCY DEPARTMENT Provider Note   CSN: [id number] Arrival date & time: 10/05/20  2008     History Chief Complaint  Patient presents with   Dental Pain    Reginald Fernandez is a 40 y.o. male with a history of CAD, hypertension, hyperlipidemia, migraines, and dilated cardiomyopathy who presents to the emergency department with complaints of dental pain for the past 3 to 4 days.  Patient has dental pain is to the right upper molar region, it is constant, no alleviating or aggravating factors.  He has associated headache as well as some chills and nasal congestion.  He knows that he has a bad tooth in this area.  Denies fever, visual disturbance, numbness, weakness, dysphagia, cough, dyspnea, or neck stiffness.  HPI     Past Medical History:  Diagnosis Date   CAD S/P percutaneous coronary angioplasty 11/20/2013   a. 09/2009: EKG with Inf STEMI - no obstructive CAD;  b. 11/2013 Inflat STEMI/PCI: LM nl, LAD 20p, D1 sm - diff dzs, D2 large - nl, LCX 95-99, OM1 100 (3.5x38 Promus Premier DES), RCA 95-60m, 80d (3.0x20 and 3.0x16 Promus Premier DES') - normal EF;  c. NSTEMI 5/18 - patent stents, otw minimal CAD.- EF by Echo 35-40%; d. NSTEMI 10/2018 - ? culprit - Small branch of  D2,Med Rx,.  EF by Echo ~25%   Cardiomyopathy, ischemic 07/2016   h/o Inf STEMI 11/2013 - EF was "Normal"; b) NSTEMI 07/2016 (NO Cultprit on Cath) - Echo EF 35-40% (Basal Ant-Lat HK, basal-mid Inferolateral & apical Akinesis); c) NSTEMI 10/2018 -felt to be occluded small branch of D2, Echo EF further decreased to 20-25%.    Essential hypertension    Hyperlipidemia with target LDL less than 100    Marijuana abuse    Migraine    Morbid obesity (HCC)    ST-segment elevation myocardial infarction (STEMI) of inferior wall (HCC) 11/20/2013    Patient Active Problem List   Diagnosis Date Noted   Educated about COVID-19 virus infection 12/06/2019   Prediabetes 10/25/2018   Dilated cardiomyopathy (HCC)  08/01/2016   Chronic combined systolic and diastolic heart failure (HCC) 08/01/2016   NSTEMI (non-ST elevated myocardial infarction) (HCC) 07/31/2016   Snoring 02/22/2014   Daytime somnolence 02/22/2014   Snores 11/23/2013   Marijuana abuse    Morbid obesity (HCC)    History of acute inferior wall MI 11/20/2013   ST elevation myocardial infarction (STEMI) of true posterior wall, subsequent episode of care (HCC) 11/20/2013   CAD S/P percutaneous coronary angioplasty: DES PCI -Cx-OM1, RCA x 2 11/20/2013    Class: Status post   Essential hypertension    Hyperlipidemia LDL goal <70    HYPERGLYCEMIA 10/25/2009   ASTHMA 10/24/2009    Past Surgical History:  Procedure Laterality Date   LEFT HEART CATH AND CORONARY ANGIOGRAPHY N/A 08/01/2016   Procedure: Left Heart Cath and Coronary Angiography;  Surgeon: Tonny Bollman, MD;  Location: [facility] INVASIVE CV LAB;;  Stable 2 V CAD - patent RCA & OM stents. Diffuse, non-obstructive CAD elsewhere. Moderately elevated LVEDP --> No culprit lesion  => restarted Plavix & titrate Med Rx.   LEFT HEART CATH AND CORONARY ANGIOGRAPHY N/A 10/24/2018   Procedure: LEFT HEART CATH AND CORONARY ANGIOGRAPHY;  Surgeon: Lennette Bihari, MD;  Location: [facility] INVASIVE CV LAB;  60% small D1, D2 has 3 branches 1 of which is small and appears to be 100%, LCx-OM widely patent with mild 55% stenosis in the jailed AV groove LCx.  RCA has mild luminal irregularities 30% proximal with widely patent stents in the mid and distal RCA. (Culprit 100% small branch of D2)   LEFT HEART CATH AND CORONARY ANGIOGRAPHY  09/2009   (Presumably in the setting of STEMI) mild RCA luminal Irregularities - MIld INf HK -- Med Rx; b)   LEFT HEART CATHETERIZATION WITH CORONARY ANGIOGRAM N/A 11/20/2013   Procedure: LEFT HEART CATHETERIZATION WITH CORONARY ANGIOGRAM;  Surgeon: Marykay Lex, MD;  Location: [facility] CATH LAB;  Service: Cardiovascular;   Inf STEMI: OM1 100%, m-dRCA 95-99% thrombotic mRCA with tandem 80%  -- PCI, LAD & Cx relatively normal.   PERCUTANEOUS CORONARY STENT INTERVENTION (PCI-S)  11/20/2013   a) PCI - OM1 Promus Premier DES 3.5 mm x 38 mm, m-d RCA Promus Premier DES  3.0 mm x 20 mm & 3.0 mm x 16 mm   TRANSTHORACIC ECHOCARDIOGRAM  11/20/2013   LV EF 55-60%. No regional wall motion abnormality.  Normal valves    TRANSTHORACIC ECHOCARDIOGRAM  08/11/2016   (Non-STEMI-no culprit lesion): Moderately dilated LV.  Basal anterolateral HK with basal-mid inferolateral akinesis.  Mild-apical inferior akinesis.  EF 35 to 40%.  GRII DD.  Mild biatrial dilation.  Normal RV size and function. Mostly normal valves.   TRANSTHORACIC ECHOCARDIOGRAM  10/24/2018   (Non-STEMI-culprit lesion was 1 of 3 small 2 branches.)  EF 20-25%.  Mild mildly dilated LV.  GR 2 DD.  Mild biatrial dilation.  Normal PA pressures.       Family History  Problem Relation Age of Onset   Heart attack Father 37       2 MIs by age 48 -- CABG   Hypertension Father    Heart failure Father    Hyperlipidemia Father    Hypertension Mother     Social History   Tobacco Use   Smoking status: Former    Types: Cigarettes    Quit date: 09/2016    Years since quitting: 4.0   Smokeless tobacco: Never  Vaping Use   Vaping Use: Some days  Substance Use Topics   Alcohol use: No    Alcohol/week: 0.0 standard drinks   Drug use: No    Home Medications Prior to Admission medications   Medication Sig Start Date End Date Taking? Authorizing Provider  acetaminophen (TYLENOL) 500 MG tablet Take 500-1,000 mg by mouth every 6 (six) hours as needed for moderate pain or headache.    [provider]  aspirin 81 MG tablet Take 81 mg by mouth daily.     [provider]  atorvastatin (LIPITOR) 80 MG tablet Take 1 tablet (80 mg total) by mouth daily at 6 PM. NEED OV. 12/04/19   Marykay Lex, MD  carvedilol (COREG) 6.25 MG tablet TAKE 1 TABLET BY MOUTH TWO TIMES DAILY WITH A MEAL 12/04/19   Marykay Lex, MD   clopidogrel (PLAVIX) 75 MG tablet Take 1 tablet (75 mg total) by mouth daily. 12/04/19   Marykay Lex, MD  ezetimibe (ZETIA) 10 MG tablet Take 1 tablet (10 mg total) by mouth daily. 12/04/19   Marykay Lex, MD  nitroGLYCERIN (NITROSTAT) 0.4 MG SL tablet Place 1 tablet (0.4 mg total) under the tongue every 5 (five) minutes as needed for chest pain. 12/04/19   Marykay Lex, MD  sacubitril-valsartan (ENTRESTO) 24-26 MG Take 1 tablet by mouth 2 (two) times daily. 12/04/19   Marykay Lex, MD  spironolactone (ALDACTONE) 25 MG tablet TAKE 1 TABLET BY MOUTH DAILY 12/04/19  12/03/20  Marykay Lex, MD    Allergies    Penicillins  Review of Systems   Review of Systems  Constitutional:  Positive for chills. Negative for fever.  HENT:  Positive for congestion and dental problem. Negative for trouble swallowing and voice change.   Respiratory:  Negative for cough and shortness of breath.   Cardiovascular:  Negative for chest pain.  Gastrointestinal:  Negative for diarrhea and vomiting.  Neurological:  Positive for headaches. Negative for syncope, weakness, light-headedness and numbness.  All other systems reviewed and are negative.  Physical Exam Updated Vital Signs BP (!) 134/102 (BP Location: Right Arm)   Pulse 89   Temp 99 F (37.2 C) (Oral)   Resp 18   Ht 6\' 2"  (1.88 m)   Wt 123.4 kg   SpO2 100%   BMI 34.92 kg/m   Physical Exam Vitals and nursing note reviewed.  Constitutional:      General: He is not in acute distress.    Appearance: He is well-developed. He is not toxic-appearing.  HENT:     Head: Normocephalic and atraumatic.     Comments: No tenderness over the temporal artery regions.    Right Ear: Ear canal normal. Tympanic membrane is not perforated, erythematous, retracted or bulging.     Left Ear: Ear canal normal. Tympanic membrane is not perforated, erythematous, retracted or bulging.     Ears:     Comments: No mastoid erythema/swellng/tenderness.      Nose: Nose normal.     Right Sinus: No maxillary sinus tenderness or frontal sinus tenderness.     Left Sinus: No maxillary sinus tenderness or frontal sinus tenderness.     Mouth/Throat:     Pharynx: Oropharynx is clear. Uvula midline. No oropharyngeal exudate or posterior oropharyngeal erythema.      Comments: Posterior oropharynx is symmetric appearing. Patient tolerating own secretions without difficulty. No trismus. No drooling. No hot potato voice. No swelling beneath the tongue, submandibular compartment is soft.  Eyes:     General:        Right eye: No discharge.        Left eye: No discharge.     Extraocular Movements: Extraocular movements intact.     Conjunctiva/sclera: Conjunctivae normal.     Pupils: Pupils are equal, round, and reactive to light.  Cardiovascular:     Rate and Rhythm: Normal rate and regular rhythm.  Pulmonary:     Effort: Pulmonary effort is normal. No respiratory distress.     Breath sounds: Normal breath sounds. No wheezing, rhonchi or rales.  Abdominal:     General: There is no distension.     Palpations: Abdomen is soft.     Tenderness: There is no abdominal tenderness.  Musculoskeletal:     Cervical back: Normal range of motion and neck supple. No rigidity.  Lymphadenopathy:     Cervical: No cervical adenopathy.  Skin:    General: Skin is warm and dry.     Findings: No rash.  Neurological:     Mental Status: He is alert.     Comments: Alert.  CN III through XII grossly intact.  Extraocular movements intact.  Pupils equal round and reactive to light.  Clear speech.  Sensation and strength grossly intact x4.  Ambulatory.  Psychiatric:        Behavior: Behavior normal.        Thought Content: Thought content normal.    ED Results / Procedures / Treatments   Labs (  all labs ordered are listed, but only abnormal results are displayed) Labs Reviewed - No data to display  EKG None  Radiology No results found.  Procedures Procedures    Medications Ordered in ED Medications - No data to display  ED Course  I have reviewed the triage vital signs and the nursing notes.  Pertinent labs & imaging results that were available during my care of the patient were reviewed by me and considered in my medical decision making (see chart for details).    MDM Rules/Calculators/A&P                           Patient presents to the ED with complaints of dental pain, headache, congestion, & Chills.  Patient is nontoxic, resting comfortably, blood pressure mildly elevated, low suspicion for hypertensive emergency.  Additional history obtained:  Additional history obtained from chart review & nursing note review.   Lab Tests:  I COVID-19 testing ordered and pending.  ED Course:  Exam is without signs of AOM, AOE, or mastoiditis.  No sinus tenderness. No meningeal signs. Lungs are CTA without focal adventitious sounds,, doubt CAP.  Patient with right upper molar gingival swelling and tenderness to palpation.  No gross abscess.  Exam is not consistent with Ludwigs angina or deep space infection at this time.  He has no focal neurologic deficits no nuchal rigidity in terms of his headache.  Will obtain COVID/flu swab.  Will treat for dental infection with clindamycin as patient is penicillin allergic. Dental & PCP follow up. I discussed treatment plan, need for follow-up, and return precautions with the patient. Provided opportunity for questions, patient confirmed understanding and is in agreement with plan.   Portions of this note were generated with Scientist, clinical (histocompatibility and immunogenetics). Dictation errors may occur despite best attempts at proofreading.   Final Clinical Impression(s) / ED Diagnoses Final diagnoses:  Pain, dental    Rx / DC Orders ED Discharge Orders          Ordered    clindamycin (CLEOCIN) 150 MG capsule  3 times daily        10/05/20 2151    lidocaine (XYLOCAINE) 2 % solution  As needed        10/05/20 2151              Desmond Lope 10/05/20 2154    Tilden Fossa, MD 10/05/20 2348

## 2020-10-05 NOTE — ED Triage Notes (Addendum)
C/o dental pain  x 2 days , also reports fever , chills , h/a

## 2021-04-23 IMAGING — DX CHEST - 2 VIEW
2 series · 2 of 2 positions shown · non-contrast
Comparison: July 31, 2016

CLINICAL DATA: Chest pain

EXAM:
CHEST - 2 VIEW

[chest pa]
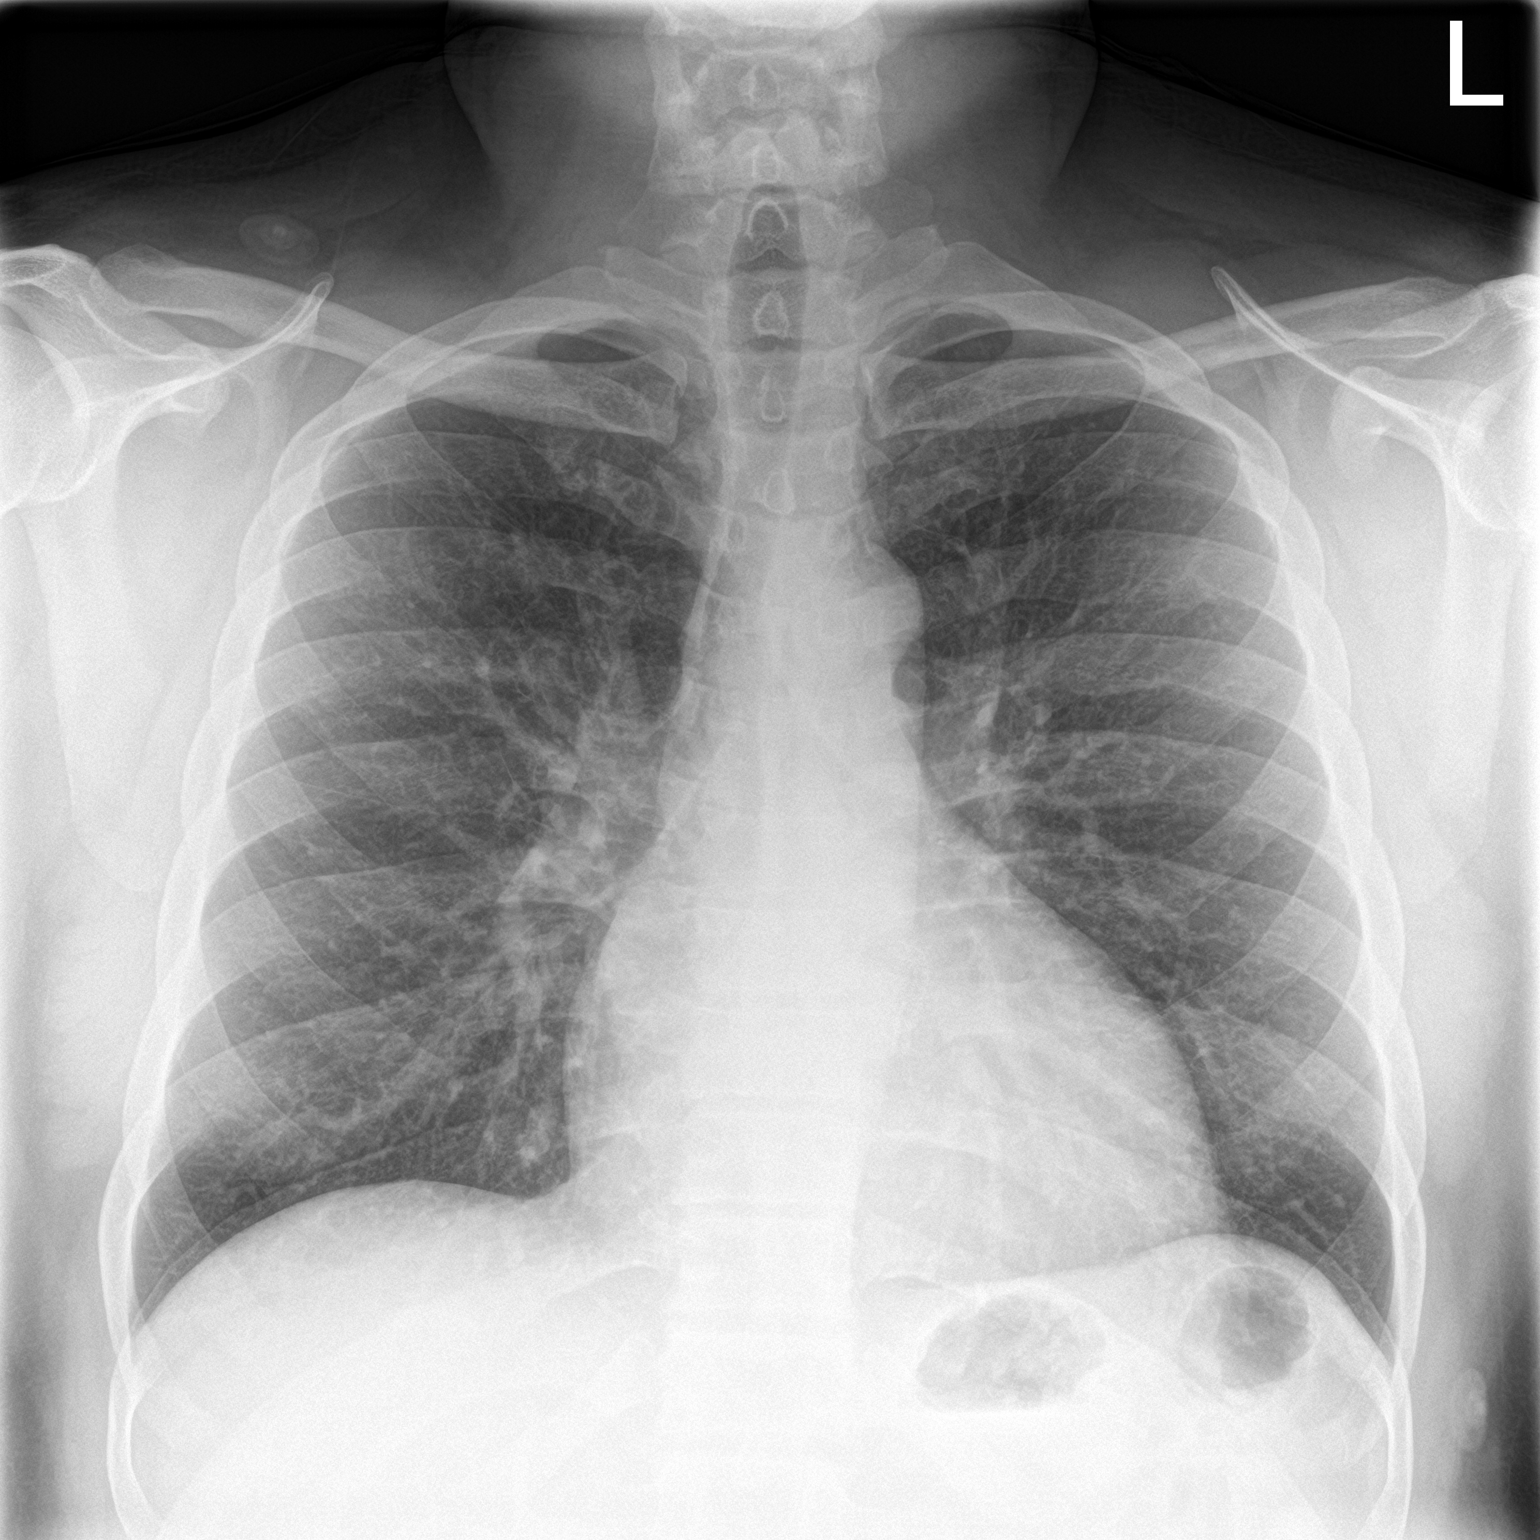

[chest lat]
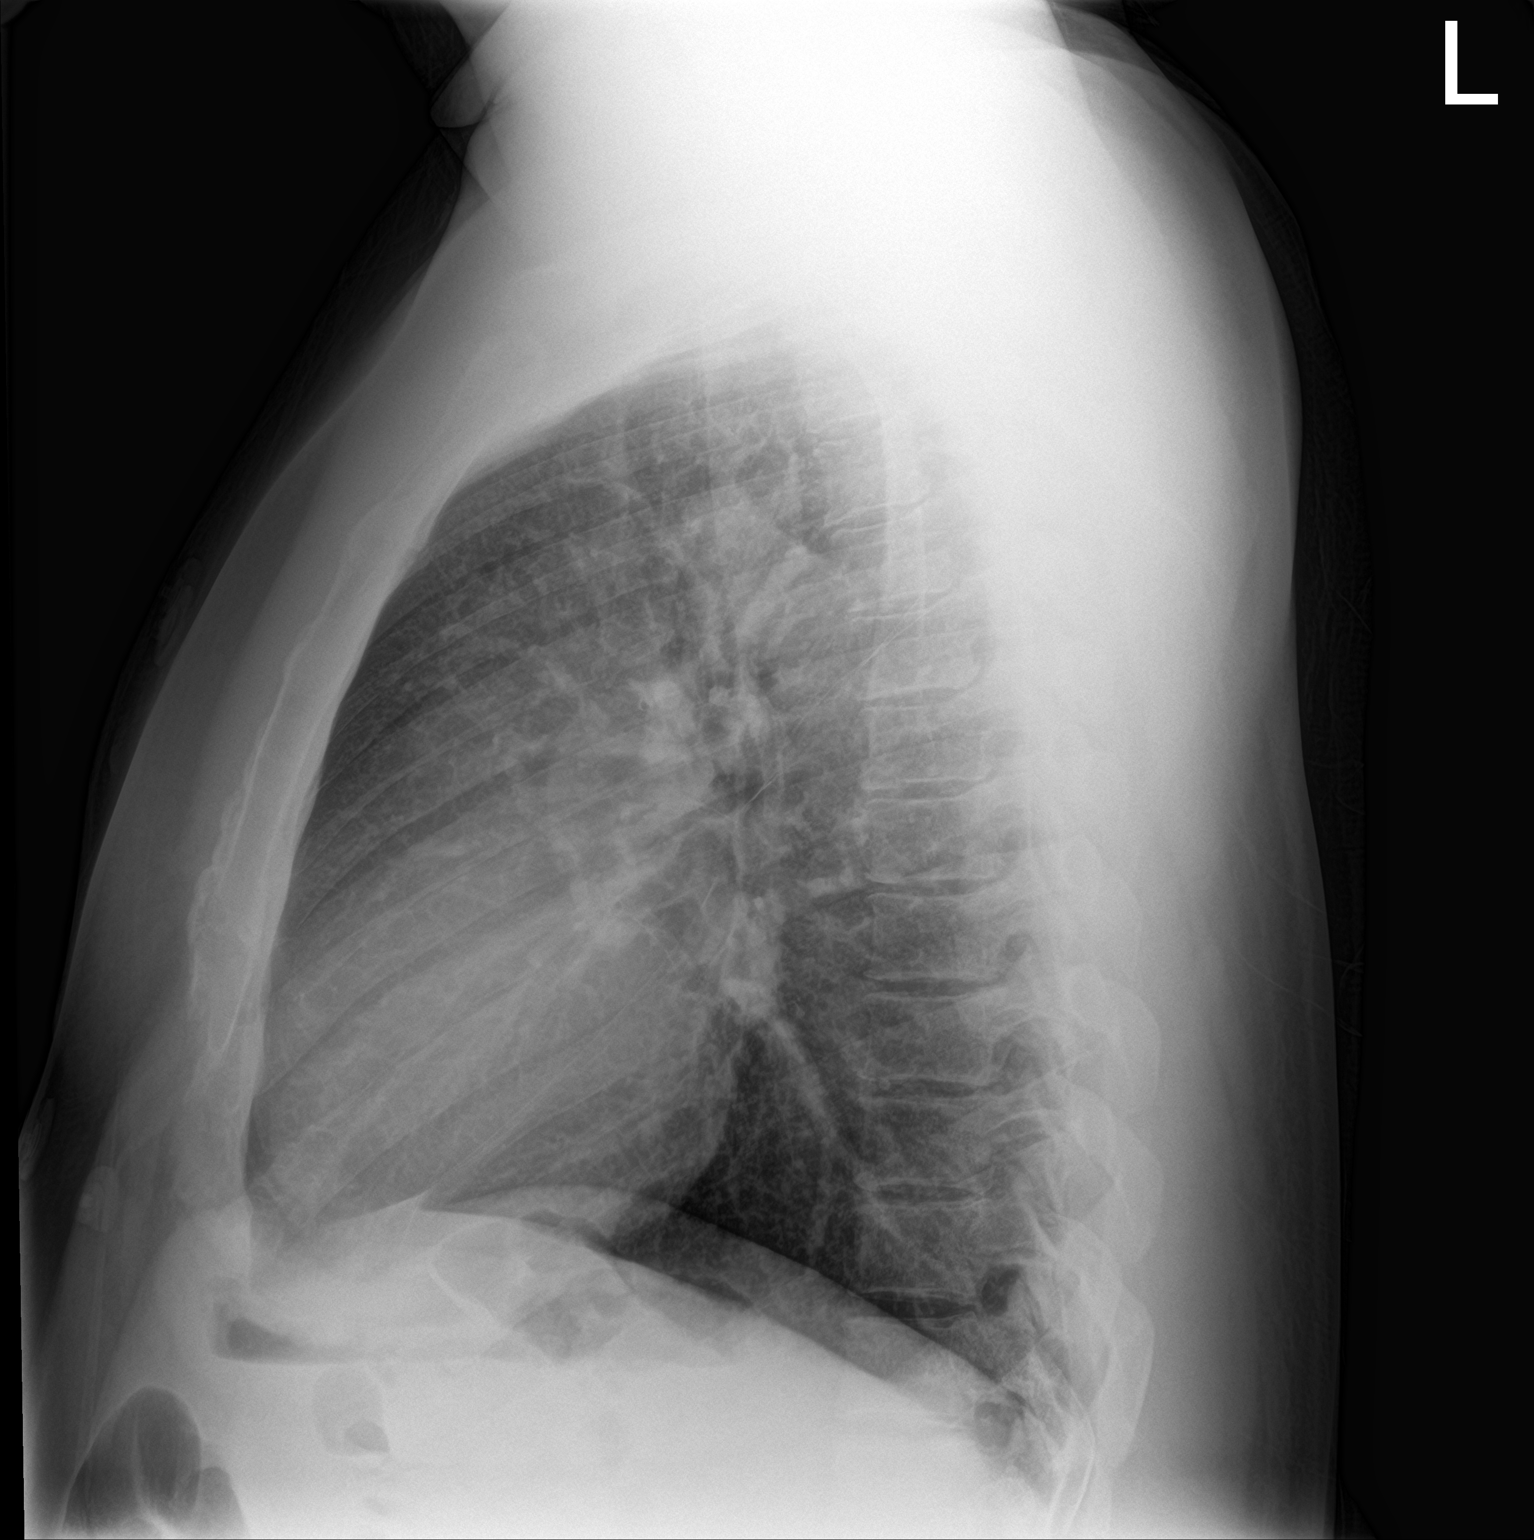

[2 of 2 positions shown; findings below may reference images not displayed]

FINDINGS: No edema or consolidation. Heart is borderline enlarged with
pulmonary vascularity normal. No adenopathy. No pneumothorax. No
bone lesions.
IMPRESSION: Borderline cardiac enlargement.  No edema or consolidation.

## 2021-10-09 ENCOUNTER — Inpatient Hospital Stay (HOSPITAL_BASED_OUTPATIENT_CLINIC_OR_DEPARTMENT_OTHER)
Admission: EM | Admit: 2021-10-09 | Discharge: 2021-10-12 | DRG: 286 | Disposition: A | Discharge status: Home / Self Care | Payer: Self-pay | Attending: Cardiology | Admitting: Cardiology

## 2021-10-09 ENCOUNTER — Emergency Department (HOSPITAL_BASED_OUTPATIENT_CLINIC_OR_DEPARTMENT_OTHER): Payer: Self-pay

## 2021-10-09 ENCOUNTER — Encounter (HOSPITAL_BASED_OUTPATIENT_CLINIC_OR_DEPARTMENT_OTHER): Payer: Self-pay | Admitting: Emergency Medicine

## 2021-10-09 ENCOUNTER — Other Ambulatory Visit: Payer: Self-pay

## 2021-10-09 DIAGNOSIS — Z7982 Long term (current) use of aspirin: Secondary | ICD-10-CM

## 2021-10-09 DIAGNOSIS — Z83438 Family history of other disorder of lipoprotein metabolism and other lipidemia: Secondary | ICD-10-CM

## 2021-10-09 DIAGNOSIS — E876 Hypokalemia: Secondary | ICD-10-CM | POA: Diagnosis not present

## 2021-10-09 DIAGNOSIS — Z6832 Body mass index (BMI) 32.0-32.9, adult: Secondary | ICD-10-CM

## 2021-10-09 DIAGNOSIS — I5042 Chronic combined systolic (congestive) and diastolic (congestive) heart failure: Secondary | ICD-10-CM

## 2021-10-09 DIAGNOSIS — G478 Other sleep disorders: Secondary | ICD-10-CM | POA: Diagnosis present

## 2021-10-09 DIAGNOSIS — K760 Fatty (change of) liver, not elsewhere classified: Secondary | ICD-10-CM | POA: Diagnosis present

## 2021-10-09 DIAGNOSIS — I509 Heart failure, unspecified: Secondary | ICD-10-CM

## 2021-10-09 DIAGNOSIS — I2510 Atherosclerotic heart disease of native coronary artery without angina pectoris: Secondary | ICD-10-CM | POA: Diagnosis present

## 2021-10-09 DIAGNOSIS — F17290 Nicotine dependence, other tobacco product, uncomplicated: Secondary | ICD-10-CM | POA: Diagnosis present

## 2021-10-09 DIAGNOSIS — I110 Hypertensive heart disease with heart failure: Principal | ICD-10-CM | POA: Diagnosis present

## 2021-10-09 DIAGNOSIS — Z91148 Patient's other noncompliance with medication regimen for other reason: Secondary | ICD-10-CM

## 2021-10-09 DIAGNOSIS — I252 Old myocardial infarction: Secondary | ICD-10-CM

## 2021-10-09 DIAGNOSIS — Z8249 Family history of ischemic heart disease and other diseases of the circulatory system: Secondary | ICD-10-CM

## 2021-10-09 DIAGNOSIS — R778 Other specified abnormalities of plasma proteins: Secondary | ICD-10-CM

## 2021-10-09 DIAGNOSIS — Z955 Presence of coronary angioplasty implant and graft: Secondary | ICD-10-CM

## 2021-10-09 DIAGNOSIS — I493 Ventricular premature depolarization: Secondary | ICD-10-CM | POA: Diagnosis present

## 2021-10-09 DIAGNOSIS — Z880 Allergy status to penicillin: Secondary | ICD-10-CM

## 2021-10-09 DIAGNOSIS — Z7902 Long term (current) use of antithrombotics/antiplatelets: Secondary | ICD-10-CM

## 2021-10-09 DIAGNOSIS — E785 Hyperlipidemia, unspecified: Secondary | ICD-10-CM | POA: Diagnosis present

## 2021-10-09 DIAGNOSIS — I272 Pulmonary hypertension, unspecified: Secondary | ICD-10-CM | POA: Diagnosis present

## 2021-10-09 DIAGNOSIS — R600 Localized edema: Secondary | ICD-10-CM

## 2021-10-09 DIAGNOSIS — R0989 Other specified symptoms and signs involving the circulatory and respiratory systems: Secondary | ICD-10-CM | POA: Diagnosis present

## 2021-10-09 DIAGNOSIS — Z79899 Other long term (current) drug therapy: Secondary | ICD-10-CM

## 2021-10-09 DIAGNOSIS — I255 Ischemic cardiomyopathy: Secondary | ICD-10-CM | POA: Diagnosis present

## 2021-10-09 DIAGNOSIS — I5082 Biventricular heart failure: Secondary | ICD-10-CM | POA: Diagnosis present

## 2021-10-09 DIAGNOSIS — I5023 Acute on chronic systolic (congestive) heart failure: Secondary | ICD-10-CM | POA: Diagnosis present

## 2021-10-09 DIAGNOSIS — Z597 Insufficient social insurance and welfare support: Secondary | ICD-10-CM

## 2021-10-09 LAB — COMPREHENSIVE METABOLIC PANEL WITH GFR
ALT: 14 U/L (ref 0–44)
AST: 27 U/L (ref 15–41)
Albumin: 4.1 g/dL (ref 3.5–5.0)
Alkaline Phosphatase: 63 U/L (ref 38–126)
Anion gap: 11 (ref 5–15)
BUN: 10 mg/dL (ref 6–20)
CO2: 23 mmol/L (ref 22–32)
Calcium: 8.6 mg/dL — ABNORMAL LOW (ref 8.9–10.3)
Chloride: 102 mmol/L (ref 98–111)
Creatinine, Ser: 1.02 mg/dL (ref 0.61–1.24)
GFR, Estimated: >60 mL/min
Glucose, Bld: 144 mg/dL — ABNORMAL HIGH (ref 70–99)
Potassium: 4.2 mmol/L (ref 3.5–5.1)
Sodium: 136 mmol/L (ref 135–145)
Total Bilirubin: 2.9 mg/dL — ABNORMAL HIGH (ref 0.3–1.2)
Total Protein: 7.3 g/dL (ref 6.5–8.1)

## 2021-10-09 LAB — CBC
HCT: 43.7 % (ref 39.0–52.0)
Hemoglobin: 14.8 g/dL (ref 13.0–17.0)
MCH: 30.1 pg (ref 26.0–34.0)
MCHC: 33.9 g/dL (ref 30.0–36.0)
MCV: 88.8 fL (ref 80.0–100.0)
Platelets: 245 K/uL (ref 150–400)
RBC: 4.92 MIL/uL (ref 4.22–5.81)
RDW: 13.7 % (ref 11.5–15.5)
WBC: 10.4 K/uL (ref 4.0–10.5)
nRBC: 0 % (ref 0.0–0.2)

## 2021-10-09 LAB — BASIC METABOLIC PANEL
Anion gap: 9 (ref 5–15)
BUN: 10 mg/dL (ref 6–20)
CO2: 25 mmol/L (ref 22–32)
Calcium: 8.7 mg/dL — ABNORMAL LOW (ref 8.9–10.3)
Chloride: 103 mmol/L (ref 98–111)
Creatinine, Ser: 0.94 mg/dL (ref 0.61–1.24)
GFR, Estimated: >60 mL/min (ref >60)
Glucose, Bld: 120 mg/dL — ABNORMAL HIGH (ref 70–99)
Potassium: 3.6 mmol/L (ref 3.5–5.1)
Sodium: 137 mmol/L (ref 135–145)

## 2021-10-09 LAB — LIPASE, BLOOD: Lipase: 53 U/L — ABNORMAL HIGH (ref 11–51)

## 2021-10-09 LAB — TROPONIN I (HIGH SENSITIVITY)
Troponin I (High Sensitivity): 20 ng/L — ABNORMAL HIGH
Troponin I (High Sensitivity): 18 ng/L — ABNORMAL HIGH

## 2021-10-09 LAB — BRAIN NATRIURETIC PEPTIDE: B Natriuretic Peptide: 1097.4 pg/mL — ABNORMAL HIGH (ref 0.0–100.0)

## 2021-10-09 MED ORDER — IOHEXOL 300 MG/ML  SOLN
125.0000 mL | Freq: Once | INTRAMUSCULAR | Status: AC | PRN
  Administered 2021-10-09: 125 mL via INTRAVENOUS

## 2021-10-09 MED ORDER — FUROSEMIDE 10 MG/ML IJ SOLN
40.0000 mg | Freq: Once | INTRAMUSCULAR | Status: AC
  Administered 2021-10-09: 40 mg via INTRAVENOUS
  Filled 2021-10-09: qty 4

## 2021-10-09 NOTE — ED Triage Notes (Signed)
C/o shortness of breath and chest tightness episode this past weekend. Denies current sx. NAD. Hx of asthma, and reports more flare ups recently.

## 2021-10-09 NOTE — ED Notes (Signed)
Pt breathing much better after Lasix IV

## 2021-10-09 NOTE — ED Provider Notes (Signed)
[facility] EMERGENCY DEPARTMENT Provider Note   CSN: [id number] Arrival date & time: 10/09/21  1825     History  Chief Complaint  Patient presents with   Shortness of Breath    Reginald Fernandez is a 41 y.o. male.  Patient is a 41 year old male with past medical history of coronary artery disease with coronary angioplasty congestive heart failure presenting for complaints of shortness of breath.  Patient admits to progressive chest tightness and shortness of breath over the past several weeks.  Patient does sleep on 2 pillows at night.  Admits to minimal bilateral lower extremity swelling.  Admits to morning cough with productive sputum.  History of smoking no longer smoking.  Is any fevers or chills.  The history is provided by the patient. No language interpreter was used.  Shortness of Breath Associated symptoms: no abdominal pain, no chest pain, no cough, no ear pain, no fever, no rash, no sore throat and no vomiting        Home Medications Prior to Admission medications   Medication Sig Start Date End Date Taking? Authorizing Provider  acetaminophen (TYLENOL) 500 MG tablet Take 500-1,000 mg by mouth every 6 (six) hours as needed for moderate pain or headache.    [provider]  aspirin 81 MG tablet Take 81 mg by mouth daily.     [provider]  atorvastatin (LIPITOR) 80 MG tablet Take 1 tablet (80 mg total) by mouth daily at 6 PM. NEED OV. 12/04/19   Marykay Lex, MD  carvedilol (COREG) 6.25 MG tablet TAKE 1 TABLET BY MOUTH TWO TIMES DAILY WITH A MEAL 12/04/19   Marykay Lex, MD  clopidogrel (PLAVIX) 75 MG tablet Take 1 tablet (75 mg total) by mouth daily. 12/04/19   Marykay Lex, MD  ezetimibe (ZETIA) 10 MG tablet Take 1 tablet (10 mg total) by mouth daily. 12/04/19   Marykay Lex, MD  lidocaine (XYLOCAINE) 2 % solution Use as directed 15 mLs in the mouth or throat as needed for mouth pain. 10/05/20   Petrucelli, Samantha R, PA-C   nitroGLYCERIN (NITROSTAT) 0.4 MG SL tablet Place 1 tablet (0.4 mg total) under the tongue every 5 (five) minutes as needed for chest pain. 12/04/19   Marykay Lex, MD  sacubitril-valsartan (ENTRESTO) 24-26 MG Take 1 tablet by mouth 2 (two) times daily. 12/04/19   Marykay Lex, MD  spironolactone (ALDACTONE) 25 MG tablet TAKE 1 TABLET BY MOUTH DAILY 12/04/19 12/03/20  Marykay Lex, MD      Allergies    Penicillins    Review of Systems   Review of Systems  Constitutional:  Negative for chills and fever.  HENT:  Negative for ear pain and sore throat.   Eyes:  Negative for pain and visual disturbance.  Respiratory:  Positive for chest tightness and shortness of breath. Negative for cough.   Cardiovascular:  Positive for leg swelling. Negative for chest pain and palpitations.  Gastrointestinal:  Negative for abdominal pain and vomiting.  Genitourinary:  Negative for dysuria and hematuria.  Musculoskeletal:  Negative for arthralgias and back pain.  Skin:  Negative for color change and rash.  Neurological:  Negative for seizures and syncope.  All other systems reviewed and are negative.   Physical Exam Updated Vital Signs BP 110/85 (BP Location: Right Arm)   Pulse 99   Temp 99.1 F (37.3 C) (Oral)   Resp 18   Ht 6\' 2"  (1.88 m)   Wt  118.8 kg   SpO2 98%   BMI 33.64 kg/m  Physical Exam Vitals and nursing note reviewed.  Constitutional:      General: He is not in acute distress.    Appearance: He is well-developed.  HENT:     Head: Normocephalic and atraumatic.  Eyes:     Conjunctiva/sclera: Conjunctivae normal.  Cardiovascular:     Rate and Rhythm: Normal rate and regular rhythm.     Heart sounds: No murmur heard. Pulmonary:     Effort: Pulmonary effort is normal. No respiratory distress.     Breath sounds: Normal breath sounds.  Abdominal:     Palpations: Abdomen is soft.     Tenderness: There is no abdominal tenderness.  Musculoskeletal:        General: No  swelling.     Cervical back: Neck supple.     Right lower leg: 2+ Edema present.     Left lower leg: 2+ Edema present.  Skin:    General: Skin is warm and dry.     Capillary Refill: Capillary refill takes less than 2 seconds.  Neurological:     Mental Status: He is alert.  Psychiatric:        Mood and Affect: Mood normal.     ED Results / Procedures / Treatments   Labs (all labs ordered are listed, but only abnormal results are displayed) Labs Reviewed  BASIC METABOLIC PANEL - Abnormal; Notable for the following components:      Result Value   Glucose, Bld 120 (*)    Calcium 8.7 (*)    All other components within normal limits  BRAIN NATRIURETIC PEPTIDE - Abnormal; Notable for the following components:   B Natriuretic Peptide 1,097.4 (*)    All other components within normal limits  COMPREHENSIVE METABOLIC PANEL - Abnormal; Notable for the following components:   Glucose, Bld 144 (*)    Calcium 8.6 (*)    Total Bilirubin 2.9 (*)    All other components within normal limits  LIPASE, BLOOD - Abnormal; Notable for the following components:   Lipase 53 (*)    All other components within normal limits  TROPONIN I (HIGH SENSITIVITY) - Abnormal; Notable for the following components:   Troponin I (High Sensitivity) 20 (*)    All other components within normal limits  TROPONIN I (HIGH SENSITIVITY) - Abnormal; Notable for the following components:   Troponin I (High Sensitivity) 18 (*)    All other components within normal limits  CBC    EKG None  Radiology CT ABDOMEN PELVIS W CONTRAST  Result Date: 10/09/2021 CLINICAL DATA:  Epigastric discomfort.  History of marijuana use. EXAM: CT ABDOMEN AND PELVIS WITH CONTRAST TECHNIQUE: Multidetector CT imaging of the abdomen and pelvis was performed using the standard protocol following bolus administration of intravenous contrast. RADIATION DOSE REDUCTION: This exam was performed according to the departmental dose-optimization program  which includes automated exposure control, adjustment of the mA and/or kV according to patient size and/or use of iterative reconstruction technique. CONTRAST:  OMNIPAQUE IOHEXOL 300 MG/ML  SOLN COMPARISON:  None Available. FINDINGS: Lower chest: Lung bases are clear.  Small esophageal hiatal hernia. Hepatobiliary: Diffuse fatty infiltration suggested in the liver. Mild gallbladder wall edema, possibly related to liver disease. No gallstones. No bile duct dilatation. Pancreas: Unremarkable. No pancreatic ductal dilatation or surrounding inflammatory changes. Spleen: Normal in size without focal abnormality. Adrenals/Urinary Tract: Adrenal glands are unremarkable. Kidneys are normal, without renal calculi, focal lesion, or hydronephrosis. Bladder  is unremarkable. Stomach/Bowel: Stomach is within normal limits. Appendix appears normal. No evidence of bowel wall thickening, distention, or inflammatory changes. Vascular/Lymphatic: Aortic atherosclerosis. No enlarged abdominal or pelvic lymph nodes. Reproductive: Prostate gland is enlarged. Other: No abdominal wall hernia or abnormality. No abdominopelvic ascites. Musculoskeletal: No acute or significant osseous findings. IMPRESSION: 1. Diffuse fatty infiltration of the liver. 2. Mild gallbladder wall edema possibly related to liver disease. No gallstones. 3. Aortic atherosclerosis. Electronically Signed   By: Burman Nieves M.D.   On: 10/09/2021 22:22   DG Chest 2 View  Result Date: 10/09/2021 CLINICAL DATA:  Shortness of breath and chest tightness episode this past weekend, history asthma, coronary artery disease post MI and coronary stenting EXAM: CHEST - 2 VIEW COMPARISON:  10/23/2018 FINDINGS: Enlargement of cardiac silhouette and pulmonary vascular congestion new since previous study. Mediastinal contours normal. Lungs clear. No pulmonary infiltrate, pleural effusion, or pneumothorax. Osseous structures unremarkable. IMPRESSION: Enlargement of cardiac  silhouette with pulmonary vascular congestion new since previous study. Electronically Signed   By: Ulyses Southward M.D.   On: 10/09/2021 19:02    Procedures Procedures    Medications Ordered in ED Medications  furosemide (LASIX) injection 40 mg (40 mg Intravenous Given 10/09/21 2054)  iohexol (OMNIPAQUE) 300 MG/ML solution 125 mL (125 mLs Intravenous Contrast Given 10/09/21 2205)    ED Course/ Medical Decision Making/ A&P                           Medical Decision Making Amount and/or Complexity of Data Reviewed Labs: ordered. Radiology: ordered.  Risk Prescription drug management.   64:80 PM 41 year old male with past medical history of coronary artery disease with coronary angioplasty congestive heart failure presenting for complaints of shortness of breath.  Patient is alert and oriented x3, no acute distress, afebrile, stable vital signs.  Physical exam demonstrates +2 bilateral lower extremity swelling.  No abnormal lung sounds.  I independently interpreted patient's labs and EKG.  Chest x-ray demonstrates pulmonary vascular congestion worsened from previous studies.  BNP in the thousands.  Concern for CHF exacerbation.  Patient's last echocardiogram was in 2021 with an EF of 30 to 35%.  Stable EKG with no ST segment elevation or depression.  Patient also endorses intermittent epigastric abdominal pain.  Lipase minimally elevated.  CAT scan demonstrates acute process.  Patient accepted by cardiologist Dr. Okey Dupre CHF exacerbation with elevated tropes likely secondary to demand ischemia.  40 mg of IV Lasix given..        Final Clinical Impression(s) / ED Diagnoses Final diagnoses:  Acute on chronic congestive heart failure, unspecified heart failure type (HCC)  Elevated troponin  Pulmonary vascular congestion  Leg edema    Rx / DC Orders ED Discharge Orders     None         Franne Forts, DO 10/09/21 2304

## 2021-10-10 ENCOUNTER — Inpatient Hospital Stay (HOSPITAL_COMMUNITY): Payer: Self-pay

## 2021-10-10 ENCOUNTER — Encounter (HOSPITAL_COMMUNITY): Payer: Self-pay | Admitting: Cardiology

## 2021-10-10 ENCOUNTER — Other Ambulatory Visit: Payer: Self-pay

## 2021-10-10 ENCOUNTER — Telehealth (HOSPITAL_COMMUNITY): Payer: Self-pay | Admitting: Pharmacy Technician

## 2021-10-10 ENCOUNTER — Other Ambulatory Visit (HOSPITAL_COMMUNITY): Payer: Self-pay

## 2021-10-10 DIAGNOSIS — I5023 Acute on chronic systolic (congestive) heart failure: Secondary | ICD-10-CM

## 2021-10-10 DIAGNOSIS — I5043 Acute on chronic combined systolic (congestive) and diastolic (congestive) heart failure: Secondary | ICD-10-CM

## 2021-10-10 LAB — ECHOCARDIOGRAM COMPLETE
AR max vel: 1.71 cm2
AV Peak grad: 6 mmHg
Ao pk vel: 1.22 m/s
Area-P 1/2: 6.65 cm2
Calc EF: 21.4 %
Height: 74 in
MV M vel: 4.27 m/s
MV Peak grad: 72.8 mmHg
S' Lateral: 6.2 cm
Single Plane A2C EF: 23.2 %
Single Plane A4C EF: 21.8 %
Weight: 4119.96 oz

## 2021-10-10 LAB — RAPID URINE DRUG SCREEN, HOSP PERFORMED
Amphetamines: NOT DETECTED
Barbiturates: NOT DETECTED
Benzodiazepines: NOT DETECTED
Cocaine: NOT DETECTED
Opiates: NOT DETECTED
Tetrahydrocannabinol: POSITIVE — AB

## 2021-10-10 LAB — BASIC METABOLIC PANEL
Anion gap: 10 (ref 5–15)
BUN: 7 mg/dL (ref 6–20)
CO2: 28 mmol/L (ref 22–32)
Calcium: 8.8 mg/dL — ABNORMAL LOW (ref 8.9–10.3)
Chloride: 102 mmol/L (ref 98–111)
Creatinine, Ser: 1.09 mg/dL (ref 0.61–1.24)
GFR, Estimated: >60 mL/min (ref >60)
Glucose, Bld: 114 mg/dL — ABNORMAL HIGH (ref 70–99)
Potassium: 3.5 mmol/L (ref 3.5–5.1)
Sodium: 140 mmol/L (ref 135–145)

## 2021-10-10 LAB — MAGNESIUM: Magnesium: 1.9 mg/dL (ref 1.7–2.4)

## 2021-10-10 LAB — HEMOGLOBIN A1C
Hgb A1c MFr Bld: 5.9 % — ABNORMAL HIGH (ref 4.8–5.6)
Mean Plasma Glucose: 122.63 mg/dL

## 2021-10-10 LAB — HIV ANTIBODY (ROUTINE TESTING W REFLEX): HIV Screen 4th Generation wRfx: NONREACTIVE

## 2021-10-10 LAB — TSH: TSH: 1.699 u[IU]/mL (ref 0.350–4.500)

## 2021-10-10 MED ORDER — FUROSEMIDE 10 MG/ML IJ SOLN
40.0000 mg | Freq: Once | INTRAMUSCULAR | Status: AC
  Administered 2021-10-10: 40 mg via INTRAVENOUS
  Filled 2021-10-10: qty 4

## 2021-10-10 MED ORDER — ASPIRIN 81 MG PO CHEW: 81.0000 mg | CHEWABLE_TABLET | Freq: Every day | ORAL | Status: DC

## 2021-10-10 MED ORDER — ASPIRIN 81 MG PO CHEW
81.0000 mg | CHEWABLE_TABLET | Freq: Every day | ORAL | Status: DC
Start: 2021-10-12 — End: 2021-10-12
  Administered 2021-10-12: 81 mg via ORAL
  Filled 2021-10-10: qty 1

## 2021-10-10 MED ORDER — SODIUM CHLORIDE 0.9 % IV SOLN
INTRAVENOUS | Status: DC
Start: 2021-10-11 — End: 2021-10-11

## 2021-10-10 MED ORDER — ATORVASTATIN CALCIUM 80 MG PO TABS
80.0000 mg | ORAL_TABLET | Freq: Every day | ORAL | Status: DC
  Administered 2021-10-10 – 2021-10-12 (×3): 80 mg via ORAL
  Filled 2021-10-10 (×3): qty 1

## 2021-10-10 MED ORDER — SODIUM CHLORIDE 0.9 % IV SOLN: 250.0000 mL | INTRAVENOUS | Status: DC | PRN

## 2021-10-10 MED ORDER — SACUBITRIL-VALSARTAN 24-26 MG PO TABS
1.0000 | ORAL_TABLET | Freq: Two times a day (BID) | ORAL | Status: DC
  Administered 2021-10-10 – 2021-10-11 (×3): 1 via ORAL
  Filled 2021-10-10 (×3): qty 1

## 2021-10-10 MED ORDER — POTASSIUM CHLORIDE CRYS ER 20 MEQ PO TBCR
40.0000 meq | EXTENDED_RELEASE_TABLET | Freq: Once | ORAL | Status: AC
  Administered 2021-10-10: 40 meq via ORAL
  Filled 2021-10-10: qty 2

## 2021-10-10 MED ORDER — PERFLUTREN LIPID MICROSPHERE
1.0000 mL | INTRAVENOUS | Status: AC | PRN
  Administered 2021-10-10: 2 mL via INTRAVENOUS

## 2021-10-10 MED ORDER — ASPIRIN 81 MG PO CHEW
324.0000 mg | CHEWABLE_TABLET | ORAL | Status: AC
  Administered 2021-10-10: 324 mg via ORAL
  Filled 2021-10-10: qty 4

## 2021-10-10 MED ORDER — SODIUM CHLORIDE 0.9% FLUSH: 3.0000 mL | INTRAVENOUS | Status: DC | PRN

## 2021-10-10 MED ORDER — SODIUM CHLORIDE 0.9% FLUSH
3.0000 mL | Freq: Two times a day (BID) | INTRAVENOUS | Status: DC
Start: 2021-10-10 — End: 2021-10-12
  Administered 2021-10-10 – 2021-10-12 (×4): 3 mL via INTRAVENOUS

## 2021-10-10 MED ORDER — SPIRONOLACTONE 12.5 MG HALF TABLET
12.5000 mg | ORAL_TABLET | Freq: Every day | ORAL | Status: DC
  Administered 2021-10-10: 12.5 mg via ORAL
  Filled 2021-10-10: qty 1

## 2021-10-10 MED ORDER — ASPIRIN 81 MG PO CHEW
81.0000 mg | CHEWABLE_TABLET | ORAL | Status: AC
  Administered 2021-10-11: 81 mg via ORAL
  Filled 2021-10-10: qty 1

## 2021-10-10 NOTE — Plan of Care (Signed)
  Problem: Education: Goal: Knowledge of General Education information will improve Description: Including pain rating scale, medication(s)/side effects and non-pharmacologic comfort measures Outcome: Progressing   Problem: Health Behavior/Discharge Planning: Goal: Ability to manage health-related needs will improve Outcome: Progressing   Problem: Clinical Measurements: Goal: Ability to maintain clinical measurements within normal limits will improve Outcome: Progressing   Problem: Clinical Measurements: Goal: Respiratory complications will improve Outcome: Progressing   

## 2021-10-10 NOTE — ED Notes (Signed)
Report given to [facility]. 

## 2021-10-10 NOTE — Telephone Encounter (Signed)
Pharmacy Patient Advocate Encounter  Insurance verification completed.    The patient does not have any prescription coverage  The patient is currently admitted and ran test claims for the following: Farxiga, Jardiance, Entresto.  Copays and coinsurance results were relayed to Inpatient clinical team.

## 2021-10-10 NOTE — ED Notes (Signed)
Patient reports that he has not taken any of his home medications for 2 years. Patient denies blood in stool or any vomiting.

## 2021-10-10 NOTE — H&P (Signed)
Advanced Heart Failure Team History and Physical Note   PCP:  Patient, No Pcp Per  PCP-Cardiology: Bryan Lemma, MD     Reason for Admission: Acute on Chronic Systolic Heart Failure    HPI:    Reginald Fernandez is seen today for evaluation of acute on chronic systolic heart failure at the request of Dr. Wallace Cullens, Emergency Medicine.    41 y/o male w/ h/o premature CAD s/p multiple MIs, initial inferior STEMI in 2011, HTN,  HLD, tobacco use and poor compliance.    H/o inferior STEMI by ECG in 09/2009 with non-obstructive CAD by cath @ that time.    Admitted 11/2013 w/ CP and inferolateral STE c/w STEMI. Emergent cath showed severe RCA and OM1 disease, both treated w/ PCI + DES. Echo showed normal EF, 55-60%. Lost to f/u.    Admitted 07/2016 w/ ACS. Trop I peaked 0.70. Cath showed two vessel disease with patent stents in the RCA, and OM of the Lcx with normal LVEDP. Unable to identify the culprit for Troponin elevation. He was restarted on plavix post cath. Follow up echo showed drop in EF down to 35-40% with basal anterolateral hypokinesis, and mid to apical akinesis, and G2DD. GDMT adjusted but unfortunately lost to f/u again.    Readmitted 10/2018 for left sided chest pain. Admitted to poor med compliance. Ruled in for NSTEMI. High-sensitivity troponin peaked 9,501. Cath demonstrated the following   Multivessel CAD with mild luminal irregularity of the LAD and evidence for 60% narrowing in a very small first diagonal vessel.  The second diagonal vessel bifurcates in its mid segment and there appears to be total occlusion of the inferior limb after the bifurcation.  (This was not able to be depicted on the diagram); the stent in the circumflex vessel which extends into the OM-1 vessel was widely patent and there is ostial pinching of the AV groove circumflex with narrowing of 50 to 60%; the RCA has luminal irregularity with 30% proximal and proximal to mid stenoses.  The mid RCA stent and distal RCA  stent is widely patent.   He was treated medically. Echo showed further reduction in LV systolic function w/ EF 20-25%, GIIDD, RV mildly reduced.    Repeat Echo 10/21 showed slightly improved EF, 30-35%, RV normal.    Unfortunately, was lost to f/u again. Not seen by Cardiology since 2021. Has been off all prescription meds x 1 year.    Presented to ED overnight w/ complaints of SOB + intermittent epigastric abdominal pain. Symptoms started several weeks ago and progressively worsened. BNP 1,097. CXR w/ cardiomegaly and pulmonary vascular congestion. Hs Trop 20>>18. EKG NSR w/ BAE, QRS 112 ms. UDS + for THC. Negative for cocaine. BP elevated 150s/low 100s.    CMP notable for elevated TBili, at 2.9. LFTs normal. Lipase mildly elevated at 53. CT of  A/P demonstrated diffuse fatty infiltration of the liver + mild gallbladder wall edema, possibly related to liver disease. No gallstones. Pancrease unremarkable.    He was given IV Lasix in the ED. Good response w/ 4L on UOP. AHF team asked to evalute.    Feeling better today. Breathing improved. Still w/ mild volume overload on exam. Denies chest pain.    Long discussion w/ patient and family (mother and father) regarding his diagnosis and importance of strict med compliance and adherence to regular close cardiac f/u. He is willing to improve compliance.    Denies ETOH use. No longer uses tobacco. Father w/ premature  CAD, first MI at age 73. Paternal grandfather, maternal grandfather and uncle all w/ MIs in their early 30s. No known family history of CHF.      Last Echo  12/2019 1. Left ventricular ejection fraction, by estimation, is 30 to 35%. The left ventricle has moderately decreased function. The left ventricle demonstrates global hypokinesis. The left ventricular internal cavity size was mildly dilated. Left ventricular diastolic parameters are consistent with Grade I diastolic dysfunction (impaired relaxation). 2. Right ventricular  systolic function is normal. The right ventricular size is normal. Tricuspid regurgitation signal is inadequate for assessing PA pressure. 3. A small pericardial effusion is present. The pericardial effusion is circumferential. There is no evidence of cardiac tamponade. 4. The mitral valve is grossly normal. No evidence of mitral valve regurgitation. No evidence of mitral stenosis. 5. The aortic valve is tricuspid. Aortic valve regurgitation is not visualized. No aortic stenosis is present. 6. The inferior vena cava is normal in size with greater than 50% respiratory variability, suggesting right atrial pressure of 3 mmHg.   Last LHC 10/2018    Prox RCA-1 lesion is 30% stenosed. Prox RCA-2 lesion is 30% stenosed. Previously placed Prox RCA to Mid RCA stent (unknown type) is widely patent. Previously placed Dist RCA stent (unknown type) is widely patent. Previously placed 1st Mrg stent (unknown type) is widely patent. Previously placed Prox Cx stent (unknown type) is widely patent. Prox Cx to Mid Cx lesion is 55% stenosed. 1st Diag lesion is 60% stenosed.     Coronary Diagrams   Diagnostic Dominance: Right          Review of Systems: [y] = yes, [ ]  = no    General: Weight gain [ ] ; Weight loss [ ] ; Anorexia [ ] ; Fatigue [ ] ; Fever [ ] ; Chills [ ] ; Weakness [ ]   Cardiac: Chest pain/pressure [ ] ; Resting SOB [Y]; Exertional SOB [Y ]; Orthopnea [ ] ; Pedal Edema [ ] ; Palpitations [ ] ; Syncope [ ] ; Presyncope [ ] ; Paroxysmal nocturnal dyspnea[ ]   Pulmonary: Cough [ ] ; Wheezing[ ] ; Hemoptysis[ ] ; Sputum [ ] ; Snoring [ ]   GI: Vomiting[ ] ; Dysphagia[ ] ; Melena[ ] ; Hematochezia [ ] ; Heartburn[ ] ; Abdominal pain [ Y]; Constipation [ ] ; Diarrhea [ ] ; BRBPR [ ]   GU: Hematuria[ ] ; Dysuria [ ] ; Nocturia[ ]   Vascular: Pain in legs with walking [ ] ; Pain in feet with lying flat [ ] ; Non-healing sores [ ] ; Stroke [ ] ; TIA [ ] ; Slurred speech [ ] ;  Neuro: Headaches[ ] ; Vertigo[ ] ; Seizures[ ] ;  Paresthesias[ ] ;Blurred vision [ ] ; Diplopia [ ] ; Vision changes [ ]   Ortho/Skin: Arthritis [ ] ; Joint pain [ ] ; Muscle pain [ ] ; Joint swelling [ ] ; Back Pain [ ] ; Rash [ ]   Psych: Depression[ ] ; Anxiety[ ]   Heme: Bleeding problems [ ] ; Clotting disorders [ ] ; Anemia [ ]   Endocrine: Diabetes [ ] ; Thyroid dysfunction[ ]    Home Medications        Prior to Admission medications   Medication Sig Start Date End Date Taking? Authorizing Provider  acetaminophen (TYLENOL) 500 MG tablet Take 500-1,000 mg by mouth every 6 (six) hours as needed for moderate pain or headache.       [provider]  aspirin 81 MG tablet Take 81 mg by mouth daily.        [provider]  atorvastatin (LIPITOR) 80 MG tablet Take 1 tablet (80 mg total) by mouth daily at 6 PM. NEED OV. 12/04/19  Marykay Lex, MD  carvedilol (COREG) 6.25 MG tablet TAKE 1 TABLET BY MOUTH TWO TIMES DAILY WITH A MEAL 12/04/19     Marykay Lex, MD  clopidogrel (PLAVIX) 75 MG tablet Take 1 tablet (75 mg total) by mouth daily. 12/04/19     Marykay Lex, MD  ezetimibe (ZETIA) 10 MG tablet Take 1 tablet (10 mg total) by mouth daily. 12/04/19     Marykay Lex, MD  lidocaine (XYLOCAINE) 2 % solution Use as directed 15 mLs in the mouth or throat as needed for mouth pain. 10/05/20     Petrucelli, Samantha R, PA-C  nitroGLYCERIN (NITROSTAT) 0.4 MG SL tablet Place 1 tablet (0.4 mg total) under the tongue every 5 (five) minutes as needed for chest pain. 12/04/19     Marykay Lex, MD  sacubitril-valsartan (ENTRESTO) 24-26 MG Take 1 tablet by mouth 2 (two) times daily. 12/04/19     Marykay Lex, MD  spironolactone (ALDACTONE) 25 MG tablet TAKE 1 TABLET BY MOUTH DAILY 12/04/19 12/03/20   Marykay Lex, MD      Past Medical History:     Past Medical History:  Diagnosis Date   CAD S/P percutaneous coronary angioplasty 11/20/2013    a. 09/2009: EKG with Inf STEMI - no obstructive CAD;  b. 11/2013 Inflat STEMI/PCI: LM nl,  LAD 20p, D1 sm - diff dzs, D2 large - nl, LCX 95-99, OM1 100 (3.5x38 Promus Premier DES), RCA 95-34m, 80d (3.0x20 and 3.0x16 Promus Premier DES') - normal EF;  c. NSTEMI 5/18 - patent stents, otw minimal CAD.- EF by Echo 35-40%; d. NSTEMI 10/2018 - ? culprit - Small branch of  D2,Med Rx,.  EF by Echo ~25%   Cardiomyopathy, ischemic 07/2016    h/o Inf STEMI 11/2013 - EF was "Normal"; b) NSTEMI 07/2016 (NO Cultprit on Cath) - Echo EF 35-40% (Basal Ant-Lat HK, basal-mid Inferolateral & apical Akinesis); c) NSTEMI 10/2018 -felt to be occluded small branch of D2, Echo EF further decreased to 20-25%.    Essential hypertension     Hyperlipidemia with target LDL less than 100     Marijuana abuse     Migraine     Morbid obesity (HCC)     ST-segment elevation myocardial infarction (STEMI) of inferior wall (HCC) 11/20/2013      Past Surgical History:      Past Surgical History:  Procedure Laterality Date   LEFT HEART CATH AND CORONARY ANGIOGRAPHY N/A 08/01/2016    Procedure: Left Heart Cath and Coronary Angiography;  Surgeon: Tonny Bollman, MD;  Location: [facility] INVASIVE CV LAB;;  Stable 2 V CAD - patent RCA & OM stents. Diffuse, non-obstructive CAD elsewhere. Moderately elevated LVEDP --> No culprit lesion  => restarted Plavix & titrate Med Rx.   LEFT HEART CATH AND CORONARY ANGIOGRAPHY N/A 10/24/2018    Procedure: LEFT HEART CATH AND CORONARY ANGIOGRAPHY;  Surgeon: Lennette Bihari, MD;  Location: [facility] INVASIVE CV LAB;  60% small D1, D2 has 3 branches 1 of which is small and appears to be 100%, LCx-OM widely patent with mild 55% stenosis in the jailed AV groove LCx. RCA has mild luminal irregularities 30% proximal with widely patent stents in the mid and distal RCA. (Culprit 100% small branch of D2)   LEFT HEART CATH AND CORONARY ANGIOGRAPHY   09/2009    (Presumably in the setting of STEMI) mild RCA luminal Irregularities - MIld INf HK -- Med Rx; b)   LEFT HEART CATHETERIZATION WITH  CORONARY ANGIOGRAM N/A 11/20/2013     Procedure: LEFT HEART CATHETERIZATION WITH CORONARY ANGIOGRAM;  Surgeon: Marykay Lex, MD;  Location: [facility] CATH LAB;  Service: Cardiovascular;   Inf STEMI: OM1 100%, m-dRCA 95-99% thrombotic mRCA with tandem 80% -- PCI, LAD & Cx relatively normal.   PERCUTANEOUS CORONARY STENT INTERVENTION (PCI-S)   11/20/2013    a) PCI - OM1 Promus Premier DES 3.5 mm x 38 mm, m-d RCA Promus Premier DES  3.0 mm x 20 mm & 3.0 mm x 16 mm   TRANSTHORACIC ECHOCARDIOGRAM   11/20/2013    LV EF 55-60%. No regional wall motion abnormality.  Normal valves    TRANSTHORACIC ECHOCARDIOGRAM   08/11/2016    (Non-STEMI-no culprit lesion): Moderately dilated LV.  Basal anterolateral HK with basal-mid inferolateral akinesis.  Mild-apical inferior akinesis.  EF 35 to 40%.  GRII DD.  Mild biatrial dilation.  Normal RV size and function. Mostly normal valves.   TRANSTHORACIC ECHOCARDIOGRAM   10/24/2018    (Non-STEMI-culprit lesion was 1 of 3 small 2 branches.)  EF 20-25%.  Mild mildly dilated LV.  GR 2 DD.  Mild biatrial dilation.  Normal PA pressures.      Family History:      Family History  Problem Relation Age of Onset   Hypertension Mother     Heart attack Father 70        2 MIs by age 23 (first at 45)-- CABG   Hypertension Father     Heart failure Father     Hyperlipidemia Father     Heart attack Maternal Grandfather          87's   Heart attack Paternal Grandfather          42's   Heart attack Other        Social History: Social History         Socioeconomic History   Marital status: Married      Spouse name: Not on file   Number of children: 2   Years of education: Not on file   Highest education level: Not on file  Occupational History      Employer: Psychologist, counselling      Comment: [employer]  Tobacco Use   Smoking status: Former      Types: Cigarettes      Quit date: 09/2016      Years since quitting: 5.0   Smokeless tobacco: Never  Vaping Use   Vaping Use: Every day   Substance and Sexual Activity   Alcohol use: No      Alcohol/week: 0.0 standard drinks of alcohol   Drug use: No   Sexual activity: Yes  Other Topics Concern   Not on file  Social History Narrative    Lives in [place] with wife.  Works with mentally challenged adults.    Social Determinants of Health    Financial Resource Strain: Not on file  Food Insecurity: Not on file  Transportation Needs: Not on file  Physical Activity: Not on file  Stress: Not on file  Social Connections: Not on file      Allergies:       Allergies  Allergen Reactions   Penicillins Other (See Comments)      Severe due to asthma Did it involve swelling of the face/tongue/throat, SOB, or low BP? No Did it involve sudden or severe rash/hives, skin peeling, or any reaction on the inside of your mouth or nose? No Did you need  to seek medical attention at a hospital or doctor's office? No When did it last happen?      child If all above answers are "NO", may proceed with cephalosporin use.      Objective:     Vital Signs:                 Temp:  [98 F (36.7 C)-99.1 F (37.3 C)] 98.3 F (36.8 C) (07/25 0939) Pulse Rate:  [60-116] 92 (07/25 0939) Resp:  [14-19] 18 (07/25 0939) BP: (105-154)/(71-119) 135/103 (07/25 0939) SpO2:  [94 %-100 %] 96 % (07/25 0939) Weight:  [116.8 kg-118.8 kg] 116.8 kg (07/25 0939) Last BM Date : 10/09/21   Weight change:     Filed Weights    10/09/21 1837 10/10/21 0939  Weight: 118.8 kg 116.8 kg      Intake/Output:              Intake/Output Summary (Last 24 hours) at 10/10/2021 1039 Last data filed at 10/10/2021 0531    Gross per 24 hour  Intake --  Output 4050 ml  Net -4050 ml                   Physical Exam    General:  Well appearing. No resp difficulty HEENT: normal Neck: supple. JVP 12 cm . Carotids 2+ bilat; no bruits. No lymphadenopathy or thyromegaly appreciated. Cor: PMI nondisplaced. Regular rate & rhythm. No rubs, gallops or murmurs. Lungs:  clear Abdomen: soft, nontender, nondistended. No hepatosplenomegaly. No bruits or masses. Good bowel sounds. Extremities: no cyanosis, clubbing, rash, edema Neuro: alert & orientedx3, cranial nerves grossly intact. moves all 4 extremities w/o difficulty. Affect pleasant     Telemetry    NSR - ST 90s-low 100s    EKG    NSR w/ BAE, QRS 112 ms   Labs    Basic Metabolic Panel: Last Labs        Recent Labs  Lab 10/09/21 1845 10/09/21 2038 10/09/21 2044  NA 137  --  136  K 3.6  --  4.2  CL 103  --  102  CO2 25  --  23  GLUCOSE 120*  --  144*  BUN 10  --  10  CREATININE 0.94  --  1.02  CALCIUM 8.7*  --  8.6*  MG  --  1.9  --         Liver Function Tests: Last Labs      Recent Labs  Lab 10/09/21 2044  AST 27  ALT 14  ALKPHOS 63  BILITOT 2.9*  PROT 7.3  ALBUMIN 4.1      Last Labs      Recent Labs  Lab 10/09/21 2044  LIPASE 53*      Last Labs   No results for input(s): "AMMONIA" in the last 168 hours.     CBC: Last Labs      Recent Labs  Lab 10/09/21 1845  WBC 10.4  HGB 14.8  HCT 43.7  MCV 88.8  PLT 245        Cardiac Enzymes: Last Labs   No results for input(s): "CKTOTAL", "CKMB", "CKMBINDEX", "TROPONINI" in the last 168 hours.     BNP: BNP (last 3 results) Recent Labs (within last 365 days)     Recent Labs    10/09/21 1845  BNP 1,097.4*        ProBNP (last 3 results) Recent Labs (within last 365 days)  No results for input(s): "PROBNP"  in the last 8760 hours.       CBG: Last Labs   No results for input(s): "GLUCAP" in the last 168 hours.     Coagulation Studies: Recent Labs (last 2 labs)   No results for input(s): "LABPROT", "INR" in the last 72 hours.       Imaging    CT ABDOMEN PELVIS W CONTRAST   Result Date: 10/09/2021 CLINICAL DATA:  Epigastric discomfort.  History of marijuana use. EXAM: CT ABDOMEN AND PELVIS WITH CONTRAST TECHNIQUE: Multidetector CT imaging of the abdomen and pelvis was performed using the  standard protocol following bolus administration of intravenous contrast. RADIATION DOSE REDUCTION: This exam was performed according to the departmental dose-optimization program which includes automated exposure control, adjustment of the mA and/or kV according to patient size and/or use of iterative reconstruction technique. CONTRAST:  OMNIPAQUE IOHEXOL 300 MG/ML  SOLN COMPARISON:  None Available. FINDINGS: Lower chest: Lung bases are clear.  Small esophageal hiatal hernia. Hepatobiliary: Diffuse fatty infiltration suggested in the liver. Mild gallbladder wall edema, possibly related to liver disease. No gallstones. No bile duct dilatation. Pancreas: Unremarkable. No pancreatic ductal dilatation or surrounding inflammatory changes. Spleen: Normal in size without focal abnormality. Adrenals/Urinary Tract: Adrenal glands are unremarkable. Kidneys are normal, without renal calculi, focal lesion, or hydronephrosis. Bladder is unremarkable. Stomach/Bowel: Stomach is within normal limits. Appendix appears normal. No evidence of bowel wall thickening, distention, or inflammatory changes. Vascular/Lymphatic: Aortic atherosclerosis. No enlarged abdominal or pelvic lymph nodes. Reproductive: Prostate gland is enlarged. Other: No abdominal wall hernia or abnormality. No abdominopelvic ascites. Musculoskeletal: No acute or significant osseous findings. IMPRESSION: 1. Diffuse fatty infiltration of the liver. 2. Mild gallbladder wall edema possibly related to liver disease. No gallstones. 3. Aortic atherosclerosis. Electronically Signed   By: Burman Nieves M.D.   On: 10/09/2021 22:22    DG Chest 2 View   Result Date: 10/09/2021 CLINICAL DATA:  Shortness of breath and chest tightness episode this past weekend, history asthma, coronary artery disease post MI and coronary stenting EXAM: CHEST - 2 VIEW COMPARISON:  10/23/2018 FINDINGS: Enlargement of cardiac silhouette and pulmonary vascular congestion new since  previous study. Mediastinal contours normal. Lungs clear. No pulmonary infiltrate, pleural effusion, or pneumothorax. Osseous structures unremarkable. IMPRESSION: Enlargement of cardiac silhouette with pulmonary vascular congestion new since previous study. Electronically Signed   By: Ulyses Southward M.D.   On: 10/09/2021 19:02       Medications:       Current Medications:     Infusions:         Patient Profile    41 y/o male w/ h/o premature CAD s/p multiple MIs, initial inferior STEMI in 2011, HTN,  HLD, tobacco use and poor compliance. Presented to ED overnight w/ complaints of SOB + intermittent epigastric abdominal pain due to acute systolic heart failure.    Assessment/Plan    Acute on Chronic Systolic Heart Failure - Mixed ischemic and nonischemic component (? HTN as contributor)   - STEMI 2015 w/ PCI to RCA and OM1>>EF  55-60% - NSTEMI 2018 patent stents>>EF 35-40% - NSTEMI 2020 patent stents>>EF 20-25%, RV mildly reduced - Echo 2021 EF 30-35% - Admitted w/ NYHA Class II-III symptoms + fluid overload - good initial response to lasix. C/w fluid overload. Continue IV Lasix 40 mg x 1 - repeat echo  - plan [facility] tomorrow  - restart GDMT  - add Entresto 24-26 mg bid - add spiro 12.5 mg daily  - anticipate SGLT2i  prior to d/c (Hgb A1c pending) - hold initiation of ? blocker given acute decompensation  - discussed importance of strict med compliance. If EF does not improve w/ improved med adherence, may need to consider future advanced therapies      2. Premature CAD  - + FH. Father w/ CAD and had MI at age 12  - Pt w/ CAD diagnosis in late 49s, s/p multiple MIs - Last intervention 2018>> PCI + DES to RCA and OM1 - Last cath 2020 w/ patent stents, nonosb dz - Denies ischemic like CP. HS trop 20>>18 - Plan LHC tomorrow to check for disease progression  - Medical therapy. Restart ASA 81 + statin - Hold initiation of ? blocker given acute decompensated HF   3. Hypertension   - uncontrolled, in setting of med noncompliance  - GDMT initiation per above, start Entresto and spiro    4. HLD - LDL goal < 70 mg/dL - Check Lipid panel  - not on statin PTA - restart statin therapy. Discussed importance of med compliance    5. Fatty Liver Disease  - elevated TBili, at 2.9. LFTs normal. Lipase mildly elevated at 53. CT of  A/P demonstrated diffuse fatty infiltration of the liver + mild gallbladder wall edema, possibly related to liver disease. No gallstones. Pancrease unremarkable. - denies ETOH  - needs outpatient GI consultation    6. H/o Snoring  - had sleep study in 2016>>Increased upper airway resistance syndrome (UARS) without definitive sleep apnea.   Length of Stay: 0   Robbie Lis, PA-C  10/10/2021, 10:39 AM   Advanced Heart Failure Team Pager [phone] (M-F; 7a - 5p)  Please contact [facility] for night-coverage after hours (4p -7a ) and weekends on [email or web address]    Patient seen with PA, agree with the above note.  Past history as noted above.    Patient admitted with exertional dyspnea and chest tightness, as noted above.  Has had some symptoms for a couple of months, but particularly bad this past weekend when he was at the beach.  He has not taken any cardiac meds or seen a cardiologist in about 2 years.  He had been doing fine up to the last couple of months.     BNP was elevated at 1097, vascular congestion on CXR, HS-TnI 20 => 18.    General: NAD Neck: JVP 12-13, no thyromegaly or thyroid nodule.  Lungs: Clear to auscultation bilaterally with normal respiratory effort. CV: Nondisplaced PMI.  Heart regular S1/S2, no S3/S4, no murmur.  Trace ankle edema.  No carotid bruit.  Normal pedal pulses.  Abdomen: Soft, nontender, no hepatosplenomegaly, no distention.  Skin: Intact without lesions or rashes.  Neurologic: Alert and oriented x 3.  Psych: Normal affect. Extremities: No clubbing or cyanosis.  HEENT: Normal.    1. Acute on chronic  systolic CHF: Ischemic cardiomyopathy.  Last echo in 10/21, EF 30-35%.  EF as low as 20-25% in 8/20.  He has diuresed well with initial Lasix 40 mg IV x 1 (4 L UOP).  He is breathing better though still at least mildly volume overloaded on exam. Has had no cardiac meds x 2 years.  - I will give him another dose of Lasix 40 mg IV x 1 now.  - Start Entresto 24/26 bid.  - Start spironolactone 12.5 mg daily.  - Needs echocardiogram.  - If EF remains < 35% (as I expect), will need to consider ICD placement.  - RHC tomorrow morning.  Discussed risks/benefits with patient and he agrees to procedure.  - Stressed importance of maintaining compliance with his HF meds.  2. CAD: Strong family history of premature CAD.  H/o inferolateral STEMI in 9/15 with DES to LCx into OM1 and DES to RCA.  He has had chest tightness along with dyspnea with exertion.  HS-TnI was only minimally elevated with no trend suggesting demand ischemia from volume overload.  However, with worsening HF symptoms over the last month, I worry that he has developed progressive coronary disease.   - Restart statin.  - Restart ASA - Coronary angiography tomorrow morning with RHC.  Discussed risks/benefits, he agrees to procedure.  3. HTN: Will control with Entresto, spironolactone to begin with.  4. Hyperlipidemia: Goal LDL < 55, has been off statin.  - Check lipid panel.  - Restart statin.  5. Fatty liver disease: Noted on CT abdomen.    Reginald Fernandez 10/10/2021 1:56 PM

## 2021-10-10 NOTE — TOC Initial Note (Signed)
Transition of Care [facility]) - Initial/Assessment Note    Patient Details  Name: Reginald Fernandez MRN: [id number] Date of Birth: October 16, 1980  Transition [facility] CM/SW Contact:    Elliot Cousin, RN Phone Number: [phone] 10/10/2021, 5:13 PM  Clinical Narrative:                 HF TOC CM spoke to pt at bedside. States he works full-time, but does not have insurance. He did not sign up for his health insurance but plans to enroll. Pt will need PCP follow up appt and assistance with medications at dc. Will utilize HF fund/MATCH for medications. Request medications come up from [facility] pharmacy at dc.   Expected Discharge Plan: Home/Self Care Barriers to Discharge: Continued Medical Work up   Patient Goals and CMS Choice        Expected Discharge Plan and Services Expected Discharge Plan: Home/Self Care   Discharge Planning Services: CM Consult   Living arrangements for the past 2 months: Single Family Home                                      Prior Living Arrangements/Services Living arrangements for the past 2 months: Single Family Home Lives with:: Spouse Patient language and need for interpreter reviewed:: Yes Do you feel safe going back to the place where you live?: Yes      Need for Family Participation in Patient Care: No (Comment) Care giver support system in place?: No (comment)   Criminal Activity/Legal Involvement Pertinent to Current Situation/Hospitalization: No - Comment as needed  Activities of Daily Living Home Assistive Devices/Equipment: None ADL Screening (condition at time of admission) Patient's cognitive ability adequate to safely complete daily activities?: Yes Is the patient deaf or have difficulty hearing?: No Does the patient have difficulty seeing, even when wearing glasses/contacts?: No Does the patient have difficulty concentrating, remembering, or making decisions?: No Patient able to express need for assistance with ADLs?:  Yes Does the patient have difficulty dressing or bathing?: No Independently performs ADLs?: Yes (appropriate for developmental age) Does the patient have difficulty walking or climbing stairs?: No Weakness of Legs: None Weakness of Arms/Hands: None  Permission Sought/Granted Permission sought to share information with : Case Manager, Family Supports, PCP Permission granted to share information with : Yes, Verbal Permission Granted  Share Information with NAME: Haru Shaff     Permission granted to share info w Relationship: mother  Permission granted to share info w Contact Information: [phone]  Emotional Assessment Appearance:: Appears stated age Attitude/Demeanor/Rapport: Engaged Affect (typically observed): Accepting Orientation: : Oriented to Self, Oriented to Place, Oriented to  Time, Oriented to Situation   Psych Involvement: No (comment)  Admission diagnosis:  CHF (congestive heart failure) (HCC) [I50.9] Leg edema [R60.0] Elevated troponin [R77.8] Pulmonary vascular congestion [R09.89] Acute on chronic congestive heart failure, unspecified heart failure type [facility]) [I50.9] Patient Active Problem List   Diagnosis Date Noted   CHF (congestive heart failure) (HCC) 10/09/2021   Educated about COVID-19 virus infection 12/06/2019   Prediabetes 10/25/2018   Dilated cardiomyopathy (HCC) 08/01/2016   Chronic combined systolic and diastolic heart failure (HCC) 08/01/2016   NSTEMI (non-ST elevated myocardial infarction) (HCC) 07/31/2016   Snoring 02/22/2014   Daytime somnolence 02/22/2014   Snores 11/23/2013   Marijuana abuse    Morbid obesity (HCC)    History of acute inferior wall  MI 11/20/2013   ST elevation myocardial infarction (STEMI) of true posterior wall, subsequent episode of care [facility]) 11/20/2013   CAD S/P percutaneous coronary angioplasty: DES PCI -Cx-OM1, RCA x 2 11/20/2013    Class: Status post   Essential hypertension    Hyperlipidemia LDL goal <70     HYPERGLYCEMIA 10/25/2009   ASTHMA 10/24/2009   PCP:  Patient, No Pcp Per Pharmacy:   [facility] [address] [place] [address] Phone: [phone] Fax: [phone]  [facility] [address] [address] Phone: [phone] Fax: [phone]  [id number] [place], [place] - [address] AT [address] [place] [address] Phone: [phone] Fax: [phone]     Social Determinants of Health (SDOH) Interventions    Readmission Risk Interventions     No data to display

## 2021-10-10 NOTE — Progress Notes (Signed)
Heart Failure Navigator Progress Note  Assessed for Heart & Vascular TOC clinic readiness.  Patient does not meet criteria due to consulted with the Advanced Heart Failure Team.    Rhae Hammock, BSN, RN Heart Failure Nurse Navigator Secure Chat Only

## 2021-10-10 NOTE — Consult Note (Addendum)
Advanced Heart Failure Team Consult Note   Primary Physician: Patient, No Pcp Per PCP-Cardiologist:  Bryan Lemma, MD  Reason for Consultation: Acute on Chronic Systolic Heart Failure  HPI:    Reginald Fernandez is seen today for evaluation of acute on chronic systolic heart failure at the request of Dr. Wallace Cullens, Emergency Medicine.   41 y/o male w/ h/o premature CAD s/p multiple MIs, initial inferior STEMI in 2011, HTN,  HLD, tobacco use and poor compliance.   H/o inferior STEMI by ECG in 09/2009 with non-obstructive CAD by cath @ that time.   Admitted 11/2013 w/ CP and inferolateral STE c/w STEMI. Emergent cath showed severe RCA and OM1 disease, both treated w/ PCI + DES. Echo showed normal EF, 55-60%. Lost to f/u.   Admitted 07/2016 w/ ACS. Trop I peaked 0.70. Cath showed two vessel disease with patent stents in the RCA, and OM of the Lcx with normal LVEDP. Unable to identify the culprit for Troponin elevation. He was restarted on plavix post cath. Follow up echo showed drop in EF down to 35-40% with basal anterolateral hypokinesis, and mid to apical akinesis, and G2DD. GDMT adjusted but unfortunately lost to f/u again.   Readmitted 10/2018 for left sided chest pain. Admitted to poor med compliance. Ruled in for NSTEMI. High-sensitivity troponin peaked 9,501. Cath demonstrated the following  Multivessel CAD with mild luminal irregularity of the LAD and evidence for 60% narrowing in a very small first diagonal vessel.  The second diagonal vessel bifurcates in its mid segment and there appears to be total occlusion of the inferior limb after the bifurcation.  (This was not able to be depicted on the diagram); the stent in the circumflex vessel which extends into the OM-1 vessel was widely patent and there is ostial pinching of the AV groove circumflex with narrowing of 50 to 60%; the RCA has luminal irregularity with 30% proximal and proximal to mid stenoses.  The mid RCA stent and distal RCA stent  is widely patent.  He was treated medically. Echo showed further reduction in LV systolic function w/ EF 20-25%, GIIDD, RV mildly reduced.   Repeat Echo 10/21 showed slightly improved EF, 30-35%, RV normal.   Unfortunately, was lost to f/u again. Not seen by Cardiology since 2021. Has been off all prescription meds x 1 year.   Presented to ED overnight w/ complaints of SOB + intermittent epigastric abdominal pain. Symptoms started several weeks ago and progressively worsened. BNP 1,097. CXR w/ cardiomegaly and pulmonary vascular congestion. Hs Trop 20>>18. EKG NSR w/ BAE, QRS 112 ms. UDS + for THC. Negative for cocaine. BP elevated 150s/low 100s.   CMP notable for elevated TBili, at 2.9. LFTs normal. Lipase mildly elevated at 53. CT of  A/P demonstrated diffuse fatty infiltration of the liver + mild gallbladder wall edema, possibly related to liver disease. No gallstones. Pancrease unremarkable.   He was given IV Lasix in the ED. Good response w/ 4L on UOP. AHF team asked to evalute.   Feeling better today. Breathing improved. Still w/ mild volume overload on exam. Denies chest pain.   Long discussion w/ patient and family (mother and father) regarding his diagnosis and importance of strict med compliance and adherence to regular close cardiac f/u. He is willing to improve compliance.   Denies ETOH use. No longer uses tobacco. Father w/ premature CAD, first MI at age 15. Paternal grandfather, maternal grandfather and uncle all w/ MIs in their early 60s. No known  family history of CHF.    Last Echo  12/2019 1. Left ventricular ejection fraction, by estimation, is 30 to 35%. The left ventricle has moderately decreased function. The left ventricle demonstrates global hypokinesis. The left ventricular internal cavity size was mildly dilated. Left ventricular diastolic parameters are consistent with Grade I diastolic dysfunction (impaired relaxation). 2. Right ventricular systolic function is  normal. The right ventricular size is normal. Tricuspid regurgitation signal is inadequate for assessing PA pressure. 3. A small pericardial effusion is present. The pericardial effusion is circumferential. There is no evidence of cardiac tamponade. 4. The mitral valve is grossly normal. No evidence of mitral valve regurgitation. No evidence of mitral stenosis. 5. The aortic valve is tricuspid. Aortic valve regurgitation is not visualized. No aortic stenosis is present. 6. The inferior vena cava is normal in size with greater than 50% respiratory variability, suggesting right atrial pressure of 3 mmHg.  Last LHC 10/2018   Prox RCA-1 lesion is 30% stenosed. Prox RCA-2 lesion is 30% stenosed. Previously placed Prox RCA to Mid RCA stent (unknown type) is widely patent. Previously placed Dist RCA stent (unknown type) is widely patent. Previously placed 1st Mrg stent (unknown type) is widely patent. Previously placed Prox Cx stent (unknown type) is widely patent. Prox Cx to Mid Cx lesion is 55% stenosed. 1st Diag lesion is 60% stenosed.   Coronary Diagrams  Diagnostic Dominance: Right      Review of Systems: [y] = yes, [ ]  = no   General: Weight gain [ ] ; Weight loss [ ] ; Anorexia [ ] ; Fatigue [ ] ; Fever [ ] ; Chills [ ] ; Weakness [ ]   Cardiac: Chest pain/pressure [ ] ; Resting SOB [Y]; Exertional SOB [Y ]; Orthopnea [ ] ; Pedal Edema [ ] ; Palpitations [ ] ; Syncope [ ] ; Presyncope [ ] ; Paroxysmal nocturnal dyspnea[ ]   Pulmonary: Cough [ ] ; Wheezing[ ] ; Hemoptysis[ ] ; Sputum [ ] ; Snoring [ ]   GI: Vomiting[ ] ; Dysphagia[ ] ; Melena[ ] ; Hematochezia [ ] ; Heartburn[ ] ; Abdominal pain [ Y]; Constipation [ ] ; Diarrhea [ ] ; BRBPR [ ]   GU: Hematuria[ ] ; Dysuria [ ] ; Nocturia[ ]   Vascular: Pain in legs with walking [ ] ; Pain in feet with lying flat [ ] ; Non-healing sores [ ] ; Stroke [ ] ; TIA [ ] ; Slurred speech [ ] ;  Neuro: Headaches[ ] ; Vertigo[ ] ; Seizures[ ] ; Paresthesias[ ] ;Blurred vision [  ]; Diplopia [ ] ; Vision changes [ ]   Ortho/Skin: Arthritis [ ] ; Joint pain [ ] ; Muscle pain [ ] ; Joint swelling [ ] ; Back Pain [ ] ; Rash [ ]   Psych: Depression[ ] ; Anxiety[ ]   Heme: Bleeding problems [ ] ; Clotting disorders [ ] ; Anemia [ ]   Endocrine: Diabetes [ ] ; Thyroid dysfunction[ ]   Home Medications Prior to Admission medications   Medication Sig Start Date End Date Taking? Authorizing Provider  acetaminophen (TYLENOL) 500 MG tablet Take 500-1,000 mg by mouth every 6 (six) hours as needed for moderate pain or headache.    [provider]  aspirin 81 MG tablet Take 81 mg by mouth daily.     [provider]  atorvastatin (LIPITOR) 80 MG tablet Take 1 tablet (80 mg total) by mouth daily at 6 PM. NEED OV. 12/04/19   , MD  carvedilol (COREG) 6.25 MG tablet TAKE 1 TABLET BY MOUTH TWO TIMES DAILY WITH A MEAL 12/04/19   , MD  clopidogrel (PLAVIX) 75 MG tablet Take 1 tablet (75 mg total) by mouth daily. 12/04/19  Marykay Lex, MD  ezetimibe (ZETIA) 10 MG tablet Take 1 tablet (10 mg total) by mouth daily. 12/04/19   Marykay Lex, MD  lidocaine (XYLOCAINE) 2 % solution Use as directed 15 mLs in the mouth or throat as needed for mouth pain. 10/05/20   Petrucelli, Samantha R, PA-C  nitroGLYCERIN (NITROSTAT) 0.4 MG SL tablet Place 1 tablet (0.4 mg total) under the tongue every 5 (five) minutes as needed for chest pain. 12/04/19   Marykay Lex, MD  sacubitril-valsartan (ENTRESTO) 24-26 MG Take 1 tablet by mouth 2 (two) times daily. 12/04/19   Marykay Lex, MD  spironolactone (ALDACTONE) 25 MG tablet TAKE 1 TABLET BY MOUTH DAILY 12/04/19 12/03/20  Marykay Lex, MD    Past Medical History: Past Medical History:  Diagnosis Date   CAD S/P percutaneous coronary angioplasty 11/20/2013   a. 09/2009: EKG with Inf STEMI - no obstructive CAD;  b. 11/2013 Inflat STEMI/PCI: LM nl, LAD 20p, D1 sm - diff dzs, D2 large - nl, LCX 95-99, OM1 100 (3.5x38  Promus Premier DES), RCA 95-81m, 80d (3.0x20 and 3.0x16 Promus Premier DES') - normal EF;  c. NSTEMI 5/18 - patent stents, otw minimal CAD.- EF by Echo 35-40%; d. NSTEMI 10/2018 - ? culprit - Small branch of  D2,Med Rx,.  EF by Echo ~25%   Cardiomyopathy, ischemic 07/2016   h/o Inf STEMI 11/2013 - EF was "Normal"; b) NSTEMI 07/2016 (NO Cultprit on Cath) - Echo EF 35-40% (Basal Ant-Lat HK, basal-mid Inferolateral & apical Akinesis); c) NSTEMI 10/2018 -felt to be occluded small branch of D2, Echo EF further decreased to 20-25%.    Essential hypertension    Hyperlipidemia with target LDL less than 100    Marijuana abuse    Migraine    Morbid obesity (HCC)    ST-segment elevation myocardial infarction (STEMI) of inferior wall (HCC) 11/20/2013    Past Surgical History: Past Surgical History:  Procedure Laterality Date   LEFT HEART CATH AND CORONARY ANGIOGRAPHY N/A 08/01/2016   Procedure: Left Heart Cath and Coronary Angiography;  Surgeon: Tonny Bollman, MD;  Location: [facility] INVASIVE CV LAB;;  Stable 2 V CAD - patent RCA & OM stents. Diffuse, non-obstructive CAD elsewhere. Moderately elevated LVEDP --> No culprit lesion  => restarted Plavix & titrate Med Rx.   LEFT HEART CATH AND CORONARY ANGIOGRAPHY N/A 10/24/2018   Procedure: LEFT HEART CATH AND CORONARY ANGIOGRAPHY;  Surgeon: Lennette Bihari, MD;  Location: [facility] INVASIVE CV LAB;  60% small D1, D2 has 3 branches 1 of which is small and appears to be 100%, LCx-OM widely patent with mild 55% stenosis in the jailed AV groove LCx. RCA has mild luminal irregularities 30% proximal with widely patent stents in the mid and distal RCA. (Culprit 100% small branch of D2)   LEFT HEART CATH AND CORONARY ANGIOGRAPHY  09/2009   (Presumably in the setting of STEMI) mild RCA luminal Irregularities - MIld INf HK -- Med Rx; b)   LEFT HEART CATHETERIZATION WITH CORONARY ANGIOGRAM N/A 11/20/2013   Procedure: LEFT HEART CATHETERIZATION WITH CORONARY ANGIOGRAM;  Surgeon: Marykay Lex, MD;  Location: [facility] CATH LAB;  Service: Cardiovascular;   Inf STEMI: OM1 100%, m-dRCA 95-99% thrombotic mRCA with tandem 80% -- PCI, LAD & Cx relatively normal.   PERCUTANEOUS CORONARY STENT INTERVENTION (PCI-S)  11/20/2013   a) PCI - OM1 Promus Premier DES 3.5 mm x 38 mm, m-d RCA Promus Premier DES  3.0 mm x 20 mm &  3.0 mm x 16 mm   TRANSTHORACIC ECHOCARDIOGRAM  11/20/2013   LV EF 55-60%. No regional wall motion abnormality.  Normal valves    TRANSTHORACIC ECHOCARDIOGRAM  08/11/2016   (Non-STEMI-no culprit lesion): Moderately dilated LV.  Basal anterolateral HK with basal-mid inferolateral akinesis.  Mild-apical inferior akinesis.  EF 35 to 40%.  GRII DD.  Mild biatrial dilation.  Normal RV size and function. Mostly normal valves.   TRANSTHORACIC ECHOCARDIOGRAM  10/24/2018   (Non-STEMI-culprit lesion was 1 of 3 small 2 branches.)  EF 20-25%.  Mild mildly dilated LV.  GR 2 DD.  Mild biatrial dilation.  Normal PA pressures.    Family History: Family History  Problem Relation Age of Onset   Hypertension Mother    Heart attack Father 73       2 MIs by age 44 (first at 16)-- CABG   Hypertension Father    Heart failure Father    Hyperlipidemia Father    Heart attack Maternal Grandfather        64's   Heart attack Paternal Grandfather        68's   Heart attack Other     Social History: Social History   Socioeconomic History   Marital status: Married    Spouse name: Not on file   Number of children: 2   Years of education: Not on file   Highest education level: Not on file  Occupational History    Employer: Psychologist, counselling    Comment: [employer]  Tobacco Use   Smoking status: Former    Types: Cigarettes    Quit date: 09/2016    Years since quitting: 5.0   Smokeless tobacco: Never  Vaping Use   Vaping Use: Every day  Substance and Sexual Activity   Alcohol use: No    Alcohol/week: 0.0 standard drinks of alcohol   Drug use: No   Sexual activity:  Yes  Other Topics Concern   Not on file  Social History Narrative   Lives in [place] with wife.  Works with mentally challenged adults.   Social Determinants of Health   Financial Resource Strain: Not on file  Food Insecurity: Not on file  Transportation Needs: Not on file  Physical Activity: Not on file  Stress: Not on file  Social Connections: Not on file    Allergies:  Allergies  Allergen Reactions   Penicillins Other (See Comments)    Severe due to asthma Did it involve swelling of the face/tongue/throat, SOB, or low BP? No Did it involve sudden or severe rash/hives, skin peeling, or any reaction on the inside of your mouth or nose? No Did you need to seek medical attention at a hospital or doctor's office? No When did it last happen?      child If all above answers are "NO", may proceed with cephalosporin use.    Objective:    Vital Signs:   Temp:  [98 F (36.7 C)-99.1 F (37.3 C)] 98.3 F (36.8 C) (07/25 0939) Pulse Rate:  [60-116] 92 (07/25 0939) Resp:  [14-19] 18 (07/25 0939) BP: (105-154)/(71-119) 135/103 (07/25 0939) SpO2:  [94 %-100 %] 96 % (07/25 0939) Weight:  [116.8 kg-118.8 kg] 116.8 kg (07/25 0939) Last BM Date : 10/09/21  Weight change: Filed Weights   10/09/21 1837 10/10/21 0939  Weight: 118.8 kg 116.8 kg    Intake/Output:   Intake/Output Summary (Last 24 hours) at 10/10/2021 1039 Last data filed at 10/10/2021 0531 Gross per 24 hour  Intake --  Output 4050 ml  Net -4050 ml      Physical Exam    General:  Well appearing. No resp difficulty HEENT: normal Neck: supple. JVP 12 cm . Carotids 2+ bilat; no bruits. No lymphadenopathy or thyromegaly appreciated. Cor: PMI nondisplaced. Regular rate & rhythm. No rubs, gallops or murmurs. Lungs: clear Abdomen: soft, nontender, nondistended. No hepatosplenomegaly. No bruits or masses. Good bowel sounds. Extremities: no cyanosis, clubbing, rash, edema Neuro: alert & orientedx3, cranial nerves grossly  intact. moves all 4 extremities w/o difficulty. Affect pleasant   Telemetry   NSR - ST 90s-low 100s   EKG    NSR w/ BAE, QRS 112 ms  Labs   Basic Metabolic Panel: Recent Labs  Lab 10/09/21 1845 10/09/21 2038 10/09/21 2044  NA 137  --  136  K 3.6  --  4.2  CL 103  --  102  CO2 25  --  23  GLUCOSE 120*  --  144*  BUN 10  --  10  CREATININE 0.94  --  1.02  CALCIUM 8.7*  --  8.6*  MG  --  1.9  --     Liver Function Tests: Recent Labs  Lab 10/09/21 2044  AST 27  ALT 14  ALKPHOS 63  BILITOT 2.9*  PROT 7.3  ALBUMIN 4.1   Recent Labs  Lab 10/09/21 2044  LIPASE 53*   No results for input(s): "AMMONIA" in the last 168 hours.  CBC: Recent Labs  Lab 10/09/21 1845  WBC 10.4  HGB 14.8  HCT 43.7  MCV 88.8  PLT 245    Cardiac Enzymes: No results for input(s): "CKTOTAL", "CKMB", "CKMBINDEX", "TROPONINI" in the last 168 hours.  BNP: BNP (last 3 results) Recent Labs    10/09/21 1845  BNP 1,097.4*    ProBNP (last 3 results) No results for input(s): "PROBNP" in the last 8760 hours.   CBG: No results for input(s): "GLUCAP" in the last 168 hours.  Coagulation Studies: No results for input(s): "LABPROT", "INR" in the last 72 hours.   Imaging   CT ABDOMEN PELVIS W CONTRAST  Result Date: 10/09/2021 CLINICAL DATA:  Epigastric discomfort.  History of marijuana use. EXAM: CT ABDOMEN AND PELVIS WITH CONTRAST TECHNIQUE: Multidetector CT imaging of the abdomen and pelvis was performed using the standard protocol following bolus administration of intravenous contrast. RADIATION DOSE REDUCTION: This exam was performed according to the departmental dose-optimization program which includes automated exposure control, adjustment of the mA and/or kV according to patient size and/or use of iterative reconstruction technique. CONTRAST:  OMNIPAQUE IOHEXOL 300 MG/ML  SOLN COMPARISON:  None Available. FINDINGS: Lower chest: Lung bases are clear.  Small esophageal  hiatal hernia. Hepatobiliary: Diffuse fatty infiltration suggested in the liver. Mild gallbladder wall edema, possibly related to liver disease. No gallstones. No bile duct dilatation. Pancreas: Unremarkable. No pancreatic ductal dilatation or surrounding inflammatory changes. Spleen: Normal in size without focal abnormality. Adrenals/Urinary Tract: Adrenal glands are unremarkable. Kidneys are normal, without renal calculi, focal lesion, or hydronephrosis. Bladder is unremarkable. Stomach/Bowel: Stomach is within normal limits. Appendix appears normal. No evidence of bowel wall thickening, distention, or inflammatory changes. Vascular/Lymphatic: Aortic atherosclerosis. No enlarged abdominal or pelvic lymph nodes. Reproductive: Prostate gland is enlarged. Other: No abdominal wall hernia or abnormality. No abdominopelvic ascites. Musculoskeletal: No acute or significant osseous findings. IMPRESSION: 1. Diffuse fatty infiltration of the liver. 2. Mild gallbladder wall edema possibly related to liver disease. No gallstones. 3. Aortic atherosclerosis. Electronically Signed   By:  Burman Nieves M.D.   On: 10/09/2021 22:22   DG Chest 2 View  Result Date: 10/09/2021 CLINICAL DATA:  Shortness of breath and chest tightness episode this past weekend, history asthma, coronary artery disease post MI and coronary stenting EXAM: CHEST - 2 VIEW COMPARISON:  10/23/2018 FINDINGS: Enlargement of cardiac silhouette and pulmonary vascular congestion new since previous study. Mediastinal contours normal. Lungs clear. No pulmonary infiltrate, pleural effusion, or pneumothorax. Osseous structures unremarkable. IMPRESSION: Enlargement of cardiac silhouette with pulmonary vascular congestion new since previous study. Electronically Signed   By: Ulyses Southward M.D.   On: 10/09/2021 19:02     Medications:     Current Medications:   Infusions:     Patient Profile   41 y/o male w/ h/o premature CAD s/p multiple MIs, initial  inferior STEMI in 2011, HTN,  HLD, tobacco use and poor compliance. Presented to ED overnight w/ complaints of SOB + intermittent epigastric abdominal pain due to acute systolic heart failure.   Assessment/Plan   Acute on Chronic Systolic Heart Failure - Mixed ischemic and nonischemic component (? HTN as contributor)   - STEMI 2015 w/ PCI to RCA and OM1>>EF  55-60% - NSTEMI 2018 patent stents>>EF 35-40% - NSTEMI 2020 patent stents>>EF 20-25%, RV mildly reduced - Echo 2021 EF 30-35% - Admitted w/ NYHA Class II-III symptoms + fluid overload - good initial response to lasix. C/w fluid overload. Continue IV Lasix 40 mg x 1 - repeat echo  - plan R/LHC tomorrow  - restart GDMT  - add Entresto 24-26 mg bid - add spiro 12.5 mg daily  - anticipate SGLT2i prior to d/c (Hgb A1c pending) - hold initiation of ? blocker given acute decompensation  - discussed importance of strict med compliance. If EF does not improve w/ improved med adherence, may need to consider future advanced therapies    2. Premature CAD  - + FH. Father w/ CAD and had MI at age 73  - Pt w/ CAD diagnosis in late 77s, s/p multiple MIs - Last intervention 2018>> PCI + DES to RCA and OM1 - Last cath 2020 w/ patent stents, nonosb dz - Denies ischemic like CP. HS trop 20>>18 - Plan LHC tomorrow to check for disease progression  - Medical therapy. Restart ASA 81 + statin - Hold initiation of ? blocker given acute decompensated HF  3. Hypertension  - uncontrolled, in setting of med noncompliance  - GDMT initiation per above, start Entresto and spiro   4. HLD - LDL goal < 70 mg/dL - Check Lipid panel  - not on statin PTA - restart statin therapy. Discussed importance of med compliance   5. Fatty Liver Disease  - elevated TBili, at 2.9. LFTs normal. Lipase mildly elevated at 53. CT of  A/P demonstrated diffuse fatty infiltration of the liver + mild gallbladder wall edema, possibly related to liver disease. No gallstones.  Pancrease unremarkable. - denies ETOH  - needs outpatient GI consultation   6. H/o Snoring  - had sleep study in 2016>>Increased upper airway resistance syndrome (UARS) without definitive sleep apnea.  Length of Stay: 0  Robbie Lis, PA-C  10/10/2021, 10:39 AM  Advanced Heart Failure Team Pager [phone] (M-F; 7a - 5p)  Please contact [facility] for night-coverage after hours (4p -7a ) and weekends on [email or web address]   Patient seen with PA, agree with the above note.  Past history as noted above.   Patient admitted with exertional dyspnea and chest tightness, as noted above.  Has had some symptoms for a couple of months, but particularly bad this past weekend when he was at the beach.  He has not taken any cardiac meds or seen a cardiologist in about 2 years.  He had been doing fine up to the last couple of months.    BNP was elevated at 1097, vascular congestion on CXR, HS-TnI 20 => 18.   General: NAD Neck: JVP 12-13, no thyromegaly or thyroid nodule.  Lungs: Clear to auscultation bilaterally with normal respiratory effort. CV: Nondisplaced PMI.  Heart regular S1/S2, no S3/S4, no murmur.  Trace ankle edema.  No carotid bruit.  Normal pedal pulses.  Abdomen: Soft, nontender, no hepatosplenomegaly, no distention.  Skin: Intact without lesions or rashes.  Neurologic: Alert and oriented x 3.  Psych: Normal affect. Extremities: No clubbing or cyanosis.  HEENT: Normal.   1. Acute on chronic systolic CHF: Ischemic cardiomyopathy.  Last echo in 10/21, EF 30-35%.  EF as low as 20-25% in 8/20.  He has diuresed well with initial Lasix 40 mg IV x 1 (4 L UOP).  He is breathing better though still at least mildly volume overloaded on exam. Has had no cardiac meds x 2 years.  - I will give him another dose of Lasix 40 mg IV x 1 now.  - Start Entresto 24/26 bid.  - Start spironolactone 12.5 mg daily.  - Needs echocardiogram.  - If EF remains < 35% (as I expect), will need to consider ICD  placement.  - RHC tomorrow morning.  Discussed risks/benefits with patient and he agrees to procedure.  - Stressed importance of maintaining compliance with his HF meds.  2. CAD: Strong family history of premature CAD.  H/o inferolateral STEMI in 9/15 with DES to LCx into OM1 and DES to RCA.  He has had chest tightness along with dyspnea with exertion.  HS-TnI was only minimally elevated with no trend suggesting demand ischemia from volume overload.  However, with worsening HF symptoms over the last month, I worry that he has developed progressive coronary disease.   - Restart statin.  - Restart ASA - Coronary angiography tomorrow morning with RHC.  Discussed risks/benefits, he agrees to procedure.  3. HTN: Will control with Entresto, spironolactone to begin with.  4. Hyperlipidemia: Goal LDL < 55, has been off statin.  - Check lipid panel.  - Restart statin.  5. Fatty liver disease: Noted on CT abdomen.   Marca Ancona 10/10/2021 1:56 PM

## 2021-10-11 ENCOUNTER — Encounter (HOSPITAL_COMMUNITY)
Admission: EM | Disposition: A | Discharge status: Home / Self Care | Payer: Self-pay | Source: Home / Self Care | Attending: Cardiology

## 2021-10-11 ENCOUNTER — Telehealth (HOSPITAL_COMMUNITY): Payer: Self-pay | Admitting: Pharmacy Technician

## 2021-10-11 DIAGNOSIS — I2510 Atherosclerotic heart disease of native coronary artery without angina pectoris: Secondary | ICD-10-CM

## 2021-10-11 DIAGNOSIS — I493 Ventricular premature depolarization: Secondary | ICD-10-CM

## 2021-10-11 DIAGNOSIS — I5023 Acute on chronic systolic (congestive) heart failure: Secondary | ICD-10-CM

## 2021-10-11 HISTORY — PX: RIGHT/LEFT HEART CATH AND CORONARY ANGIOGRAPHY: CATH118266

## 2021-10-11 LAB — POCT I-STAT EG7
Acid-Base Excess: 3 mmol/L — ABNORMAL HIGH (ref 0.0–2.0)
Acid-Base Excess: 4 mmol/L — ABNORMAL HIGH (ref 0.0–2.0)
Bicarbonate: 28.5 mmol/L — ABNORMAL HIGH (ref 20.0–28.0)
Bicarbonate: 29.5 mmol/L — ABNORMAL HIGH (ref 20.0–28.0)
Calcium, Ion: 1.1 mmol/L — ABNORMAL LOW (ref 1.15–1.40)
Calcium, Ion: 1.11 mmol/L — ABNORMAL LOW (ref 1.15–1.40)
HCT: 49 % (ref 39.0–52.0)
HCT: 49 % (ref 39.0–52.0)
Hemoglobin: 16.7 g/dL (ref 13.0–17.0)
Hemoglobin: 16.7 g/dL (ref 13.0–17.0)
O2 Saturation: 70 %
O2 Saturation: 74 %
Potassium: 3.6 mmol/L (ref 3.5–5.1)
Potassium: 3.7 mmol/L (ref 3.5–5.1)
Sodium: 141 mmol/L (ref 135–145)
Sodium: 141 mmol/L (ref 135–145)
TCO2: 30 mmol/L (ref 22–32)
TCO2: 31 mmol/L (ref 22–32)
pCO2, Ven: 44.1 mmHg (ref 44–60)
pCO2, Ven: 44.9 mmHg (ref 44–60)
pH, Ven: 7.418 (ref 7.25–7.43)
pH, Ven: 7.426 (ref 7.25–7.43)
pO2, Ven: 36 mmHg (ref 32–45)
pO2, Ven: 39 mmHg (ref 32–45)

## 2021-10-11 LAB — CBC
HCT: 44.5 % (ref 39.0–52.0)
Hemoglobin: 14.9 g/dL (ref 13.0–17.0)
MCH: 29.7 pg (ref 26.0–34.0)
MCHC: 33.5 g/dL (ref 30.0–36.0)
MCV: 88.8 fL (ref 80.0–100.0)
Platelets: 221 10*3/uL (ref 150–400)
RBC: 5.01 MIL/uL (ref 4.22–5.81)
RDW: 13.6 % (ref 11.5–15.5)
WBC: 10.8 10*3/uL — ABNORMAL HIGH (ref 4.0–10.5)
nRBC: 0 % (ref 0.0–0.2)

## 2021-10-11 LAB — LIPID PANEL
Cholesterol: 175 mg/dL (ref 0–200)
HDL: 24 mg/dL — ABNORMAL LOW (ref >40)
LDL Cholesterol: 130 mg/dL — ABNORMAL HIGH (ref 0–99)
Total CHOL/HDL Ratio: 7.3 RATIO
Triglycerides: 106 mg/dL (ref <150)
VLDL: 21 mg/dL (ref 0–40)

## 2021-10-11 LAB — BASIC METABOLIC PANEL
Anion gap: 10 (ref 5–15)
BUN: 7 mg/dL (ref 6–20)
CO2: 28 mmol/L (ref 22–32)
Calcium: 8.5 mg/dL — ABNORMAL LOW (ref 8.9–10.3)
Chloride: 101 mmol/L (ref 98–111)
Creatinine, Ser: 0.93 mg/dL (ref 0.61–1.24)
GFR, Estimated: >60 mL/min (ref >60)
Glucose, Bld: 119 mg/dL — ABNORMAL HIGH (ref 70–99)
Potassium: 3.2 mmol/L — ABNORMAL LOW (ref 3.5–5.1)
Sodium: 139 mmol/L (ref 135–145)

## 2021-10-11 LAB — MAGNESIUM: Magnesium: 1.7 mg/dL (ref 1.7–2.4)

## 2021-10-11 SURGERY — RIGHT/LEFT HEART CATH AND CORONARY ANGIOGRAPHY
Anesthesia: LOCAL

## 2021-10-11 MED ORDER — MAGNESIUM SULFATE 2 GM/50ML IV SOLN
2.0000 g | Freq: Once | INTRAVENOUS | Status: AC
  Administered 2021-10-11: 2 g via INTRAVENOUS
  Filled 2021-10-11: qty 50

## 2021-10-11 MED ORDER — DAPAGLIFLOZIN PROPANEDIOL 10 MG PO TABS
10.0000 mg | ORAL_TABLET | Freq: Every day | ORAL | Status: DC
  Administered 2021-10-11 – 2021-10-12 (×2): 10 mg via ORAL
  Filled 2021-10-11 (×2): qty 1

## 2021-10-11 MED ORDER — IOHEXOL 350 MG/ML SOLN
INTRAVENOUS | Status: DC | PRN
  Administered 2021-10-11: 85 mL

## 2021-10-11 MED ORDER — VERAPAMIL HCL 2.5 MG/ML IV SOLN
INTRAVENOUS | Status: DC | PRN
  Administered 2021-10-11: 10 mL via INTRA_ARTERIAL

## 2021-10-11 MED ORDER — LIDOCAINE HCL (PF) 1 % IJ SOLN
INTRAMUSCULAR | Status: DC | PRN
  Administered 2021-10-11 (×2): 2 mL

## 2021-10-11 MED ORDER — CARVEDILOL 3.125 MG PO TABS
3.1250 mg | ORAL_TABLET | Freq: Two times a day (BID) | ORAL | Status: DC
  Administered 2021-10-11 – 2021-10-12 (×3): 3.125 mg via ORAL
  Filled 2021-10-11 (×3): qty 1

## 2021-10-11 MED ORDER — SPIRONOLACTONE 25 MG PO TABS
25.0000 mg | ORAL_TABLET | Freq: Every day | ORAL | Status: DC
  Administered 2021-10-12: 25 mg via ORAL
  Filled 2021-10-11: qty 1

## 2021-10-11 MED ORDER — HEPARIN SODIUM (PORCINE) 1000 UNIT/ML IJ SOLN
INTRAMUSCULAR | Status: AC
Start: 2021-10-11 — End: ?
  Filled 2021-10-11: qty 10

## 2021-10-11 MED ORDER — SPIRONOLACTONE 25 MG PO TABS: 25.0000 mg | ORAL_TABLET | Freq: Every day | ORAL | Status: DC

## 2021-10-11 MED ORDER — HEPARIN SODIUM (PORCINE) 1000 UNIT/ML IJ SOLN
INTRAMUSCULAR | Status: DC | PRN
  Administered 2021-10-11: 6000 [IU] via INTRAVENOUS

## 2021-10-11 MED ORDER — HEPARIN (PORCINE) IN NACL 1000-0.9 UT/500ML-% IV SOLN
INTRAVENOUS | Status: AC
  Filled 2021-10-11: qty 1000

## 2021-10-11 MED ORDER — ACETAMINOPHEN 325 MG PO TABS: 650.0000 mg | ORAL_TABLET | ORAL | Status: DC | PRN

## 2021-10-11 MED ORDER — ONDANSETRON HCL 4 MG/2ML IJ SOLN: 4.0000 mg | Freq: Four times a day (QID) | INTRAMUSCULAR | Status: DC | PRN

## 2021-10-11 MED ORDER — SODIUM CHLORIDE 0.9% FLUSH: 3.0000 mL | INTRAVENOUS | Status: DC | PRN

## 2021-10-11 MED ORDER — POTASSIUM CHLORIDE CRYS ER 20 MEQ PO TBCR
40.0000 meq | EXTENDED_RELEASE_TABLET | Freq: Once | ORAL | Status: AC
Start: 2021-10-11 — End: 2021-10-11
  Administered 2021-10-11: 40 meq via ORAL
  Filled 2021-10-11: qty 2

## 2021-10-11 MED ORDER — MIDAZOLAM HCL 2 MG/2ML IJ SOLN
INTRAMUSCULAR | Status: DC | PRN
  Administered 2021-10-11: 1 mg via INTRAVENOUS

## 2021-10-11 MED ORDER — SODIUM CHLORIDE 0.9 % IV SOLN: 250.0000 mL | INTRAVENOUS | Status: DC | PRN

## 2021-10-11 MED ORDER — HEPARIN (PORCINE) IN NACL 1000-0.9 UT/500ML-% IV SOLN
INTRAVENOUS | Status: DC | PRN
  Administered 2021-10-11 (×2): 500 mL

## 2021-10-11 MED ORDER — SODIUM CHLORIDE 0.9% FLUSH
3.0000 mL | Freq: Two times a day (BID) | INTRAVENOUS | Status: DC
Start: 2021-10-11 — End: 2021-10-12

## 2021-10-11 MED ORDER — LABETALOL HCL 5 MG/ML IV SOLN
10.0000 mg | INTRAVENOUS | Status: AC | PRN
Start: 2021-10-11 — End: 2021-10-11

## 2021-10-11 MED ORDER — FENTANYL CITRATE (PF) 100 MCG/2ML IJ SOLN
INTRAMUSCULAR | Status: DC | PRN
  Administered 2021-10-11: 25 ug via INTRAVENOUS

## 2021-10-11 MED ORDER — HYDRALAZINE HCL 20 MG/ML IJ SOLN
10.0000 mg | INTRAMUSCULAR | Status: AC | PRN
Start: 2021-10-11 — End: 2021-10-11

## 2021-10-11 MED ORDER — VERAPAMIL HCL 2.5 MG/ML IV SOLN
INTRAVENOUS | Status: AC
Start: 2021-10-11 — End: ?
  Filled 2021-10-11: qty 2

## 2021-10-11 MED ORDER — ENOXAPARIN SODIUM 40 MG/0.4ML IJ SOSY
40.0000 mg | PREFILLED_SYRINGE | INTRAMUSCULAR | Status: DC
  Administered 2021-10-12: 40 mg via SUBCUTANEOUS
  Filled 2021-10-11: qty 0.4

## 2021-10-11 MED ORDER — MIDAZOLAM HCL 2 MG/2ML IJ SOLN
INTRAMUSCULAR | Status: AC
  Filled 2021-10-11: qty 2

## 2021-10-11 MED ORDER — FENTANYL CITRATE (PF) 100 MCG/2ML IJ SOLN
INTRAMUSCULAR | Status: AC
  Filled 2021-10-11: qty 2

## 2021-10-11 MED ORDER — LIDOCAINE HCL (PF) 1 % IJ SOLN
INTRAMUSCULAR | Status: AC
  Filled 2021-10-11: qty 30

## 2021-10-11 SURGICAL SUPPLY — 13 items
BAND ZEPHYR COMPRESS 30 LONG (HEMOSTASIS) ×1 IMPLANT
CATH 5FR JL3.5 JR4 ANG PIG MP (CATHETERS) ×1 IMPLANT
CATH BALLN WEDGE 5F 110CM (CATHETERS) ×1 IMPLANT
CATH INFINITI 5FR JL4 (CATHETERS) ×1 IMPLANT
DEVICE RAD COMP TR BAND LRG (VASCULAR PRODUCTS) ×1 IMPLANT
GLIDESHEATH SLEND SS 6F .021 (SHEATH) ×1 IMPLANT
GUIDEWIRE .025 260CM (WIRE) ×1 IMPLANT
GUIDEWIRE INQWIRE 1.5J.035X260 (WIRE) IMPLANT
INQWIRE 1.5J .035X260CM (WIRE) ×2
KIT HEART LEFT (KITS) ×2 IMPLANT
PACK CARDIAC CATHETERIZATION (CUSTOM PROCEDURE TRAY) ×2 IMPLANT
SHEATH GLIDE SLENDER 4/5FR (SHEATH) ×1 IMPLANT
TRANSDUCER W/STOPCOCK (MISCELLANEOUS) ×2 IMPLANT

## 2021-10-11 NOTE — Telephone Encounter (Signed)
[name], applications for Farxiga 10 mg for this patient who is uninsured.      This encounter will be updated until final determination.   Reginald Fernandez, CPhT Pharmacy Patient Advocate Specialist [facility] Patient Advocate Team Direct Number: [phone]  Fax: [phone]

## 2021-10-11 NOTE — TOC Progression Note (Signed)
Transition of Care [facility]) - Progression Note    Patient Details  Name: Reginald Fernandez MRN: [id number] Date of Birth: 04/27/1980  [facility] CM/SW Contact  Leone Haven, RN Phone Number: 10/11/2021, 10:29 AM  Clinical Narrative:    From home, indep, still works, he states he does not have PCP or insurance,  NCM called [facility], they did not have an apt within 2 weeks so the administrative person will be calling patient back to schedule his apt with them to work him in.  He presents with CHF, for left and right hear cath today, on all po meds. EF < 20%. TOC following.    Expected Discharge Plan: Home/Self Care Barriers to Discharge: Continued Medical Work up  Expected Discharge Plan and Services Expected Discharge Plan: Home/Self Care   Discharge Planning Services: CM Consult   Living arrangements for the past 2 months: Single Family Home                                       Social Determinants of Health (SDOH) Interventions    Readmission Risk Interventions     No data to display

## 2021-10-11 NOTE — Telephone Encounter (Signed)
Patient Advocate Encounter  Completed [employer] Patient Chief of Staff for [employer] 24-26 mg for this patient who is uninsured.      This encounter will be updated until final determination.   Roland Earl, CPhT Pharmacy Patient Advocate Specialist [facility] Patient Advocate Team Direct Number: [phone]  Fax: [phone]

## 2021-10-11 NOTE — Progress Notes (Addendum)
Advanced Heart Failure Rounding Note  PCP-Cardiologist: Bryan Lemma, MD   Subjective:    Going for [facility] today  3.1L UOP yesterday with IV lasix. Weight down 3 lb.  Feeling well. No dyspnea, orthopnea or PND.    Objective:   Weight Range: 115.3 kg Body mass index is 32.64 kg/m.   Vital Signs:   Temp:  [98.2 F (36.8 C)-99.2 F (37.3 C)] 98.6 F (37 C) (07/26 0510) Pulse Rate:  [87-100] 87 (07/26 0756) Resp:  [16-18] 18 (07/26 0756) BP: (107-135)/(71-103) 131/96 (07/26 0756) SpO2:  [96 %-100 %] 100 % (07/26 0756) Weight:  [115.3 kg-116.8 kg] 115.3 kg (07/26 0510) Last BM Date : 10/09/21  Weight change: Filed Weights   10/09/21 1837 10/10/21 0939 10/11/21 0510  Weight: 118.8 kg 116.8 kg 115.3 kg    Intake/Output:   Intake/Output Summary (Last 24 hours) at 10/11/2021 0848 Last data filed at 10/11/2021 0800 Gross per 24 hour  Intake 966.04 ml  Output 3150 ml  Net -2183.96 ml      Physical Exam    General:  Well appearing. Sitting up on side of bed. HEENT: Normal Neck: Supple. JVP 7-8. Carotids 2+ bilat; no bruits.  Cor: PMI nondisplaced. Regular rhythm with ectopy, tachy. No rubs, gallops or murmurs. Lungs: Clear Abdomen: Soft, nontender, nondistended. Extremities: No cyanosis, clubbing, rash, edema Neuro: Alert & orientedx3, cranial nerves grossly intact. moves all 4 extremities w/o difficulty. Affect pleasant   Telemetry   SR/sinus tach 90s-100s, more PVCs today ~ 10/min  Labs    CBC Recent Labs    10/09/21 1845 10/11/21 0125  WBC 10.4 10.8*  HGB 14.8 14.9  HCT 43.7 44.5  MCV 88.8 88.8  PLT 245 221   Basic Metabolic Panel Recent Labs    67/12/45 2038 10/09/21 2044 10/10/21 1004 10/11/21 0125  NA  --    < > 140 139  K  --    < > 3.5 3.2*  CL  --    < > 102 101  CO2  --    < > 28 28  GLUCOSE  --    < > 114* 119*  BUN  --    < > 7 7  CREATININE  --    < > 1.09 0.93  CALCIUM  --    < > 8.8* 8.5*  MG 1.9  --   --  1.7   < > =  values in this interval not displayed.   Liver Function Tests Recent Labs    10/09/21 2044  AST 27  ALT 14  ALKPHOS 63  BILITOT 2.9*  PROT 7.3  ALBUMIN 4.1   Recent Labs    10/09/21 2044  LIPASE 53*   Cardiac Enzymes No results for input(s): "CKTOTAL", "CKMB", "CKMBINDEX", "TROPONINI" in the last 72 hours.  BNP: BNP (last 3 results) Recent Labs    10/09/21 1845  BNP 1,097.4*    ProBNP (last 3 results) No results for input(s): "PROBNP" in the last 8760 hours.   D-Dimer No results for input(s): "DDIMER" in the last 72 hours. Hemoglobin A1C Recent Labs    10/10/21 1004  HGBA1C 5.9*   Fasting Lipid Panel Recent Labs    10/11/21 0125  CHOL 175  HDL 24*  LDLCALC 130*  TRIG 106  CHOLHDL 7.3   Thyroid Function Tests Recent Labs    10/10/21 1240  TSH 1.699    Other results:   Imaging    ECHOCARDIOGRAM COMPLETE  Result Date:  10/10/2021    ECHOCARDIOGRAM REPORT   Patient Name:   Reginald Fernandez Date of Exam: 10/10/2021 Medical Rec #:  [id number]      Height:       74.0 in Accession #:    [id number]     Weight:       257.5 lb Date of Birth:  Oct 19, 1980      BSA:          2.420 m Patient Age:    41 years       BP:           137/90 mmHg Patient Gender: M              HR:           98 bpm. Exam Location:  Inpatient Procedure: 2D Echo, Cardiac Doppler, Color Doppler and Intracardiac            Opacification Agent Indications:    CHF  History:        Patient has prior history of Echocardiogram examinations.                 Previous Myocardial Infarction and CAD; Risk                 Factors:Hypertension.  Sonographer:    Cleatis Polka Referring Phys: 59 BRITTAINY M SIMMONS IMPRESSIONS  1. Left ventricular ejection fraction, by estimation, is <20%. The left ventricle has severely decreased function. The left ventricle demonstrates global hypokinesis. The left ventricular internal cavity size was severely dilated. There is mild left ventricular hypertrophy. Left  ventricular diastolic parameters are indeterminate.  2. Right ventricular systolic function is severely reduced. The right ventricular size is normal.  3. Left atrial size was mildly dilated.  4. Right atrial size was mild to moderately dilated.  5. The mitral valve is normal in structure. Mild mitral valve regurgitation.  6. Tricuspid valve regurgitation is mild to moderate.  7. The aortic valve is tricuspid. Aortic valve regurgitation is not visualized. Conclusion(s)/Recommendation(s): Compared to prior study 12/23/2019, LV and RV function have declined. FINDINGS  Left Ventricle: Left ventricular ejection fraction, by estimation, is <20%. The left ventricle has severely decreased function. The left ventricle demonstrates global hypokinesis. Definity contrast agent was given IV to delineate the left ventricular endocardial borders. The left ventricular internal cavity size was severely dilated. There is mild left ventricular hypertrophy. Left ventricular diastolic parameters are indeterminate. Right Ventricle: The right ventricular size is normal. Right vetricular wall thickness was not well visualized. Right ventricular systolic function is severely reduced. Left Atrium: Left atrial size was mildly dilated. Right Atrium: Right atrial size was mild to moderately dilated. Pericardium: There is no evidence of pericardial effusion. Mitral Valve: The mitral valve is normal in structure. Mild mitral valve regurgitation. Tricuspid Valve: The tricuspid valve is normal in structure. Tricuspid valve regurgitation is mild to moderate. Aortic Valve: The aortic valve is tricuspid. Aortic valve regurgitation is not visualized. Aortic valve peak gradient measures 6.0 mmHg. Pulmonic Valve: The pulmonic valve was not well visualized. Pulmonic valve regurgitation is mild. Aorta: The aortic root and ascending aorta are structurally normal, with no evidence of dilitation. IAS/Shunts: The interatrial septum was not well visualized.   LEFT VENTRICLE PLAX 2D LVIDd:         6.80 cm      Diastology LVIDs:         6.20 cm      LV e' medial:    5.00 cm/s  LV PW:         1.10 cm      LV E/e' medial:  16.9 LV IVS:        1.10 cm      LV e' lateral:   7.07 cm/s LVOT diam:     2.00 cm      LV E/e' lateral: 11.9 LV SV:         28 LV SV Index:   12 LVOT Area:     3.14 cm  LV Volumes (MOD) LV vol d, MOD A2C: 367.0 ml LV vol d, MOD A4C: 298.0 ml LV vol s, MOD A2C: 282.0 ml LV vol s, MOD A4C: 233.0 ml LV SV MOD A2C:     85.0 ml LV SV MOD A4C:     298.0 ml LV SV MOD BP:      70.2 ml RIGHT VENTRICLE            IVC RV Basal diam:  3.90 cm    IVC diam: 2.50 cm RV Mid diam:    3.40 cm RV S prime:     7.72 cm/s TAPSE (M-mode): 1.3 cm LEFT ATRIUM             Index        RIGHT ATRIUM           Index LA diam:        5.40 cm 2.23 cm/m   RA Area:     30.80 cm LA Vol (A2C):   91.8 ml 37.94 ml/m  RA Volume:   122.00 ml 50.42 ml/m LA Vol (A4C):   81.2 ml 33.56 ml/m LA Biplane Vol: 87.2 ml 36.03 ml/m  AORTIC VALVE AV Area (Vmax): 1.71 cm AV Vmax:        122.00 cm/s AV Peak Grad:   6.0 mmHg LVOT Vmax:      66.30 cm/s LVOT Vmean:     51.100 cm/s LVOT VTI:       0.089 m  AORTA Ao Root diam: 3.30 cm Ao Asc diam:  3.20 cm MITRAL VALVE               TRICUSPID VALVE MV Area (PHT): 6.65 cm    TR Peak grad:   31.8 mmHg MV Decel Time: 114 msec    TR Vmax:        282.00 cm/s MR Peak grad: 72.8 mmHg MR Mean grad: 51.0 mmHg    SHUNTS MR Vmax:      426.50 cm/s  Systemic VTI:  0.09 m MR Vmean:     350.0 cm/s   Systemic Diam: 2.00 cm MV E velocity: 84.40 cm/s Placido Sou signed by Phineas Inches Signature Date/Time: 10/10/2021/3:33:21 PM    Final      Medications:     Scheduled Medications:  [START ON 10/12/2021] aspirin  81 mg Oral Daily   atorvastatin  80 mg Oral Daily   dapagliflozin propanediol  10 mg Oral Daily   potassium chloride  40 mEq Oral Once   sacubitril-valsartan  1 tablet Oral BID   sodium chloride flush  3 mL Intravenous Q12H   [START ON  10/12/2021] spironolactone  25 mg Oral Daily    Infusions:  sodium chloride     sodium chloride 10 mL/hr at 10/11/21 0641   magnesium sulfate bolus IVPB      PRN Medications: sodium chloride, sodium chloride flush    Patient Profile   41 y/o male w/ h/o premature CAD s/p  multiple MIs, initial inferior STEMI in 2011, HTN,  HLD, tobacco use and poor compliance. Admitted with a/c systolic CHF.    Assessment/Plan   1. Acute on chronic systolic CHF: Ischemic cardiomyopathy.  ? Component of NICM d/t HTN +/- PVCs. Last echo in 10/21, EF 30-35%.  EF as low as 20-25% in 8/20.  Echo this admit: EF < 20%, RV severely reduced - Diuresed with IV lasix - Continue Entresto 24/26 mg BID, hopefully can increase.  - Increase spiro to 25 mg daily - Continue Farxiga 10 mg daily, A1c 5.9% - Volume status looks better today, with increased PVCs will add Coreg 3.125 mg bid.  - RHC today to assess filling pressures and CO - Discussed importance of adherence with medical therapy. Appears willing to take medications and f/u after discharge. 2. CAD: Strong family history of premature CAD.  H/o inferolateral STEMI in 9/15 with DES to LCx into OM1 and DES to RCA.  He has had chest tightness along with dyspnea with exertion.  HS-TnI was only minimally elevated with no trend suggesting demand ischemia from volume overload.  However, with worsening HF symptoms over the last month, I worry that he has developed progressive coronary disease.  LHC planned for today to reassess. - Continue 81 mg aspirin + 80 mg atorvastatin daily 3. PVCs:  - Increased burden today. ~ 10/min on tele - Supp K and Mag - Add Coreg as above.  3. HTN: BP improving. Will continue to titrate GDMT. Meds as above. 4. Hyperlipidemia: Goal LDL < 55, has been off statin.  - LDL 130 - Restarted statin 5. Fatty liver disease: Noted on CT abdomen.  6. Hypokalemia/hypomagnesemia: K 3.2 and Mag 1.7 - Supp today  SDOH:  - Uninsured. Missed  enrollment date for insurance through his employer. HF TOC CM/SW consulted.  - PharmD assisting with patient assistance applications for [place]. Will need other HF medications via HF fund.   Length of Stay: 1  FINCH, LINDSAY N, PA-C  10/11/2021, 8:48 AM  Advanced Heart Failure Team Pager [phone] (M-F; 7a - 5p)  Please contact [facility] for night-coverage after hours (5p -7a ) and weekends on [email or web address]   Patient seen with PA, agree with the above note.   He diuresed well yesterday.  Breathing is much better.  No chest pain.  Creatinine stable.   Echo with EF < 20%, severe LV dilation, RV severe dysfunction.   General: NAD Neck: No JVD, no thyromegaly or thyroid nodule.  Lungs: Clear to auscultation bilaterally with normal respiratory effort. CV: Nondisplaced PMI.  Heart regular S1/S2, no S3/S4, no murmur.  No peripheral edema.   Abdomen: Soft, nontender, no hepatosplenomegaly, no distention.  Skin: Intact without lesions or rashes.  Neurologic: Alert and oriented x 3.  Psych: Normal affect. Extremities: No clubbing or cyanosis.  HEENT: Normal.   Symptomatically much improved and volume status looks close to euvolemic on exam. Echo showed LV EF < 20%, severe LV dilation, severe RV dysfunction.  This is worse than prior.  - Start Farxiga 10 mg daily.  - Increase spironolactone to 25 mg daily.  - Add Coreg 3.125 mg bid (volume status improved/euvolemic and frequent PVCs).   - Continue Entresto.  - RHC/LHC today.  Discussed risks/benefits with patient and he agrees to procedure.  - Will hold off on Lasix until after we see RHC results.   Loralie Champagne. 10/11/2021 10:00 AM

## 2021-10-11 NOTE — Progress Notes (Signed)
RN removed r radial TR band. Site is clean, dry and intact with no signs of hematoma. R brachial site is clean dry and intact. No signs of hematoma. TR band site is covered with gauze and Tegaderm. Restricted extremity band applied.

## 2021-10-11 NOTE — H&P (View-Only) (Signed)
Advanced Heart Failure Rounding Note  PCP-Cardiologist: Bryan Lemma, MD   Subjective:    Going for [facility] today  3.1L UOP yesterday with IV lasix. Weight down 3 lb.  Feeling well. No dyspnea, orthopnea or PND.    Objective:   Weight Range: 115.3 kg Body mass index is 32.64 kg/m.   Vital Signs:   Temp:  [98.2 F (36.8 C)-99.2 F (37.3 C)] 98.6 F (37 C) (07/26 0510) Pulse Rate:  [87-100] 87 (07/26 0756) Resp:  [16-18] 18 (07/26 0756) BP: (107-135)/(71-103) 131/96 (07/26 0756) SpO2:  [96 %-100 %] 100 % (07/26 0756) Weight:  [115.3 kg-116.8 kg] 115.3 kg (07/26 0510) Last BM Date : 10/09/21  Weight change: Filed Weights   10/09/21 1837 10/10/21 0939 10/11/21 0510  Weight: 118.8 kg 116.8 kg 115.3 kg    Intake/Output:   Intake/Output Summary (Last 24 hours) at 10/11/2021 0848 Last data filed at 10/11/2021 0800 Gross per 24 hour  Intake 966.04 ml  Output 3150 ml  Net -2183.96 ml      Physical Exam    General:  Well appearing. Sitting up on side of bed. HEENT: Normal Neck: Supple. JVP 7-8. Carotids 2+ bilat; no bruits.  Cor: PMI nondisplaced. Regular rhythm with ectopy, tachy. No rubs, gallops or murmurs. Lungs: Clear Abdomen: Soft, nontender, nondistended. Extremities: No cyanosis, clubbing, rash, edema Neuro: Alert & orientedx3, cranial nerves grossly intact. moves all 4 extremities w/o difficulty. Affect pleasant   Telemetry   SR/sinus tach 90s-100s, more PVCs today ~ 10/min  Labs    CBC Recent Labs    10/09/21 1845 10/11/21 0125  WBC 10.4 10.8*  HGB 14.8 14.9  HCT 43.7 44.5  MCV 88.8 88.8  PLT 245 221   Basic Metabolic Panel Recent Labs    67/12/45 2038 10/09/21 2044 10/10/21 1004 10/11/21 0125  NA  --    < > 140 139  K  --    < > 3.5 3.2*  CL  --    < > 102 101  CO2  --    < > 28 28  GLUCOSE  --    < > 114* 119*  BUN  --    < > 7 7  CREATININE  --    < > 1.09 0.93  CALCIUM  --    < > 8.8* 8.5*  MG 1.9  --   --  1.7   < > =  values in this interval not displayed.   Liver Function Tests Recent Labs    10/09/21 2044  AST 27  ALT 14  ALKPHOS 63  BILITOT 2.9*  PROT 7.3  ALBUMIN 4.1   Recent Labs    10/09/21 2044  LIPASE 53*   Cardiac Enzymes No results for input(s): "CKTOTAL", "CKMB", "CKMBINDEX", "TROPONINI" in the last 72 hours.  BNP: BNP (last 3 results) Recent Labs    10/09/21 1845  BNP 1,097.4*    ProBNP (last 3 results) No results for input(s): "PROBNP" in the last 8760 hours.   D-Dimer No results for input(s): "DDIMER" in the last 72 hours. Hemoglobin A1C Recent Labs    10/10/21 1004  HGBA1C 5.9*   Fasting Lipid Panel Recent Labs    10/11/21 0125  CHOL 175  HDL 24*  LDLCALC 130*  TRIG 106  CHOLHDL 7.3   Thyroid Function Tests Recent Labs    10/10/21 1240  TSH 1.699    Other results:   Imaging    ECHOCARDIOGRAM COMPLETE  Result Date:  10/10/2021    ECHOCARDIOGRAM REPORT   Patient Name:   Reginald Fernandez Date of Exam: 10/10/2021 Medical Rec #:  [id number]      Height:       74.0 in Accession #:    [id number]     Weight:       257.5 lb Date of Birth:  Oct 19, 1980      BSA:          2.420 m Patient Age:    41 years       BP:           137/90 mmHg Patient Gender: M              HR:           98 bpm. Exam Location:  Inpatient Procedure: 2D Echo, Cardiac Doppler, Color Doppler and Intracardiac            Opacification Agent Indications:    CHF  History:        Patient has prior history of Echocardiogram examinations.                 Previous Myocardial Infarction and CAD; Risk                 Factors:Hypertension.  Sonographer:    Cleatis Polka Referring Phys: 59 BRITTAINY M SIMMONS IMPRESSIONS  1. Left ventricular ejection fraction, by estimation, is <20%. The left ventricle has severely decreased function. The left ventricle demonstrates global hypokinesis. The left ventricular internal cavity size was severely dilated. There is mild left ventricular hypertrophy. Left  ventricular diastolic parameters are indeterminate.  2. Right ventricular systolic function is severely reduced. The right ventricular size is normal.  3. Left atrial size was mildly dilated.  4. Right atrial size was mild to moderately dilated.  5. The mitral valve is normal in structure. Mild mitral valve regurgitation.  6. Tricuspid valve regurgitation is mild to moderate.  7. The aortic valve is tricuspid. Aortic valve regurgitation is not visualized. Conclusion(s)/Recommendation(s): Compared to prior study 12/23/2019, LV and RV function have declined. FINDINGS  Left Ventricle: Left ventricular ejection fraction, by estimation, is <20%. The left ventricle has severely decreased function. The left ventricle demonstrates global hypokinesis. Definity contrast agent was given IV to delineate the left ventricular endocardial borders. The left ventricular internal cavity size was severely dilated. There is mild left ventricular hypertrophy. Left ventricular diastolic parameters are indeterminate. Right Ventricle: The right ventricular size is normal. Right vetricular wall thickness was not well visualized. Right ventricular systolic function is severely reduced. Left Atrium: Left atrial size was mildly dilated. Right Atrium: Right atrial size was mild to moderately dilated. Pericardium: There is no evidence of pericardial effusion. Mitral Valve: The mitral valve is normal in structure. Mild mitral valve regurgitation. Tricuspid Valve: The tricuspid valve is normal in structure. Tricuspid valve regurgitation is mild to moderate. Aortic Valve: The aortic valve is tricuspid. Aortic valve regurgitation is not visualized. Aortic valve peak gradient measures 6.0 mmHg. Pulmonic Valve: The pulmonic valve was not well visualized. Pulmonic valve regurgitation is mild. Aorta: The aortic root and ascending aorta are structurally normal, with no evidence of dilitation. IAS/Shunts: The interatrial septum was not well visualized.   LEFT VENTRICLE PLAX 2D LVIDd:         6.80 cm      Diastology LVIDs:         6.20 cm      LV e' medial:    5.00 cm/s  LV PW:         1.10 cm      LV E/e' medial:  16.9 LV IVS:        1.10 cm      LV e' lateral:   7.07 cm/s LVOT diam:     2.00 cm      LV E/e' lateral: 11.9 LV SV:         28 LV SV Index:   12 LVOT Area:     3.14 cm  LV Volumes (MOD) LV vol d, MOD A2C: 367.0 ml LV vol d, MOD A4C: 298.0 ml LV vol s, MOD A2C: 282.0 ml LV vol s, MOD A4C: 233.0 ml LV SV MOD A2C:     85.0 ml LV SV MOD A4C:     298.0 ml LV SV MOD BP:      70.2 ml RIGHT VENTRICLE            IVC RV Basal diam:  3.90 cm    IVC diam: 2.50 cm RV Mid diam:    3.40 cm RV S prime:     7.72 cm/s TAPSE (M-mode): 1.3 cm LEFT ATRIUM             Index        RIGHT ATRIUM           Index LA diam:        5.40 cm 2.23 cm/m   RA Area:     30.80 cm LA Vol (A2C):   91.8 ml 37.94 ml/m  RA Volume:   122.00 ml 50.42 ml/m LA Vol (A4C):   81.2 ml 33.56 ml/m LA Biplane Vol: 87.2 ml 36.03 ml/m  AORTIC VALVE AV Area (Vmax): 1.71 cm AV Vmax:        122.00 cm/s AV Peak Grad:   6.0 mmHg LVOT Vmax:      66.30 cm/s LVOT Vmean:     51.100 cm/s LVOT VTI:       0.089 m  AORTA Ao Root diam: 3.30 cm Ao Asc diam:  3.20 cm MITRAL VALVE               TRICUSPID VALVE MV Area (PHT): 6.65 cm    TR Peak grad:   31.8 mmHg MV Decel Time: 114 msec    TR Vmax:        282.00 cm/s MR Peak grad: 72.8 mmHg MR Mean grad: 51.0 mmHg    SHUNTS MR Vmax:      426.50 cm/s  Systemic VTI:  0.09 m MR Vmean:     350.0 cm/s   Systemic Diam: 2.00 cm MV E velocity: 84.40 cm/s Mary Branch Electronically signed by Mary Branch Signature Date/Time: 10/10/2021/3:33:21 PM    Final      Medications:     Scheduled Medications:  [START ON 10/12/2021] aspirin  81 mg Oral Daily   atorvastatin  80 mg Oral Daily   dapagliflozin propanediol  10 mg Oral Daily   potassium chloride  40 mEq Oral Once   sacubitril-valsartan  1 tablet Oral BID   sodium chloride flush  3 mL Intravenous Q12H   [START ON  10/12/2021] spironolactone  25 mg Oral Daily    Infusions:  sodium chloride     sodium chloride 10 mL/hr at 10/11/21 0641   magnesium sulfate bolus IVPB      PRN Medications: sodium chloride, sodium chloride flush    Patient Profile   41 y/o male w/ h/o premature CAD s/p   multiple MIs, initial inferior STEMI in 2011, HTN,  HLD, tobacco use and poor compliance. Admitted with a/c systolic CHF.    Assessment/Plan   1. Acute on chronic systolic CHF: Ischemic cardiomyopathy.  ? Component of NICM d/t HTN +/- PVCs. Last echo in 10/21, EF 30-35%.  EF as low as 20-25% in 8/20.  Echo this admit: EF < 20%, RV severely reduced - Diuresed with IV lasix - Continue Entresto 24/26 mg BID, hopefully can increase.  - Increase spiro to 25 mg daily - Continue Farxiga 10 mg daily, A1c 5.9% - Volume status looks better today, with increased PVCs will add Coreg 3.125 mg bid.  - RHC today to assess filling pressures and CO - Discussed importance of adherence with medical therapy. Appears willing to take medications and f/u after discharge. 2. CAD: Strong family history of premature CAD.  H/o inferolateral STEMI in 9/15 with DES to LCx into OM1 and DES to RCA.  He has had chest tightness along with dyspnea with exertion.  HS-TnI was only minimally elevated with no trend suggesting demand ischemia from volume overload.  However, with worsening HF symptoms over the last month, I worry that he has developed progressive coronary disease.  LHC planned for today to reassess. - Continue 81 mg aspirin + 80 mg atorvastatin daily 3. PVCs:  - Increased burden today. ~ 10/min on tele - Supp K and Mag - Add Coreg as above.  3. HTN: BP improving. Will continue to titrate GDMT. Meds as above. 4. Hyperlipidemia: Goal LDL < 55, has been off statin.  - LDL 130 - Restarted statin 5. Fatty liver disease: Noted on CT abdomen.  6. Hypokalemia/hypomagnesemia: K 3.2 and Mag 1.7 - Supp today  SDOH:  - Uninsured. Missed  enrollment date for insurance through his employer. HF TOC CM/SW consulted.  - PharmD assisting with patient assistance applications for Entresto and Farxiga. Will need other HF medications via HF fund.   Length of Stay: 1  FINCH, LINDSAY N, PA-C  10/11/2021, 8:48 AM  Advanced Heart Failure Team Pager [phone] (M-F; 7a - 5p)  Please contact [facility] for night-coverage after hours (5p -7a ) and weekends on [email or web address]   Patient seen with PA, agree with the above note.   He diuresed well yesterday.  Breathing is much better.  No chest pain.  Creatinine stable.   Echo with EF < 20%, severe LV dilation, RV severe dysfunction.   General: NAD Neck: No JVD, no thyromegaly or thyroid nodule.  Lungs: Clear to auscultation bilaterally with normal respiratory effort. CV: Nondisplaced PMI.  Heart regular S1/S2, no S3/S4, no murmur.  No peripheral edema.   Abdomen: Soft, nontender, no hepatosplenomegaly, no distention.  Skin: Intact without lesions or rashes.  Neurologic: Alert and oriented x 3.  Psych: Normal affect. Extremities: No clubbing or cyanosis.  HEENT: Normal.   Symptomatically much improved and volume status looks close to euvolemic on exam. Echo showed LV EF < 20%, severe LV dilation, severe RV dysfunction.  This is worse than prior.  - Start Farxiga 10 mg daily.  - Increase spironolactone to 25 mg daily.  - Add Coreg 3.125 mg bid (volume status improved/euvolemic and frequent PVCs).   - Continue Entresto.  - RHC/LHC today.  Discussed risks/benefits with patient and he agrees to procedure.  - Will hold off on Lasix until after we see RHC results.   Sevilla Murtagh. 10/11/2021 10:00 AM  

## 2021-10-11 NOTE — Interval H&P Note (Signed)
History and Physical Interval Note:  10/11/2021 11:23 AM  Reginald Fernandez  has presented today for surgery, with the diagnosis of heart failure.  The various methods of treatment have been discussed with the patient and family. After consideration of risks, benefits and other options for treatment, the patient has consented to  Procedure(s): RIGHT/LEFT HEART CATH AND CORONARY ANGIOGRAPHY (N/A) as a surgical intervention.  The patient's history has been reviewed, patient examined, no change in status, stable for surgery.  I have reviewed the patient's chart and labs.  Questions were answered to the patient's satisfaction.     Artavious Trebilcock [employer]

## 2021-10-11 NOTE — Plan of Care (Signed)
  Problem: Education: Goal: Knowledge of General Education information will improve Description: Including pain rating scale, medication(s)/side effects and non-pharmacologic comfort measures Outcome: Progressing   Problem: Clinical Measurements: Goal: Ability to maintain clinical measurements within normal limits will improve Outcome: Progressing   

## 2021-10-11 NOTE — Telephone Encounter (Signed)
Advanced Heart Failure Patient Advocate Encounter  The patient is currently uninsured. Will utilize HF fund for [place], rather than applying for assistance.  No further action needed at this time.  Archer Asa, CPhT

## 2021-10-12 ENCOUNTER — Other Ambulatory Visit (HOSPITAL_COMMUNITY): Payer: Self-pay

## 2021-10-12 ENCOUNTER — Encounter (HOSPITAL_COMMUNITY): Payer: Self-pay | Admitting: Cardiology

## 2021-10-12 ENCOUNTER — Encounter (HOSPITAL_COMMUNITY): Payer: Self-pay

## 2021-10-12 LAB — CBC
HCT: 48.8 % (ref 39.0–52.0)
Hemoglobin: 16.6 g/dL (ref 13.0–17.0)
MCH: 29.9 pg (ref 26.0–34.0)
MCHC: 34 g/dL (ref 30.0–36.0)
MCV: 87.9 fL (ref 80.0–100.0)
Platelets: 260 10*3/uL (ref 150–400)
RBC: 5.55 MIL/uL (ref 4.22–5.81)
RDW: 13.5 % (ref 11.5–15.5)
WBC: 12 10*3/uL — ABNORMAL HIGH (ref 4.0–10.5)
nRBC: 0 % (ref 0.0–0.2)

## 2021-10-12 LAB — BASIC METABOLIC PANEL
Anion gap: 10 (ref 5–15)
BUN: 9 mg/dL (ref 6–20)
CO2: 24 mmol/L (ref 22–32)
Calcium: 8.6 mg/dL — ABNORMAL LOW (ref 8.9–10.3)
Chloride: 103 mmol/L (ref 98–111)
Creatinine, Ser: 0.95 mg/dL (ref 0.61–1.24)
GFR, Estimated: >60 mL/min (ref >60)
Glucose, Bld: 130 mg/dL — ABNORMAL HIGH (ref 70–99)
Potassium: 4 mmol/L (ref 3.5–5.1)
Sodium: 137 mmol/L (ref 135–145)

## 2021-10-12 LAB — MAGNESIUM: Magnesium: 2.1 mg/dL (ref 1.7–2.4)

## 2021-10-12 MED ORDER — SACUBITRIL-VALSARTAN 49-51 MG PO TABS
1.0000 | ORAL_TABLET | Freq: Two times a day (BID) | ORAL | Status: DC
  Administered 2021-10-12: 1 via ORAL
  Filled 2021-10-12: qty 1

## 2021-10-12 MED ORDER — SPIRONOLACTONE 25 MG PO TABS
25.0000 mg | ORAL_TABLET | Freq: Every day | ORAL | 5 refills | Status: DC
  Filled 2021-10-12: qty 30, 30d supply, fill #0

## 2021-10-12 MED ORDER — FUROSEMIDE 10 MG/ML IJ SOLN
40.0000 mg | Freq: Once | INTRAMUSCULAR | Status: AC
Start: 2021-10-12 — End: 2021-10-12
  Administered 2021-10-12: 40 mg via INTRAVENOUS
  Filled 2021-10-12: qty 4

## 2021-10-12 MED ORDER — DAPAGLIFLOZIN PROPANEDIOL 10 MG PO TABS
10.0000 mg | ORAL_TABLET | Freq: Every day | ORAL | 5 refills | Status: DC
  Filled 2021-10-12: qty 30, 30d supply, fill #0

## 2021-10-12 MED ORDER — EZETIMIBE 10 MG PO TABS
10.0000 mg | ORAL_TABLET | Freq: Every day | ORAL | 5 refills | Status: DC
  Filled 2021-10-12: qty 30, 30d supply, fill #0

## 2021-10-12 MED ORDER — CARVEDILOL 3.125 MG PO TABS
3.1250 mg | ORAL_TABLET | Freq: Two times a day (BID) | ORAL | 5 refills | Status: DC
  Filled 2021-10-12: qty 60, 30d supply, fill #0

## 2021-10-12 MED ORDER — NITROGLYCERIN 0.4 MG SL SUBL
0.4000 mg | SUBLINGUAL_TABLET | SUBLINGUAL | 2 refills | Status: AC | PRN
  Filled 2021-10-12: qty 25, 8d supply, fill #0

## 2021-10-12 MED ORDER — SACUBITRIL-VALSARTAN 49-51 MG PO TABS
1.0000 | ORAL_TABLET | Freq: Two times a day (BID) | ORAL | 5 refills | Status: DC
  Filled 2021-10-12: qty 60, 30d supply, fill #0

## 2021-10-12 MED ORDER — ASPIRIN 81 MG PO TBEC
81.0000 mg | DELAYED_RELEASE_TABLET | Freq: Every day | ORAL | 5 refills | Status: DC
  Filled 2021-10-12: qty 30, 30d supply, fill #0

## 2021-10-12 MED ORDER — FUROSEMIDE 20 MG PO TABS: 20.0000 mg | ORAL_TABLET | Freq: Every day | ORAL | Status: DC

## 2021-10-12 MED ORDER — ATORVASTATIN CALCIUM 80 MG PO TABS
80.0000 mg | ORAL_TABLET | Freq: Every day | ORAL | 3 refills | Status: DC
  Filled 2021-10-12: qty 90, 90d supply, fill #0

## 2021-10-12 MED ORDER — FUROSEMIDE 20 MG PO TABS
20.0000 mg | ORAL_TABLET | Freq: Every day | ORAL | 5 refills | Status: DC
  Filled 2021-10-12: qty 30, 30d supply, fill #0

## 2021-10-12 NOTE — Progress Notes (Addendum)
CARDIAC REHAB PHASE I      Pt ambulating in room independently. He has been up to bathroom and moving around room well with no SOB. Pt feels steady on his feet. Heart failure education including CHF booklet, low sodium heart healthy diet, cath site care, restrictions, exercise guidelines and CRP2. Pt is interested in CRP2. Referral sent to [facility].All questions and concerns addressed. Plan for home today.   [id number]  Woodroe Chen, RN BSN 10/12/2021 10:28 AM

## 2021-10-12 NOTE — Progress Notes (Unsigned)
Medication Samples have been provided to the patient.  Drug name: Sherryll Burger       Strength: 49/51        Qty: 2  LOT: [id number]  Exp.Date: 09/2022  Dosing instructions: Take 1 tablet Twice daily   The patient has been instructed regarding the correct time, dose, and frequency of taking this medication, including desired effects and most common side effects.   Smitty Cords Chanise Habeck 9:10 AM 10/12/2021

## 2021-10-12 NOTE — Progress Notes (Addendum)
Advanced Heart Failure Rounding Note  PCP-Cardiologist: Bryan Lemma, MD   Subjective:    R/LHC yesterday w/ diffuse CAD but no severe stenosis to explain worsening of his EF, mildly elevated PCWP w/ pulmonary venous hypertension, normal RA pressure and preserved CO.   Additional 3.5 L in UOP yesterday. Wt down 1 lb, 9 lb overall. Still appears mildly fluid overloaded on exam w/ elevated JVD SCr 0.95  Continues w/ frequent PVCs, variable overnight w/ as many as 15/min K 4.0, Mg pending   WBC trending up, 10.8>>12.0. AF. No infectious symptoms.   Overall feels better. Dyspnea improving.   Cardiac Studies  Echo: EF < 20%, severe LV dilation, RV severe dysfunction.   R/LHC: Hemodynamics (mmHg) RA mean 4 RV 36/3 PA 43/22, mean 31 PCWP mean 16 LV 136/23 AO 135/86  Oxygen saturations: PA 72% AO 93%  Cardiac Output (Fick) 7.57  Cardiac Index (Fick) 3.12 PVR 2 WU    1st Diag lesion is 60% stenosed.   Prox RCA-1 lesion is 55% stenosed.   Mid RCA lesion is 40% stenosed.   Prox RCA-2 lesion is 30% stenosed.   Mid LAD lesion is 40% stenosed.   Prox Cx to Dist Cx lesion is 50% stenosed.   Non-stenotic Prox Cx lesion was previously treated.   Non-stenotic Prox RCA to Mid RCA lesion was previously treated.   Non-stenotic Dist RCA lesion was previously treated.   Non-stenotic 1st Mrg lesion was previously treated.  Objective:   Weight Range: 115.2 kg Body mass index is 32.6 kg/m.   Vital Signs:   Temp:  [97.6 F (36.4 C)-98.4 F (36.9 C)] 98.4 F (36.9 C) (07/27 0400) Pulse Rate:  [84-110] 87 (07/27 0400) Resp:  [15-31] 20 (07/27 0400) BP: (84-142)/(55-107) 129/96 (07/27 0400) SpO2:  [90 %-100 %] 93 % (07/27 0400) Weight:  [115.2 kg] 115.2 kg (07/27 0400) Last BM Date : 10/10/21  Weight change: Filed Weights   10/10/21 0939 10/11/21 0510 10/12/21 0400  Weight: 116.8 kg 115.3 kg 115.2 kg    Intake/Output:   Intake/Output Summary (Last 24 hours) at  10/12/2021 0716 Last data filed at 10/12/2021 0400 Gross per 24 hour  Intake 520.33 ml  Output 3475 ml  Net -2954.67 ml      Physical Exam    General:  Well appearing. No respiratory difficulty HEENT: normal Neck: supple. JVD 9 cm. Carotids 2+ bilat; no bruits. No lymphadenopathy or thyromegaly appreciated. Cor: PMI nondisplaced. Regular rate & rhythm. No rubs, gallops or murmurs. Lungs: clear Abdomen: soft, nontender, nondistended. No hepatosplenomegaly. No bruits or masses. Good bowel sounds. Extremities: no cyanosis, clubbing, rash, edema Neuro: alert & oriented x 3, cranial nerves grossly intact. moves all 4 extremities w/o difficulty. Affect pleasant.  Telemetry   NSR w/ frequent PVCs, variable 0-15/min overnight   Labs    CBC Recent Labs    10/11/21 0125 10/11/21 1138 10/12/21 0137  WBC 10.8*  --  12.0*  HGB 14.9 16.7  16.7 16.6  HCT 44.5 49.0  49.0 48.8  MCV 88.8  --  87.9  PLT 221  --  260   Basic Metabolic Panel Recent Labs    37/10/62 2038 10/09/21 2044 10/11/21 0125 10/11/21 1138 10/12/21 0137  NA  --    < > 139 141  141 137  K  --    < > 3.2* 3.6  3.7 4.0  CL  --    < > 101  --  103  CO2  --    < >  28  --  24  GLUCOSE  --    < > 119*  --  130*  BUN  --    < > 7  --  9  CREATININE  --    < > 0.93  --  0.95  CALCIUM  --    < > 8.5*  --  8.6*  MG 1.9  --  1.7  --   --    < > = values in this interval not displayed.   Liver Function Tests Recent Labs    10/09/21 2044  AST 27  ALT 14  ALKPHOS 63  BILITOT 2.9*  PROT 7.3  ALBUMIN 4.1   Recent Labs    10/09/21 2044  LIPASE 53*   Cardiac Enzymes No results for input(s): "CKTOTAL", "CKMB", "CKMBINDEX", "TROPONINI" in the last 72 hours.  BNP: BNP (last 3 results) Recent Labs    10/09/21 1845  BNP 1,097.4*    ProBNP (last 3 results) No results for input(s): "PROBNP" in the last 8760 hours.   D-Dimer No results for input(s): "DDIMER" in the last 72 hours. Hemoglobin  A1C Recent Labs    10/10/21 1004  HGBA1C 5.9*   Fasting Lipid Panel Recent Labs    10/11/21 0125  CHOL 175  HDL 24*  LDLCALC 130*  TRIG 106  CHOLHDL 7.3   Thyroid Function Tests Recent Labs    10/10/21 1240  TSH 1.699    Other results:   Imaging    CARDIAC CATHETERIZATION  Result Date: 10/12/2021   1st Diag lesion is 60% stenosed.   Prox RCA-1 lesion is 55% stenosed.   Mid RCA lesion is 40% stenosed.   Prox RCA-2 lesion is 30% stenosed.   Mid LAD lesion is 40% stenosed.   Prox Cx to Dist Cx lesion is 50% stenosed.   Non-stenotic Prox Cx lesion was previously treated.   Non-stenotic Prox RCA to Mid RCA lesion was previously treated.   Non-stenotic Dist RCA lesion was previously treated.   Non-stenotic 1st Mrg lesion was previously treated. 1. Mildly elevated PCWP with pulmonary venous hypertension. 2. Normal RA pressure. 3. Preserved cardiac output. 4. Diffuse CAD but no severe stenoses that would explain worsening of EF.     Medications:     Scheduled Medications:  aspirin  81 mg Oral Daily   atorvastatin  80 mg Oral Daily   carvedilol  3.125 mg Oral BID WC   dapagliflozin propanediol  10 mg Oral Daily   enoxaparin (LOVENOX) injection  40 mg Subcutaneous Q24H   sacubitril-valsartan  1 tablet Oral BID   sodium chloride flush  3 mL Intravenous Q12H   sodium chloride flush  3 mL Intravenous Q12H   spironolactone  25 mg Oral Daily    Infusions:  sodium chloride      PRN Medications: sodium chloride, acetaminophen, ondansetron (ZOFRAN) IV, sodium chloride flush    Patient Profile   41 y/o male w/ h/o premature CAD s/p multiple MIs, initial inferior STEMI in 2011, HTN,  HLD, tobacco use and poor compliance. Admitted with a/c systolic CHF.    Assessment/Plan   1. Acute on chronic systolic CHF: Ischemic cardiomyopathy.  ? Component of NICM d/t HTN +/- PVCs. Last echo in 10/21, EF 30-35%.  EF as low as 20-25% in 8/20.  Echo this admit: EF < 20%, RV severely  reduced. LHC this admit demonstrated diffuse CAD but no severe stenosis to explain worsening of his EF. RHC mildly elevated PCWP w/ pulmonary venous  hypertension, normal RA pressure and preserved CO - Increase Entresto to 49-51 mg bid  - Continue spiro 25 mg daily - Continue Farxiga 10 mg daily, A1c 5.9% - Continue Coreg 3.125 mg bid.  - Discussed importance of adherence with medical therapy. Appears willing to take medications and f/u after discharge. - Will likely need ICD, will repeat echo in couple of months for ICD.  Narrow QRS so not CRT candidate.  2. CAD: Strong family history of premature CAD.  H/o inferolateral STEMI in 9/15 with DES to LCx into OM1 and DES to RCA.  He has had chest tightness along with dyspnea with exertion.  HS-TnI was only minimally elevated with no trend suggesting demand ischemia from volume overload. LHC this admit demonstrated diffuse CAD but no severe stenosis to explain worsening of his EF.  - Continue 81 mg aspirin + 80 mg atorvastatin daily - Start on Zetia 10 mg daily also, was on in past.  3. PVCs:  - Variable overnight, 0-15/min - Continue Coreg  - K stable at 4. Check Mg level, replete if < 2.0  3. HTN: BP improving. Will continue to titrate GDMT. Meds as above. 4. Hyperlipidemia: Goal LDL < 55, has been off statin.  - LDL 130 - Restarted statin 5. Fatty liver disease: Noted on CT abdomen.  6. Hypokalemia/hypomagnesemia: K 4.0. Check Mg - Supp as needed  SDOH:  - Uninsured. Missed enrollment date for insurance through his employer. HF TOC CM/SW consulted.  - PharmD assisting with patient assistance applications for [place]. Will need other HF medications via HF fund.  Consult cardiac rehab.    Length of Stay: 2  Robbie Lis, PA-C  10/12/2021, 7:16 AM  Advanced Heart Failure Team Pager [phone] (M-F; 7a - 5p)  Please contact [facility] for night-coverage after hours (5p -7a ) and weekends on [email or web address]   Patient seen  with PA, agree with the above note.   Woke up once overnight with dyspnea.  Otherwise has felt good. I/Os negative again.  Tolerating his meds.   Cath yesterday with mildly elevated PCWP but normal RA pressure, preserved cardiac output.  Diffuse CAD with no focal severe stenosis.   General: NAD Neck: JVP 8 cm with HJR, no thyromegaly or thyroid nodule.  Lungs: Clear to auscultation bilaterally with normal respiratory effort. CV: Nondisplaced PMI.  Heart regular S1/S2, no S3/S4, no murmur.  No peripheral edema.   Abdomen: Soft, nontender, no hepatosplenomegaly, no distention.  Skin: Intact without lesions or rashes.  Neurologic: Alert and oriented x 3.  Psych: Normal affect. Extremities: No clubbing or cyanosis.  HEENT: Normal.   I think worsening of EF (<20%) is likely related to negative remodeling (no meds x 2 years) as no new severe coronary stenoses noted on angiography.  It will be very important for him to take all his meds and followup closely.  He has mild residual volume overload on exam.  - Increase Entresto to 49/51 bid.  - Will give Lasix 40 mg IV x 1 this morning then start Lasix 20 mg po daily tomorrow at home. - Continue other meds.  - Repeat echo on med therapy in a couple of months, will need ICD if remains low.  - Needs very close followup and will need assistance with meds.  - Can go home later today.   Marca Ancona 10/12/2021 8:05 AM

## 2021-10-12 NOTE — TOC Initial Note (Signed)
Transition of Care [facility]) - Initial/Assessment Note    Patient Details  Name: Reginald Fernandez MRN: [id number] Date of Birth: [birth year]  Transition of Care [facility]) CM/SW Contact:    Elliot Cousin, RN Phone Number: [phone] 10/12/2021, 9:47 AM  Clinical Narrative:                 HF TOC CM spoke to pt at bedside. States he will weigh daily. Pt states follows a vegan diet and watches his sodium intake. Provided pt with appt card and directions to HF clinic. Pt has PCP appt with [facility] on 8/3 at 1330.   Expected Discharge Plan: Home/Self Care Barriers to Discharge: No Barriers Identified   Patient Goals and CMS Choice Patient states their goals for this hospitalization and ongoing recovery are:: wants to stay well      Expected Discharge Plan and Services Expected Discharge Plan: Home/Self Care   Discharge Planning Services: CM Consult, Follow-up appt scheduled   Living arrangements for the past 2 months: Single Family Home Expected Discharge Date: 10/12/21                   Prior Living Arrangements/Services Living arrangements for the past 2 months: Single Family Home Lives with:: Spouse Patient language and need for interpreter reviewed:: Yes Do you feel safe going back to the place where you live?: Yes      Need for Family Participation in Patient Care: No (Comment) Care giver support system in place?: No (comment)   Criminal Activity/Legal Involvement Pertinent to Current Situation/Hospitalization: No - Comment as needed  Activities of Daily Living Home Assistive Devices/Equipment: None ADL Screening (condition at time of admission) Patient's cognitive ability adequate to safely complete daily activities?: Yes Is the patient deaf or have difficulty hearing?: No Does the patient have difficulty seeing, even when wearing glasses/contacts?: No Does the patient have difficulty concentrating, remembering, or making decisions?: No Patient  able to express need for assistance with ADLs?: Yes Does the patient have difficulty dressing or bathing?: No Independently performs ADLs?: Yes (appropriate for developmental age) Does the patient have difficulty walking or climbing stairs?: No Weakness of Legs: None Weakness of Arms/Hands: None  Permission Sought/Granted Permission sought to share information with : Case Manager, Family Supports, PCP Permission granted to share information with : Yes, Verbal Permission Granted  Share Information with NAME: Reginald Fernandez     Permission granted to share info w Relationship: mother  Permission granted to share info w Contact Information: [phone]  Emotional Assessment Appearance:: Appears stated age Attitude/Demeanor/Rapport: Engaged Affect (typically observed): Accepting Orientation: : Oriented to Self, Oriented to Place, Oriented to  Time, Oriented to Situation   Psych Involvement: No (comment)  Admission diagnosis:  CHF (congestive heart failure) (HCC) [I50.9] Leg edema [R60.0] Elevated troponin [R77.8] Pulmonary vascular congestion [R09.89] Acute on chronic congestive heart failure, unspecified heart failure type [facility]) [I50.9] Patient Active Problem List   Diagnosis Date Noted   CHF (congestive heart failure) (HCC) 10/09/2021   Educated about COVID-19 virus infection 12/06/2019   Prediabetes 10/25/2018   Dilated cardiomyopathy (HCC) 08/01/2016   Chronic combined systolic and diastolic heart failure (HCC) 08/01/2016   NSTEMI (non-ST elevated myocardial infarction) (HCC) 07/31/2016   Snoring 02/22/2014   Daytime somnolence 02/22/2014   Snores 11/23/2013   Marijuana abuse    Morbid obesity (HCC)    History of acute inferior wall MI 11/20/2013   ST elevation myocardial infarction (STEMI) of true posterior  wall, subsequent episode of care [facility]) 11/20/2013   CAD S/P percutaneous coronary angioplasty: DES PCI -Cx-OM1, RCA x 2 11/20/2013    Class: Status post   Essential  hypertension    Hyperlipidemia LDL goal <70    HYPERGLYCEMIA 10/25/2009   ASTHMA 10/24/2009   PCP:  Patient, No Pcp Per Pharmacy:   [facility] [address] [place] [address] Phone: [phone] Fax: [phone]  [facility] [address] [place] [address] Phone: [phone] Fax: [phone]  [id number] - [address], [place] - [address] [facility] [address] [facility] [address] [place] [address] Phone: [phone] Fax: [phone]  [facility] [address] [place] [address] Phone: [phone] Fax: [phone]     Social Determinants of Health (SDOH) Interventions    Readmission Risk Interventions     No data to display

## 2021-10-12 NOTE — Discharge Summary (Signed)
Advanced Heart Failure Team  Discharge Summary   Patient ID: Reginald Fernandez MRN: [id number], DOB/AGE: 1980-04-11 41 y.o. Admit date: 10/09/2021 D/C date:     10/12/2021   Primary Discharge Diagnoses:  Acute on Chronic Biventricular Heart Failure CAD HTN HLD Frequent PVCs  H/o Poor Compliance  Fatty Liver Disease    Hospital Course:  41 y/o male w/ h/o premature CAD s/p multiple MIs, initial inferior STEMI in 2011, HTN,  HLD, tobacco use and poor compliance.    H/o inferior STEMI by ECG in 09/2009 with non-obstructive CAD by cath @ that time.    Admitted 11/2013 w/ CP and inferolateral STE c/w STEMI. Emergent cath showed severe RCA and OM1 disease, both treated w/ PCI + DES. Echo showed normal EF, 55-60%. Lost to f/u.    Admitted 07/2016 w/ ACS. Trop I peaked 0.70. Cath showed two vessel disease with patent stents in the RCA, and OM of the Lcx with normal LVEDP. Unable to identify the culprit for Troponin elevation. He was restarted on plavix post cath. Follow up echo showed drop in EF down to 35-40% with basal anterolateral hypokinesis, and mid to apical akinesis, and G2DD. GDMT adjusted but unfortunately lost to f/u again.    Readmitted 10/2018 for left sided chest pain. Admitted to poor med compliance. Ruled in for NSTEMI. High-sensitivity troponin peaked 9,501. Cath demonstrated the following   Multivessel CAD with mild luminal irregularity of the LAD and evidence for 60% narrowing in a very small first diagonal vessel.  The second diagonal vessel bifurcates in its mid segment and there appears to be total occlusion of the inferior limb after the bifurcation.  (This was not able to be depicted on the diagram); the stent in the circumflex vessel which extends into the OM-1 vessel was widely patent and there is ostial pinching of the AV groove circumflex with narrowing of 50 to 60%; the RCA has luminal irregularity with 30% proximal and proximal to mid stenoses.  The mid RCA stent and distal  RCA stent is widely patent.   He was treated medically. Echo showed further reduction in LV systolic function w/ EF 0000000, GIIDD, RV mildly reduced.    Repeat Echo 10/21 showed slightly improved EF, 30-35%, RV normal.    Unfortunately, was lost to f/u again. Not seen by Cardiology since 2021. Has been off all prescription meds x 1 year.    Presented to ED on 7/24 w/ complaints of SOB + intermittent epigastric abdominal pain. Symptoms started several weeks ago and progressively worsened. BNP 1,097. CXR w/ cardiomegaly and pulmonary vascular congestion. Hs Trop 20>>18. EKG NSR w/ BAE, QRS 112 ms. UDS + for THC. Negative for cocaine. BP elevated 150s/low 100s.    CMP notable for elevated TBili, at 2.9. LFTs normal. Lipase mildly elevated at 53. CT of  A/P demonstrated diffuse fatty infiltration of the liver + mild gallbladder wall edema, possibly related to liver disease. No gallstones. Pancrease unremarkable.    He was given IV Lasix in the ED. Good response w/ 4L on UOP. AHF team asked to evalute.   He was diuresed w/ IV Lasix and GDMT re-initiated. Echo was done showing worsening of his EF to < 20%, severe LV dilation, and RV severe dysfunction.   R/LHC demonstrated diffuse CAD but no severe stenosis to explain worsening of his EF, mildly elevated PCWP w/ pulmonary venous hypertension, normal RA pressure and preserved CO.   He was diuresed further w/ IV Lasix and GDMT further optimized.  He was also noted to have frequent PVCs on tele and was started on low dose Coreg to help w/ suppression. He had no NSVT. He will need outpatient sleep study to assess for OSA.   On 7/27, he was last seen and examined by Dr. Aundra Dubin on 7/27 and felt stable for d/c home w/ close f/u in the [facility]. He will need repeat echo in 3 months and placement of ICD if EF remains < 35%.   Of note, he is uninsured and missed enrollment date for insurance through his employer. HF TOC CM/SW was consulted.  PharmD assisting with  patient assistance applications for [place]. Will need other HF medications via HF fund. Meds delivered by [facility].   Discharge Weight Range: 253 lb Discharge Vitals: Blood pressure (!) 129/96, pulse 87, temperature 98.4 F (36.9 C), temperature source Oral, resp. rate 20, height 6\' 2"  (1.88 m), weight 115.2 kg, SpO2 93 %.  Cardiac Studies  Echo: EF < 20%, severe LV dilation, RV severe dysfunction.    R/LHC: Hemodynamics (mmHg) RA mean 4 RV 36/3 PA 43/22, mean 31 PCWP mean 16 LV 136/23 AO 135/86  Oxygen saturations: PA 72% AO 93%  Cardiac Output (Fick) 7.57  Cardiac Index (Fick) 3.12 PVR 2 WU     1st Diag lesion is 60% stenosed.   Prox RCA-1 lesion is 55% stenosed.   Mid RCA lesion is 40% stenosed.   Prox RCA-2 lesion is 30% stenosed.   Mid LAD lesion is 40% stenosed.   Prox Cx to Dist Cx lesion is 50% stenosed.   Non-stenotic Prox Cx lesion was previously treated.   Non-stenotic Prox RCA to Mid RCA lesion was previously treated.   Non-stenotic Dist RCA lesion was previously treated.   Non-stenotic 1st Mrg lesion was previously treated.    Labs: Lab Results  Component Value Date   WBC 12.0 (H) 10/12/2021   HGB 16.6 10/12/2021   HCT 48.8 10/12/2021   MCV 87.9 10/12/2021   PLT 260 10/12/2021    Recent Labs  Lab 10/09/21 2044 10/10/21 1004 10/12/21 0137  NA 136   < > 137  K 4.2   < > 4.0  CL 102   < > 103  CO2 23   < > 24  BUN 10   < > 9  CREATININE 1.02   < > 0.95  CALCIUM 8.6*   < > 8.6*  PROT 7.3  --   --   BILITOT 2.9*  --   --   ALKPHOS 63  --   --   ALT 14  --   --   AST 27  --   --   GLUCOSE 144*   < > 130*   < > = values in this interval not displayed.   Lab Results  Component Value Date   CHOL 175 10/11/2021   HDL 24 (L) 10/11/2021   LDLCALC 130 (H) 10/11/2021   TRIG 106 10/11/2021   BNP (last 3 results) Recent Labs    10/09/21 1845  BNP 1,097.4*    ProBNP (last 3 results) No results for input(s): "PROBNP" in the  last 8760 hours.   Diagnostic Studies/Procedures   CARDIAC CATHETERIZATION  Result Date: 10/12/2021   1st Diag lesion is 60% stenosed.   Prox RCA-1 lesion is 55% stenosed.   Mid RCA lesion is 40% stenosed.   Prox RCA-2 lesion is 30% stenosed.   Mid LAD lesion is 40% stenosed.   Prox Cx to  Dist Cx lesion is 50% stenosed.   Non-stenotic Prox Cx lesion was previously treated.   Non-stenotic Prox RCA to Mid RCA lesion was previously treated.   Non-stenotic Dist RCA lesion was previously treated.   Non-stenotic 1st Mrg lesion was previously treated. 1. Mildly elevated PCWP with pulmonary venous hypertension. 2. Normal RA pressure. 3. Preserved cardiac output. 4. Diffuse CAD but no severe stenoses that would explain worsening of EF.   ECHOCARDIOGRAM COMPLETE  Result Date: 10/10/2021    ECHOCARDIOGRAM REPORT   Patient Name:   GARRELL LAVERS Date of Exam: 10/10/2021 Medical Rec #:  [id number]      Height:       74.0 in Accession #:    [id number]     Weight:       257.5 lb Date of Birth:  01/31/1981      BSA:          2.420 m Patient Age:    29 years       BP:           137/90 mmHg Patient Gender: M              HR:           98 bpm. Exam Location:  Inpatient Procedure: 2D Echo, Cardiac Doppler, Color Doppler and Intracardiac            Opacification Agent Indications:    CHF  History:        Patient has prior history of Echocardiogram examinations.                 Previous Myocardial Infarction and CAD; Risk                 Factors:Hypertension.  Sonographer:    Jyl Heinz Referring Phys: [place]  1. Left ventricular ejection fraction, by estimation, is <20%. The left ventricle has severely decreased function. The left ventricle demonstrates global hypokinesis. The left ventricular internal cavity size was severely dilated. There is mild left ventricular hypertrophy. Left ventricular diastolic parameters are indeterminate.  2. Right ventricular systolic function is severely reduced.  The right ventricular size is normal.  3. Left atrial size was mildly dilated.  4. Right atrial size was mild to moderately dilated.  5. The mitral valve is normal in structure. Mild mitral valve regurgitation.  6. Tricuspid valve regurgitation is mild to moderate.  7. The aortic valve is tricuspid. Aortic valve regurgitation is not visualized. Conclusion(s)/Recommendation(s): Compared to prior study 12/23/2019, LV and RV function have declined. FINDINGS  Left Ventricle: Left ventricular ejection fraction, by estimation, is <20%. The left ventricle has severely decreased function. The left ventricle demonstrates global hypokinesis. Definity contrast agent was given IV to delineate the left ventricular endocardial borders. The left ventricular internal cavity size was severely dilated. There is mild left ventricular hypertrophy. Left ventricular diastolic parameters are indeterminate. Right Ventricle: The right ventricular size is normal. Right vetricular wall thickness was not well visualized. Right ventricular systolic function is severely reduced. Left Atrium: Left atrial size was mildly dilated. Right Atrium: Right atrial size was mild to moderately dilated. Pericardium: There is no evidence of pericardial effusion. Mitral Valve: The mitral valve is normal in structure. Mild mitral valve regurgitation. Tricuspid Valve: The tricuspid valve is normal in structure. Tricuspid valve regurgitation is mild to moderate. Aortic Valve: The aortic valve is tricuspid. Aortic valve regurgitation is not visualized. Aortic valve peak gradient measures 6.0 mmHg. Pulmonic Valve: The  pulmonic valve was not well visualized. Pulmonic valve regurgitation is mild. Aorta: The aortic root and ascending aorta are structurally normal, with no evidence of dilitation. IAS/Shunts: The interatrial septum was not well visualized.  LEFT VENTRICLE PLAX 2D LVIDd:         6.80 cm      Diastology LVIDs:         6.20 cm      LV e' medial:    5.00  cm/s LV PW:         1.10 cm      LV E/e' medial:  16.9 LV IVS:        1.10 cm      LV e' lateral:   7.07 cm/s LVOT diam:     2.00 cm      LV E/e' lateral: 11.9 LV SV:         28 LV SV Index:   12 LVOT Area:     3.14 cm  LV Volumes (MOD) LV vol d, MOD A2C: 367.0 ml LV vol d, MOD A4C: 298.0 ml LV vol s, MOD A2C: 282.0 ml LV vol s, MOD A4C: 233.0 ml LV SV MOD A2C:     85.0 ml LV SV MOD A4C:     298.0 ml LV SV MOD BP:      70.2 ml RIGHT VENTRICLE            IVC RV Basal diam:  3.90 cm    IVC diam: 2.50 cm RV Mid diam:    3.40 cm RV S prime:     7.72 cm/s TAPSE (M-mode): 1.3 cm LEFT ATRIUM             Index        RIGHT ATRIUM           Index LA diam:        5.40 cm 2.23 cm/m   RA Area:     30.80 cm LA Vol (A2C):   91.8 ml 37.94 ml/m  RA Volume:   122.00 ml 50.42 ml/m LA Vol (A4C):   81.2 ml 33.56 ml/m LA Biplane Vol: 87.2 ml 36.03 ml/m  AORTIC VALVE AV Area (Vmax): 1.71 cm AV Vmax:        122.00 cm/s AV Peak Grad:   6.0 mmHg LVOT Vmax:      66.30 cm/s LVOT Vmean:     51.100 cm/s LVOT VTI:       0.089 m  AORTA Ao Root diam: 3.30 cm Ao Asc diam:  3.20 cm MITRAL VALVE               TRICUSPID VALVE MV Area (PHT): 6.65 cm    TR Peak grad:   31.8 mmHg MV Decel Time: 114 msec    TR Vmax:        282.00 cm/s MR Peak grad: 72.8 mmHg MR Mean grad: 51.0 mmHg    SHUNTS MR Vmax:      426.50 cm/s  Systemic VTI:  0.09 m MR Vmean:     350.0 cm/s   Systemic Diam: 2.00 cm MV E velocity: 84.40 cm/s Carolan Clines Electronically signed by Carolan Clines Signature Date/Time: 10/10/2021/3:33:21 PM    Final     Discharge Medications   Allergies as of 10/12/2021       Reactions   Penicillins Other (See Comments)   Childhood allergy Unknown reaction Did it involve swelling of the face/tongue/throat, SOB, or low BP? No Did it involve sudden or  severe rash/hives, skin peeling, or any reaction on the inside of your mouth or nose? No Did you need to seek medical attention at a hospital or doctor's office? No When did it last happen?       child If all above answers are "NO", may proceed with cephalosporin use.        Medication List     STOP taking these medications    aspirin 81 MG tablet Replaced by: aspirin EC 81 MG tablet   clopidogrel 75 MG tablet Commonly known as: PLAVIX   ibuprofen 200 MG tablet Commonly known as: ADVIL   sacubitril-valsartan 24-26 MG Commonly known as: ENTRESTO Replaced by: sacubitril-valsartan 49-51 MG       TAKE these medications    acetaminophen 500 MG tablet Commonly known as: TYLENOL Take 1,000 mg by mouth 2 (two) times daily as needed for moderate pain or headache.   aspirin EC 81 MG tablet Take 1 tablet (81 mg total) by mouth daily. Replaces: aspirin 81 MG tablet   atorvastatin 80 MG tablet Commonly known as: LIPITOR Take 1 tablet (80 mg total) by mouth daily at 6 PM. NEED OV.   carvedilol 3.125 MG tablet Commonly known as: COREG Take 1 tablet (3.125 mg total) by mouth 2 (two) times daily with a meal. What changed:  medication strength how much to take how to take this when to take this additional instructions   dapagliflozin propanediol 10 MG Tabs tablet Commonly known as: FARXIGA Take 1 tablet (10 mg total) by mouth daily.   ezetimibe 10 MG tablet Commonly known as: ZETIA Take 1 tablet (10 mg total) by mouth daily.   furosemide 20 MG tablet Commonly known as: LASIX Take 1 tablet (20 mg total) by mouth daily. Start taking on: October 13, 2021   nitroGLYCERIN 0.4 MG SL tablet Commonly known as: Nitrostat Place 1 tablet (0.4 mg total) under the tongue every 5 (five) minutes as needed for chest pain.   sacubitril-valsartan 49-51 MG Commonly known as: ENTRESTO Take 1 tablet by mouth 2 (two) times daily. Replaces: sacubitril-valsartan 24-26 MG   spironolactone 25 MG tablet Commonly known as: ALDACTONE Take 1 tablet (25 mg total) by mouth daily. What changed: how much to take        Disposition   The patient will be discharged in stable  condition to home.   Follow-up Information     [facility] Follow up.   Why: they will call you to schedule an apt for you, Contact information: [address] [place] [address] [phone]        [facility] Follow up.   Specialty: Cardiology Why: Hospital Follow Up for Heart Failure At The Advanced [facility] Failure Clinic at [facility] (Dr. Alford [place] Office). Entrance C Parking Garage Code [id number]  Appointment date and Time: 10/19/21 at 9:30 AM Contact information: [address] [id number] mc [place] [address] [phone]                  Duration of Discharge Encounter: Greater than 35 minutes   Signed, Knute Neu  10/12/2021, 9:09 AM

## 2021-10-12 NOTE — Progress Notes (Signed)
[name] to be D/C'd home per MD order. Discussed with the patient and all questions fully answered.  VVS, Skin clean, dry and intact without evidence of skin break down, no evidence of skin tears noted.  IV catheter discontinued intact. Site without signs and symptoms of complications. Dressing and pressure applied. Dressing to right radial clean and intact.  An After Visit Summary was printed and given to the patient.  Patient escorted via WC, and D/C home via private auto.  Jon Gills  10/12/2021 11:27 AM

## 2021-10-13 LAB — LIPOPROTEIN A (LPA): Lipoprotein (a): <8.4 nmol/L (ref <75.0)

## 2021-10-17 ENCOUNTER — Ambulatory Visit (HOSPITAL_COMMUNITY)
Admission: RE | Admit: 2021-10-17 | Discharge: 2021-10-17 | Disposition: A | Discharge status: Home / Self Care | Payer: Self-pay | Source: Ambulatory Visit | Attending: Family Medicine | Admitting: Family Medicine

## 2021-10-17 ENCOUNTER — Telehealth (HOSPITAL_COMMUNITY): Payer: Self-pay | Admitting: Pharmacy Technician

## 2021-10-17 ENCOUNTER — Other Ambulatory Visit (HOSPITAL_COMMUNITY): Payer: Self-pay

## 2021-10-17 ENCOUNTER — Encounter (HOSPITAL_COMMUNITY): Payer: Self-pay

## 2021-10-17 DIAGNOSIS — I2510 Atherosclerotic heart disease of native coronary artery without angina pectoris: Secondary | ICD-10-CM

## 2021-10-17 DIAGNOSIS — Z79899 Other long term (current) drug therapy: Secondary | ICD-10-CM | POA: Insufficient documentation

## 2021-10-17 DIAGNOSIS — I5022 Chronic systolic (congestive) heart failure: Secondary | ICD-10-CM

## 2021-10-17 DIAGNOSIS — I10 Essential (primary) hypertension: Secondary | ICD-10-CM

## 2021-10-17 DIAGNOSIS — Z955 Presence of coronary angioplasty implant and graft: Secondary | ICD-10-CM | POA: Insufficient documentation

## 2021-10-17 DIAGNOSIS — Z139 Encounter for screening, unspecified: Secondary | ICD-10-CM

## 2021-10-17 DIAGNOSIS — I110 Hypertensive heart disease with heart failure: Secondary | ICD-10-CM | POA: Insufficient documentation

## 2021-10-17 DIAGNOSIS — E785 Hyperlipidemia, unspecified: Secondary | ICD-10-CM

## 2021-10-17 DIAGNOSIS — K760 Fatty (change of) liver, not elsewhere classified: Secondary | ICD-10-CM

## 2021-10-17 DIAGNOSIS — Z8249 Family history of ischemic heart disease and other diseases of the circulatory system: Secondary | ICD-10-CM | POA: Insufficient documentation

## 2021-10-17 DIAGNOSIS — I255 Ischemic cardiomyopathy: Secondary | ICD-10-CM | POA: Insufficient documentation

## 2021-10-17 DIAGNOSIS — J45909 Unspecified asthma, uncomplicated: Secondary | ICD-10-CM | POA: Insufficient documentation

## 2021-10-17 DIAGNOSIS — Z597 Insufficient social insurance and welfare support: Secondary | ICD-10-CM | POA: Insufficient documentation

## 2021-10-17 DIAGNOSIS — I252 Old myocardial infarction: Secondary | ICD-10-CM | POA: Insufficient documentation

## 2021-10-17 DIAGNOSIS — I428 Other cardiomyopathies: Secondary | ICD-10-CM | POA: Insufficient documentation

## 2021-10-17 DIAGNOSIS — F17210 Nicotine dependence, cigarettes, uncomplicated: Secondary | ICD-10-CM | POA: Insufficient documentation

## 2021-10-17 DIAGNOSIS — Z7982 Long term (current) use of aspirin: Secondary | ICD-10-CM | POA: Insufficient documentation

## 2021-10-17 DIAGNOSIS — I493 Ventricular premature depolarization: Secondary | ICD-10-CM

## 2021-10-17 DIAGNOSIS — R002 Palpitations: Secondary | ICD-10-CM | POA: Insufficient documentation

## 2021-10-17 LAB — BASIC METABOLIC PANEL
Anion gap: 6 (ref 5–15)
BUN: 7 mg/dL (ref 6–20)
CO2: 25 mmol/L (ref 22–32)
Calcium: 8.9 mg/dL (ref 8.9–10.3)
Chloride: 109 mmol/L (ref 98–111)
Creatinine, Ser: 0.92 mg/dL (ref 0.61–1.24)
GFR, Estimated: >60 mL/min (ref >60)
Glucose, Bld: 105 mg/dL — ABNORMAL HIGH (ref 70–99)
Potassium: 4.3 mmol/L (ref 3.5–5.1)
Sodium: 140 mmol/L (ref 135–145)

## 2021-10-17 LAB — BRAIN NATRIURETIC PEPTIDE: B Natriuretic Peptide: 333.3 pg/mL — ABNORMAL HIGH (ref 0.0–100.0)

## 2021-10-17 LAB — MAGNESIUM: Magnesium: 2.3 mg/dL (ref 1.7–2.4)

## 2021-10-17 MED ORDER — CARVEDILOL 6.25 MG PO TABS
6.2500 mg | ORAL_TABLET | Freq: Two times a day (BID) | ORAL | 3 refills | Status: DC
  Filled 2021-10-17 – 2021-11-13 (×2): qty 60, 30d supply, fill #0

## 2021-10-17 MED ORDER — ATORVASTATIN CALCIUM 80 MG PO TABS
80.0000 mg | ORAL_TABLET | Freq: Every day | ORAL | 3 refills | Status: DC
  Filled 2021-10-17 – 2022-06-07 (×2): qty 90, 90d supply, fill #0

## 2021-10-17 MED ORDER — SPIRONOLACTONE 25 MG PO TABS
25.0000 mg | ORAL_TABLET | Freq: Every day | ORAL | 5 refills | Status: DC
  Filled 2021-10-17 – 2021-11-13 (×2): qty 30, 30d supply, fill #0
  Filled 2022-01-04: qty 30, 30d supply, fill #1
  Filled 2022-06-08: qty 30, 30d supply, fill #2
  Filled 2022-07-10: qty 30, 30d supply, fill #3

## 2021-10-17 MED ORDER — SACUBITRIL-VALSARTAN 49-51 MG PO TABS: 1.0000 | ORAL_TABLET | Freq: Two times a day (BID) | ORAL | 3 refills | Status: DC

## 2021-10-17 MED ORDER — EZETIMIBE 10 MG PO TABS
10.0000 mg | ORAL_TABLET | Freq: Every day | ORAL | 5 refills | Status: DC
  Filled 2021-10-17 – 2021-11-13 (×2): qty 30, 30d supply, fill #0
  Filled 2022-01-04: qty 30, 30d supply, fill #1
  Filled 2022-06-08: qty 30, 30d supply, fill #2
  Filled 2022-07-10 – 2022-08-02 (×3): qty 30, 30d supply, fill #3

## 2021-10-17 MED ORDER — FUROSEMIDE 20 MG PO TABS
20.0000 mg | ORAL_TABLET | Freq: Every day | ORAL | 5 refills | Status: DC
  Filled 2021-10-17 – 2021-11-13 (×2): qty 30, 30d supply, fill #0
  Filled 2022-01-12: qty 30, 30d supply, fill #1
  Filled 2022-05-23: qty 30, 30d supply, fill #2
  Filled 2022-07-10: qty 30, 30d supply, fill #3

## 2021-10-17 MED ORDER — DAPAGLIFLOZIN PROPANEDIOL 10 MG PO TABS
10.0000 mg | ORAL_TABLET | Freq: Every day | ORAL | 5 refills | Status: DC
  Filled 2021-10-17 – 2021-11-13 (×2): qty 30, 30d supply, fill #0
  Filled 2022-01-04 – 2022-01-15 (×3): qty 30, 30d supply, fill #1

## 2021-10-17 NOTE — Telephone Encounter (Addendum)
Advanced Heart Failure Patient Advocate Encounter  Patient is currently uninsured. Sent [employer] assistance application to [employer] via fax. Document scanned to chart. POI still needed.

## 2021-10-17 NOTE — Progress Notes (Signed)
ADVANCED HF CLINIC CONSULT NOTE   Primary Care: Patient, No Pcp Per HF Cardiologist: Dr. Shirlee Latch  HPI: Elex Mainwaring is a 41 y.o. male w/ h/o premature CAD s/p multiple MIs, initial inferior STEMI in 2011, HTN, HLD, tobacco use and poor compliance.    H/o inferior STEMI by ECG in 09/2009 with non-obstructive CAD by cath @ that time.    Admitted 11/2013 w/ CP and inferolateral STE c/w STEMI. Emergent cath showed severe RCA and OM1 disease, both treated w/ PCI + DES. Echo showed normal EF, 55-60%. Lost to f/u.    Admitted 07/2016 w/ ACS. Trop I peaked 0.70. Cath showed two vessel disease with patent stents in the RCA, and OM of the Lcx with normal LVEDP. Unable to identify the culprit for Troponin elevation. He was restarted on plavix post cath. Follow up echo showed drop in EF down to 35-40% with basal anterolateral hypokinesis, and mid to apical akinesis, and G2DD. GDMT adjusted but unfortunately lost to f/u again.    Readmitted 10/2018 for left sided chest pain. Admitted to poor med compliance. Ruled in for NSTEMI. High-sensitivity troponin peaked 9,501. Cath demonstrated the following   Multivessel CAD with mild luminal irregularity of the LAD and evidence for 60% narrowing in a very small first diagonal vessel.  The second diagonal vessel bifurcates in its mid segment and there appears to be total occlusion of the inferior limb after the bifurcation.  (This was not able to be depicted on the diagram); the stent in the circumflex vessel which extends into the OM-1 vessel was widely patent and there is ostial pinching of the AV groove circumflex with narrowing of 50 to 60%; the RCA has luminal irregularity with 30% proximal and proximal to mid stenoses.  The mid RCA stent and distal RCA stent is widely patent.   He was treated medically. Echo showed further reduction in LV systolic function w/ EF 20-25%, GIIDD, RV mildly reduced.    Repeat Echo 10/21 showed slightly improved EF, 30-35%, RV  normal.    Unfortunately, was lost to f/u again. Not seen by Cardiology since 2021. Had been off all meds x 1 year.    Admitted 7/23 with a/c CHF and epigastric abdominal pain. Labs notable for elevated TBili, at 2.9. LFTs normal. Lipase mildly elevated at 53. CT of A/P showed diffuse fatty infiltration of the liver + mild gallbladder wall edema, possibly related to liver disease. No gallstones. Pancrease unremarkable. Diuresed with IV lasix. Echo showed EF < 20%, RV severely reduced. LHC showed diffuse CAD but no severe stenosis to explain worsening of his EF. RHC mildly elevated PCWP w/ pulmonary venous hypertension, normal RA pressure and preserved CO. GDMT titrated. Discharged home, weight 253 lbs.  Today he returns for post hospital HF follow up. Overall feeling fine. Feels increased heat sensitivity and sweating a lot. Feels SOB with strong fragrances and feels asthma acting up. Notices occasional palpitations. Denies increasing SOB, CP, dizziness, edema, or PND/Orthopnea. Appetite ok. No fever or chills. Weight at home 256 pounds. Taking all medications. No tobacco use, no further ETOH use. Works full time at [employer]. He snores.   ECG (personally reviewed): ST 101 bpm with PVCs  Labs (7/23): K 4.0, creatinine 0.95, LDL 130, HDL, HDL 24  Cardiac Studies   - Echo (7/23): EF < 20%, severe LV dilation, RV severe dysfunction.    - R/LHC (7/23): diffuse CAD but no severe stenosis to explain worsening of his EF, mildly elevated PCWP w/  pulmonary venous hypertension, normal RA pressure and preserved CO.   RA mean 4 RV 36/3 PA 43/22, mean 31 PCWP mean 16 LV 136/23 AO 135/86  Oxygen saturations: PA 72% AO 93%  Cardiac Output (Fick) 7.57  Cardiac Index (Fick) 3.12 PVR 2 WU     1st Diag lesion is 60% stenosed.   Prox RCA-1 lesion is 55% stenosed.   Mid RCA lesion is 40% stenosed.   Prox RCA-2 lesion is 30% stenosed.   Mid LAD lesion is 40% stenosed.   Prox Cx to Dist Cx lesion is  50% stenosed.   Non-stenotic Prox Cx lesion was previously treated.   Non-stenotic Prox RCA to Mid RCA lesion was previously treated.   Non-stenotic Dist RCA lesion was previously treated.   Non-stenotic 1st Mrg lesion was previously treated    - Echo (10/21): EF 30-35%, moderately decreased LV function with global HK, normal RV   - LHC (8/20): Prox RCA-1 lesion is 30% stenosed. Prox RCA-2 lesion is 30% stenosed. Previously placed Prox RCA to Mid RCA stent (unknown type) is widely patent. Previously placed Dist RCA stent (unknown type) is widely patent. Previously placed 1st Mrg stent (unknown type) is widely patent. Previously placed Prox Cx stent (unknown type) is widely patent. Prox Cx to Mid Cx lesion is 55% stenosed. 1st Diag lesion is 60% stenosed.   Review of Systems: [y] = yes, [ ]  = no   General: Weight gain [ ] ; Weight loss [ ] ; Anorexia [ ] ; Fatigue [ ] ; Fever [ ] ; Chills [ ] ; Weakness [ ]   Cardiac: Chest pain/pressure [ ] ; Resting SOB [ ] ; Exertional SOB [ ] ; Orthopnea [ ] ; Pedal Edema [ ] ; Palpitations [ ] ; Syncope [ ] ; Presyncope [ ] ; Paroxysmal nocturnal dyspnea[ ]   Pulmonary: Cough [ ] ; Wheezing[ ] ; Hemoptysis[ ] ; Sputum [ ] ; Snoring ]  GI: Vomiting[ ] ; Dysphagia[ ] ; Melena[ ] ; Hematochezia [ ] ; Heartburn[ ] ; Abdominal pain [ ] ; Constipation [ ] ; Diarrhea [ ] ; BRBPR [ ]   GU: Hematuria[ ] ; Dysuria [ ] ; Nocturia[ ]   Vascular: Pain in legs with walking [ ] ; Pain in feet with lying flat [ ] ; Non-healing sores [ ] ; Stroke [ ] ; TIA [ ] ; Slurred speech [ ] ;  Neuro: Headaches[ ] ; Vertigo[ ] ; Seizures[ ] ; Paresthesias[ ] ;Blurred vision [ ] ; Diplopia [ ] ; Vision changes [ ]   Ortho/Skin: Arthritis [ ] ; Joint pain [ ] ; Muscle pain [ ] ; Joint swelling [ ] ; Back Pain [ ] ; Rash [ ]   Psych: Depression[ ] ; Anxiety[ ]   Heme: Bleeding problems [ ] ; Clotting disorders [ ] ; Anemia [ ]   Endocrine: Diabetes ]; Thyroid dysfunction[ ]   Past Medical History:  Diagnosis Date   CAD S/P  percutaneous coronary angioplasty 11/20/2013   a. 09/2009: EKG with Inf STEMI - no obstructive CAD;  b. 11/2013 Inflat STEMI/PCI: LM nl, LAD 20p, D1 sm - diff dzs, D2 large - nl, LCX 95-99, OM1 100 (3.5x38 Promus Premier DES), RCA 95-48m, 80d (3.0x20 and 3.0x16 Promus Premier DES') - normal EF;  c. NSTEMI 5/18 - patent stents, otw minimal CAD.- EF by Echo 35-40%; d. NSTEMI 10/2018 - ? culprit - Small branch of  D2,Med Rx,.  EF by Echo ~25%   Cardiomyopathy, ischemic 07/2016   h/o Inf STEMI 11/2013 - EF was "Normal"; b) NSTEMI 07/2016 (NO Cultprit on Cath) - Echo EF 35-40% (Basal Ant-Lat HK, basal-mid Inferolateral & apical Akinesis); c) NSTEMI 10/2018 -felt to be occluded small branch of D2, Echo  EF further decreased to 20-25%.    Essential hypertension    Hyperlipidemia with target LDL less than 100    Marijuana abuse    Migraine    Morbid obesity (HCC)    ST-segment elevation myocardial infarction (STEMI) of inferior wall (HCC) 11/20/2013   Current Outpatient Medications  Medication Sig Dispense Refill   acetaminophen (TYLENOL) 500 MG tablet Take 1,000 mg by mouth 2 (two) times daily as needed for moderate pain or headache.     aspirin EC 81 MG tablet Take 1 tablet (81 mg total) by mouth daily. 30 tablet 5   atorvastatin (LIPITOR) 80 MG tablet Take 1 tablet (80 mg total) by mouth daily at 6 PM. NEED OV. 90 tablet 3   carvedilol (COREG) 3.125 MG tablet Take 1 tablet (3.125 mg total) by mouth 2 (two) times daily with a meal. 60 tablet 5   dapagliflozin propanediol (FARXIGA) 10 MG TABS tablet Take 1 tablet (10 mg total) by mouth daily. 30 tablet 5   ezetimibe (ZETIA) 10 MG tablet Take 1 tablet (10 mg total) by mouth daily. 30 tablet 5   furosemide (LASIX) 20 MG tablet Take 1 tablet (20 mg total) by mouth daily. 30 tablet 5   nitroGLYCERIN (NITROSTAT) 0.4 MG SL tablet Place 1 tablet (0.4 mg total) under the tongue every 5 (five) minutes as needed for chest pain. 25 tablet 2   sacubitril-valsartan  (ENTRESTO) 49-51 MG Take 1 tablet by mouth 2 (two) times daily. 180 tablet 3   spironolactone (ALDACTONE) 25 MG tablet Take 1 tablet (25 mg total) by mouth daily. 30 tablet 5   No current facility-administered medications for this encounter.   Allergies  Allergen Reactions   Penicillins Other (See Comments)    Childhood allergy Unknown reaction  Did it involve swelling of the face/tongue/throat, SOB, or low BP? No Did it involve sudden or severe rash/hives, skin peeling, or any reaction on the inside of your mouth or nose? No Did you need to seek medical attention at a hospital or doctor's office? No When did it last happen?      child If all above answers are "NO", may proceed with cephalosporin use.   Social History   Socioeconomic History   Marital status: Married    Spouse name: Not on file   Number of children: 2   Years of education: Not on file   Highest education level: Not on file  Occupational History    Employer: [employer]    Comment: [place]  Tobacco Use   Smoking status: Former    Types: Cigarettes    Quit date: 09/2016    Years since quitting: 5.0   Smokeless tobacco: Never  Vaping Use   Vaping Use: Every day  Substance and Sexual Activity   Alcohol use: No    Alcohol/week: 0.0 standard drinks of alcohol   Drug use: No   Sexual activity: Yes  Other Topics Concern   Not on file  Social History Narrative   Lives in [place] with wife.  Works with mentally challenged adults.   Social Determinants of Health   Financial Resource Strain: Not on file  Food Insecurity: Not on file  Transportation Needs: Not on file  Physical Activity: Not on file  Stress: Not on file  Social Connections: Not on file  Intimate Partner Violence: Not on file   Family History  Problem Relation Age of Onset   Hypertension Mother    Heart attack Father 35  2 MIs by age 50 (first at 44)-- CABG   Hypertension Father    Heart failure Father     Hyperlipidemia Father    Heart attack Maternal Grandfather        65's   Heart attack Paternal Grandfather        26's   Heart attack Other    Father w/ premature CAD, first MI at age 45. Paternal grandfather, maternal grandfather and uncle all w/ MIs in their early 74s. No known family history of CHF.   BP 112/78   Pulse 82   Wt 116.5 kg (256 lb 12.8 oz)   SpO2 98%   BMI 32.97 kg/m   Wt Readings from Last 3 Encounters:  10/17/21 116.5 kg (256 lb 12.8 oz)  10/12/21 115.2 kg (253 lb 14.4 oz)  10/05/20 123.4 kg (272 lb)   PHYSICAL EXAM: General:  NAD. No resp difficulty HEENT: Normal Neck: Supple. No JVD. Carotids 2+ bilat; no bruits. No lymphadenopathy or thryomegaly appreciated. Cor: PMI nondisplaced. Irregular rate & rhythm. No rubs, gallops or murmurs. Lungs: Clear Abdomen: Soft, nontender, nondistended. No hepatosplenomegaly. No bruits or masses. Good bowel sounds. Extremities: No cyanosis, clubbing, rash, edema Neuro: Alert & oriented x 3, cranial nerves grossly intact. Moves all 4 extremities w/o difficulty. Affect pleasant.  ASSESSMENT & PLAN: 1. Chronic systolic CHF: Ischemic cardiomyopathy.  ? Component of NICM d/t HTN +/- PVCs. Last echo in 10/21, EF 30-35%.  EF as low as 20-25% in 8/20.  Echo 7/23 EF < 20%, RV severely reduced. L/RHC this admit (7/23) demonstrated diffuse CAD but no severe stenosis to explain worsening of his EF. RHC mildly elevated PCWP w/ pulmonary venous hypertension, normal RA pressure and preserved CO. Stable NYHA II, he is not volume overloaded on exam. - Increase Coreg to 6.25 mg bid. - Continue Lasix 20 mg daily. - Continue Entresto 49-51 mg bid. BMET and BNP today. - Continue spiro 25 mg daily. - Continue Farxiga 10 mg daily, A1c 5.9% - Discussed importance of adherence with medical therapy.  - Will likely need ICD, will repeat echo in 2-3 months for ICD.  Narrow QRS so not CRT candidate.  2. CAD: Strong family history of premature CAD.  H/o  inferolateral STEMI in 9/15 with DES to LCx into OM1 and DES to RCA.  LHC this admit 7/23 demonstrated diffuse CAD but no severe stenosis to explain worsening of his EF. No chest pain. - Continue ASA 81 mg daily. - Continue atorvastatin 80 mg daily + Zetia 10 mg daily. - He has been referred to Cardiac Rehab, he is agreeable to start. 3. PVCs: Frequent on ECG today. - Needs sleep study when he has insurance (will enroll through employer in the fall). - Increase Coreg. Check Mag today. 3. HTN: Controlled. - Continue GDMT as above. 4. Hyperlipidemia: LDL 130, off statin. Goal LDL < 55. - Continue statin + Zetia. 5. Fatty liver disease: Noted on CT abdomen.  6. SDOH: Uninsured. Missed enrollment date for insurance through his employer. HFSW helping. - Patient assistance applications for Entresto started. HF medications via HF fund, including [place].   Follow up in 4 weeks with PharmD (increase Entresto) and 12 weeks with Dr. Shirlee Latch + echo.  Prince Rome, FNP-BC 10/17/21

## 2021-10-17 NOTE — Patient Instructions (Addendum)
INCREASE Coreg to 6.25 mg one tab twice a day  Labs today We will only contact you if something comes back abnormal or we need to make some changes. Otherwise no news is good news!  Please see the attached list of primary care providers to establish care.  Your physician recommends that you schedule a follow-up appointment in: 4 weeks with the pharmacy team and in 12 weeks with Dr Shirlee Latch and echo  Your physician has requested that you have an echocardiogram. Echocardiography is a painless test that uses sound waves to create images of your heart. It provides your doctor with information about the size and shape of your heart and how well your heart's chambers and valves are working. This procedure takes approximately one hour. There are no restrictions for this procedure.   Do the following things EVERYDAY: Weigh yourself in the morning before breakfast. Write it down and keep it in a log. Take your medicines as prescribed Eat low salt foods--Limit salt (sodium) to 2000 mg per day.  Stay as active as you can everyday Limit all fluids for the day to less than 2 liters  At [facility], you and your health needs are our priority. As part of our continuing mission to provide you with exceptional heart care, we have created designated Provider Care Teams. These Care Teams include your primary Cardiologist (physician) and Advanced Practice Providers (APPs- Physician Assistants and Nurse Practitioners) who all work together to provide you with the care you need, when you need it.   You may see any of the following providers on your designated Care Team at your next follow up: Dr Arvilla Meres Dr Carron Curie, NP Robbie Lis, [place] [facility] [place], [place] Karle Plumber, PharmD   Please be sure to bring in all your medications bottles to every appointment.

## 2021-10-19 ENCOUNTER — Ambulatory Visit: Payer: Self-pay | Attending: Internal Medicine | Admitting: Internal Medicine

## 2021-10-19 ENCOUNTER — Encounter (HOSPITAL_COMMUNITY): Payer: Self-pay

## 2021-10-19 ENCOUNTER — Encounter: Payer: Self-pay | Admitting: Internal Medicine

## 2021-10-19 VITALS — BP 117/83 | HR 95 | Temp 99.0°F | Ht 74.0 in | Wt 254.8 lb

## 2021-10-19 DIAGNOSIS — I2510 Atherosclerotic heart disease of native coronary artery without angina pectoris: Secondary | ICD-10-CM

## 2021-10-19 DIAGNOSIS — I5022 Chronic systolic (congestive) heart failure: Secondary | ICD-10-CM

## 2021-10-19 DIAGNOSIS — R0683 Snoring: Secondary | ICD-10-CM

## 2021-10-19 DIAGNOSIS — K760 Fatty (change of) liver, not elsewhere classified: Secondary | ICD-10-CM

## 2021-10-19 DIAGNOSIS — Z7689 Persons encountering health services in other specified circumstances: Secondary | ICD-10-CM

## 2021-10-19 DIAGNOSIS — R7303 Prediabetes: Secondary | ICD-10-CM

## 2021-10-19 NOTE — Progress Notes (Signed)
No concerns. 

## 2021-10-19 NOTE — Progress Notes (Signed)
Patient ID: Reginald Fernandez, male    DOB: Oct 15, 1980  MRN: [id number]  CC: Hospitalization Follow-up   Subjective: Reginald Fernandez is a 41 y.o. male who presents for new hospital follow-up. Wife Reginald Fernandez is with him. 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  Patient was admitted 7/24-27/2023 with decompensated CHF.  Found to have worsening of his ejection fraction from 30-35% on echo 12/2019 to now <20% with reduce RV function as well.  He was diuresed.  R/LHC demonstrated diffuse CAD but no severe stenosis to explain worsening of his CHF per dischg summary.  Saw cardiologist nurse practitioner 10/17/2021.  Patient was continued on guideline directed therapy.  Plan for cardiac rehab and repeat echo in 3 months.  If no improvement may need ICD.   Pt reports he was not aware that his heart function had declined from previous echo 12/2019.  He was under the impression that his heart function was stable. Reports compliance with medications listed including furosemide, spironolactone, carvedilol, Marcelline Deist and Entresto.  He is also taking the Zetia and atorvastatin. He has not had any chest pains or shortness of breath recently.  No recent use of nitroglycerin.  Patient with prediabetes based on A1c of 5.9.  Patient reports he was not aware of this either.  Incidental finding on CT of abdomen and pelvis with fatty liver changes.  He reports being vegetarian, does not eat meat.  Drinks mainly water.  Gets in fruits and vegetables during the day.  Mention made on chart of loud snoring.  Patient and wife endorse that he snores very loud.  However patient states he wakes up feeling refreshed in the mornings.  Denies morning headaches or daytime sleepiness.  Plan was to do sleep study but patient states he plans to  hold off on doing so until he gets insurance this fall.  Patient Active Problem List   Diagnosis Date Noted   CHF (congestive heart failure) (HCC) 10/09/2021   Educated about COVID-19 virus infection 12/06/2019   Prediabetes 10/25/2018   Dilated cardiomyopathy (HCC) 08/01/2016   Chronic combined systolic and diastolic heart failure (HCC) 08/01/2016   NSTEMI (non-ST elevated myocardial infarction) (HCC) 07/31/2016   Snoring 02/22/2014   Daytime somnolence 02/22/2014   Snores 11/23/2013   Marijuana abuse    Morbid obesity (HCC)    History of acute inferior wall MI 11/20/2013   ST elevation myocardial infarction (STEMI) of true posterior wall, subsequent episode of care (HCC) 11/20/2013   CAD S/P percutaneous coronary angioplasty: DES PCI -Cx-OM1, RCA x 2 11/20/2013    Class: Status post   Essential hypertension    Hyperlipidemia LDL goal <70    HYPERGLYCEMIA 10/25/2009   ASTHMA 10/24/2009     Current Outpatient Medications on File Prior to Visit  Medication Sig Dispense Refill   acetaminophen (TYLENOL) 500 MG tablet Take 1,000 mg by mouth 2 (two) times daily as needed for moderate pain or headache.     aspirin EC 81 MG tablet Take 1 tablet (81 mg total) by mouth daily. 30 tablet 5   atorvastatin (LIPITOR) 80 MG tablet Take 1 tablet (80 mg total) by mouth daily at 6 PM 90 tablet 3   carvedilol (COREG) 6.25 MG tablet Take 1 tablet (6.25 mg total) by mouth 2 (two) times daily with a  meal. 60 tablet 3   dapagliflozin propanediol (FARXIGA) 10 MG TABS tablet Take 1 tablet (10 mg total) by mouth daily. 30 tablet 5   ezetimibe (ZETIA) 10 MG tablet Take 1 tablet (10 mg total) by mouth daily. 30 tablet 5   furosemide (LASIX) 20 MG tablet Take 1 tablet (20 mg total) by mouth daily. 30 tablet 5   nitroGLYCERIN (NITROSTAT) 0.4 MG SL tablet Place 1 tablet (0.4 mg total) under the tongue every 5 (five) minutes as needed for chest pain. 25 tablet 2   sacubitril-valsartan (ENTRESTO) 49-51 MG Take 1  tablet by mouth 2 (two) times daily. 180 tablet 3   spironolactone (ALDACTONE) 25 MG tablet Take 1 tablet (25 mg total) by mouth daily. 30 tablet 5   No current facility-administered medications on file prior to visit.    Allergies  Allergen Reactions   Penicillins Other (See Comments)    Childhood allergy Unknown reaction  Did it involve swelling of the face/tongue/throat, SOB, or low BP? No Did it involve sudden or severe rash/hives, skin peeling, or any reaction on the inside of your mouth or nose? No Did you need to seek medical attention at a hospital or doctor's office? No When did it last happen?      child If all above answers are "NO", may proceed with cephalosporin use.    Social History   Socioeconomic History   Marital status: Married    Spouse name: Not on file   Number of children: 2   Years of education: Not on file   Highest education level: Not on file  Occupational History    Employer: [employer]    Comment: [place]  Tobacco Use   Smoking status: Former    Types: Cigarettes    Quit date: 09/2016    Years since quitting: 5.0   Smokeless tobacco: Never  Vaping Use   Vaping Use: Every day  Substance and Sexual Activity   Alcohol use: No    Alcohol/week: 0.0 standard drinks of alcohol   Drug use: No   Sexual activity: Yes  Other Topics Concern   Not on file  Social History Narrative   Lives in [place] with wife.  Works with mentally challenged adults.   Social Determinants of Health   Financial Resource Strain: Not on file  Food Insecurity: Not on file  Transportation Needs: Not on file  Physical Activity: Not on file  Stress: Not on file  Social Connections: Not on file  Intimate Partner Violence: Not on file    Family History  Problem Relation Age of Onset   Hypertension Mother    Heart attack Father 49       2 MIs by age 10 (first at 53)-- CABG   Hypertension Father    Heart failure Father    Hyperlipidemia  Father    Heart attack Maternal Grandfather        60's   Heart attack Paternal Grandfather        23's   Heart attack Other     Past Surgical History:  Procedure Laterality Date   LEFT HEART CATH AND CORONARY ANGIOGRAPHY N/A 08/01/2016   Procedure: Left Heart Cath and Coronary Angiography;  Surgeon: Tonny Bollman, MD;  Location: [facility] INVASIVE CV LAB;;  Stable 2 V CAD - patent RCA & OM stents. Diffuse, non-obstructive CAD elsewhere. Moderately elevated LVEDP --> No culprit lesion  => restarted Plavix & titrate Med Rx.   LEFT HEART CATH AND  CORONARY ANGIOGRAPHY N/A 10/24/2018   Procedure: LEFT HEART CATH AND CORONARY ANGIOGRAPHY;  Surgeon: Lennette Bihari, MD;  Location: [facility] INVASIVE CV LAB;  60% small D1, D2 has 3 branches 1 of which is small and appears to be 100%, LCx-OM widely patent with mild 55% stenosis in the jailed AV groove LCx. RCA has mild luminal irregularities 30% proximal with widely patent stents in the mid and distal RCA. (Culprit 100% small branch of D2)   LEFT HEART CATH AND CORONARY ANGIOGRAPHY  09/2009   (Presumably in the setting of STEMI) mild RCA luminal Irregularities - MIld INf HK -- Med Rx; b)   LEFT HEART CATHETERIZATION WITH CORONARY ANGIOGRAM N/A 11/20/2013   Procedure: LEFT HEART CATHETERIZATION WITH CORONARY ANGIOGRAM;  Surgeon: Marykay Lex, MD;  Location: [facility] CATH LAB;  Service: Cardiovascular;   Inf STEMI: OM1 100%, m-dRCA 95-99% thrombotic mRCA with tandem 80% -- PCI, LAD & Cx relatively normal.   PERCUTANEOUS CORONARY STENT INTERVENTION (PCI-S)  11/20/2013   a) PCI - OM1 Promus Premier DES 3.5 mm x 38 mm, m-d RCA Promus Premier DES  3.0 mm x 20 mm & 3.0 mm x 16 mm   RIGHT/LEFT HEART CATH AND CORONARY ANGIOGRAPHY N/A 10/11/2021   Procedure: RIGHT/LEFT HEART CATH AND CORONARY ANGIOGRAPHY;  Surgeon: Laurey Morale, MD;  Location: [facility] INVASIVE CV LAB;  Service: Cardiovascular;  Laterality: N/A;   TRANSTHORACIC ECHOCARDIOGRAM  11/20/2013   LV EF 55-60%. No regional  wall motion abnormality.  Normal valves    TRANSTHORACIC ECHOCARDIOGRAM  08/11/2016   (Non-STEMI-no culprit lesion): Moderately dilated LV.  Basal anterolateral HK with basal-mid inferolateral akinesis.  Mild-apical inferior akinesis.  EF 35 to 40%.  GRII DD.  Mild biatrial dilation.  Normal RV size and function. Mostly normal valves.   TRANSTHORACIC ECHOCARDIOGRAM  10/24/2018   (Non-STEMI-culprit lesion was 1 of 3 small 2 branches.)  EF 20-25%.  Mild mildly dilated LV.  GR 2 DD.  Mild biatrial dilation.  Normal PA pressures.    ROS: Review of Systems Negative except as stated above  PHYSICAL EXAM: BP 117/83   Pulse 95   Temp 99 F (37.2 C) (Oral)   Ht 6\' 2"  (1.88 m)   Wt 254 lb 12.8 oz (115.6 kg)   SpO2 99%   BMI 32.71 kg/m   Physical Exam  General appearance - alert, well appearing, middle-age African-American male and in no distress Mental status - normal mood, behavior, speech, dress, motor activity, and thought processes Neck - supple, no significant adenopathy Chest - clear to auscultation, no wheezes, rales or rhonchi, symmetric air entry Heart - normal rate, regular rhythm, normal S1, S2, no murmurs, rubs, clicks or gallops Extremities - peripheral pulses normal, no pedal edema, no clubbing or cyanosis      Latest Ref Rng & Units 10/17/2021    3:40 PM 10/12/2021    1:37 AM 10/11/2021   11:38 AM  CMP  Glucose 70 - 99 mg/dL 10/13/2021  161    BUN 6 - 20 mg/dL 7  9    Creatinine 096 - 1.24 mg/dL 0.45  4.09    Sodium 8.11 - 145 mmol/L 140  137  141    141   Potassium 3.5 - 5.1 mmol/L 4.3  4.0  3.7    3.6   Chloride 98 - 111 mmol/L 109  103    CO2 22 - 32 mmol/L 25  24    Calcium 8.9 - 10.3 mg/dL 8.9  8.6  Lipid Panel     Component Value Date/Time   CHOL 175 10/11/2021 0125   TRIG 106 10/11/2021 0125   HDL 24 (L) 10/11/2021 0125   CHOLHDL 7.3 10/11/2021 0125   VLDL 21 10/11/2021 0125   LDLCALC 130 (H) 10/11/2021 0125    CBC    Component Value Date/Time   WBC  12.0 (H) 10/12/2021 0137   RBC 5.55 10/12/2021 0137   HGB 16.6 10/12/2021 0137   HCT 48.8 10/12/2021 0137   PLT 260 10/12/2021 0137   MCV 87.9 10/12/2021 0137   MCH 29.9 10/12/2021 0137   MCHC 34.0 10/12/2021 0137   RDW 13.5 10/12/2021 0137   LYMPHSABS 2.2 07/31/2016 1218   MONOABS 0.8 07/31/2016 1218   EOSABS 0.2 07/31/2016 1218   BASOSABS 0.0 07/31/2016 1218    ASSESSMENT AND PLAN: 1. Establishing care with new doctor, encounter for   2. Chronic systolic heart failure [facility]) Encourage patient to talk with his cardiologist as he was very concerned about the reduced heart function. Encouraged him to take his medicines as prescribed to see whether the heart function would have improved on repeat echo was planned in 3 months.  3. CAD native artery without angina pectoris -Continue atorvastatin, aspirin, and carvedilol. Advised to keep sublingual nitroglycerin on him at all times to use as needed.  3. Prediabetes 4. Fatty liver Discussed the importance of healthy eating habits and regular exercise to help prevent progression to full diabetes.  However advised that he hold off on exercise until he has completed cardiac rehab so that he would get a better idea of his limitations.  5. Loud snoring Besides the loud snoring, rest of his history not very suggestive of sleep apnea.    Patient was given the opportunity to ask questions.  Patient verbalized understanding of the plan and was able to repeat key elements of the plan.   This documentation was completed using Paediatric nurse.  Any transcriptional errors are unintentional.  No orders of the defined types were placed in this encounter.    Requested Prescriptions    No prescriptions requested or ordered in this encounter    No follow-ups on file.  Jonah Blue, MD, FACP

## 2021-10-19 NOTE — Patient Instructions (Addendum)
You can get the forms for the [facility] at the front desk on your way out today.  Prediabetes Eating Plan Prediabetes is a condition that causes blood sugar (glucose) levels to be higher than normal. This increases the risk for developing type 2 diabetes (type 2 diabetes mellitus). Working with a health care provider or nutrition specialist (dietitian) to make diet and lifestyle changes can help prevent the onset of diabetes. These changes may help you: Control your blood glucose levels. Improve your cholesterol levels. Manage your blood pressure. What are tips for following this plan? Reading food labels Read food labels to check the amount of fat, salt (sodium), and sugar in prepackaged foods. Avoid foods that have: Saturated fats. Trans fats. Added sugars. Avoid foods that have more than 300 milligrams (mg) of sodium per serving. Limit your sodium intake to less than 2,300 mg each day. Shopping Avoid buying pre-made and processed foods. Avoid buying drinks with added sugar. Cooking Cook with olive oil. Do not use butter, lard, or ghee. Bake, broil, grill, steam, or boil foods. Avoid frying. Meal planning  Work with your dietitian to create an eating plan that is right for you. This may include tracking how many calories you take in each day. Use a food diary, notebook, or mobile application to track what you eat at each meal. Consider following a Mediterranean diet. This includes: Eating several servings of fresh fruits and vegetables each day. Eating fish at least twice a week. Eating one serving each day of whole grains, beans, nuts, and seeds. Using olive oil instead of other fats. Limiting alcohol. Limiting red meat. Using nonfat or low-fat dairy products. Consider following a plant-based diet. This includes dietary choices that focus on eating mostly vegetables and fruit, grains, beans, nuts, and seeds. If you have high blood pressure, you may need to limit your sodium  intake or follow a diet such as the DASH (Dietary Approaches to Stop Hypertension) eating plan. The DASH diet aims to lower high blood pressure. Lifestyle Set weight loss goals with help from your health care team. It is recommended that most people with prediabetes lose 7% of their body weight. Exercise for at least 30 minutes 5 or more days a week. Attend a support group or seek support from a mental health counselor. Take over-the-counter and prescription medicines only as told by your health care provider. What foods are recommended? Fruits Berries. Bananas. Apples. Oranges. Grapes. Papaya. Mango. Pomegranate. Kiwi. Grapefruit. Cherries. Vegetables Lettuce. Spinach. Peas. Beets. Cauliflower. Cabbage. Broccoli. Carrots. Tomatoes. Squash. Eggplant. Herbs. Peppers. Onions. Cucumbers. Brussels sprouts. Grains Whole grains, such as whole-wheat or whole-grain breads, crackers, cereals, and pasta. Unsweetened oatmeal. Bulgur. Barley. Quinoa. Brown rice. Corn or whole-wheat flour tortillas or taco shells. Meats and other proteins Seafood. Poultry without skin. Lean cuts of pork and beef. Tofu. Eggs. Nuts. Beans. Dairy Low-fat or fat-free dairy products, such as yogurt, cottage cheese, and cheese. Beverages Water. Tea. Coffee. Sugar-free or diet soda. Seltzer water. Low-fat or nonfat milk. Milk alternatives, such as soy or almond milk. Fats and oils Olive oil. Canola oil. Sunflower oil. Grapeseed oil. Avocado. Walnuts. Sweets and desserts Sugar-free or low-fat pudding. Sugar-free or low-fat ice cream and other frozen treats. Seasonings and condiments Herbs. Sodium-free spices. Mustard. Relish. Low-salt, low-sugar ketchup. Low-salt, low-sugar barbecue sauce. Low-fat or fat-free mayonnaise. The items listed above may not be a complete list of recommended foods and beverages. Contact a dietitian for more information. What foods are not recommended? Fruits Fruits canned  with  syrup. Vegetables Canned vegetables. Frozen vegetables with butter or cream sauce. Grains Refined white flour and flour products, such as bread, pasta, snack foods, and cereals. Meats and other proteins Fatty cuts of meat. Poultry with skin. Breaded or fried meat. Processed meats. Dairy Full-fat yogurt, cheese, or milk. Beverages Sweetened drinks, such as iced tea and soda. Fats and oils Butter. Lard. Ghee. Sweets and desserts Baked goods, such as cake, cupcakes, pastries, cookies, and cheesecake. Seasonings and condiments Spice mixes with added salt. Ketchup. Barbecue sauce. Mayonnaise. The items listed above may not be a complete list of foods and beverages that are not recommended. Contact a dietitian for more information. Where to find more information American Diabetes Association: www.diabetes.org Summary You may need to make diet and lifestyle changes to help prevent the onset of diabetes. These changes can help you control blood sugar, improve cholesterol levels, and manage blood pressure. Set weight loss goals with help from your health care team. It is recommended that most people with prediabetes lose 7% of their body weight. Consider following a Mediterranean diet. This includes eating plenty of fresh fruits and vegetables, whole grains, beans, nuts, seeds, fish, and low-fat dairy, and using olive oil instead of other fats. This information is not intended to replace advice given to you by your health care provider. Make sure you discuss any questions you have with your health care provider. Document Revised: 06/04/2019 Document Reviewed: 06/04/2019 Elsevier Patient Education  2023 [employer].

## 2021-10-25 ENCOUNTER — Other Ambulatory Visit (HOSPITAL_COMMUNITY): Payer: Self-pay

## 2021-11-13 ENCOUNTER — Other Ambulatory Visit (HOSPITAL_COMMUNITY): Payer: Self-pay

## 2021-11-14 ENCOUNTER — Ambulatory Visit (HOSPITAL_COMMUNITY)
Admission: RE | Admit: 2021-11-14 | Discharge: 2021-11-14 | Disposition: A | Discharge status: Home / Self Care | Payer: Self-pay | Source: Ambulatory Visit | Attending: Cardiology | Admitting: Cardiology

## 2021-11-14 ENCOUNTER — Telehealth (HOSPITAL_COMMUNITY): Payer: Self-pay | Admitting: Pharmacist

## 2021-11-14 ENCOUNTER — Other Ambulatory Visit (HOSPITAL_COMMUNITY): Payer: Self-pay

## 2021-11-14 VITALS — BP 110/76 | HR 91 | Wt 262.8 lb

## 2021-11-14 DIAGNOSIS — E785 Hyperlipidemia, unspecified: Secondary | ICD-10-CM | POA: Insufficient documentation

## 2021-11-14 DIAGNOSIS — Z7982 Long term (current) use of aspirin: Secondary | ICD-10-CM | POA: Insufficient documentation

## 2021-11-14 DIAGNOSIS — Z79899 Other long term (current) drug therapy: Secondary | ICD-10-CM | POA: Insufficient documentation

## 2021-11-14 DIAGNOSIS — I5042 Chronic combined systolic (congestive) and diastolic (congestive) heart failure: Secondary | ICD-10-CM

## 2021-11-14 DIAGNOSIS — Z7902 Long term (current) use of antithrombotics/antiplatelets: Secondary | ICD-10-CM | POA: Insufficient documentation

## 2021-11-14 DIAGNOSIS — Z87891 Personal history of nicotine dependence: Secondary | ICD-10-CM | POA: Insufficient documentation

## 2021-11-14 DIAGNOSIS — I110 Hypertensive heart disease with heart failure: Secondary | ICD-10-CM | POA: Insufficient documentation

## 2021-11-14 DIAGNOSIS — I5022 Chronic systolic (congestive) heart failure: Secondary | ICD-10-CM | POA: Insufficient documentation

## 2021-11-14 DIAGNOSIS — Z91148 Patient's other noncompliance with medication regimen for other reason: Secondary | ICD-10-CM | POA: Insufficient documentation

## 2021-11-14 DIAGNOSIS — I252 Old myocardial infarction: Secondary | ICD-10-CM | POA: Insufficient documentation

## 2021-11-14 DIAGNOSIS — K760 Fatty (change of) liver, not elsewhere classified: Secondary | ICD-10-CM | POA: Insufficient documentation

## 2021-11-14 DIAGNOSIS — Z7984 Long term (current) use of oral hypoglycemic drugs: Secondary | ICD-10-CM | POA: Insufficient documentation

## 2021-11-14 DIAGNOSIS — I255 Ischemic cardiomyopathy: Secondary | ICD-10-CM | POA: Insufficient documentation

## 2021-11-14 MED ORDER — CARVEDILOL 6.25 MG PO TABS
9.3750 mg | ORAL_TABLET | Freq: Two times a day (BID) | ORAL | 5 refills | Status: DC
  Filled 2021-11-14 – 2022-01-04 (×2): qty 90, 30d supply, fill #0
  Filled 2022-06-08: qty 90, 30d supply, fill #1

## 2021-11-14 NOTE — Patient Instructions (Signed)
It was a pleasure seeing you today!  MEDICATIONS: -We are changing your medications today -Start carvedilol 9.375 mg (1.5 tablets) twice daily -Call if you have questions about your medications.  LABS: -We will call you if your labs need attention.  NEXT APPOINTMENT: Return to clinic on 12/05/2021 with pharmacy clinic.  In general, to take care of your heart failure: -Limit your fluid intake to 2 Liters (half-gallon) per day.   -Limit your salt intake to ideally 2-3 grams (2000-3000 mg) per day. -Weigh yourself daily and record, and bring that "weight diary" to your next appointment.  (Weight gain of 2-3 pounds in 1 day typically means fluid weight.) -The medications for your heart are to help your heart and help you live longer.   -Please contact us before stopping any of your heart medications.  Call the clinic at [phone] with questions or to reschedule future appointments.

## 2021-11-14 NOTE — Progress Notes (Signed)
Advanced Heart Failure Clinic Note   Primary Care: Patient, No Pcp Per HF Cardiologist: Dr. Shirlee Latch  HPI:  Reginald Fernandez is a 41 y.o. male w/ h/o premature CAD s/p multiple MIs, initial inferior STEMI in 2011, HTN, HLD, tobacco use and poor compliance.    H/o inferior STEMI by ECG in 09/2009 with non-obstructive CAD by cath @ that time.    Admitted 11/2013 w/ CP and inferolateral STE c/w STEMI. Emergent cath showed severe RCA and OM1 disease, both treated w/ PCI + DES. Echo showed normal EF, 55-60%. Lost to f/u.    Admitted 07/2016 w/ ACS. Trop I peaked 0.70. Cath showed two vessel disease with patent stents in the RCA, and OM of the Lcx with normal LVEDP. Unable to identify the culprit for Troponin elevation. He was restarted on Plavix post cath. Follow up echo showed drop in EF down to 35-40% with basal anterolateral hypokinesis, and mid to apical akinesis, and G2DD. GDMT adjusted but unfortunately lost to f/u again.    Readmitted 10/2018 for left sided chest pain. Admitted to poor med compliance. Ruled in for NSTEMI. High-sensitivity troponin peaked 9,501. Cath demonstrated the following   Multivessel CAD with mild luminal irregularity of the LAD and evidence for 60% narrowing in a very small first diagonal vessel.  The second diagonal vessel bifurcates in its mid segment and there appears to be total occlusion of the inferior limb after the bifurcation.  (This was not able to be depicted on the diagram); the stent in the circumflex vessel which extends into the OM-1 vessel was widely patent and there is ostial pinching of the AV groove circumflex with narrowing of 50 to 60%; the RCA has luminal irregularity with 30% proximal and proximal to mid stenoses.  The mid RCA stent and distal RCA stent is widely patent.   He was treated medically. Echo showed further reduction in LV systolic function w/ EF 20-25%, GIIDD, RV mildly reduced.    Repeat Echo 12/2019 showed slightly improved EF, 30-35%,  RV normal.    Unfortunately, was lost to f/u again. Not seen by Cardiology since 2021. Had been off all meds x 1 year.    Admitted 09/2021 with a/c CHF and epigastric abdominal pain. Labs notable for elevated TBili, at 2.9. LFTs normal. Lipase mildly elevated at 53. CT of A/P showed diffuse fatty infiltration of the liver + mild gallbladder wall edema, possibly related to liver disease. No gallstones. Pancrease unremarkable. Diuresed with IV Lasix. Echo showed EF < 20%, RV severely reduced. LHC showed diffuse CAD but no severe stenosis to explain worsening of his EF. RHC mildly elevated PCWP w/ pulmonary venous hypertension, normal RA pressure and preserved CO. GDMT titrated. Discharged home, weight 253 lbs.   Returned for post hospital HF follow up on 10/17/2021. Overall was feeling fine. Felt increased heat sensitivity and sweating a lot. Felt SOB with strong fragrances and felt asthma was acting up. Noticed occasional palpitations. Denied increasing SOB, CP, dizziness, edema, or PND/Orthopnea. Appetite was ok. No fever or chills. Weight at home was 256 pounds. He was taking all medications. No tobacco use, no further ETOH use. Works full time at [employer]. He snores.    Today he returns to HF clinic for pharmacist medication titration. At last visit with APP, carvedilol was increased to 6.25 mg daily. Overall feeling good. Appetite normal. Denies dizziness, lightheadedness, fatigue, chest pain or palpitations. His breathing has improved since last visit. He walks ~6 miles a day  and is able to carry his 25 year old son up the stairs without getting winded. He continues to work at [employer] and helps others with ADLs. Weight at home is stable at 257 pounds. Takes Lasix 20 mg daily. Has not needed any additional doses. He sleeps on 2 pillows at night. No LEE. Denies PND and notes orthopnea has improved.  HF Medications: Carvedilol 6.25 mg BID Entresto 49/51 mg BID Spironolactone 25 mg daily Farxiga 10  mg daily  Lasix 20 mg daily  Has the patient been experiencing any side effects to the medications prescribed?  No  Does the patient have any problems obtaining medications due to transportation or finances?  No insurance. Uses HF fund at [facility] for heart failure medications. Novartis PAP for [employer] pending (needs to submit POI).   Understanding of regimen: good Understanding of indications: good Potential of compliance: good Patient understands to avoid NSAIDs. Patient understands to avoid decongestants.    Pertinent Lab Values: (10/17/2021) Serum creatinine 0.92, BUN 7, Potassium 4.3, Sodium 140, BNP 333.3, Magnesium 2.3  Vital Signs: Weight: 262.8 lbs (last clinic weight: 256 lbs) Blood pressure: 110/76 mmHg  Heart rate: 91 bpm   Assessment/Plan: 1. Chronic systolic CHF: Ischemic cardiomyopathy.  ? Component of NICM d/t HTN +/- PVCs. Last echo in 12/2019, EF 30-35%.  EF as low as 20-25% in 10/2018. Echo 09/2021 EF < 20%, RV severely reduced. L/RHC this admit (09/2021) demonstrated diffuse CAD but no severe stenosis to explain worsening of his EF. RHC mildly elevated PCWP w/ pulmonary venous hypertension, normal RA pressure and preserved CO.  - Stable NYHA II, he is not volume overloaded on exam. - Continue Lasix 20 mg daily. - Increase carvedilol to 9.375 mg BID. - Continue Entresto 49-51 mg BID. Plan to increase once patient assistance is approved.  - Continue spironolactone 25 mg daily. - Continue Farxiga 10 mg daily, A1c 5.9% - Will likely need ICD, will repeat echo 01/2022 to decide on ICD.  Narrow QRS so not CRT candidate.  2. CAD: Strong family history of premature CAD.  H/o inferolateral STEMI in 11/2013 with DES to LCx into OM1 and DES to RCA. LHC this admit 09/2021 demonstrated diffuse CAD but no severe stenosis to explain worsening of his EF. No chest pain. - Continue ASA 81 mg daily. - Continue atorvastatin 80 mg daily + Zetia 10 mg daily. - He has been referred to Cardiac  Rehab, he is agreeable to start. 3. PVCs: Frequent on ECG last OV. - Needs sleep study when he has insurance (will enroll through employer in the fall). - Increase carvedilol to 9.375 mg BID as above.  3. HTN: Controlled. - Continue GDMT as above. 4. Hyperlipidemia: LDL 130, off statin. Goal LDL < 55. - Continue statin + Zetia. 5. Fatty liver disease: Noted on CT abdomen.  6. SDOH: Uninsured. Missed enrollment date for insurance through his employer. [employer]FSW helping. - Patient assistance application for Entresto started. Patient aware POI is needed. Instructed patient to fax tax return to office (provided number) or bring to next visit.  - HF medications via HF fund, including [place].  Follow up at pharmacy clinic on 12/05/2021 and office visit + echo with Dr. Shirlee Latch on 01/18/2022.  Karle Plumber, PharmD, BCPS, BCCP, CPP Heart Failure Clinic Pharmacist [phone]

## 2021-11-14 NOTE — Telephone Encounter (Signed)
Medication Samples have been provided to the patient.   Drug name: Sherryll Burger     Strength: 49/51 mg      Qty: 2 bottles                 LOT: [id number] Exp.Date: 09/2023   Dosing instructions: 1 tablet BID   The patient has been instructed regarding the correct time, dose, and frequency of taking this medication, including desired effects and most common side effects.   Karle Plumber, PharmD, BCPS, CPP Heart Failure Clinic Pharmacist [phone]

## 2021-12-05 ENCOUNTER — Inpatient Hospital Stay (HOSPITAL_COMMUNITY): Admission: RE | Admit: 2021-12-05 | Payer: Self-pay | Source: Ambulatory Visit

## 2021-12-05 NOTE — Telephone Encounter (Signed)
Patient Advocate Encounter  Follow up for this medication assistance can be found in 10/17/2021 encounter created by [facility].  Clista Bernhardt, CPhT Rx Patient Advocate Phone: [phone]

## 2021-12-18 ENCOUNTER — Other Ambulatory Visit (HOSPITAL_COMMUNITY): Payer: Self-pay

## 2021-12-18 ENCOUNTER — Telehealth (HOSPITAL_COMMUNITY): Payer: Self-pay

## 2021-12-18 NOTE — Telephone Encounter (Signed)
Advanced Heart Failure Patient Advocate Encounter  Review of patient chart for medication assistance.  Entresto application has been sent to [employer], patient needs to provide POI or obtain Attestation Letter.  Due to changes in the HF Fund, seeking to change patient from [place] to [place]; will need to start application for Reginald Fernandez.  Left message on patient voicemail.  Clista Bernhardt, CPhT Rx Patient Advocate Phone: [phone]

## 2021-12-22 ENCOUNTER — Encounter (HOSPITAL_COMMUNITY): Payer: Self-pay

## 2021-12-22 ENCOUNTER — Telehealth (HOSPITAL_COMMUNITY): Payer: Self-pay

## 2021-12-22 NOTE — Telephone Encounter (Signed)
Attempted to call patient in regards to Cardiac Rehab - LM on VM Mailed letter 

## 2021-12-26 NOTE — Telephone Encounter (Signed)
Advanced Heart Failure Patient Advocate Encounter  Attempt to call patient about Novartis application (requires POI) and starting a new Administrator, sports with [employer].  No answer, left message.  Clista Bernhardt, CPhT Rx Patient Advocate Phone: [phone]

## 2022-01-01 NOTE — Telephone Encounter (Signed)
Advanced Heart Failure Patient Advocate Encounter  Third attempt to reach patient about [employer]) assistance due to changes with the HF Fund. Entresto [employer]) has also requested POI. Unable to confirm if this has been submitted.  Left voicemail. Assistance will be available in the future as needed.  Clista Bernhardt, CPhT Rx Patient Advocate Phone: [phone]

## 2022-01-04 ENCOUNTER — Other Ambulatory Visit (HOSPITAL_COMMUNITY): Payer: Self-pay

## 2022-01-13 ENCOUNTER — Other Ambulatory Visit (HOSPITAL_COMMUNITY): Payer: Self-pay

## 2022-01-15 ENCOUNTER — Other Ambulatory Visit (HOSPITAL_COMMUNITY): Payer: Self-pay

## 2022-01-18 ENCOUNTER — Encounter (HOSPITAL_COMMUNITY): Payer: Self-pay | Admitting: Cardiology

## 2022-01-18 ENCOUNTER — Ambulatory Visit (HOSPITAL_COMMUNITY): Payer: Self-pay

## 2022-01-23 ENCOUNTER — Telehealth (HOSPITAL_COMMUNITY): Payer: Self-pay

## 2022-01-23 NOTE — Telephone Encounter (Signed)
No response from pt.  Closed referral  

## 2022-02-20 ENCOUNTER — Ambulatory Visit: Payer: Self-pay | Admitting: Internal Medicine

## 2022-05-16 ENCOUNTER — Other Ambulatory Visit (HOSPITAL_COMMUNITY): Payer: Self-pay

## 2022-05-24 ENCOUNTER — Other Ambulatory Visit (HOSPITAL_COMMUNITY): Payer: Self-pay

## 2022-05-30 ENCOUNTER — Ambulatory Visit: Payer: BLUE CROSS/BLUE SHIELD | Admitting: Physician Assistant

## 2022-06-02 ENCOUNTER — Emergency Department (HOSPITAL_BASED_OUTPATIENT_CLINIC_OR_DEPARTMENT_OTHER): Payer: BLUE CROSS/BLUE SHIELD

## 2022-06-02 ENCOUNTER — Encounter (HOSPITAL_BASED_OUTPATIENT_CLINIC_OR_DEPARTMENT_OTHER): Payer: Self-pay | Admitting: Emergency Medicine

## 2022-06-02 ENCOUNTER — Emergency Department (HOSPITAL_BASED_OUTPATIENT_CLINIC_OR_DEPARTMENT_OTHER)
Admission: EM | Admit: 2022-06-02 | Discharge: 2022-06-03 | Disposition: A | Discharge status: Home / Self Care | Payer: BLUE CROSS/BLUE SHIELD | Source: Home / Self Care | Attending: Emergency Medicine | Admitting: Emergency Medicine

## 2022-06-02 ENCOUNTER — Other Ambulatory Visit: Payer: Self-pay

## 2022-06-02 DIAGNOSIS — I110 Hypertensive heart disease with heart failure: Secondary | ICD-10-CM | POA: Insufficient documentation

## 2022-06-02 DIAGNOSIS — Z7982 Long term (current) use of aspirin: Secondary | ICD-10-CM | POA: Insufficient documentation

## 2022-06-02 DIAGNOSIS — R110 Nausea: Secondary | ICD-10-CM | POA: Insufficient documentation

## 2022-06-02 DIAGNOSIS — R1010 Upper abdominal pain, unspecified: Secondary | ICD-10-CM | POA: Insufficient documentation

## 2022-06-02 DIAGNOSIS — I255 Ischemic cardiomyopathy: Secondary | ICD-10-CM | POA: Diagnosis not present

## 2022-06-02 DIAGNOSIS — I509 Heart failure, unspecified: Secondary | ICD-10-CM | POA: Insufficient documentation

## 2022-06-02 DIAGNOSIS — R0602 Shortness of breath: Secondary | ICD-10-CM

## 2022-06-02 LAB — CBC WITH DIFFERENTIAL/PLATELET
Abs Immature Granulocytes: 0.03 10*3/uL (ref 0.00–0.07)
Basophils Absolute: 0.1 10*3/uL (ref 0.0–0.1)
Basophils Relative: 1 %
Eosinophils Absolute: 0.2 10*3/uL (ref 0.0–0.5)
Eosinophils Relative: 2 %
HCT: 42.5 % (ref 39.0–52.0)
Hemoglobin: 14.3 g/dL (ref 13.0–17.0)
Immature Granulocytes: 0 %
Lymphocytes Relative: 35 %
Lymphs Abs: 3.5 10*3/uL (ref 0.7–4.0)
MCH: 30.6 pg (ref 26.0–34.0)
MCHC: 33.6 g/dL (ref 30.0–36.0)
MCV: 90.8 fL (ref 80.0–100.0)
Monocytes Absolute: 0.6 10*3/uL (ref 0.1–1.0)
Monocytes Relative: 6 %
Neutro Abs: 5.8 10*3/uL (ref 1.7–7.7)
Neutrophils Relative %: 56 %
Platelets: 236 10*3/uL (ref 150–400)
RBC: 4.68 MIL/uL (ref 4.22–5.81)
RDW: 12.9 % (ref 11.5–15.5)
WBC: 10.2 10*3/uL (ref 4.0–10.5)
nRBC: 0 % (ref 0.0–0.2)

## 2022-06-02 LAB — TROPONIN I (HIGH SENSITIVITY): Troponin I (High Sensitivity): 27 ng/L — ABNORMAL HIGH (ref <18)

## 2022-06-02 LAB — COMPREHENSIVE METABOLIC PANEL
ALT: 27 U/L (ref 0–44)
AST: 25 U/L (ref 15–41)
Albumin: 3.5 g/dL (ref 3.5–5.0)
Alkaline Phosphatase: 59 U/L (ref 38–126)
Anion gap: 8 (ref 5–15)
BUN: 17 mg/dL (ref 6–20)
CO2: 24 mmol/L (ref 22–32)
Calcium: 8.3 mg/dL — ABNORMAL LOW (ref 8.9–10.3)
Chloride: 104 mmol/L (ref 98–111)
Creatinine, Ser: 1.03 mg/dL (ref 0.61–1.24)
GFR, Estimated: >60 mL/min (ref >60)
Glucose, Bld: 139 mg/dL — ABNORMAL HIGH (ref 70–99)
Potassium: 4.1 mmol/L (ref 3.5–5.1)
Sodium: 136 mmol/L (ref 135–145)
Total Bilirubin: 2.1 mg/dL — ABNORMAL HIGH (ref 0.3–1.2)
Total Protein: 6.9 g/dL (ref 6.5–8.1)

## 2022-06-02 LAB — BRAIN NATRIURETIC PEPTIDE: B Natriuretic Peptide: 1359.4 pg/mL — ABNORMAL HIGH (ref 0.0–100.0)

## 2022-06-02 LAB — LIPASE, BLOOD: Lipase: 30 U/L (ref 11–51)

## 2022-06-02 MED ORDER — FUROSEMIDE 10 MG/ML IJ SOLN
40.0000 mg | Freq: Once | INTRAMUSCULAR | Status: AC
  Administered 2022-06-02: 40 mg via INTRAVENOUS
  Filled 2022-06-02: qty 4

## 2022-06-02 MED ORDER — FUROSEMIDE 10 MG/ML IJ SOLN
80.0000 mg | Freq: Once | INTRAMUSCULAR | Status: DC
Start: 2022-06-02 — End: 2022-06-02

## 2022-06-02 NOTE — ED Provider Notes (Signed)
[facility] EMERGENCY DEPARTMENT AT [facility] Provider Note   CSN: [id number] Arrival date & time: 06/02/22  2028     History  Chief Complaint  Patient presents with   Shortness of Breath    Reginald Fernandez is a 42 y.o. male with extensive medical history of CAD s/p inferior STEMI in 2011, STEMI in 2015, NSTEMI in 2018, CHF (EF less than 20% as of echo July 2023), HTN, HLD, prediabetes who presents to the ER complaining of intermittent shortness of breath and abdominal pain.  Patient states symptoms initially started with upper abdominal pain radiating to his back for the past week or so.  He thought that he was constipated and dehydrated, so he increase his fluid intake and took some laxatives.  His symptoms had improved, but then returned today.  He has been intermittently nauseous and felt a little bloated.  Feels short of breath with movement or lying down.   Shortness of Breath Associated symptoms: abdominal pain        Home Medications Prior to Admission medications   Medication Sig Start Date End Date Taking? Authorizing Provider  acetaminophen (TYLENOL) 500 MG tablet Take 1,000 mg by mouth 2 (two) times daily as needed for moderate pain or headache.    [provider]  aspirin EC 81 MG tablet Take 1 tablet (81 mg total) by mouth daily. 10/12/21   Lyda Jester M, PA-C  atorvastatin (LIPITOR) 80 MG tablet Take 1 tablet (80 mg total) by mouth daily at 6 PM 10/17/21   [place], Maricela Bo, FNP  carvedilol (COREG) 6.25 MG tablet Take 1.5 tablets (9.375 mg total) by mouth 2 (two) times daily with a meal. 11/14/21   Larey Dresser, MD  dapagliflozin propanediol (FARXIGA) 10 MG TABS tablet Take 1 tablet (10 mg total) by mouth daily. 10/17/21   [place], Maricela Bo, FNP  ezetimibe (ZETIA) 10 MG tablet Take 1 tablet (10 mg total) by mouth daily. 10/17/21   [place]ford, Maricela Bo, FNP  furosemide (LASIX) 20 MG tablet Take 1 tablet (20 mg total) by mouth daily. 10/17/21    [place], Maricela Bo, FNP  nitroGLYCERIN (NITROSTAT) 0.4 MG SL tablet Place 1 tablet (0.4 mg total) under the tongue every 5 (five) minutes as needed for chest pain. 10/12/21   Lyda Jester M, PA-C  sacubitril-valsartan (ENTRESTO) 49-51 MG Take 1 tablet by mouth 2 (two) times daily. 10/17/21   Larey Dresser, MD  spironolactone (ALDACTONE) 25 MG tablet Take 1 tablet (25 mg total) by mouth daily. 10/17/21   Rafael Bihari, FNP      Allergies    Penicillins    Review of Systems   Review of Systems  Respiratory:  Positive for shortness of breath.   Gastrointestinal:  Positive for abdominal pain and nausea.  All other systems reviewed and are negative.   Physical Exam Updated Vital Signs BP (!) 131/110   Pulse 95   Temp 98.9 F (37.2 C) (Oral)   Resp 20   Ht 6\' 2"  (1.88 m)   Wt 110.2 kg   SpO2 95%   BMI 31.20 kg/m  Physical Exam Vitals and nursing note reviewed.  Constitutional:      Appearance: Normal appearance.  HENT:     Head: Normocephalic and atraumatic.  Eyes:     Conjunctiva/sclera: Conjunctivae normal.  Cardiovascular:     Rate and Rhythm: Tachycardia present. Rhythm irregular.  Pulmonary:     Effort: No respiratory distress.  Breath sounds: Normal breath sounds.     Comments: Mildly increased respiratory effort Abdominal:     General: There is distension.     Palpations: Abdomen is soft.     Tenderness: There is abdominal tenderness in the epigastric area.     Comments: Abdomen very mildly distended, somewhat tender in epigastrium without guarding or rebound  Skin:    General: Skin is warm and dry.  Neurological:     General: No focal deficit present.     Mental Status: He is alert.     ED Results / Procedures / Treatments   Labs (all labs ordered are listed, but only abnormal results are displayed) Labs Reviewed  COMPREHENSIVE METABOLIC PANEL - Abnormal; Notable for the following components:      Result Value   Glucose, Bld 139 (*)     Calcium 8.3 (*)    Total Bilirubin 2.1 (*)    All other components within normal limits  BRAIN NATRIURETIC PEPTIDE - Abnormal; Notable for the following components:   B Natriuretic Peptide 1,359.4 (*)    All other components within normal limits  TROPONIN I (HIGH SENSITIVITY) - Abnormal; Notable for the following components:   Troponin I (High Sensitivity) 27 (*)    All other components within normal limits  CBC WITH DIFFERENTIAL/PLATELET  LIPASE, BLOOD  TROPONIN I (HIGH SENSITIVITY)    EKG EKG Interpretation  Date/Time:  Saturday June 02 2022 20:42:06 EDT Ventricular Rate:  105 PR Interval:  165 QRS Duration: 111 QT Interval:  349 QTC Calculation: 462 R Axis:   179 Text Interpretation: Sinus tachycardia Multiple ventricular premature complexes Biatrial enlargement Right axis deviation Low voltage, extremity leads when comapred to prior, overall similar appearance. No STEMI Confirmed by Antony Blackbird [id number]) on 06/02/2022 9:40:45 PM  Radiology DG Chest Port 1 View  Result Date: 06/02/2022 CLINICAL DATA:  Shortness of breath. EXAM: PORTABLE CHEST 1 VIEW COMPARISON:  October 09, 2021 FINDINGS: There is stable moderate to marked severity enlargement of the cardiac silhouette. Mild diffusely increased lung markings are noted, slightly more prominent within the bilateral suprahilar and bilateral infrahilar regions. Mild prominence of the perihilar pulmonary vasculature is also seen. There is no evidence of focal consolidation, pleural effusion or pneumothorax. The visualized skeletal structures are unremarkable. IMPRESSION: 1. Stable cardiomegaly with mild pulmonary vascular congestion. Electronically Signed   By: Virgina Norfolk M.D.   On: 06/02/2022 22:19    Procedures Procedures    Medications Ordered in ED Medications  furosemide (LASIX) injection 40 mg (40 mg Intravenous Given 06/02/22 2326)    ED Course/ Medical Decision Making/ A&P                             Medical  Decision Making Amount and/or Complexity of Data Reviewed Labs: ordered. Radiology: ordered.   This patient is a 42 y.o. male  who presents to the ED for concern of shortness of breath.   Differential diagnoses prior to evaluation: The emergent differential diagnosis includes, but is not limited to,  CHF, pericardial effusion/tamponade, arrhythmias, ACS, COPD, asthma, bronchitis, pneumonia, pneumothorax, PE, anemia. This is not an exhaustive differential.   Past Medical History / Co-morbidities: CAD s/p inferior STEMI in 2011, STEMI in 2015, NSTEMI in 2018, CHF HTN, HLD, prediabetes  Additional history: Chart reviewed. Pertinent results include: LVEF less than 20% as of echo July 2023  Physical Exam: Physical exam performed. The pertinent findings include: Mildly tachycardic, and  rhythm irregular.  Lung sounds clear, with mild increased respiratory effort.  Lab Tests/Imaging studies: I personally interpreted labs/imaging and the pertinent results include: No leukocytosis, normal hemoglobin.  CMP grossly unremarkable.  Normal lipase.  Initial troponin 27, delta troponin pending.  BNP 1359.4.  X-ray with cardiomegaly and mild pulmonary vascular congestion, no effusions or consolidation. I agree with the radiologist interpretation.  Cardiac monitoring: EKG obtained and interpreted by my attending physician which shows: [place] tachycardia with multiple PVCs, stable compared to prior   Medications: I ordered medication including IV Lasix.  I have reviewed the patients home medicines and have made adjustments as needed.   Disposition: Patient discussed and care transferred to attending physician Dr. Stark Jock at shift change. Please see his/her note for further details regarding further ED course and disposition. Plan at time of handoff is reevaluate after ambulation trial and follow up on delta troponin. If patient is showing evidence of good diuresis after lasix, maintains normal O2 sats while  ambulating, and has stable delta troponin, anticipate d/c to home with increased lasix for several days and lab recheck with PCP. Strongly encouraged cardiology follow up. Patient agreeable to this plan at time of shift change.    Final Clinical Impression(s) / ED Diagnoses Final diagnoses:  Shortness of breath  Acute on chronic congestive heart failure, unspecified heart failure type ([place])  Upper abdominal pain    Rx / DC Orders ED Discharge Orders     None      Portions of this report may have been transcribed using voice recognition software. Every effort was made to ensure accuracy; however, inadvertent computerized transcription errors may be present.  I discussed this case with my attending physician Dr. Sherry Ruffing who cosigned this note including patient's presenting symptoms, physical exam, and planned diagnostics and interventions. Attending physician stated agreement with plan or made changes to plan which were implemented.     Kateri Plummer, PA-C 06/03/22 0001    Veryl Speak, MD 06/03/22 [phone]

## 2022-06-02 NOTE — ED Triage Notes (Signed)
Reports SHOB intermittently over the last week, worse with movement or laying down. He also mentions epigastric pain that radiates to his back "since the beginning of the week". He states the epigastric pain has been constant. Pt reports hx CHF. Denies LE edema or weight gain. He does mention "occasional nausea, and abd bloating".

## 2022-06-02 NOTE — ED Notes (Signed)
Lab made aware of troponin add on

## 2022-06-02 NOTE — ED Notes (Signed)
Lab notified of new orders. 

## 2022-06-02 NOTE — Discharge Instructions (Addendum)
You were seen in the ER for abdominal discomfort and shortness of breath.  Your blood work showed an increase in your heart failure. We have given you double your dose of lasix through your IV. I want you to take two tablets of lasix daily (40 mg total) for the next 4 days, and have your lab work rechecked by your doctor next week.  I think getting off some of the fluid you have will help with some of your abdominal discomfort.   It is incredibly important you follow up with the cardiologist as soon as possible for repeat echocardiogram.  Continue to monitor how you're doing and return to the ER for new or worsening symptoms.

## 2022-06-03 ENCOUNTER — Emergency Department (HOSPITAL_BASED_OUTPATIENT_CLINIC_OR_DEPARTMENT_OTHER): Payer: BLUE CROSS/BLUE SHIELD

## 2022-06-03 ENCOUNTER — Inpatient Hospital Stay (HOSPITAL_COMMUNITY)
Admission: EM | Admit: 2022-06-03 | Discharge: 2022-06-08 | DRG: 291 | Disposition: A | Discharge status: Home / Self Care | Payer: BLUE CROSS/BLUE SHIELD | Attending: Student | Admitting: Student

## 2022-06-03 ENCOUNTER — Other Ambulatory Visit: Payer: Self-pay

## 2022-06-03 ENCOUNTER — Encounter (HOSPITAL_COMMUNITY): Payer: Self-pay

## 2022-06-03 ENCOUNTER — Emergency Department (HOSPITAL_COMMUNITY): Payer: BLUE CROSS/BLUE SHIELD

## 2022-06-03 DIAGNOSIS — Z955 Presence of coronary angioplasty implant and graft: Secondary | ICD-10-CM

## 2022-06-03 DIAGNOSIS — Z6831 Body mass index (BMI) 31.0-31.9, adult: Secondary | ICD-10-CM

## 2022-06-03 DIAGNOSIS — I82412 Acute embolism and thrombosis of left femoral vein: Secondary | ICD-10-CM | POA: Diagnosis present

## 2022-06-03 DIAGNOSIS — I2699 Other pulmonary embolism without acute cor pulmonale: Secondary | ICD-10-CM | POA: Clinically undetermined

## 2022-06-03 DIAGNOSIS — Z880 Allergy status to penicillin: Secondary | ICD-10-CM

## 2022-06-03 DIAGNOSIS — I214 Non-ST elevation (NSTEMI) myocardial infarction: Secondary | ICD-10-CM | POA: Diagnosis present

## 2022-06-03 DIAGNOSIS — E785 Hyperlipidemia, unspecified: Secondary | ICD-10-CM | POA: Diagnosis present

## 2022-06-03 DIAGNOSIS — K5900 Constipation, unspecified: Secondary | ICD-10-CM | POA: Diagnosis not present

## 2022-06-03 DIAGNOSIS — Z7982 Long term (current) use of aspirin: Secondary | ICD-10-CM

## 2022-06-03 DIAGNOSIS — I5042 Chronic combined systolic (congestive) and diastolic (congestive) heart failure: Secondary | ICD-10-CM | POA: Diagnosis present

## 2022-06-03 DIAGNOSIS — I5082 Biventricular heart failure: Secondary | ICD-10-CM | POA: Diagnosis present

## 2022-06-03 DIAGNOSIS — Z7984 Long term (current) use of oral hypoglycemic drugs: Secondary | ICD-10-CM

## 2022-06-03 DIAGNOSIS — I82431 Acute embolism and thrombosis of right popliteal vein: Secondary | ICD-10-CM | POA: Diagnosis present

## 2022-06-03 DIAGNOSIS — F17290 Nicotine dependence, other tobacco product, uncomplicated: Secondary | ICD-10-CM | POA: Diagnosis present

## 2022-06-03 DIAGNOSIS — K760 Fatty (change of) liver, not elsewhere classified: Secondary | ICD-10-CM | POA: Diagnosis present

## 2022-06-03 DIAGNOSIS — Z83438 Family history of other disorder of lipoprotein metabolism and other lipidemia: Secondary | ICD-10-CM

## 2022-06-03 DIAGNOSIS — R079 Chest pain, unspecified: Secondary | ICD-10-CM

## 2022-06-03 DIAGNOSIS — I2489 Other forms of acute ischemic heart disease: Secondary | ICD-10-CM | POA: Diagnosis present

## 2022-06-03 DIAGNOSIS — I5043 Acute on chronic combined systolic (congestive) and diastolic (congestive) heart failure: Secondary | ICD-10-CM | POA: Diagnosis present

## 2022-06-03 DIAGNOSIS — I2510 Atherosclerotic heart disease of native coronary artery without angina pectoris: Secondary | ICD-10-CM | POA: Diagnosis present

## 2022-06-03 DIAGNOSIS — Z8249 Family history of ischemic heart disease and other diseases of the circulatory system: Secondary | ICD-10-CM

## 2022-06-03 DIAGNOSIS — R7303 Prediabetes: Secondary | ICD-10-CM | POA: Diagnosis present

## 2022-06-03 DIAGNOSIS — R7989 Other specified abnormal findings of blood chemistry: Principal | ICD-10-CM

## 2022-06-03 DIAGNOSIS — J9811 Atelectasis: Secondary | ICD-10-CM | POA: Diagnosis present

## 2022-06-03 DIAGNOSIS — Z79899 Other long term (current) drug therapy: Secondary | ICD-10-CM

## 2022-06-03 DIAGNOSIS — I5023 Acute on chronic systolic (congestive) heart failure: Secondary | ICD-10-CM

## 2022-06-03 DIAGNOSIS — R188 Other ascites: Secondary | ICD-10-CM | POA: Diagnosis present

## 2022-06-03 DIAGNOSIS — I110 Hypertensive heart disease with heart failure: Principal | ICD-10-CM | POA: Diagnosis present

## 2022-06-03 DIAGNOSIS — E669 Obesity, unspecified: Secondary | ICD-10-CM | POA: Diagnosis present

## 2022-06-03 DIAGNOSIS — I252 Old myocardial infarction: Secondary | ICD-10-CM

## 2022-06-03 DIAGNOSIS — Z1152 Encounter for screening for COVID-19: Secondary | ICD-10-CM

## 2022-06-03 DIAGNOSIS — I82442 Acute embolism and thrombosis of left tibial vein: Secondary | ICD-10-CM | POA: Diagnosis present

## 2022-06-03 DIAGNOSIS — I255 Ischemic cardiomyopathy: Secondary | ICD-10-CM | POA: Diagnosis present

## 2022-06-03 DIAGNOSIS — E860 Dehydration: Secondary | ICD-10-CM | POA: Diagnosis present

## 2022-06-03 DIAGNOSIS — N10 Acute tubulo-interstitial nephritis: Secondary | ICD-10-CM | POA: Insufficient documentation

## 2022-06-03 DIAGNOSIS — B952 Enterococcus as the cause of diseases classified elsewhere: Secondary | ICD-10-CM | POA: Diagnosis present

## 2022-06-03 LAB — URINALYSIS, MICROSCOPIC (REFLEX)

## 2022-06-03 LAB — TROPONIN I (HIGH SENSITIVITY)
Troponin I (High Sensitivity): 220 ng/L (ref <18)
Troponin I (High Sensitivity): 30 ng/L — ABNORMAL HIGH (ref <18)

## 2022-06-03 LAB — CBC
HCT: 44.5 % (ref 39.0–52.0)
Hemoglobin: 15.1 g/dL (ref 13.0–17.0)
MCH: 30.9 pg (ref 26.0–34.0)
MCHC: 33.9 g/dL (ref 30.0–36.0)
MCV: 91.2 fL (ref 80.0–100.0)
Platelets: 228 10*3/uL (ref 150–400)
RBC: 4.88 MIL/uL (ref 4.22–5.81)
RDW: 12.9 % (ref 11.5–15.5)
WBC: 11.1 10*3/uL — ABNORMAL HIGH (ref 4.0–10.5)
nRBC: 0 % (ref 0.0–0.2)

## 2022-06-03 LAB — BASIC METABOLIC PANEL
Anion gap: 12 (ref 5–15)
BUN: 11 mg/dL (ref 6–20)
CO2: 27 mmol/L (ref 22–32)
Calcium: 8.6 mg/dL — ABNORMAL LOW (ref 8.9–10.3)
Chloride: 98 mmol/L (ref 98–111)
Creatinine, Ser: 1.24 mg/dL (ref 0.61–1.24)
GFR, Estimated: >60 mL/min (ref >60)
Glucose, Bld: 128 mg/dL — ABNORMAL HIGH (ref 70–99)
Potassium: 3.9 mmol/L (ref 3.5–5.1)
Sodium: 137 mmol/L (ref 135–145)

## 2022-06-03 LAB — URINALYSIS, ROUTINE W REFLEX MICROSCOPIC
Bilirubin Urine: NEGATIVE
Glucose, UA: NEGATIVE mg/dL
Ketones, ur: NEGATIVE mg/dL
Leukocytes,Ua: NEGATIVE
Nitrite: NEGATIVE
Protein, ur: 100 mg/dL — AB
Specific Gravity, Urine: 1.015 (ref 1.005–1.030)
pH: 6.5 (ref 5.0–8.0)

## 2022-06-03 LAB — MAGNESIUM: Magnesium: 1.9 mg/dL (ref 1.7–2.4)

## 2022-06-03 LAB — CBG MONITORING, ED: Glucose-Capillary: 126 mg/dL — ABNORMAL HIGH (ref 70–99)

## 2022-06-03 MED ORDER — HEPARIN (PORCINE) 25000 UT/250ML-% IV SOLN
1700.0000 [IU]/h | INTRAVENOUS | Status: DC
  Administered 2022-06-04: 1400 [IU]/h via INTRAVENOUS
  Filled 2022-06-03: qty 250

## 2022-06-03 MED ORDER — SODIUM CHLORIDE 0.9 % IV BOLUS
250.0000 mL | Freq: Once | INTRAVENOUS | Status: AC
  Administered 2022-06-03: 250 mL via INTRAVENOUS

## 2022-06-03 MED ORDER — IOHEXOL 300 MG/ML  SOLN
100.0000 mL | Freq: Once | INTRAMUSCULAR | Status: AC | PRN
  Administered 2022-06-03: 100 mL via INTRAVENOUS

## 2022-06-03 MED ORDER — HEPARIN BOLUS VIA INFUSION
4000.0000 [IU] | Freq: Once | INTRAVENOUS | Status: AC
  Administered 2022-06-04: 4000 [IU] via INTRAVENOUS
  Filled 2022-06-03: qty 4000

## 2022-06-03 NOTE — Progress Notes (Signed)
ANTICOAGULATION CONSULT NOTE - Initial Consult  Pharmacy Consult for heparin Indication: chest pain/ACS  Allergies  Allergen Reactions   Penicillins Other (See Comments)    Childhood allergy Unknown reaction  Did it involve swelling of the face/tongue/throat, SOB, or low BP? No Did it involve sudden or severe rash/hives, skin peeling, or any reaction on the inside of your mouth or nose? No Did you need to seek medical attention at a hospital or doctor's office? No When did it last happen?      child If all above answers are "NO", may proceed with cephalosporin use.    Patient Measurements: Height: 6\' 2"  (188 cm) Weight: 108.9 kg (240 lb) IBW/kg (Calculated) : 82.2 Heparin Dosing Weight: 105kg  Vital Signs: Temp: 98.2 F (36.8 C) (03/17 2032) Temp Source: Oral (03/17 2032) BP: 128/111 (03/17 2032) Pulse Rate: 104 (03/17 2032)  Labs: Recent Labs    06/02/22 2047 06/02/22 2142 06/02/22 2348 06/03/22 2020 06/03/22 2210  HGB 14.3  --   --  15.1  --   HCT 42.5  --   --  44.5  --   PLT 236  --   --  228  --   CREATININE 1.03  --   --  1.24  --   TROPONINIHS  --  27* 30*  --  220*    Estimated Creatinine Clearance: 103 mL/min (by C-G formula based on SCr of 1.24 mg/dL).   Medical History: Past Medical History:  Diagnosis Date   CAD S/P percutaneous coronary angioplasty 11/20/2013   a. 09/2009: EKG with Inf STEMI - no obstructive CAD;  b. 11/2013 Inflat STEMI/PCI: LM nl, LAD 20p, D1 sm - diff dzs, D2 large - nl, LCX 95-99, OM1 100 (3.5x38 Promus Premier DES), RCA 95-32m, 80d (3.0x20 and 3.0x16 Promus Premier DES') - normal EF;  c. NSTEMI 5/18 - patent stents, otw minimal CAD.- EF by Echo 35-40%; d. NSTEMI 10/2018 - ? culprit - Small branch of  D2,Med Rx,.  EF by Echo ~25%   Cardiomyopathy, ischemic 07/2016   h/o Inf STEMI 11/2013 - EF was "Normal"; b) NSTEMI 07/2016 (NO Cultprit on Cath) - Echo EF 35-40% (Basal Ant-Lat HK, basal-mid Inferolateral & apical Akinesis); c) NSTEMI  10/2018 -felt to be occluded small branch of D2, Echo EF further decreased to 20-25%.    Essential hypertension    Hyperlipidemia with target LDL less than 100    Marijuana abuse    Migraine    Morbid obesity (HCC)    ST-segment elevation myocardial infarction (STEMI) of inferior wall ([place]) 11/20/2013    Assessment: 42yo male with known cardiac PMH c/o generalized fatigue, weakness, and "waves of CP", initial troponin mildly elevated and now rising (27>30>220) >> to begin heparin.  Goal of Therapy:  Heparin level 0.3-0.7 units/ml Monitor platelets by anticoagulation protocol: Yes   Plan:  Heparin 4000 units IV bolus x1 followed by infusion at 1400 units/hr. Monitor heparin levels and CBC.  Wynona Neat, PharmD, BCPS  06/03/2022,11:44 PM

## 2022-06-03 NOTE — ED Notes (Signed)
Trop 220, Dr.  Kirby Funk notified

## 2022-06-03 NOTE — ED Triage Notes (Signed)
Pt arrived to triage complaining of generalized fatigue. Pt states that he was seen yesterday and given lasix and then took extra at home today as advised at discharge. Pt states that he thinks he is now deydrated.  Complaining of generalized weakness and pain all over his body. States that his kidneys hurts and he is having waves of chest pain   Hx of MI and 3 stents.

## 2022-06-03 NOTE — ED Provider Notes (Signed)
[facility] Provider Note   CSN: [id number] Arrival date & time: 06/03/22  2028     History {Add pertinent medical, surgical, social history, OB history to HPI:1} Chief Complaint  Patient presents with   Weakness    Reginald Fernandez is a 42 y.o. male, hx of multiple MIs, ischemic cardiomyopathy, with EF less than 20%, who presents to the ED secondary to feeling fatigued, weak, and just generally unwell.  He states he was seen in the ER yesterday for some abdominal pain, and told that he had a bunch of fluid on his stomach, he was given 80 mg of IV Lasix, and his Lasix was increased to 40 mg daily.  He states since then he has felt very weak and fatigued, and is now having some chest pain.  He states the chest pain is in the middle of his chest, and is described as pressurized, and associated nausea.  No radiation of the pain.  Notes the last time he had this, they told him that he had had a heart attack.    Home Medications Prior to Admission medications   Medication Sig Start Date End Date Taking? Authorizing Provider  acetaminophen (TYLENOL) 500 MG tablet Take 1,000 mg by mouth 2 (two) times daily as needed for moderate pain or headache.    [provider]  aspirin EC 81 MG tablet Take 1 tablet (81 mg total) by mouth daily. 10/12/21   Lyda Jester M, PA-C  atorvastatin (LIPITOR) 80 MG tablet Take 1 tablet (80 mg total) by mouth daily at 6 PM 10/17/21   Milford, Maricela Bo, FNP  carvedilol (COREG) 6.25 MG tablet Take 1.5 tablets (9.375 mg total) by mouth 2 (two) times daily with a meal. 11/14/21   Larey Dresser, MD  dapagliflozin propanediol (FARXIGA) 10 MG TABS tablet Take 1 tablet (10 mg total) by mouth daily. 10/17/21   Milford, Maricela Bo, FNP  ezetimibe (ZETIA) 10 MG tablet Take 1 tablet (10 mg total) by mouth daily. 10/17/21   Milford, Maricela Bo, FNP  furosemide (LASIX) 20 MG tablet Take 1 tablet (20 mg total) by mouth daily. 10/17/21    Milford, Maricela Bo, FNP  nitroGLYCERIN (NITROSTAT) 0.4 MG SL tablet Place 1 tablet (0.4 mg total) under the tongue every 5 (five) minutes as needed for chest pain. 10/12/21   Lyda Jester M, PA-C  sacubitril-valsartan (ENTRESTO) 49-51 MG Take 1 tablet by mouth 2 (two) times daily. 10/17/21   Larey Dresser, MD  spironolactone (ALDACTONE) 25 MG tablet Take 1 tablet (25 mg total) by mouth daily. 10/17/21   Rafael Bihari, FNP      Allergies    Penicillins    Review of Systems   Review of Systems  Cardiovascular:  Positive for chest pain.  Neurological:  Positive for weakness.    Physical Exam Updated Vital Signs BP (!) 128/111   Pulse (!) 104   Temp 98.2 F (36.8 C) (Oral)   Resp 18   Ht 6\' 2"  (1.88 m)   Wt 108.9 kg   SpO2 98%   BMI 30.81 kg/m  Physical Exam  ED Results / Procedures / Treatments   Labs (all labs ordered are listed, but only abnormal results are displayed) Labs Reviewed  BASIC METABOLIC PANEL - Abnormal; Notable for the following components:      Result Value   Glucose, Bld 128 (*)    Calcium 8.6 (*)    All other components  within normal limits  CBC - Abnormal; Notable for the following components:   WBC 11.1 (*)    All other components within normal limits  CBG MONITORING, ED - Abnormal; Notable for the following components:   Glucose-Capillary 126 (*)    All other components within normal limits  URINALYSIS, ROUTINE W REFLEX MICROSCOPIC  MAGNESIUM  TROPONIN I (HIGH SENSITIVITY)    EKG None  Radiology CT ABDOMEN PELVIS W CONTRAST  Result Date: 06/03/2022 CLINICAL DATA:  Shortness of breath, epigastric abdominal pain. EXAM: CT ABDOMEN AND PELVIS WITH CONTRAST TECHNIQUE: Multidetector CT imaging of the abdomen and pelvis was performed using the standard protocol following bolus administration of intravenous contrast. RADIATION DOSE REDUCTION: This exam was performed according to the departmental dose-optimization program which includes  automated exposure control, adjustment of the mA and/or kV according to patient size and/or use of iterative reconstruction technique. CONTRAST:  166mL OMNIPAQUE IOHEXOL 300 MG/ML  SOLN COMPARISON:  10/09/2021. FINDINGS: Lower chest: The heart is enlarged and coronary artery calcifications are noted. There is a Reginald Fernandez right pleural effusion. Scattered ground-glass opacities are noted in the lower lobes bilaterally, greater on the right than on the left. Hepatobiliary: No focal abnormality in the liver. No cholelithiasis. There is mild gallbladder wall thickening, unchanged from the previous exam. No biliary ductal dilatation. Pancreas: Unremarkable. No pancreatic ductal dilatation or surrounding inflammatory changes. Spleen: Normal in size without focal abnormality. Adrenals/Urinary Tract: No adrenal nodule or mass. Scattered hypoenhancing regions are noted in the kidneys bilaterally and most pronounced in the lower pole of the left kidney. Perinephric fat stranding is noted on the left. No renal calculus or hydronephrosis. No obstructive uropathy bilaterally. The bladder is unremarkable. Stomach/Bowel: Stomach is within normal limits. Appendix appears normal. No evidence of bowel wall thickening, distention, or inflammatory changes. No free air or pneumatosis. Scattered diverticula are present along the colon without evidence of diverticulitis. Vascular/Lymphatic: Aortic atherosclerosis. No enlarged abdominal or pelvic lymph nodes. Reproductive: Prostate gland is mildly enlarged. Other: A Reginald Fernandez amount of free fluid is noted in the right pericolic gutter and pelvis. Musculoskeletal: Degenerative changes are present in the thoracolumbar spine. There is sclerosis at the sacroiliac joint on the left, suggesting sacroiliitis. No acute or suspicious osseous abnormality. IMPRESSION: 1. Patchy hypoenhancement of the kidneys bilaterally, most pronounced in the lower pole of the left kidney suggesting pyelonephritis. No renal  calculus or obstructive uropathy bilaterally. 2. Mildly enlarged prostate gland. 3. Reginald Fernandez right pleural effusion with scattered ground-glass opacities in the lower lobes bilaterally, possible edema or infiltrate. 4. Mild gallbladder wall thickening which is unchanged from the previous exam. No evidence of cholelithiasis. 5. Reginald Fernandez ascites. 6. Aortic atherosclerosis and coronary artery calcifications. Electronically Signed   By: Brett Fairy M.D.   On: 06/03/2022 01:20   DG Chest Port 1 View  Result Date: 06/02/2022 CLINICAL DATA:  Shortness of breath. EXAM: PORTABLE CHEST 1 VIEW COMPARISON:  October 09, 2021 FINDINGS: There is stable moderate to marked severity enlargement of the cardiac silhouette. Mild diffusely increased lung markings are noted, slightly more prominent within the bilateral suprahilar and bilateral infrahilar regions. Mild prominence of the perihilar pulmonary vasculature is also seen. There is no evidence of focal consolidation, pleural effusion or pneumothorax. The visualized skeletal structures are unremarkable. IMPRESSION: 1. Stable cardiomegaly with mild pulmonary vascular congestion. Electronically Signed   By: Virgina Norfolk M.D.   On: 06/02/2022 22:19    Procedures Procedures  {Document cardiac monitor, telemetry assessment procedure when appropriate:1}  Medications  Ordered in ED Medications  sodium chloride 0.9 % bolus 250 mL (250 mLs Intravenous New Bag/Given 06/03/22 2201)    ED Course/ Medical Decision Making/ A&P   {   Click here for ABCD2, HEART and other calculatorsREFRESH Note before signing :1}                          Medical Decision Making Amount and/or Complexity of Data Reviewed Labs: ordered.   ***  {Document critical care time when appropriate:1} {Document review of labs and clinical decision tools ie heart score, Chads2Vasc2 etc:1}  {Document your independent review of radiology images, and any outside records:1} {Document your discussion with  family members, caretakers, and with consultants:1} {Document social determinants of health affecting pt's care:1} {Document your decision making why or why not admission, treatments were needed:1} Final Clinical Impression(s) / ED Diagnoses Final diagnoses:  None    Rx / DC Orders ED Discharge Orders     None

## 2022-06-03 NOTE — ED Notes (Addendum)
Patient's oxygen level remained between 98-100% and their pulse remained between 114-115 per minute while ambulating. Patient mentioned that he was slightly short of breath by the end. Patient also mentioned feeling sore in the abdomen.

## 2022-06-03 NOTE — ED Provider Notes (Incomplete)
[facility] Provider Note   CSN: [id number] Arrival date & time: 06/03/22  2028     History {Add pertinent medical, surgical, social history, OB history to HPI:1} Chief Complaint  Patient presents with  . Weakness    Reginald Fernandez is a 42 y.o. male, hx of multiple MIs, ischemic cardiomyopathy, with EF less than 20%, who presents to the ED secondary to feeling fatigued, weak, and just generally unwell.  He states he was seen in the ER yesterday for some abdominal pain, and told that he had a bunch of fluid on his stomach, he was given 80 mg of IV Lasix, and his Lasix was increased to 40 mg daily.  He states since then he has felt very weak and fatigued, and is now having some chest pain.  He states the chest pain is in the middle of his chest, and is described as pressurized, and associated nausea.  No radiation of the pain.  Notes the last time he had this, they told him that he had had a heart attack.    Last cath 09/2021--diffuse CAD  Home Medications Prior to Admission medications   Medication Sig Start Date End Date Taking? Authorizing Provider  acetaminophen (TYLENOL) 500 MG tablet Take 1,000 mg by mouth 2 (two) times daily as needed for moderate pain or headache.    [provider]  aspirin EC 81 MG tablet Take 1 tablet (81 mg total) by mouth daily. 10/12/21   Lyda Jester M, PA-C  atorvastatin (LIPITOR) 80 MG tablet Take 1 tablet (80 mg total) by mouth daily at 6 PM 10/17/21   Milford, Maricela Bo, FNP  carvedilol (COREG) 6.25 MG tablet Take 1.5 tablets (9.375 mg total) by mouth 2 (two) times daily with a meal. 11/14/21   Larey Dresser, MD  dapagliflozin propanediol (FARXIGA) 10 MG TABS tablet Take 1 tablet (10 mg total) by mouth daily. 10/17/21   Milford, Maricela Bo, FNP  ezetimibe (ZETIA) 10 MG tablet Take 1 tablet (10 mg total) by mouth daily. 10/17/21   Milford, Maricela Bo, FNP  furosemide (LASIX) 20 MG tablet Take 1 tablet  (20 mg total) by mouth daily. 10/17/21   Milford, Maricela Bo, FNP  nitroGLYCERIN (NITROSTAT) 0.4 MG SL tablet Place 1 tablet (0.4 mg total) under the tongue every 5 (five) minutes as needed for chest pain. 10/12/21   Lyda Jester M, PA-C  sacubitril-valsartan (ENTRESTO) 49-51 MG Take 1 tablet by mouth 2 (two) times daily. 10/17/21   Larey Dresser, MD  spironolactone (ALDACTONE) 25 MG tablet Take 1 tablet (25 mg total) by mouth daily. 10/17/21   Rafael Bihari, FNP      Allergies    Penicillins    Review of Systems   Review of Systems  Cardiovascular:  Positive for chest pain.  Neurological:  Positive for weakness.    Physical Exam Updated Vital Signs BP (!) 128/111   Pulse (!) 104   Temp 98.2 F (36.8 C) (Oral)   Resp 18   Ht 6\' 2"  (1.88 m)   Wt 108.9 kg   SpO2 98%   BMI 30.81 kg/m  Physical Exam  ED Results / Procedures / Treatments   Labs (all labs ordered are listed, but only abnormal results are displayed) Labs Reviewed  BASIC METABOLIC PANEL - Abnormal; Notable for the following components:      Result Value   Glucose, Bld 128 (*)    Calcium 8.6 (*)  All other components within normal limits  CBC - Abnormal; Notable for the following components:   WBC 11.1 (*)    All other components within normal limits  CBG MONITORING, ED - Abnormal; Notable for the following components:   Glucose-Capillary 126 (*)    All other components within normal limits  URINALYSIS, ROUTINE W REFLEX MICROSCOPIC  MAGNESIUM  TROPONIN I (HIGH SENSITIVITY)    EKG None  Radiology CT ABDOMEN PELVIS W CONTRAST  Result Date: 06/03/2022 CLINICAL DATA:  Shortness of breath, epigastric abdominal pain. EXAM: CT ABDOMEN AND PELVIS WITH CONTRAST TECHNIQUE: Multidetector CT imaging of the abdomen and pelvis was performed using the standard protocol following bolus administration of intravenous contrast. RADIATION DOSE REDUCTION: This exam was performed according to the departmental  dose-optimization program which includes automated exposure control, adjustment of the mA and/or kV according to patient size and/or use of iterative reconstruction technique. CONTRAST:  187mL OMNIPAQUE IOHEXOL 300 MG/ML  SOLN COMPARISON:  10/09/2021. FINDINGS: Lower chest: The heart is enlarged and coronary artery calcifications are noted. There is a Makoa Satz right pleural effusion. Scattered ground-glass opacities are noted in the lower lobes bilaterally, greater on the right than on the left. Hepatobiliary: No focal abnormality in the liver. No cholelithiasis. There is mild gallbladder wall thickening, unchanged from the previous exam. No biliary ductal dilatation. Pancreas: Unremarkable. No pancreatic ductal dilatation or surrounding inflammatory changes. Spleen: Normal in size without focal abnormality. Adrenals/Urinary Tract: No adrenal nodule or mass. Scattered hypoenhancing regions are noted in the kidneys bilaterally and most pronounced in the lower pole of the left kidney. Perinephric fat stranding is noted on the left. No renal calculus or hydronephrosis. No obstructive uropathy bilaterally. The bladder is unremarkable. Stomach/Bowel: Stomach is within normal limits. Appendix appears normal. No evidence of bowel wall thickening, distention, or inflammatory changes. No free air or pneumatosis. Scattered diverticula are present along the colon without evidence of diverticulitis. Vascular/Lymphatic: Aortic atherosclerosis. No enlarged abdominal or pelvic lymph nodes. Reproductive: Prostate gland is mildly enlarged. Other: A Caydence Koenig amount of free fluid is noted in the right pericolic gutter and pelvis. Musculoskeletal: Degenerative changes are present in the thoracolumbar spine. There is sclerosis at the sacroiliac joint on the left, suggesting sacroiliitis. No acute or suspicious osseous abnormality. IMPRESSION: 1. Patchy hypoenhancement of the kidneys bilaterally, most pronounced in the lower pole of the left  kidney suggesting pyelonephritis. No renal calculus or obstructive uropathy bilaterally. 2. Mildly enlarged prostate gland. 3. Callyn Severtson right pleural effusion with scattered ground-glass opacities in the lower lobes bilaterally, possible edema or infiltrate. 4. Mild gallbladder wall thickening which is unchanged from the previous exam. No evidence of cholelithiasis. 5. Kelsea Mousel ascites. 6. Aortic atherosclerosis and coronary artery calcifications. Electronically Signed   By: Brett Fairy M.D.   On: 06/03/2022 01:20   DG Chest Port 1 View  Result Date: 06/02/2022 CLINICAL DATA:  Shortness of breath. EXAM: PORTABLE CHEST 1 VIEW COMPARISON:  October 09, 2021 FINDINGS: There is stable moderate to marked severity enlargement of the cardiac silhouette. Mild diffusely increased lung markings are noted, slightly more prominent within the bilateral suprahilar and bilateral infrahilar regions. Mild prominence of the perihilar pulmonary vasculature is also seen. There is no evidence of focal consolidation, pleural effusion or pneumothorax. The visualized skeletal structures are unremarkable. IMPRESSION: 1. Stable cardiomegaly with mild pulmonary vascular congestion. Electronically Signed   By: Virgina Norfolk M.D.   On: 06/02/2022 22:19    Procedures Procedures  {Document cardiac monitor, telemetry assessment procedure when  appropriate:1}  Medications Ordered in ED Medications  sodium chloride 0.9 % bolus 250 mL (250 mLs Intravenous New Bag/Given 06/03/22 2201)    ED Course/ Medical Decision Making/ A&P   {   Click here for ABCD2, HEART and other calculatorsREFRESH Note before signing :1}                          Medical Decision Making Amount and/or Complexity of Data Reviewed Labs: ordered.   ***  {Document critical care time when appropriate:1} {Document review of labs and clinical decision tools ie heart score, Chads2Vasc2 etc:1}  {Document your independent review of radiology images, and any outside  records:1} {Document your discussion with family members, caretakers, and with consultants:1} {Document social determinants of health affecting pt's care:1} {Document your decision making why or why not admission, treatments were needed:1} Final Clinical Impression(s) / ED Diagnoses Final diagnoses:  None    Rx / DC Orders ED Discharge Orders     None

## 2022-06-03 NOTE — ED Notes (Signed)
Pt laying down, resting. Alert to voice. No complaints at this time.

## 2022-06-03 NOTE — ED Notes (Signed)
IV pump changed

## 2022-06-04 ENCOUNTER — Other Ambulatory Visit (HOSPITAL_COMMUNITY): Payer: BLUE CROSS/BLUE SHIELD

## 2022-06-04 ENCOUNTER — Inpatient Hospital Stay (HOSPITAL_COMMUNITY): Payer: BLUE CROSS/BLUE SHIELD

## 2022-06-04 DIAGNOSIS — I25118 Atherosclerotic heart disease of native coronary artery with other forms of angina pectoris: Secondary | ICD-10-CM | POA: Diagnosis not present

## 2022-06-04 DIAGNOSIS — I214 Non-ST elevation (NSTEMI) myocardial infarction: Secondary | ICD-10-CM

## 2022-06-04 DIAGNOSIS — I5023 Acute on chronic systolic (congestive) heart failure: Secondary | ICD-10-CM | POA: Diagnosis not present

## 2022-06-04 DIAGNOSIS — I252 Old myocardial infarction: Secondary | ICD-10-CM | POA: Diagnosis not present

## 2022-06-04 DIAGNOSIS — R7303 Prediabetes: Secondary | ICD-10-CM | POA: Diagnosis present

## 2022-06-04 DIAGNOSIS — K5900 Constipation, unspecified: Secondary | ICD-10-CM | POA: Diagnosis not present

## 2022-06-04 DIAGNOSIS — I2489 Other forms of acute ischemic heart disease: Secondary | ICD-10-CM | POA: Diagnosis present

## 2022-06-04 DIAGNOSIS — M7989 Other specified soft tissue disorders: Secondary | ICD-10-CM | POA: Diagnosis not present

## 2022-06-04 DIAGNOSIS — N10 Acute tubulo-interstitial nephritis: Secondary | ICD-10-CM

## 2022-06-04 DIAGNOSIS — Z1152 Encounter for screening for COVID-19: Secondary | ICD-10-CM | POA: Diagnosis not present

## 2022-06-04 DIAGNOSIS — R7989 Other specified abnormal findings of blood chemistry: Secondary | ICD-10-CM | POA: Diagnosis not present

## 2022-06-04 DIAGNOSIS — I82431 Acute embolism and thrombosis of right popliteal vein: Secondary | ICD-10-CM | POA: Diagnosis present

## 2022-06-04 DIAGNOSIS — I5043 Acute on chronic combined systolic (congestive) and diastolic (congestive) heart failure: Secondary | ICD-10-CM | POA: Diagnosis present

## 2022-06-04 DIAGNOSIS — J9811 Atelectasis: Secondary | ICD-10-CM | POA: Diagnosis present

## 2022-06-04 DIAGNOSIS — I5082 Biventricular heart failure: Secondary | ICD-10-CM | POA: Diagnosis present

## 2022-06-04 DIAGNOSIS — R188 Other ascites: Secondary | ICD-10-CM | POA: Diagnosis present

## 2022-06-04 DIAGNOSIS — I82442 Acute embolism and thrombosis of left tibial vein: Secondary | ICD-10-CM | POA: Diagnosis present

## 2022-06-04 DIAGNOSIS — F17290 Nicotine dependence, other tobacco product, uncomplicated: Secondary | ICD-10-CM | POA: Diagnosis present

## 2022-06-04 DIAGNOSIS — I5022 Chronic systolic (congestive) heart failure: Secondary | ICD-10-CM | POA: Diagnosis not present

## 2022-06-04 DIAGNOSIS — E785 Hyperlipidemia, unspecified: Secondary | ICD-10-CM | POA: Diagnosis present

## 2022-06-04 DIAGNOSIS — I82412 Acute embolism and thrombosis of left femoral vein: Secondary | ICD-10-CM | POA: Diagnosis present

## 2022-06-04 DIAGNOSIS — I2699 Other pulmonary embolism without acute cor pulmonale: Secondary | ICD-10-CM | POA: Diagnosis present

## 2022-06-04 DIAGNOSIS — B952 Enterococcus as the cause of diseases classified elsewhere: Secondary | ICD-10-CM | POA: Diagnosis present

## 2022-06-04 DIAGNOSIS — R52 Pain, unspecified: Secondary | ICD-10-CM | POA: Diagnosis not present

## 2022-06-04 DIAGNOSIS — I2510 Atherosclerotic heart disease of native coronary artery without angina pectoris: Secondary | ICD-10-CM | POA: Diagnosis present

## 2022-06-04 DIAGNOSIS — I110 Hypertensive heart disease with heart failure: Secondary | ICD-10-CM | POA: Diagnosis present

## 2022-06-04 DIAGNOSIS — E669 Obesity, unspecified: Secondary | ICD-10-CM | POA: Diagnosis present

## 2022-06-04 DIAGNOSIS — Z6831 Body mass index (BMI) 31.0-31.9, adult: Secondary | ICD-10-CM | POA: Diagnosis not present

## 2022-06-04 DIAGNOSIS — K760 Fatty (change of) liver, not elsewhere classified: Secondary | ICD-10-CM | POA: Diagnosis present

## 2022-06-04 DIAGNOSIS — N280 Ischemia and infarction of kidney: Secondary | ICD-10-CM | POA: Diagnosis not present

## 2022-06-04 DIAGNOSIS — E860 Dehydration: Secondary | ICD-10-CM | POA: Diagnosis present

## 2022-06-04 DIAGNOSIS — I255 Ischemic cardiomyopathy: Secondary | ICD-10-CM | POA: Diagnosis present

## 2022-06-04 LAB — URINALYSIS, ROUTINE W REFLEX MICROSCOPIC
Bacteria, UA: NONE SEEN
Bilirubin Urine: NEGATIVE
Glucose, UA: NEGATIVE mg/dL
Ketones, ur: NEGATIVE mg/dL
Leukocytes,Ua: NEGATIVE
Nitrite: NEGATIVE
Protein, ur: 100 mg/dL — AB
Specific Gravity, Urine: 1.014 (ref 1.005–1.030)
pH: 6 (ref 5.0–8.0)

## 2022-06-04 LAB — CBC
HCT: 46.1 % (ref 39.0–52.0)
Hemoglobin: 15.3 g/dL (ref 13.0–17.0)
MCH: 30.7 pg (ref 26.0–34.0)
MCHC: 33.2 g/dL (ref 30.0–36.0)
MCV: 92.6 fL (ref 80.0–100.0)
Platelets: 240 10*3/uL (ref 150–400)
RBC: 4.98 MIL/uL (ref 4.22–5.81)
RDW: 13 % (ref 11.5–15.5)
WBC: 12.1 10*3/uL — ABNORMAL HIGH (ref 4.0–10.5)
nRBC: 0 % (ref 0.0–0.2)

## 2022-06-04 LAB — BASIC METABOLIC PANEL
Anion gap: 11 (ref 5–15)
BUN: 10 mg/dL (ref 6–20)
CO2: 26 mmol/L (ref 22–32)
Calcium: 8.4 mg/dL — ABNORMAL LOW (ref 8.9–10.3)
Chloride: 101 mmol/L (ref 98–111)
Creatinine, Ser: 1.16 mg/dL (ref 0.61–1.24)
GFR, Estimated: >60 mL/min (ref >60)
Glucose, Bld: 107 mg/dL — ABNORMAL HIGH (ref 70–99)
Potassium: 4 mmol/L (ref 3.5–5.1)
Sodium: 138 mmol/L (ref 135–145)

## 2022-06-04 LAB — RESPIRATORY PANEL BY PCR

## 2022-06-04 LAB — COMPREHENSIVE METABOLIC PANEL
ALT: 31 U/L (ref 0–44)
AST: 29 U/L (ref 15–41)
Albumin: 3.7 g/dL (ref 3.5–5.0)
Alkaline Phosphatase: 59 U/L (ref 38–126)
Anion gap: 11 (ref 5–15)
BUN: 11 mg/dL (ref 6–20)
CO2: 24 mmol/L (ref 22–32)
Calcium: 8.6 mg/dL — ABNORMAL LOW (ref 8.9–10.3)
Chloride: 101 mmol/L (ref 98–111)
Creatinine, Ser: 1.09 mg/dL (ref 0.61–1.24)
GFR, Estimated: >60 mL/min (ref >60)
Glucose, Bld: 134 mg/dL — ABNORMAL HIGH (ref 70–99)
Potassium: 4.2 mmol/L (ref 3.5–5.1)
Sodium: 136 mmol/L (ref 135–145)
Total Bilirubin: 3.5 mg/dL — ABNORMAL HIGH (ref 0.3–1.2)
Total Protein: 7 g/dL (ref 6.5–8.1)

## 2022-06-04 LAB — MAGNESIUM
Magnesium: 2 mg/dL (ref 1.7–2.4)
Magnesium: 1.9 mg/dL (ref 1.7–2.4)

## 2022-06-04 LAB — URINALYSIS, W/ REFLEX TO CULTURE (INFECTION SUSPECTED)
Bilirubin Urine: NEGATIVE
Glucose, UA: >=500 mg/dL — AB
Ketones, ur: NEGATIVE mg/dL
Leukocytes,Ua: NEGATIVE
Nitrite: NEGATIVE
Protein, ur: 30 mg/dL — AB
Specific Gravity, Urine: 1.006 (ref 1.005–1.030)
pH: 7 (ref 5.0–8.0)

## 2022-06-04 LAB — TROPONIN I (HIGH SENSITIVITY): Troponin I (High Sensitivity): 44 ng/L — ABNORMAL HIGH (ref <18)

## 2022-06-04 LAB — ECHOCARDIOGRAM COMPLETE
AR max vel: 3.37 cm2
AV Area VTI: 3.42 cm2
AV Area mean vel: 3.37 cm2
AV Mean grad: 2 mmHg
AV Peak grad: 3 mmHg
Ao pk vel: 0.87 m/s
Area-P 1/2: 6.6 cm2
Est EF: <20
Height: 74 in
S' Lateral: 5.8 cm
Weight: 3840 oz

## 2022-06-04 LAB — LIPID PANEL
Cholesterol: 176 mg/dL (ref 0–200)
HDL: 27 mg/dL — ABNORMAL LOW (ref >40)
LDL Cholesterol: 135 mg/dL — ABNORMAL HIGH (ref 0–99)
Total CHOL/HDL Ratio: 6.5 RATIO
Triglycerides: 72 mg/dL (ref <150)
VLDL: 14 mg/dL (ref 0–40)

## 2022-06-04 LAB — PROCALCITONIN: Procalcitonin: <0.1 ng/mL

## 2022-06-04 LAB — PHOSPHORUS: Phosphorus: 3.6 mg/dL (ref 2.5–4.6)

## 2022-06-04 LAB — HEPARIN LEVEL (UNFRACTIONATED): Heparin Unfractionated: 0.11 IU/mL — ABNORMAL LOW (ref 0.30–0.70)

## 2022-06-04 LAB — BRAIN NATRIURETIC PEPTIDE: B Natriuretic Peptide: 745.9 pg/mL — ABNORMAL HIGH (ref 0.0–100.0)

## 2022-06-04 MED ORDER — MAGNESIUM HYDROXIDE 400 MG/5ML PO SUSP
30.0000 mL | Freq: Every day | ORAL | Status: DC | PRN
  Administered 2022-06-04 – 2022-06-08 (×3): 30 mL via ORAL
  Filled 2022-06-04 (×3): qty 30

## 2022-06-04 MED ORDER — CALCIUM GLUCONATE-NACL 1-0.675 GM/50ML-% IV SOLN
1.0000 g | Freq: Once | INTRAVENOUS | Status: AC
  Administered 2022-06-04: 1000 mg via INTRAVENOUS
  Filled 2022-06-04 (×2): qty 50

## 2022-06-04 MED ORDER — ASPIRIN 300 MG RE SUPP
300.0000 mg | RECTAL | Status: AC
  Filled 2022-06-04: qty 1

## 2022-06-04 MED ORDER — CARVEDILOL 6.25 MG PO TABS
9.3750 mg | ORAL_TABLET | Freq: Two times a day (BID) | ORAL | Status: DC
  Administered 2022-06-04 – 2022-06-08 (×9): 9.375 mg via ORAL
  Filled 2022-06-04 (×5): qty 1
  Filled 2022-06-04: qty 3
  Filled 2022-06-04 (×3): qty 1

## 2022-06-04 MED ORDER — ASPIRIN 81 MG PO TBEC
81.0000 mg | DELAYED_RELEASE_TABLET | Freq: Every day | ORAL | Status: DC
  Administered 2022-06-05 – 2022-06-08 (×4): 81 mg via ORAL
  Filled 2022-06-04 (×4): qty 1

## 2022-06-04 MED ORDER — ONDANSETRON HCL 4 MG/2ML IJ SOLN
4.0000 mg | Freq: Four times a day (QID) | INTRAMUSCULAR | Status: DC | PRN
  Administered 2022-06-04: 4 mg via INTRAVENOUS
  Filled 2022-06-04 (×2): qty 2

## 2022-06-04 MED ORDER — MORPHINE SULFATE (PF) 2 MG/ML IV SOLN
2.0000 mg | Freq: Once | INTRAVENOUS | Status: AC
  Administered 2022-06-04: 2 mg via INTRAVENOUS
  Filled 2022-06-04: qty 1

## 2022-06-04 MED ORDER — NITROGLYCERIN 0.4 MG SL SUBL: 0.4000 mg | SUBLINGUAL_TABLET | SUBLINGUAL | Status: DC | PRN

## 2022-06-04 MED ORDER — EZETIMIBE 10 MG PO TABS
10.0000 mg | ORAL_TABLET | Freq: Every day | ORAL | Status: DC
  Administered 2022-06-04 – 2022-06-08 (×5): 10 mg via ORAL
  Filled 2022-06-04 (×5): qty 1

## 2022-06-04 MED ORDER — DAPAGLIFLOZIN PROPANEDIOL 10 MG PO TABS
10.0000 mg | ORAL_TABLET | Freq: Every day | ORAL | Status: DC
  Administered 2022-06-04 – 2022-06-06 (×3): 10 mg via ORAL
  Filled 2022-06-04 (×3): qty 1

## 2022-06-04 MED ORDER — MORPHINE SULFATE (PF) 2 MG/ML IV SOLN
2.0000 mg | INTRAVENOUS | Status: DC | PRN
  Administered 2022-06-04 – 2022-06-06 (×7): 2 mg via INTRAVENOUS
  Filled 2022-06-04 (×7): qty 1

## 2022-06-04 MED ORDER — KETOROLAC TROMETHAMINE 15 MG/ML IJ SOLN
15.0000 mg | Freq: Once | INTRAMUSCULAR | Status: AC
  Administered 2022-06-04: 15 mg via INTRAVENOUS
  Filled 2022-06-04: qty 1

## 2022-06-04 MED ORDER — ASPIRIN 81 MG PO CHEW
324.0000 mg | CHEWABLE_TABLET | ORAL | Status: AC
  Administered 2022-06-04: 324 mg via ORAL
  Filled 2022-06-04: qty 4

## 2022-06-04 MED ORDER — FUROSEMIDE 10 MG/ML IJ SOLN: 20.0000 mg | Freq: Every day | INTRAMUSCULAR | Status: DC

## 2022-06-04 MED ORDER — SODIUM CHLORIDE 0.9 % IV SOLN
1.0000 g | INTRAVENOUS | Status: DC
  Administered 2022-06-04 – 2022-06-06 (×3): 1 g via INTRAVENOUS
  Filled 2022-06-04 (×3): qty 10

## 2022-06-04 MED ORDER — ACETAMINOPHEN 325 MG PO TABS
650.0000 mg | ORAL_TABLET | Freq: Four times a day (QID) | ORAL | Status: DC | PRN
  Administered 2022-06-04 – 2022-06-07 (×2): 650 mg via ORAL
  Filled 2022-06-04 (×2): qty 2

## 2022-06-04 MED ORDER — PERFLUTREN LIPID MICROSPHERE
1.0000 mL | INTRAVENOUS | Status: AC | PRN
  Administered 2022-06-04: 2 mL via INTRAVENOUS

## 2022-06-04 MED ORDER — SACUBITRIL-VALSARTAN 49-51 MG PO TABS
1.0000 | ORAL_TABLET | Freq: Two times a day (BID) | ORAL | Status: DC
  Administered 2022-06-04 – 2022-06-08 (×10): 1 via ORAL
  Filled 2022-06-04 (×10): qty 1

## 2022-06-04 MED ORDER — ONDANSETRON HCL 4 MG PO TABS: 4.0000 mg | ORAL_TABLET | Freq: Four times a day (QID) | ORAL | Status: DC | PRN

## 2022-06-04 MED ORDER — ATORVASTATIN CALCIUM 80 MG PO TABS
80.0000 mg | ORAL_TABLET | Freq: Every day | ORAL | Status: DC
  Administered 2022-06-04 – 2022-06-07 (×4): 80 mg via ORAL
  Filled 2022-06-04 (×4): qty 1

## 2022-06-04 MED ORDER — ONDANSETRON HCL 4 MG/2ML IJ SOLN: 4.0000 mg | Freq: Four times a day (QID) | INTRAMUSCULAR | Status: DC | PRN

## 2022-06-04 MED ORDER — ACETAMINOPHEN 650 MG RE SUPP: 650.0000 mg | Freq: Four times a day (QID) | RECTAL | Status: DC | PRN

## 2022-06-04 MED ORDER — DICYCLOMINE HCL 10 MG PO CAPS
10.0000 mg | ORAL_CAPSULE | Freq: Three times a day (TID) | ORAL | Status: DC | PRN
  Administered 2022-06-04: 10 mg via ORAL
  Filled 2022-06-04: qty 1

## 2022-06-04 MED ORDER — FUROSEMIDE 10 MG/ML IJ SOLN
20.0000 mg | Freq: Two times a day (BID) | INTRAMUSCULAR | Status: DC
  Administered 2022-06-04 (×2): 20 mg via INTRAVENOUS
  Filled 2022-06-04 (×2): qty 2

## 2022-06-04 MED ORDER — LEVALBUTEROL HCL 0.63 MG/3ML IN NEBU: 0.6300 mg | INHALATION_SOLUTION | Freq: Four times a day (QID) | RESPIRATORY_TRACT | Status: DC | PRN

## 2022-06-04 MED ORDER — SPIRONOLACTONE 25 MG PO TABS
25.0000 mg | ORAL_TABLET | Freq: Every day | ORAL | Status: DC
  Administered 2022-06-04 – 2022-06-08 (×5): 25 mg via ORAL
  Filled 2022-06-04 (×3): qty 1
  Filled 2022-06-04: qty 2
  Filled 2022-06-04: qty 1

## 2022-06-04 MED ORDER — HEPARIN BOLUS VIA INFUSION
2000.0000 [IU] | Freq: Once | INTRAVENOUS | Status: AC
  Administered 2022-06-04: 2000 [IU] via INTRAVENOUS
  Filled 2022-06-04: qty 2000

## 2022-06-04 MED ORDER — ENOXAPARIN SODIUM 40 MG/0.4ML IJ SOSY: 40.0000 mg | PREFILLED_SYRINGE | INTRAMUSCULAR | Status: DC

## 2022-06-04 MED ORDER — MAGNESIUM SULFATE 2 GM/50ML IV SOLN
2.0000 g | Freq: Once | INTRAVENOUS | Status: AC
  Administered 2022-06-04: 2 g via INTRAVENOUS
  Filled 2022-06-04: qty 50

## 2022-06-04 MED ORDER — DOCUSATE SODIUM 100 MG PO CAPS
100.0000 mg | ORAL_CAPSULE | Freq: Two times a day (BID) | ORAL | Status: DC | PRN
  Administered 2022-06-04: 100 mg via ORAL
  Filled 2022-06-04: qty 1

## 2022-06-04 MED ORDER — FUROSEMIDE 10 MG/ML IJ SOLN
40.0000 mg | Freq: Two times a day (BID) | INTRAMUSCULAR | Status: DC
  Administered 2022-06-04: 40 mg via INTRAVENOUS
  Filled 2022-06-04: qty 4

## 2022-06-04 NOTE — Consult Note (Signed)
Cardiology Consultation   Patient ID: REG UHLAND MRN: [id number]; DOB: [birth year]  Admit date: 06/03/2022 Date of Consult: 06/04/2022  PCP:  Ladell Pier, MD   [facility] Prov[facility] Cardiologist:  Glenetta Hew, MD        Patient Profile:   Reginald Fernandez is a 42 y.o. male with a hx of CAD s/p PCI HFrEF, HTN, morbid obesity, fatty liver, former smoker, prediabetes, marijuana use   who is being seen 06/04/2022 for the evaluation of NSTEMI at the request of Dr. Marcello Moores  History of Present Illness:   Reginald Fernandez is a 42 y.o. male with a hx of CAD s/p PCI HFrEF, HTN, morbid obesity, fatty liver, former smoker, prediabetes, marijuana use who is being seen 06/04/2022 for the evaluation of NSTEMI at the request of Dr. Marcello Moores. He came to the ED yesterday with abdominal pain and SOB. patient was diagnose with acute on chronic CHFref. Patient was treated with lasix iv with improvement per ED note and thus was discharged on increase home lasix from 20 mg to 40 mg. CT scan was done yesterday which showed mild ascites and pyelonephritis but UA was clean + no urinary symptoms. Today he returns to the ED saying he is very dehydrated, and feels weak along with intermittent chest pain. He was given fluids in the ED. Troponin was 220 increased from 30 yesterday. CXR showed mild pulmonary vascular congestion. Cardiology was consulted due to elevated troponin.    Past Medical History:  Diagnosis Date   CAD S/P percutaneous coronary angioplasty 11/20/2013   a. 09/2009: EKG with Inf STEMI - no obstructive CAD;  b. 11/2013 Inflat STEMI/PCI: LM nl, LAD 20p, D1 sm - diff dzs, D2 large - nl, LCX 95-99, OM1 100 (3.5x38 Promus Premier DES), RCA 95-44m, 80d (3.0x20 and 3.0x16 Promus Premier DES') - normal EF;  c. NSTEMI 5/18 - patent stents, otw minimal CAD.- EF by Echo 35-40%; d. NSTEMI 10/2018 - ? culprit - Small branch of  D2,Med Rx,.  EF by Echo ~25%   Cardiomyopathy, ischemic 07/2016   h/o Inf STEMI  11/2013 - EF was "Normal"; b) NSTEMI 07/2016 (NO Cultprit on Cath) - Echo EF 35-40% (Basal Ant-Lat HK, basal-mid Inferolateral & apical Akinesis); c) NSTEMI 10/2018 -felt to be occluded small branch of D2, Echo EF further decreased to 20-25%.    Essential hypertension    Hyperlipidemia with target LDL less than 100    Marijuana abuse    Migraine    Morbid obesity (HCC)    ST-segment elevation myocardial infarction (STEMI) of inferior wall ([place]) 11/20/2013    Past Surgical History:  Procedure Laterality Date   LEFT HEART CATH AND CORONARY ANGIOGRAPHY N/A 08/01/2016   Procedure: Left Heart Cath and Coronary Angiography;  Surgeon: Sherren Mocha, MD;  Location: [facility];;  Stable 2 V CAD - patent RCA & OM stents. Diffuse, non-obstructive CAD elsewhere. Moderately elevated LVEDP --> No culprit lesion  => restarted Plavix & titrate Med Rx.   LEFT HEART CATH AND CORONARY ANGIOGRAPHY N/A 10/24/2018   Procedure: LEFT HEART CATH AND CORONARY ANGIOGRAPHY;  Surgeon: Troy Sine, MD;  Location: [facility];  60% small D1, D2 has 3 branches 1 of which is small and appears to be 100%, LCx-OM widely patent with mild 55% stenosis in the jailed AV groove LCx. RCA has mild luminal irregularities 30% proximal with widely patent stents in the mid and distal RCA. (Culprit 100% small branch of  D2)   LEFT HEART CATH AND CORONARY ANGIOGRAPHY  09/2009   (Presumably in the setting of STEMI) mild RCA luminal Irregularities - MIld INf HK -- Med Rx; b)   LEFT HEART CATHETERIZATION WITH CORONARY ANGIOGRAM N/A 11/20/2013   Procedure: LEFT HEART CATHETERIZATION WITH CORONARY ANGIOGRAM;  Surgeon: Leonie Man, MD;  Location: [facility] CATH LAB;  Service: Cardiovascular;   Inf STEMI: OM1 100%, m-dRCA 95-99% thrombotic mRCA with tandem 80% -- PCI, LAD & Cx relatively normal.   PERCUTANEOUS CORONARY STENT INTERVENTION (PCI-S)  11/20/2013   a) PCI - OM1 Promus Premier DES 3.5 mm x 38 mm, m-d RCA Promus Premier DES  3.0 mm  x 20 mm & 3.0 mm x 16 mm   RIGHT/LEFT HEART CATH AND CORONARY ANGIOGRAPHY N/A 10/11/2021   Procedure: RIGHT/LEFT HEART CATH AND CORONARY ANGIOGRAPHY;  Surgeon: Larey Dresser, MD;  Location: [facility];  Service: Cardiovascular;  Laterality: N/A;   TRANSTHORACIC ECHOCARDIOGRAM  11/20/2013   LV EF 55-60%. No regional wall motion abnormality.  Normal valves    TRANSTHORACIC ECHOCARDIOGRAM  08/11/2016   (Non-STEMI-no culprit lesion): Moderately dilated LV.  Basal anterolateral HK with basal-mid inferolateral akinesis.  Mild-apical inferior akinesis.  EF 35 to 40%.  GRII DD.  Mild biatrial dilation.  Normal RV size and function. Mostly normal valves.   TRANSTHORACIC ECHOCARDIOGRAM  10/24/2018   (Non-STEMI-culprit lesion was 1 of 3 small 2 branches.)  EF 20-25%.  Mild mildly dilated LV.  GR 2 DD.  Mild biatrial dilation.  Normal PA pressures.       Inpatient Medications: Scheduled Meds:  [START ON 06/05/2022] aspirin EC  81 mg Oral Daily   atorvastatin  80 mg Oral q1800   carvedilol  9.375 mg Oral BID WC   dapagliflozin propanediol  10 mg Oral Daily   ezetimibe  10 mg Oral Daily   furosemide  40 mg Intravenous BID   sacubitril-valsartan  1 tablet Oral BID   spironolactone  25 mg Oral Daily   Continuous Infusions:  heparin 1,400 Units/hr (06/04/22 0020)   PRN Meds: acetaminophen **OR** acetaminophen, levalbuterol, nitroGLYCERIN, nitroGLYCERIN, ondansetron **OR** ondansetron (ZOFRAN) IV, ondansetron (ZOFRAN) IV  Allergies:    Allergies  Allergen Reactions   Penicillins Other (See Comments)    Childhood allergy Unknown reaction  Did it involve swelling of the face/tongue/throat, SOB, or low BP? No Did it involve sudden or severe rash/hives, skin peeling, or any reaction on the inside of your mouth or nose? No Did you need to seek medical attention at a hospital or doctor's office? No When did it last happen?      child If all above answers are "NO", may proceed with  cephalosporin use.    Social History:   Social History   Socioeconomic History   Marital status: Married    Spouse name: Not on file   Number of children: 2   Years of education: Not on file   Highest education level: Not on file  Occupational History    Employer: Geophysicist/field seismologist    Comment: [place]  Tobacco Use   Smoking status: Former    Types: Cigarettes    Quit date: 09/2016    Years since quitting: 5.7   Smokeless tobacco: Never  Vaping Use   Vaping Use: Every day  Substance and Sexual Activity   Alcohol use: No    Alcohol/week: 0.0 standard drinks of alcohol   Drug use: No   Sexual activity: Yes  Other Topics Concern   Not on file  Social History Narrative   Lives in [place] with wife.  Works with mentally challenged adults.   Social Determinants of Health   Financial Resource Strain: Not on file  Food Insecurity: Not on file  Transportation Needs: Not on file  Physical Activity: Not on file  Stress: Not on file  Social Connections: Not on file  Intimate Partner Violence: Not on file    Family History:   Family History  Problem Relation Age of Onset   Hypertension Mother    Heart attack Father 68       2 MIs by age 33 (first at 31)-- CABG   Hypertension Father    Heart failure Father    Hyperlipidemia Father    Heart attack Maternal Grandfather        60's   Heart attack Paternal Grandfather        60's   Heart attack Other      ROS:  Please see the history of present illness.  All other ROS reviewed and negative.     Physical Exam/Data:   Vitals:   06/03/22 2032 06/03/22 2036 06/04/22 0100 06/04/22 0115  BP: (!) 128/111     Pulse: (!) 104  90 95  Resp: 18  13 18   Temp: 98.2 F (36.8 C)     TempSrc: Oral     SpO2: 98%  99% 100%  Weight:  108.9 kg    Height:  6\' 2"  (1.88 m)      Intake/Output Summary (Last 24 hours) at 06/04/2022 0140 Last data filed at 06/04/2022 0024 Gross per 24 hour  Intake 500 ml  Output --   Net 500 ml      06/03/2022    8:36 PM 06/02/2022    8:40 PM 11/14/2021   11:09 AM  Last 3 Weights  Weight (lbs) 240 lb 243 lb 262 lb 12.8 oz  Weight (kg) 108.863 kg 110.224 kg 119.205 kg     Body mass index is 30.81 kg/m.  General:  Well nourished, well developed, in mild distress HEENT: normal Neck: no JVD Vascular: No carotid bruits; Distal pulses 2+ bilaterally Cardiac:  normal S1, S2; RRR; no murmur  Lungs:  clear to auscultation bilaterally, no wheezing, rhonchi or rales  Abd: soft, nontender, no hepatomegaly  Ext: no edema Musculoskeletal:  No deformities, BUE and BLE strength normal and equal Skin: warm and dry  Neuro:  CNs 2-12 intact, no focal abnormalities noted Psych:  Normal affect   EKG:  The EKG was personally reviewed and demonstrates no ST elevation  Relevant CV Studies:  Echo: EF < 20%, severe LV dilation, RV severe dysfunction.    R/LHC: Hemodynamics (mmHg) RA mean 4 RV 36/3 PA 43/22, mean 31 PCWP mean 16 LV 136/23 AO 135/86  Oxygen saturations: PA 72% AO 93%  Cardiac Output (Fick) 7.57  Cardiac Index (Fick) 3.12 PVR 2 WU     1st Diag lesion is 60% stenosed.   Prox RCA-1 lesion is 55% stenosed.   Mid RCA lesion is 40% stenosed.   Prox RCA-2 lesion is 30% stenosed.   Mid LAD lesion is 40% stenosed.   Prox Cx to Dist Cx lesion is 50% stenosed.   Non-stenotic Prox Cx lesion was previously treated.   Non-stenotic Prox RCA to Mid RCA lesion was previously treated.   Non-stenotic Dist RCA lesion was previously treated.   Non-stenotic 1st Mrg lesion was previously treated.  Laboratory Data:  High Sensitivity Troponin:   Recent Labs  Lab 06/02/22 2142 06/02/22 2348 06/03/22 2210 06/03/22 2332  TROPONINIHS 27* 30* 220* 44*     Chemistry Recent Labs  Lab 06/02/22 2047 06/03/22 2020 06/03/22 2210  NA 136 137  --   K 4.1 3.9  --   CL 104 98  --   CO2 24 27  --   GLUCOSE 139* 128*  --   BUN 17 11  --   CREATININE 1.03 1.24  --    CALCIUM 8.3* 8.6*  --   MG  --   --  1.9  GFRNONAA >60 >60  --   ANIONGAP 8 12  --     Recent Labs  Lab 06/02/22 2047  PROT 6.9  ALBUMIN 3.5  AST 25  ALT 27  ALKPHOS 59  BILITOT 2.1*   Lipids No results for input(s): "CHOL", "TRIG", "HDL", "LABVLDL", "LDLCALC", "CHOLHDL" in the last 168 hours.  Hematology Recent Labs  Lab 06/02/22 2047 06/03/22 2020  WBC 10.2 11.1*  RBC 4.68 4.88  HGB 14.3 15.1  HCT 42.5 44.5  MCV 90.8 91.2  MCH 30.6 30.9  MCHC 33.6 33.9  RDW 12.9 12.9  PLT 236 228   Thyroid No results for input(s): "TSH", "FREET4" in the last 168 hours.  BNP Recent Labs  Lab 06/02/22 2047 06/03/22 2332  BNP 1,359.4* 745.9*    DDimer No results for input(s): "DDIMER" in the last 168 hours.   Radiology/Studies:  DG Chest 2 View  Result Date: 06/03/2022 CLINICAL DATA:  Chest pain with body aches and fatigue. EXAM: CHEST - 2 VIEW COMPARISON:  June 02, 2022 FINDINGS: The cardiac silhouette is moderately enlarged and unchanged in size. There is mild stable perihilar prominence of the pulmonary vasculature. There is no evidence of an acute infiltrate, pleural effusion or pneumothorax. The visualized skeletal structures are unremarkable. IMPRESSION: Stable cardiomegaly with findings suggestive of mild pulmonary vascular congestion. Electronically Signed   By: Virgina Norfolk M.D.   On: 06/03/2022 23:36   CT ABDOMEN PELVIS W CONTRAST  Result Date: 06/03/2022 CLINICAL DATA:  Shortness of breath, epigastric abdominal pain. EXAM: CT ABDOMEN AND PELVIS WITH CONTRAST TECHNIQUE: Multidetector CT imaging of the abdomen and pelvis was performed using the standard protocol following bolus administration of intravenous contrast. RADIATION DOSE REDUCTION: This exam was performed according to the departmental dose-optimization program which includes automated exposure control, adjustment of the mA and/or kV according to patient size and/or use of iterative reconstruction technique.  CONTRAST:  124mL OMNIPAQUE IOHEXOL 300 MG/ML  SOLN COMPARISON:  10/09/2021. FINDINGS: Lower chest: The heart is enlarged and coronary artery calcifications are noted. There is a small right pleural effusion. Scattered ground-glass opacities are noted in the lower lobes bilaterally, greater on the right than on the left. Hepatobiliary: No focal abnormality in the liver. No cholelithiasis. There is mild gallbladder wall thickening, unchanged from the previous exam. No biliary ductal dilatation. Pancreas: Unremarkable. No pancreatic ductal dilatation or surrounding inflammatory changes. Spleen: Normal in size without focal abnormality. Adrenals/Urinary Tract: No adrenal nodule or mass. Scattered hypoenhancing regions are noted in the kidneys bilaterally and most pronounced in the lower pole of the left kidney. Perinephric fat stranding is noted on the left. No renal calculus or hydronephrosis. No obstructive uropathy bilaterally. The bladder is unremarkable. Stomach/Bowel: Stomach is within normal limits. Appendix appears normal. No evidence of bowel wall thickening, distention, or inflammatory changes. No free air or pneumatosis. Scattered diverticula are present along the colon  without evidence of diverticulitis. Vascular/Lymphatic: Aortic atherosclerosis. No enlarged abdominal or pelvic lymph nodes. Reproductive: Prostate gland is mildly enlarged. Other: A small amount of free fluid is noted in the right pericolic gutter and pelvis. Musculoskeletal: Degenerative changes are present in the thoracolumbar spine. There is sclerosis at the sacroiliac joint on the left, suggesting sacroiliitis. No acute or suspicious osseous abnormality. IMPRESSION: 1. Patchy hypoenhancement of the kidneys bilaterally, most pronounced in the lower pole of the left kidney suggesting pyelonephritis. No renal calculus or obstructive uropathy bilaterally. 2. Mildly enlarged prostate gland. 3. Small right pleural effusion with scattered  ground-glass opacities in the lower lobes bilaterally, possible edema or infiltrate. 4. Mild gallbladder wall thickening which is unchanged from the previous exam. No evidence of cholelithiasis. 5. Small ascites. 6. Aortic atherosclerosis and coronary artery calcifications. Electronically Signed   By: Brett Fairy M.D.   On: 06/03/2022 01:20   DG Chest Port 1 View  Result Date: 06/02/2022 CLINICAL DATA:  Shortness of breath. EXAM: PORTABLE CHEST 1 VIEW COMPARISON:  October 09, 2021 FINDINGS: There is stable moderate to marked severity enlargement of the cardiac silhouette. Mild diffusely increased lung markings are noted, slightly more prominent within the bilateral suprahilar and bilateral infrahilar regions. Mild prominence of the perihilar pulmonary vasculature is also seen. There is no evidence of focal consolidation, pleural effusion or pneumothorax. The visualized skeletal structures are unremarkable. IMPRESSION: 1. Stable cardiomegaly with mild pulmonary vascular congestion. Electronically Signed   By: Virgina Norfolk M.D.   On: 06/02/2022 22:19     Assessment and Plan:   # CAD s/p multiple stents # HFrEF # NSTEMI  -Patient has had RCA and LCX stents in 2015 -Last year: R/LHC demonstrated diffuse CAD but no severe stenosis to explain worsening of his EF, mildly elevated PCWP w/ pulmonary venous hypertension, normal RA pressure and preserved CO.  -Now vague symptoms with elevated troponin -Echo in AM -If CP persists or troponin continues to uptrend will consider LHC in AM -NPO midnight -Heparin IV per pharmacy -Telemetry -Aspirin and statin and zetia -Will hold off on IV lasix for tonight- will reassess in AM -Continue Coreg and Entresto for GDMT -Can hold SGLT-2 while inpt and feeling dizzy.  -Trend Cr   Signed, Jaci Lazier, MD  06/04/2022 1:40 AM

## 2022-06-04 NOTE — Progress Notes (Signed)
Pt received some relief of pain with ordered morphine.  After taking pain medication and milk of magnesia.  Pt vomited a large amount of liquid and stated it had an orange color to it.  Pt declined zofran.  EKG done.  After EKG, pt ambulated to bathroom and coughed up a small amount of bright red blood approx quarter size in toilet.  MD notified of these symptoms as well.  Awaiting repeat CT scan.

## 2022-06-04 NOTE — Progress Notes (Signed)
Pt reports LLQ abdominal pain 8/10 radiating to back.  Abdomen soft and tender to palpation on left lower quadrant.  Hyperactive bowel sounds to LLQ.  States last normal bm 3 days ago, had small bm this pm that required straining that produced 3 small hard pellets.  MD notified, awaiting further orders.

## 2022-06-04 NOTE — Progress Notes (Signed)
Pt currently lying in bed reporting pain 5/10.  No further vomiting.  Called CT to check on status of when patient can come down for testing.  No answer.  Will continue to monitor pt closely.

## 2022-06-04 NOTE — Progress Notes (Signed)
[place] for heparin Indication: chest pain/ACS  Allergies  Allergen Reactions   Penicillins Other (See Comments)    Childhood allergy Unknown reaction  Did it involve swelling of the face/tongue/throat, SOB, or low BP? No Did it involve sudden or severe rash/hives, skin peeling, or any reaction on the inside of your mouth or nose? No Did you need to seek medical attention at a hospital or doctor's office? No When did it last happen?      child If all above answers are "NO", may proceed with cephalosporin use.    Patient Measurements: Height: 6\' 2"  (188 cm) Weight: 108.9 kg (240 lb) IBW/kg (Calculated) : 82.2 Heparin Dosing Weight: 105kg  Vital Signs: Temp: 98.1 F (36.7 C) (03/18 0727) Temp Source: Oral (03/18 0727) BP: 121/94 (03/18 0645) Pulse Rate: 82 (03/18 0645)  Labs: Recent Labs    06/02/22 2047 06/02/22 2142 06/02/22 2348 06/03/22 2020 06/03/22 2210 06/03/22 2332 06/04/22 0149 06/04/22 0420 06/04/22 0611  HGB 14.3  --   --  15.1  --   --  15.3  --   --   HCT 42.5  --   --  44.5  --   --  46.1  --   --   PLT 236  --   --  228  --   --  240  --   --   HEPARINUNFRC  --   --   --   --   --   --   --   --  0.11*  CREATININE 1.03  --   --  1.24  --   --  1.09 1.16  --   TROPONINIHS  --    < > 30*  --  220* 44*  --   --   --    < > = values in this interval not displayed.     Estimated Creatinine Clearance: 110.1 mL/min (by C-G formula based on SCr of 1.16 mg/dL).   Medical History: Past Medical History:  Diagnosis Date   CAD S/P percutaneous coronary angioplasty 11/20/2013   a. 09/2009: EKG with Inf STEMI - no obstructive CAD;  b. 11/2013 Inflat STEMI/PCI: LM nl, LAD 20p, D1 sm - diff dzs, D2 large - nl, LCX 95-99, OM1 100 (3.5x38 Promus Premier DES), RCA 95-55m, 80d (3.0x20 and 3.0x16 Promus Premier DES') - normal EF;  c. NSTEMI 5/18 - patent stents, otw minimal CAD.- EF by Echo 35-40%; d. NSTEMI 10/2018 - ? culprit -  Small branch of  D2,Med Rx,.  EF by Echo ~25%   Cardiomyopathy, ischemic 07/2016   h/o Inf STEMI 11/2013 - EF was "Normal"; b) NSTEMI 07/2016 (NO Cultprit on Cath) - Echo EF 35-40% (Basal Ant-Lat HK, basal-mid Inferolateral & apical Akinesis); c) NSTEMI 10/2018 -felt to be occluded small branch of D2, Echo EF further decreased to 20-25%.    Essential hypertension    Hyperlipidemia with target LDL less than 100    Marijuana abuse    Migraine    Morbid obesity (HCC)    ST-segment elevation myocardial infarction (STEMI) of inferior wall ([place]) 11/20/2013    Assessment: 42yo male with known cardiac PMH c/o generalized fatigue, weakness, and "waves of CP", initial troponin mildly elevated and now rising (27>30>220) >> to begin heparin.  Initial heparin level subtherapeutic at 0.11, CBC stable. No infusion issues or bleeding noted by RN.  Goal of Therapy:  Heparin level 0.3-0.7 units/ml Monitor platelets by anticoagulation protocol: Yes  Plan:  Heparin 2000 unit bolus, then increase to 1700 units/h Recheck heparin level in 6h  Arrie Senate, PharmD, [place], [facility] Clinical Pharmacist [phone] Please check AMION for all [place] numbers 06/04/2022

## 2022-06-04 NOTE — Progress Notes (Addendum)
Patient seen and examined, admitted earlier this morning by Dr. Marcello Moores, please see H&P for details, briefly Reginald Fernandez is a 41/M with Severe BiV Failure, EF<20%, ischemic cardiomyopathy, CAD with multiple MI, DES, obesity, fatty liver disease, former smoker was seen in the ED on 3/16 with shortness of breath and abdominal pain, treated with a dose of diuretics and discharged home on higher dose Lasix, subsequently  back in the ER, vital stable, troponin up to 220, creatinine 1.2, BNP 745 chest x-ray with cardiomegaly, mild pulmonary vascular congestion.  CT chest with small pleural effusion, groundglass opacities in lower lungs concerning for edema, ?pyelonephritis  Acute on chronic biventricular failure -Echo 7/23 noted EF less than 20%, severely reduced RV, LHC with diffused CAD in 7/23 -poor historian, CT chest suggestive of Pulm edema, small effusion, ascites -Follow-up repeat echo -Continue Coreg, Entresto, Aldactone, Farxiga -Continue IV Lasix today -Cards following   Hx CAD s/p MI s/p multiple DES Demand ischemia, mildly elevated troponin -Do not suspect ACS, discontinue heparin, continue aspirin, Zetia, Lipitor -Cards following, cath 7/23 noted patent stents with moderate disease in other vessels -Continue aspirin, Zetia, atorvastatin   Flank pain  -ct with ? Pyelo, no clear symptoms of UTI, UA is not impressive, has mild leukocytosis, check procalcitonin, follow-up urine cultures -empiric ceftriaxone for now   Hypertension  -meds as above   HLD -continue statin    Fatty liver  -no changes in liver on CT   Reginald Polite, MD

## 2022-06-04 NOTE — Progress Notes (Addendum)
CARDIOLOGY ROUNDING PROGRESS NOTE  Patient Name: Reginald Fernandez Date of Encounter: 06/04/2022  [facility] Cardiologist: Glenetta Hew, MD  Patient Profile     42 y.o. male with a hx of HTN, combined systolic and diastolic HF, EF [id number] with global hypokinesis, CAD s/p multiple MI with several DES, Obesity, fatty liver, former smoker who presented to the ED 3/16 initially for SOB and abdominal pain discharged for acute on chronic CHF exacerbation with diuresis plan returned to ED on 3/17 for dehydration concerns. Cardiology following given cardiac history and nonspecific symptoms  Principal Problem:   NSTEMI (non-ST elevated myocardial infarction) ([place])   Subjective   O/N: Patient reports improvement in SOB and nausea since prior to returning to the ED for the second time. Denies chest pain, palpitations, shortness of breath, difficulty with ambulation. Denies abdominal pain. Continues to endorse mild diffuse back pain since laying in the ED bed. Pain is non radiating. Patient denies LUTS.  He is followed by Dr. Ellyn Hack but admits that it has been difficult to present for folow ups secondary to insurance coverage/financial constraints  ROS:  All other ROS reviewed and negative. Pertinent positives noted in the HPI.     Inpatient Medications  Scheduled Meds:  [START ON 06/05/2022] aspirin EC  81 mg Oral Daily   atorvastatin  80 mg Oral q1800   carvedilol  9.375 mg Oral BID WC   dapagliflozin propanediol  10 mg Oral Daily   ezetimibe  10 mg Oral Daily   [START ON 06/05/2022] furosemide  20 mg Intravenous Daily   sacubitril-valsartan  1 tablet Oral BID   spironolactone  25 mg Oral Daily   Continuous Infusions:  heparin 1,700 Units/hr (06/04/22 0828)   magnesium sulfate bolus IVPB     PRN Meds: acetaminophen **OR** acetaminophen, levalbuterol, nitroGLYCERIN, nitroGLYCERIN, ondansetron **OR** ondansetron (ZOFRAN) IV, ondansetron (ZOFRAN) IV   Vital Signs    Vitals:    06/04/22 0645 06/04/22 0727 06/04/22 0950 06/04/22 0951  BP: (!) 121/94  109/89   Pulse: 82  88   Resp: 18  19   Temp:  98.1 F (36.7 C)  98.2 F (36.8 C)  TempSrc:  Oral  Oral  SpO2: 93%  97%   Weight:      Height:        Intake/Output Summary (Last 24 hours) at 06/04/2022 1005 Last data filed at 06/04/2022 0520 Gross per 24 hour  Intake 550 ml  Output 2300 ml  Net -1750 ml      06/03/2022    8:36 PM 06/02/2022    8:40 PM 11/14/2021   11:09 AM  Last 3 Weights  Weight (lbs) 240 lb 243 lb 262 lb 12.8 oz  Weight (kg) 108.863 kg 110.224 kg 119.205 kg     Physical Exam   General: young, obese male in NAD Head: Atraumatic, normal size  Eyes: PEERLA, EOMI  Neck: Supple Endocrine: No thryomegaly Cardiac: Normal S1, S2; RRR; no murmurs, rubs, or gallops Lungs: Clear to auscultation bilaterally, no wheezing, rhonchi or rales  Abd: Soft, nontender, no hepatomegaly  Ext: No edema, pulses 2+ Musculoskeletal: No deformities, non tender paraspinal muscles, no CVA tenderness Skin: Warm and dry, no rashes   Neuro: Alert and oriented to person, place, time, and situation, no focal deficits  Psych: Normal mood and affect   ECG  The most recent ECG shows NSR with bi-atrial enlargement, poor R wave progression in V4 to V5, and PVC, which I personally reviewed.  Labs    High Sensitivity Troponin:   Recent Labs  Lab 06/02/22 2142 06/02/22 2348 06/03/22 2210 06/03/22 2332  TROPONINIHS 27* 30* 220* 44*     Chemistry Recent Labs  Lab 06/02/22 2047 06/03/22 2020 06/03/22 2210 06/04/22 0149 06/04/22 0420  NA 136 137  --  136 138  K 4.1 3.9  --  4.2 4.0  CL 104 98  --  101 101  CO2 24 27  --  24 26  GLUCOSE 139* 128*  --  134* 107*  BUN 17 11  --  11 10  CREATININE 1.03 1.24  --  1.09 1.16  CALCIUM 8.3* 8.6*  --  8.6* 8.4*  MG  --   --  1.9 2.0 1.9  PROT 6.9  --   --  7.0  --   ALBUMIN 3.5  --   --  3.7  --   AST 25  --   --  29  --   ALT 27  --   --  31  --   ALKPHOS  59  --   --  59  --   BILITOT 2.1*  --   --  3.5*  --   GFRNONAA >60 >60  --  >60 >60  ANIONGAP 8 12  --  11 11    Lipids  Recent Labs  Lab 06/04/22 0149  CHOL 176  TRIG 72  HDL 27*  LDLCALC 135*  CHOLHDL 6.5    Hematology Recent Labs  Lab 06/02/22 2047 06/03/22 2020 06/04/22 0149  WBC 10.2 11.1* 12.1*  RBC 4.68 4.88 4.98  HGB 14.3 15.1 15.3  HCT 42.5 44.5 46.1  MCV 90.8 91.2 92.6  MCH 30.6 30.9 30.7  MCHC 33.6 33.9 33.2  RDW 12.9 12.9 13.0  PLT 236 228 240   Thyroid No results for input(s): "TSH", "FREET4" in the last 168 hours.  BNP Recent Labs  Lab 06/02/22 2047 06/03/22 2332  BNP 1,359.4* 745.9*    DDimer No results for input(s): "DDIMER" in the last 168 hours.   Radiology    DG Chest 2 View  Result Date: 06/03/2022 CLINICAL DATA:  Chest pain with body aches and fatigue. EXAM: CHEST - 2 VIEW COMPARISON:  June 02, 2022 FINDINGS: The cardiac silhouette is moderately enlarged and unchanged in size. There is mild stable perihilar prominence of the pulmonary vasculature. There is no evidence of an acute infiltrate, pleural effusion or pneumothorax. The visualized skeletal structures are unremarkable. IMPRESSION: Stable cardiomegaly with findings suggestive of mild pulmonary vascular congestion. Electronically Signed   By: Virgina Norfolk M.D.   On: 06/03/2022 23:36   CT ABDOMEN PELVIS W CONTRAST  Result Date: 06/03/2022 CLINICAL DATA:  Shortness of breath, epigastric abdominal pain. EXAM: CT ABDOMEN AND PELVIS WITH CONTRAST TECHNIQUE: Multidetector CT imaging of the abdomen and pelvis was performed using the standard protocol following bolus administration of intravenous contrast. RADIATION DOSE REDUCTION: This exam was performed according to the departmental dose-optimization program which includes automated exposure control, adjustment of the mA and/or kV according to patient size and/or use of iterative reconstruction technique. CONTRAST:  169mL OMNIPAQUE IOHEXOL  300 MG/ML  SOLN COMPARISON:  10/09/2021. FINDINGS: Lower chest: The heart is enlarged and coronary artery calcifications are noted. There is a small right pleural effusion. Scattered ground-glass opacities are noted in the lower lobes bilaterally, greater on the right than on the left. Hepatobiliary: No focal abnormality in the liver. No cholelithiasis. There is mild gallbladder wall thickening, unchanged  from the previous exam. No biliary ductal dilatation. Pancreas: Unremarkable. No pancreatic ductal dilatation or surrounding inflammatory changes. Spleen: Normal in size without focal abnormality. Adrenals/Urinary Tract: No adrenal nodule or mass. Scattered hypoenhancing regions are noted in the kidneys bilaterally and most pronounced in the lower pole of the left kidney. Perinephric fat stranding is noted on the left. No renal calculus or hydronephrosis. No obstructive uropathy bilaterally. The bladder is unremarkable. Stomach/Bowel: Stomach is within normal limits. Appendix appears normal. No evidence of bowel wall thickening, distention, or inflammatory changes. No free air or pneumatosis. Scattered diverticula are present along the colon without evidence of diverticulitis. Vascular/Lymphatic: Aortic atherosclerosis. No enlarged abdominal or pelvic lymph nodes. Reproductive: Prostate gland is mildly enlarged. Other: A small amount of free fluid is noted in the right pericolic gutter and pelvis. Musculoskeletal: Degenerative changes are present in the thoracolumbar spine. There is sclerosis at the sacroiliac joint on the left, suggesting sacroiliitis. No acute or suspicious osseous abnormality. IMPRESSION: 1. Patchy hypoenhancement of the kidneys bilaterally, most pronounced in the lower pole of the left kidney suggesting pyelonephritis. No renal calculus or obstructive uropathy bilaterally. 2. Mildly enlarged prostate gland. 3. Small right pleural effusion with scattered ground-glass opacities in the lower lobes  bilaterally, possible edema or infiltrate. 4. Mild gallbladder wall thickening which is unchanged from the previous exam. No evidence of cholelithiasis. 5. Small ascites. 6. Aortic atherosclerosis and coronary artery calcifications. Electronically Signed   By: Brett Fairy M.D.   On: 06/03/2022 01:20   DG Chest Port 1 View  Result Date: 06/02/2022 CLINICAL DATA:  Shortness of breath. EXAM: PORTABLE CHEST 1 VIEW COMPARISON:  October 09, 2021 FINDINGS: There is stable moderate to marked severity enlargement of the cardiac silhouette. Mild diffusely increased lung markings are noted, slightly more prominent within the bilateral suprahilar and bilateral infrahilar regions. Mild prominence of the perihilar pulmonary vasculature is also seen. There is no evidence of focal consolidation, pleural effusion or pneumothorax. The visualized skeletal structures are unremarkable. IMPRESSION: 1. Stable cardiomegaly with mild pulmonary vascular congestion. Electronically Signed   By: Virgina Norfolk M.D.   On: 06/02/2022 22:19    Cardiac Studies - Summarized  10/11/2021 LHC with Dr. Aundra Dubin 1st Diag lesion is 60% stenosed.   Prox RCA-1 lesion is 55% stenosed.   Mid RCA lesion is 40% stenosed.   Prox RCA-2 lesion is 30% stenosed.   Mid LAD lesion is 40% stenosed.   Prox Cx to Dist Cx lesion is 50% stenosed.   Non-stenotic Prox Cx lesion was previously treated.   Non-stenotic Prox RCA to Mid RCA lesion was previously treated.   Non-stenotic Dist RCA lesion was previously treated.   Non-stenotic 1st Mrg lesion was previously treated.   1. Mildly elevated PCWP with pulmonary venous hypertension.  2. Normal RA pressure.  3. Preserved cardiac output.  4. Diffuse CAD but no severe stenoses that would explain worsening of EF.    10/10/2021 2D ECHO IMPRESSIONS   1. Left ventricular ejection fraction, by estimation, is <20%. The left  ventricle has severely decreased function. The left ventricle demonstrates  global  hypokinesis. The left ventricular internal cavity size was severely  dilated. There is mild left  ventricular hypertrophy. Left ventricular diastolic parameters are  indeterminate.   2. Right ventricular systolic function is severely reduced. The right  ventricular size is normal.   3. Left atrial size was mildly dilated.   4. Right atrial size was mild to moderately dilated.   5. The  mitral valve is normal in structure. Mild mitral valve  regurgitation.   6. Tricuspid valve regurgitation is mild to moderate.   7. The aortic valve is tricuspid. Aortic valve regurgitation is not  visualized.   10/24/2018 LHC with Dr. Claiborne Billings Prox RCA-1 lesion is 30% stenosed. Prox RCA-2 lesion is 30% stenosed. Previously placed Prox RCA to Mid RCA stent (unknown type) is widely patent. Previously placed Dist RCA stent (unknown type) is widely patent. Previously placed 1st Mrg stent (unknown type) is widely patent. Previously placed Prox Cx stent (unknown type) is widely patent. Prox Cx to Mid Cx lesion is 55% stenosed. 1st Diag lesion is 60% stenosed.  Multivessel CAD with mild luminal irregularity of the LAD and evidence for 60% narrowing in a very small first diagonal vessel.  The second diagonal vessel bifurcates in its mid segment and there appears to be total occlusion of the inferior limb after the bifurcation.  (This was not able to be depicted on the diagram); the stent in the circumflex vessel which extends into the OM-1 vessel was widely patent and there is ostial pinching of the AV groove circumflex with narrowing of 50 to 60%; the RCA has luminal irregularity with 30% proximal and proximal to mid stenoses.  The mid RCA stent and distal RCA stent is widely patent.     Assessment & Plan   CAD s/p LCX stents in 2015 NSTEMI concern Concern for NSTEMI for Troponin 220, placed on Heparin drip and telemetry No chest pain today. Troponin 220->44, not consistent with ACS pattern. Elevation in troponin  likely in the setting of acute on chronic HF exacerbation. Do not favor LHC at this time. Patient is to continue on current medical therapy and to follow up with cardiology outpatient  -Discontinue heparin drip -Continue ASA 81 mg and Zetia 10 mg daily -Continue Lipitor 80 mg -Nitroglycerin PRN  Acute on chronic combined heart failure 09/2021 echo with EF <20% and global hypokinesis Patient with poor follow up and no repeat TTE since BNP elevation on admission with SOB now s/p diuresis and euvolemic on exam Vital signs remain stable -Repeat TTE today  Will need ICD consideration if EF remains suboptimal despite medical therapy -Continue on Coreg 9.735 mg BID, Entresto 43-51 mg daily, Aldactone 25 mg daily, Farxiga 10 mg dialy -Continue furosemide 20 mg daily Consider additional 20 mg daily for increase in wight more than 3lb daily -Patient to follow up in HF clinic    Romana Juniper, MD [facility] Program 06/04/2022, 10:05 AM  For questions or updates, please contact [place] Please consult [email or web address] for contact info under       I have examined the patient and reviewed assessment and plan and discussed with patient.  Agree with above as stated.    Patient reports that he came in due to symptoms of what he thought was dehydration.  He had been diuresed at a different [facility] a few days ago and felt that it was too much.  He was sent home on a higher dose of home furosemide and then began feeling weak.  He did not feel any symptoms like he did prior to his stents that were placed in the past.  Of note, cath in July 2023 showed patent stents with moderate disease in his other vessels.  His follow-up has not been ideal.  He had some insurance issues.  He was to follow-up with Dr. Aundra Dubin in the heart failure clinic at one  point but this did not happen.  Need to optimize medical therapy and ensure compliance as well.  Will try to arrange heart  failure follow-up prior to discharge.  Larae Grooms

## 2022-06-04 NOTE — ED Notes (Signed)
ED TO INPATIENT HANDOFF REPORT  ED Nurse Name and Phone #: Eldrige Pitkin  [phone]  S Name/Age/Gender Reginald Fernandez 42 y.o. male Room/Bed: 001C/001C  Code Status   Code Status: Full Code  Home/SNF/Other Home Patient oriented to: self, place, time, and situation Is this baseline? Yes   Triage Complete: Triage complete  Chief Complaint NSTEMI (non-ST elevated myocardial infarction) [facility]) [I21.4]  Triage Note Pt arrived to triage complaining of generalized fatigue. Pt states that he was seen yesterday and given lasix and then took extra at home today as advised at discharge. Pt states that he thinks he is now deydrated.  Complaining of generalized weakness and pain all over his body. States that his kidneys hurts and he is having waves of chest pain   Hx of MI and 3 stents.    Allergies Allergies  Allergen Reactions   Penicillins Other (See Comments)    Childhood allergy Unknown reaction  Did it involve swelling of the face/tongue/throat, SOB, or low BP? No Did it involve sudden or severe rash/hives, skin peeling, or any reaction on the inside of your mouth or nose? No Did you need to seek medical attention at a hospital or doctor's office? No When did it last happen?      child If all above answers are "NO", may proceed with cephalosporin use.    Level of Care/Admitting Diagnosis ED Disposition     ED Disposition  Admit   Condition  --   [place]: [place] [[id number]]  Level of Care: Progressive [102]  Admit to Progressive based on following criteria: CARDIOVASCULAR & THORACIC of moderate stability with acute coronary syndrome symptoms/low risk myocardial infarction/hypertensive urgency/arrhythmias/heart failure potentially compromising stability and stable post cardiovascular intervention patients.  May admit patient to [facility] or [facility] if equivalent level of care is available:: Yes  Covid Evaluation: Symptomatic Person Under  Investigation (PUI) or recent exposure (last 10 days) *Testing Required*  Diagnosis: NSTEMI (non-ST elevated myocardial infarction) [facility]) [id number]  Admitting Physician: Clance Boll [id number]  Attending Physician: Clance Boll [id number]  Certification:: I certify this patient will need inpatient services for at least 2 midnights  Estimated Length of Stay: 3          B Medical/Surgery History Past Medical History:  Diagnosis Date   CAD S/P percutaneous coronary angioplasty 11/20/2013   a. 09/2009: EKG with Inf STEMI - no obstructive CAD;  b. 11/2013 Inflat STEMI/PCI: LM nl, LAD 20p, D1 sm - diff dzs, D2 large - nl, LCX 95-99, OM1 100 (3.5x38 Promus Premier DES), RCA 95-12m, 80d (3.0x20 and 3.0x16 Promus Premier DES') - normal EF;  c. NSTEMI 5/18 - patent stents, otw minimal CAD.- EF by Echo 35-40%; d. NSTEMI 10/2018 - ? culprit - Small branch of  D2,Med Rx,.  EF by Echo ~25%   Cardiomyopathy, ischemic 07/2016   h/o Inf STEMI 11/2013 - EF was "Normal"; b) NSTEMI 07/2016 (NO Cultprit on Cath) - Echo EF 35-40% (Basal Ant-Lat HK, basal-mid Inferolateral & apical Akinesis); c) NSTEMI 10/2018 -felt to be occluded small branch of D2, Echo EF further decreased to 20-25%.    Essential hypertension    Hyperlipidemia with target LDL less than 100    Marijuana abuse    Migraine    Morbid obesity (HCC)    ST-segment elevation myocardial infarction (STEMI) of inferior wall ([place]) 11/20/2013   Past Surgical History:  Procedure Laterality Date   LEFT HEART CATH AND CORONARY ANGIOGRAPHY  N/A 08/01/2016   Procedure: Left Heart Cath and Coronary Angiography;  Surgeon: Sherren Mocha, MD;  Location: [place] CV [facility];;  Stable 2 V CAD - patent RCA & OM stents. Diffuse, non-obstructive CAD elsewhere. Moderately elevated LVEDP --> No culprit lesion  => restarted Plavix & titrate Med Rx.   LEFT HEART CATH AND CORONARY ANGIOGRAPHY N/A 10/24/2018   Procedure: LEFT HEART CATH AND CORONARY ANGIOGRAPHY;   Surgeon: Troy Sine, MD;  Location: [place] CV [facility];  60% small D1, D2 has 3 branches 1 of which is small and appears to be 100%, LCx-OM widely patent with mild 55% stenosis in the jailed AV groove LCx. RCA has mild luminal irregularities 30% proximal with widely patent stents in the mid and distal RCA. (Culprit 100% small branch of D2)   LEFT HEART CATH AND CORONARY ANGIOGRAPHY  09/2009   (Presumably in the setting of STEMI) mild RCA luminal Irregularities - MIld INf HK -- Med Rx; b)   LEFT HEART CATHETERIZATION WITH CORONARY ANGIOGRAM N/A 11/20/2013   Procedure: LEFT HEART CATHETERIZATION WITH CORONARY ANGIOGRAM;  Surgeon: Leonie Man, MD;  Location: [facility] CATH LAB;  Service: Cardiovascular;   Inf STEMI: OM1 100%, m-dRCA 95-99% thrombotic mRCA with tandem 80% -- PCI, LAD & Cx relatively normal.   PERCUTANEOUS CORONARY STENT INTERVENTION (PCI-S)  11/20/2013   a) PCI - OM1 Promus Premier DES 3.5 mm x 38 mm, m-d RCA Promus Premier DES  3.0 mm x 20 mm & 3.0 mm x 16 mm   RIGHT/LEFT HEART CATH AND CORONARY ANGIOGRAPHY N/A 10/11/2021   Procedure: RIGHT/LEFT HEART CATH AND CORONARY ANGIOGRAPHY;  Surgeon: Larey Dresser, MD;  Location: [place] CV LAB;  Service: Cardiovascular;  Laterality: N/A;   TRANSTHORACIC ECHOCARDIOGRAM  11/20/2013   LV EF 55-60%. No regional wall motion abnormality.  Normal valves    TRANSTHORACIC ECHOCARDIOGRAM  08/11/2016   (Non-STEMI-no culprit lesion): Moderately dilated LV.  Basal anterolateral HK with basal-mid inferolateral akinesis.  Mild-apical inferior akinesis.  EF 35 to 40%.  GRII DD.  Mild biatrial dilation.  Normal RV size and function. Mostly normal valves.   TRANSTHORACIC ECHOCARDIOGRAM  10/24/2018   (Non-STEMI-culprit lesion was 1 of 3 small 2 branches.)  EF 20-25%.  Mild mildly dilated LV.  GR 2 DD.  Mild biatrial dilation.  Normal PA pressures.     A IV Location/Drains/Wounds Patient Lines/Drains/Airways Status     Active Line/Drains/Airways      Name Placement date Placement time Site Days   Peripheral IV 06/03/22 20 G Right Antecubital 06/03/22  2200  Antecubital  1   Peripheral IV 06/04/22 20 G Left Antecubital 06/04/22  0100  Antecubital  less than 1            Intake/Output Last 24 hours  Intake/Output Summary (Last 24 hours) at 06/04/2022 1201 Last data filed at 06/04/2022 1114 Gross per 24 hour  Intake 600 ml  Output 2300 ml  Net -1700 ml    Labs/Imaging Results for orders placed or performed during the hospital encounter of 06/03/22 (from the past 48 hour(s))  Basic metabolic panel     Status: Abnormal   Collection Time: 06/03/22  8:20 PM  Result Value Ref Range   Sodium 137 135 - 145 mmol/L   Potassium 3.9 3.5 - 5.1 mmol/L   Chloride 98 98 - 111 mmol/L   CO2 27 22 - 32 mmol/L   Glucose, Bld 128 (H) 70 - 99 mg/dL    Comment:  Glucose reference range applies only to samples taken after fasting for at least 8 hours.   BUN 11 6 - 20 mg/dL   Creatinine, Ser 1.24 0.61 - 1.24 mg/dL   Calcium 8.6 (L) 8.9 - 10.3 mg/dL   GFR, Estimated >60 >60 mL/min    Comment: (NOTE) Calculated using the CKD-EPI Creatinine Equation (2021)    Anion gap 12 5 - 15    Comment: Performed at [facility] [address], [place], [place] [address]  CBC     Status: Abnormal   Collection Time: 06/03/22  8:20 PM  Result Value Ref Range   WBC 11.1 (H) 4.0 - 10.5 K/uL   RBC 4.88 4.22 - 5.81 MIL/uL   Hemoglobin 15.1 13.0 - 17.0 g/dL   HCT 44.5 39.0 - 52.0 %   MCV 91.2 80.0 - 100.0 fL   MCH 30.9 26.0 - 34.0 pg   MCHC 33.9 30.0 - 36.0 g/dL   RDW 12.9 11.5 - 15.5 %   Platelets 228 150 - 400 K/uL   nRBC 0.0 0.0 - 0.2 %    Comment: Performed at [facility], [place] [address], [place], [place] [address]  CBG monitoring, ED     Status: Abnormal   Collection Time: 06/03/22  9:01 PM  Result Value Ref Range   Glucose-Capillary 126 (H) 70 - 99 mg/dL    Comment: Glucose reference range applies only to samples taken after fasting for  at least 8 hours.  Magnesium     Status: None   Collection Time: 06/03/22 10:10 PM  Result Value Ref Range   Magnesium 1.9 1.7 - 2.4 mg/dL    Comment: Performed at [facility] [address], [place], [place] [address]  Troponin I (High Sensitivity)     Status: Abnormal   Collection Time: 06/03/22 10:10 PM  Result Value Ref Range   Troponin I (High Sensitivity) 220 (HH) <18 ng/L    Comment: CRITICAL RESULT CALLED TO, READ BACK BY AND VERIFIED WITH SHELLON JOHNSON RN 06/03/22 2254 M KOROLESKI (NOTE) Elevated high sensitivity troponin I (hsTnI) values and significant  changes across serial measurements may suggest ACS but many other  chronic and acute conditions are known to elevate hsTnI results.  Refer to the "Links" section for chest pain algorithms and additional  guidance. Performed at [facility], [place] [address] [place], [place] [address]   Brain natriuretic peptide     Status: Abnormal   Collection Time: 06/03/22 11:32 PM  Result Value Ref Range   B Natriuretic Peptide 745.9 (H) 0.0 - 100.0 pg/mL    Comment: Performed at [facility] [address] [place], [place] [address]  Troponin I (High Sensitivity)     Status: Abnormal   Collection Time: 06/03/22 11:32 PM  Result Value Ref Range   Troponin I (High Sensitivity) 44 (H) <18 ng/L    Comment: [place] CHECK NOTED (NOTE) Elevated high sensitivity troponin I (hsTnI) values and significant  changes across serial measurements may suggest ACS but many other  chronic and acute conditions are known to elevate hsTnI results.  Refer to the "Links" section for chest pain algorithms and additional  guidance. Performed at [facility], [place] [address], [place], [place] [address]   Urinalysis, Routine w reflex microscopic -Urine, Clean Catch     Status: Abnormal   Collection Time: 06/04/22  1:46 AM  Result Value Ref Range   Color, Urine YELLOW YELLOW  APPearance CLEAR CLEAR   Specific Gravity, Urine  1.014 1.005 - 1.030   pH 6.0 5.0 - 8.0   Glucose, UA NEGATIVE NEGATIVE mg/dL   Hgb urine dipstick SMALL (A) NEGATIVE   Bilirubin Urine NEGATIVE NEGATIVE   Ketones, ur NEGATIVE NEGATIVE mg/dL   Protein, ur 100 (A) NEGATIVE mg/dL   Nitrite NEGATIVE NEGATIVE   Leukocytes,Ua NEGATIVE NEGATIVE   RBC / HPF 0-5 0 - 5 RBC/hpf   WBC, UA 0-5 0 - 5 WBC/hpf   Bacteria, UA NONE SEEN NONE SEEN   Squamous Epithelial / HPF 0-5 0 - 5 /HPF   Mucus PRESENT     Comment: Performed at [facility], [place] [address], [place], [place] [address]  Respiratory (~20 pathogens) panel by PCR     Status: None   Collection Time: 06/04/22  1:46 AM   Specimen: Nasopharyngeal Swab; Respiratory  Result Value Ref Range   Adenovirus NOT DETECTED NOT DETECTED   Coronavirus 229E NOT DETECTED NOT DETECTED    Comment: (NOTE) The Coronavirus on the Respiratory Panel, DOES NOT test for the novel  Coronavirus (2019 nCoV)    Coronavirus HKU1 NOT DETECTED NOT DETECTED   Coronavirus NL63 NOT DETECTED NOT DETECTED   Coronavirus OC43 NOT DETECTED NOT DETECTED   Metapneumovirus NOT DETECTED NOT DETECTED   Rhinovirus / Enterovirus NOT DETECTED NOT DETECTED   Influenza A NOT DETECTED NOT DETECTED   Influenza B NOT DETECTED NOT DETECTED   Parainfluenza Virus 1 NOT DETECTED NOT DETECTED   Parainfluenza Virus 2 NOT DETECTED NOT DETECTED   Parainfluenza Virus 3 NOT DETECTED NOT DETECTED   Parainfluenza Virus 4 NOT DETECTED NOT DETECTED   Respiratory Syncytial Virus NOT DETECTED NOT DETECTED   Bordetella pertussis NOT DETECTED NOT DETECTED   Bordetella Parapertussis NOT DETECTED NOT DETECTED   Chlamydophila pneumoniae NOT DETECTED NOT DETECTED   Mycoplasma pneumoniae NOT DETECTED NOT DETECTED    Comment: Performed at [facility], [place] [address], [place], [place] [address]  CBC     Status: Abnormal   Collection Time: 06/04/22  1:49 AM  Result Value Ref Range   WBC 12.1 (H) 4.0 - 10.5 K/uL   RBC 4.98 4.22 - 5.81 MIL/uL    Hemoglobin 15.3 13.0 - 17.0 g/dL   HCT 46.1 39.0 - 52.0 %   MCV 92.6 80.0 - 100.0 fL   MCH 30.7 26.0 - 34.0 pg   MCHC 33.2 30.0 - 36.0 g/dL   RDW 13.0 11.5 - 15.5 %   Platelets 240 150 - 400 K/uL   nRBC 0.0 0.0 - 0.2 %    Comment: Performed at [facility], [place] [address], [place],  [address]  Comprehensive metabolic panel     Status: Abnormal   Collection Time: 06/04/22  1:49 AM  Result Value Ref Range   Sodium 136 135 - 145 mmol/L   Potassium 4.2 3.5 - 5.1 mmol/L   Chloride 101 98 - 111 mmol/L   CO2 24 22 - 32 mmol/L   Glucose, Bld 134 (H) 70 - 99 mg/dL    Comment: Glucose reference range applies only to samples taken after fasting for at least 8 hours.   BUN 11 6 - 20 mg/dL   Creatinine, Ser 1.09 0.61 - 1.24 mg/dL   Calcium 8.6 (L) 8.9 - 10.3 mg/dL   Total Protein 7.0 6.5 - 8.1 g/dL   Albumin 3.7 3.5 - 5.0 g/dL   AST 29 15 - 41 U/L  ALT 31 0 - 44 U/L   Alkaline Phosphatase 59 38 - 126 U/L   Total Bilirubin 3.5 (H) 0.3 - 1.2 mg/dL   GFR, Estimated >60 >60 mL/min    Comment: (NOTE) Calculated using the CKD-EPI Creatinine Equation (2021)    Anion gap 11 5 - 15    Comment: Performed at [facility] [address], [place], [place] [address]  Lipid panel     Status: Abnormal   Collection Time: 06/04/22  1:49 AM  Result Value Ref Range   Cholesterol 176 0 - 200 mg/dL   Triglycerides 72 <150 mg/dL   HDL 27 (L) >40 mg/dL   Total CHOL/HDL Ratio 6.5 RATIO   VLDL 14 0 - 40 mg/dL   LDL Cholesterol 135 (H) 0 - 99 mg/dL    Comment:        Total Cholesterol/HDL:CHD Risk Coronary Heart Disease Risk Table                     Men   Women  1/2 Average Risk   3.4   3.3  Average Risk       5.0   4.4  2 X Average Risk   9.6   7.1  3 X Average Risk  23.4   11.0        Use the calculated Patient Ratio above and the CHD Risk Table to determine the patient's CHD Risk.        ATP III CLASSIFICATION (LDL):  <100     mg/dL   Optimal  100-129  mg/dL   Near or  Above                    Optimal  130-159  mg/dL   Borderline  160-189  mg/dL   High  >190     mg/dL   Very High Performed at [facility] [address], [place], [place] [address]   Magnesium     Status: None   Collection Time: 06/04/22  1:49 AM  Result Value Ref Range   Magnesium 2.0 1.7 - 2.4 mg/dL    Comment: Performed at [facility] [address] [place], [place] [id number]  Basic metabolic panel     Status: Abnormal   Collection Time: 06/04/22  4:20 AM  Result Value Ref Range   Sodium 138 135 - 145 mmol/L   Potassium 4.0 3.5 - 5.1 mmol/L    Comment: HEMOLYSIS AT THIS LEVEL MAY AFFECT RESULT   Chloride 101 98 - 111 mmol/L   CO2 26 22 - 32 mmol/L   Glucose, Bld 107 (H) 70 - 99 mg/dL    Comment: Glucose reference range applies only to samples taken after fasting for at least 8 hours.   BUN 10 6 - 20 mg/dL   Creatinine, Ser 1.16 0.61 - 1.24 mg/dL   Calcium 8.4 (L) 8.9 - 10.3 mg/dL   GFR, Estimated >60 >60 mL/min    Comment: (NOTE) Calculated using the CKD-EPI Creatinine Equation (2021)    Anion gap 11 5 - 15    Comment: Performed at [facility] [address] [place], [place] [address]  Magnesium     Status: None   Collection Time: 06/04/22  4:20 AM  Result Value Ref Range   Magnesium 1.9 1.7 - 2.4 mg/dL    Comment: Performed at [facility] [address], [place], [place] [address]  Phosphorus  Status: None   Collection Time: 06/04/22  4:20 AM  Result Value Ref Range   Phosphorus 3.6 2.5 - 4.6 mg/dL    Comment: Performed at [facility], [place] [address] [place], [place] [address]  Heparin level (unfractionated)     Status: Abnormal   Collection Time: 06/04/22  6:11 AM  Result Value Ref Range   Heparin Unfractionated 0.11 (L) 0.30 - 0.70 IU/mL    Comment: (NOTE) The clinical reportable range upper limit is being lowered to >1.10 to align with the FDA approved guidance for the current laboratory assay.  If heparin results  are below expected values, and patient dosage has  been confirmed, suggest follow up testing of antithrombin III levels. Performed at [facility], [place] [address], [place], [place] [address]    DG Chest 2 View  Result Date: 06/03/2022 CLINICAL DATA:  Chest pain with body aches and fatigue. EXAM: CHEST - 2 VIEW COMPARISON:  June 02, 2022 FINDINGS: The cardiac silhouette is moderately enlarged and unchanged in size. There is mild stable perihilar prominence of the pulmonary vasculature. There is no evidence of an acute infiltrate, pleural effusion or pneumothorax. The visualized skeletal structures are unremarkable. IMPRESSION: Stable cardiomegaly with findings suggestive of mild pulmonary vascular congestion. Electronically Signed   By: Virgina Norfolk M.D.   On: 06/03/2022 23:36   CT ABDOMEN PELVIS W CONTRAST  Result Date: 06/03/2022 CLINICAL DATA:  Shortness of breath, epigastric abdominal pain. EXAM: CT ABDOMEN AND PELVIS WITH CONTRAST TECHNIQUE: Multidetector CT imaging of the abdomen and pelvis was performed using the standard protocol following bolus administration of intravenous contrast. RADIATION DOSE REDUCTION: This exam was performed according to the departmental dose-optimization program which includes automated exposure control, adjustment of the mA and/or kV according to patient size and/or use of iterative reconstruction technique. CONTRAST:  124mL OMNIPAQUE IOHEXOL 300 MG/ML  SOLN COMPARISON:  10/09/2021. FINDINGS: Lower chest: The heart is enlarged and coronary artery calcifications are noted. There is a small right pleural effusion. Scattered ground-glass opacities are noted in the lower lobes bilaterally, greater on the right than on the left. Hepatobiliary: No focal abnormality in the liver. No cholelithiasis. There is mild gallbladder wall thickening, unchanged from the previous exam. No biliary ductal dilatation. Pancreas: Unremarkable. No pancreatic ductal dilatation or  surrounding inflammatory changes. Spleen: Normal in size without focal abnormality. Adrenals/Urinary Tract: No adrenal nodule or mass. Scattered hypoenhancing regions are noted in the kidneys bilaterally and most pronounced in the lower pole of the left kidney. Perinephric fat stranding is noted on the left. No renal calculus or hydronephrosis. No obstructive uropathy bilaterally. The bladder is unremarkable. Stomach/Bowel: Stomach is within normal limits. Appendix appears normal. No evidence of bowel wall thickening, distention, or inflammatory changes. No free air or pneumatosis. Scattered diverticula are present along the colon without evidence of diverticulitis. Vascular/Lymphatic: Aortic atherosclerosis. No enlarged abdominal or pelvic lymph nodes. Reproductive: Prostate gland is mildly enlarged. Other: A small amount of free fluid is noted in the right pericolic gutter and pelvis. Musculoskeletal: Degenerative changes are present in the thoracolumbar spine. There is sclerosis at the sacroiliac joint on the left, suggesting sacroiliitis. No acute or suspicious osseous abnormality. IMPRESSION: 1. Patchy hypoenhancement of the kidneys bilaterally, most pronounced in the lower pole of the left kidney suggesting pyelonephritis. No renal calculus or obstructive uropathy bilaterally. 2. Mildly enlarged prostate gland. 3. Small right pleural effusion with scattered ground-glass opacities in the lower lobes bilaterally, possible edema or infiltrate. 4.  Mild gallbladder wall thickening which is unchanged from the previous exam. No evidence of cholelithiasis. 5. Small ascites. 6. Aortic atherosclerosis and coronary artery calcifications. Electronically Signed   By: Brett Fairy M.D.   On: 06/03/2022 01:20   DG Chest Port 1 View  Result Date: 06/02/2022 CLINICAL DATA:  Shortness of breath. EXAM: PORTABLE CHEST 1 VIEW COMPARISON:  October 09, 2021 FINDINGS: There is stable moderate to marked severity enlargement of the  cardiac silhouette. Mild diffusely increased lung markings are noted, slightly more prominent within the bilateral suprahilar and bilateral infrahilar regions. Mild prominence of the perihilar pulmonary vasculature is also seen. There is no evidence of focal consolidation, pleural effusion or pneumothorax. The visualized skeletal structures are unremarkable. IMPRESSION: 1. Stable cardiomegaly with mild pulmonary vascular congestion. Electronically Signed   By: Virgina Norfolk M.D.   On: 06/02/2022 22:19    Pending Labs Unresulted Labs (From admission, onward)     Start     Ordered   06/05/22 0500  CBC  Daily,   R     See Hyperspace for full Linked Orders Report.   06/03/22 2354   06/05/22 0500  Magnesium  Tomorrow morning,   R        06/04/22 0945   06/05/22 [id number]  Basic metabolic panel  Tomorrow morning,   R        06/04/22 0945   06/04/22 0500  Lipoprotein A (LPA)  Tomorrow morning,   R        06/04/22 0056            Vitals/Pain Today's Vitals   06/04/22 0727 06/04/22 0727 06/04/22 0950 06/04/22 0951  BP:   109/89   Pulse:   88   Resp:   19   Temp: 98.1 F (36.7 C)   98.2 F (36.8 C)  TempSrc: Oral   Oral  SpO2:   97%   Weight:      Height:      PainSc:  4       Isolation Precautions Droplet precaution  Medications Medications  acetaminophen (TYLENOL) tablet 650 mg (650 mg Oral Given 06/04/22 0517)    Or  acetaminophen (TYLENOL) suppository 650 mg ( Rectal See Alternative 06/04/22 0517)  ondansetron (ZOFRAN) tablet 4 mg (has no administration in time range)    Or  ondansetron (ZOFRAN) injection 4 mg (has no administration in time range)  levalbuterol (XOPENEX) nebulizer solution 0.63 mg (has no administration in time range)  atorvastatin (LIPITOR) tablet 80 mg (has no administration in time range)  carvedilol (COREG) tablet 9.375 mg (9.375 mg Oral Given 06/04/22 0853)  ezetimibe (ZETIA) tablet 10 mg (10 mg Oral Given 06/04/22 0948)  nitroGLYCERIN (NITROSTAT) SL  tablet 0.4 mg (has no administration in time range)  sacubitril-valsartan (ENTRESTO) 49-51 mg per tablet (1 tablet Oral Given 06/04/22 0948)  spironolactone (ALDACTONE) tablet 25 mg (25 mg Oral Given 06/04/22 0948)  dapagliflozin propanediol (FARXIGA) tablet 10 mg (10 mg Oral Given 06/04/22 0948)  aspirin EC tablet 81 mg (has no administration in time range)  nitroGLYCERIN (NITROSTAT) SL tablet 0.4 mg (has no administration in time range)  ondansetron (ZOFRAN) injection 4 mg (has no administration in time range)  furosemide (LASIX) injection 20 mg (has no administration in time range)  sodium chloride 0.9 % bolus 250 mL (0 mLs Intravenous Stopped 06/03/22 2234)  sodium chloride 0.9 % bolus 250 mL (0 mLs Intravenous Stopped 06/04/22 0024)  heparin bolus via infusion 4,000 Units (4,000 Units Intravenous  Bolus from Bag 06/04/22 0021)  aspirin chewable tablet 324 mg (324 mg Oral Given 06/04/22 0120)    Or  aspirin suppository 300 mg ( Rectal See Alternative 06/04/22 0120)  calcium gluconate 1 g/ 50 mL sodium chloride IVPB (0 mg Intravenous Stopped 06/04/22 0513)  heparin bolus via infusion 2,000 Units (2,000 Units Intravenous Bolus from Bag 06/04/22 0826)  magnesium sulfate IVPB 2 g 50 mL (0 g Intravenous Stopped 06/04/22 1114)    Mobility walks     Focused Assessments Cardiac Assessment Handoff:  Cardiac Rhythm: Normal sinus rhythm Lab Results  Component Value Date   CKTOTAL 795 (H) 10/05/2009   CKMB (HH) 10/05/2009    70.9 CRITICAL VALUE NOTED.  VALUE IS CONSISTENT WITH PREVIOUSLY REPORTED AND CALLED VALUE.   TROPONINI 0.59 (HH) 08/01/2016   No results found for: "DDIMER" Does the Patient currently have chest pain? No   , Neuro Assessment Handoff:  Swallow screen pass? No  Cardiac Rhythm: Normal sinus rhythm       Neuro Assessment:   Neuro Checks:      Has TPA been given? No If patient is a Neuro Trauma and patient is going to OR before floor call report to [place] nurse:  [phone] or [phone]   R Recommendations: See Admitting Provider Note  Report given to:   Additiona

## 2022-06-04 NOTE — Progress Notes (Signed)
Heart Failure Navigator Progress Note  Assessed for Heart & Vascular TOC clinic readiness.  Patient does not meet criteria due to Advanced Heart Failure Team patient of Dr. McLean.   Navigator will sign off at this time.   Naoma Boxell, BSN, RN Heart Failure Nurse Navigator Secure Chat Only   

## 2022-06-04 NOTE — H&P (Addendum)
History and Physical    COLETIN SIGRIST [id number] DOB: Aug 06, 1980 DOA: 06/03/2022  PCP: Ladell Pier, MD  Patient coming from: home  I have personally briefly reviewed patient's old medical records in [place]  Chief Complaint:  chest pain, flank pain , fatigue , abdominal pain   HPI: Reginald Fernandez is a 42 y.o. 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 At that time patient was diagnosed with acute on chronic CHFref. Patient was treated with lasix iv with improvement per ED note and thus was discharged on increase home lasix to 40.  Patient states he continued to feel poorly so he returned to ED.  He states he had around 3L out at home and felt dehydrated. He also noted continued flank pain and epigastric pain . He also noted muscle aches and cramping in addition to chills.  He noted no cough or sore throat. He noted no chest pain or dysuria or diarrhea.  ED Course:  Vitals : 98.2, bp 128/111, hr 104, rr 18 , sat 98%  CE 220,44 Labs 11.1, hgb 15.1  Na 137, K 3.9 , cr1.24 (0.9)  [id number] rhythm , RAD low  [id number]: Stable cardiomegaly with findings suggestive of mild pulmonary vascular congestion.  Bnp 745.9 Tx 250 cc ns,heparin drip  Review of Systems: As per HPI otherwise 10 point review of systems negative.   Past Medical History:  Diagnosis Date   CAD S/P percutaneous coronary angioplasty 11/20/2013   a. 09/2009: EKG with Inf STEMI - no obstructive CAD;  b. 11/2013 [place] STEMI/PCI: LM nl, LAD 20p, D1 sm - diff dzs, D2 large - nl, LCX 95-99, OM1 100 (3.5x38 Promus Premier DES), RCA 95-51m, 80d (3.0x20 and 3.0x16 Promus Premier DES') - normal EF;  c. NSTEMI 5/18 - patent stents, otw minimal  CAD.- EF by Echo 35-40%; d. NSTEMI 10/2018 - ? culprit - Small branch of  D2,Med Rx,.  EF by Echo ~25%   Cardiomyopathy, ischemic 07/2016   h/o Inf STEMI 11/2013 - EF was "Normal"; b) NSTEMI 07/2016 (NO Cultprit on Cath) - Echo EF 35-40% (Basal Ant-Lat HK, basal-mid Inferolateral & apical Akinesis); c) NSTEMI 10/2018 -felt to be occluded small branch of D2, Echo EF further decreased to 20-25%.    Essential hypertension    Hyperlipidemia with target LDL less than 100    Marijuana abuse    Migraine    Morbid obesity (HCC)    ST-segment elevation myocardial infarction (STEMI) of inferior wall ([place]) 11/20/2013    Past Surgical History:  Procedure Laterality Date   LEFT HEART CATH AND CORONARY ANGIOGRAPHY N/A 08/01/2016   Procedure: Left Heart Cath and Coronary Angiography;  Surgeon: Sherren Mocha, MD;  Location: [facility] [facility];;  Stable 2 V CAD - patent RCA & OM stents. Diffuse, non-obstructive CAD elsewhere. Moderately elevated LVEDP --> No culprit lesion  => restarted Plavix & titrate Med Rx.   LEFT HEART CATH AND CORONARY ANGIOGRAPHY N/A 10/24/2018   Procedure: LEFT HEART CATH AND CORONARY ANGIOGRAPHY;  Surgeon: Troy Sine, MD;  Location: [place] [facility];  60% small D1, D2 has 3 branches 1 of which is small and appears to be 100%, LCx-OM widely patent with mild 55% stenosis in  the jailed AV groove LCx. RCA has mild luminal irregularities 30% proximal with widely patent stents in the mid and distal RCA. (Culprit 100% small branch of D2)   LEFT HEART CATH AND CORONARY ANGIOGRAPHY  09/2009   (Presumably in the setting of STEMI) mild RCA luminal Irregularities - MIld INf HK -- Med Rx; b)   LEFT HEART CATHETERIZATION WITH CORONARY ANGIOGRAM N/A 11/20/2013   Procedure: LEFT HEART CATHETERIZATION WITH CORONARY ANGIOGRAM;  Surgeon: Leonie Man, MD;  Location: [facility];  Service: Cardiovascular;   Inf STEMI: OM1 100%, m-dRCA 95-99% thrombotic mRCA with tandem 80% -- PCI, LAD & Cx relatively  normal.   PERCUTANEOUS CORONARY STENT INTERVENTION (PCI-S)  11/20/2013   a) PCI - OM1 Promus Premier DES 3.5 mm x 38 mm, m-d RCA Promus Premier DES  3.0 mm x 20 mm & 3.0 mm x 16 mm   RIGHT/LEFT HEART CATH AND CORONARY ANGIOGRAPHY N/A 10/11/2021   Procedure: RIGHT/LEFT HEART CATH AND CORONARY ANGIOGRAPHY;  Surgeon: Larey Dresser, MD;  Location: [place] [facility];  Service: Cardiovascular;  Laterality: N/A;   TRANSTHORACIC ECHOCARDIOGRAM  11/20/2013   LV EF 55-60%. No regional wall motion abnormality.  Normal valves    TRANSTHORACIC ECHOCARDIOGRAM  08/11/2016   (Non-STEMI-no culprit lesion): Moderately dilated LV.  Basal anterolateral HK with basal-mid inferolateral akinesis.  Mild-apical inferior akinesis.  EF 35 to 40%.  GRII DD.  Mild biatrial dilation.  Normal RV size and function. Mostly normal valves.   TRANSTHORACIC ECHOCARDIOGRAM  10/24/2018   (Non-STEMI-culprit lesion was 1 of 3 small 2 branches.)  EF 20-25%.  Mild mildly dilated LV.  GR 2 DD.  Mild biatrial dilation.  Normal PA pressures.     reports that he quit smoking about 5 years ago. His smoking use included cigarettes. He has never used smokeless tobacco. He reports that he does not drink alcohol and does not use drugs.  Allergies  Allergen Reactions   Penicillins Other (See Comments)    Childhood allergy Unknown reaction  Did it involve swelling of the face/tongue/throat, SOB, or low BP? No Did it involve sudden or severe rash/hives, skin peeling, or any reaction on the inside of your mouth or nose? No Did you need to seek medical attention at a hospital or doctor's office? No When did it last happen?      child If all above answers are "NO", may proceed with cephalosporin use.    Family History  Problem Relation Age of Onset   Hypertension Mother    Heart attack Father 82       2 MIs by age 49 (first at 64)-- CABG   Hypertension Father    Heart failure Father    Hyperlipidemia Father    Heart attack Maternal  Grandfather        10's   Heart attack Paternal Grandfather        32's   Heart attack Other     Prior to Admission medications   Medication Sig Start Date End Date Taking? Authorizing Provider  acetaminophen (TYLENOL) 500 MG tablet Take 1,000 mg by mouth 2 (two) times daily as needed for moderate pain or headache.    [provider]  aspirin EC 81 MG tablet Take 1 tablet (81 mg total) by mouth daily. 10/12/21   Lyda Jester M, PA-C  atorvastatin (LIPITOR) 80 MG tablet Take 1 tablet (80 mg total) by mouth daily at 6 PM 10/17/21   Milford, Maricela Bo, FNP  carvedilol (COREG) 6.25 MG tablet Take 1.5 tablets (9.375 mg total) by mouth 2 (two) times daily with a meal. 11/14/21   Larey Dresser, MD  dapagliflozin propanediol (FARXIGA) 10 MG TABS tablet Take 1 tablet (10 mg total) by mouth daily. 10/17/21   Milford, Maricela Bo, FNP  ezetimibe (ZETIA) 10 MG tablet Take 1 tablet (10 mg total) by mouth daily. 10/17/21   Milford, Maricela Bo, FNP  furosemide (LASIX) 20 MG tablet Take 1 tablet (20 mg total) by mouth daily. 10/17/21   Milford, Maricela Bo, FNP  nitroGLYCERIN (NITROSTAT) 0.4 MG SL tablet Place 1 tablet (0.4 mg total) under the tongue every 5 (five) minutes as needed for chest pain. 10/12/21   Lyda Jester M, PA-C  sacubitril-valsartan (ENTRESTO) 49-51 MG Take 1 tablet by mouth 2 (two) times daily. 10/17/21   Larey Dresser, MD  spironolactone (ALDACTONE) 25 MG tablet Take 1 tablet (25 mg total) by mouth daily. 10/17/21   Rafael Bihari, FNP    Physical Exam: Vitals:   06/03/22 2032 06/03/22 2036  BP: (!) 128/111   Pulse: (!) 104   Resp: 18   Temp: 98.2 F (36.8 C)   TempSrc: Oral   SpO2: 98%   Weight:  108.9 kg  Height:  6\' 2"  (1.88 m)    Constitutional: NAD, calm, comfortable Vitals:   06/03/22 2032 06/03/22 2036  BP: (!) 128/111   Pulse: (!) 104   Resp: 18   Temp: 98.2 F (36.8 C)   TempSrc: Oral   SpO2: 98%   Weight:  108.9 kg  Height:  6\' 2"  (1.88 m)    Eyes: PERRL, lids and conjunctivae normal ENMT: Mucous membranes are moist. Posterior pharynx clear of any exudate or lesions.Normal dentition.  Neck: normal, supple, no masses, no thyromegaly Respiratory: clear to auscultation bilaterally, no wheezing, no crackles. Normal respiratory effort. No accessory muscle use.  Cardiovascular: Regular rate and rhythm, no murmurs / rubs / gallops. No extremity edema. 2+ pedal pulses. No carotid bruits.  Abdomen: mild epigastric tenderness, no masses palpated. No hepatosplenomegaly. Bowel sounds positive.  Musculoskeletal: no clubbing / cyanosis. No joint deformity upper and lower extremities. Good ROM, no contractures. Normal muscle tone.  Skin: no rashes, lesions, ulcers. No induration Neurologic: CN 2-12 grossly intact. Sensation intact Strength 5/5 in all 4.  Psychiatric: Normal judgment and insight. Alert and oriented x 3. Normal mood.    Labs on Admission: I have personally reviewed following labs and imaging studies  CBC: Recent Labs  Lab 06/02/22 2047 06/03/22 2020  WBC 10.2 11.1*  NEUTROABS 5.8  --   HGB 14.3 15.1  HCT 42.5 44.5  MCV 90.8 91.2  PLT 236 [id number]   Basic Metabolic Panel: Recent Labs  Lab 06/02/22 2047 06/03/22 2020 06/03/22 2210  NA 136 137  --   K 4.1 3.9  --   CL 104 98  --   CO2 24 27  --   GLUCOSE 139* 128*  --   BUN 17 11  --   CREATININE 1.03 1.24  --   CALCIUM 8.3* 8.6*  --   MG  --   --  1.9   GFR: Estimated Creatinine Clearance: 103 mL/min (by C-G formula based on SCr of 1.24 mg/dL). Liver Function Tests: Recent Labs  Lab 06/02/22 2047  AST 25  ALT 27  ALKPHOS 59  BILITOT 2.1*  PROT 6.9  ALBUMIN 3.5   Recent Labs  Lab 06/02/22 2122  LIPASE 30  No results for input(s): "AMMONIA" in the last 168 hours. Coagulation Profile: No results for input(s): "INR", "PROTIME" in the last 168 hours. Cardiac Enzymes: No results for input(s): "CKTOTAL", "CKMB", "CKMBINDEX", "TROPONINI" in the last  168 hours. BNP (last 3 results) No results for input(s): "PROBNP" in the last 8760 hours. HbA1C: No results for input(s): "HGBA1C" in the last 72 hours. CBG: Recent Labs  Lab 06/03/22 2101  GLUCAP 126*   Lipid Profile: No results for input(s): "CHOL", "HDL", "LDLCALC", "TRIG", "CHOLHDL", "LDLDIRECT" in the last 72 hours. Thyroid Function Tests: No results for input(s): "TSH", "T4TOTAL", "FREET4", "T3FREE", "THYROIDAB" in the last 72 hours. Anemia Panel: No results for input(s): "VITAMINB12", "FOLATE", "FERRITIN", "TIBC", "IRON", "RETICCTPCT" in the last 72 hours. Urine analysis:    Component Value Date/Time   COLORURINE YELLOW 06/03/2022 0219   APPEARANCEUR CLEAR 06/03/2022 0219   LABSPEC 1.015 06/03/2022 0219   PHURINE 6.5 06/03/2022 0219   GLUCOSEU NEGATIVE 06/03/2022 0219   HGBUR TRACE (A) 06/03/2022 0219   BILIRUBINUR NEGATIVE 06/03/2022 [place] 06/03/2022 0219   PROTEINUR 100 (A) 06/03/2022 0219   NITRITE NEGATIVE 06/03/2022 0219   LEUKOCYTESUR NEGATIVE 06/03/2022 0219    Radiological Exams on Admission: DG Chest 2 View  Result Date: 06/03/2022 CLINICAL DATA:  Chest pain with body aches and fatigue. EXAM: CHEST - 2 VIEW COMPARISON:  June 02, 2022 FINDINGS: The cardiac silhouette is moderately enlarged and unchanged in size. There is mild stable perihilar prominence of the pulmonary vasculature. There is no evidence of an acute infiltrate, pleural effusion or pneumothorax. The visualized skeletal structures are unremarkable. IMPRESSION: Stable cardiomegaly with findings suggestive of mild pulmonary vascular congestion. Electronically Signed   By: Virgina Norfolk M.D.   On: 06/03/2022 23:36   CT ABDOMEN PELVIS W CONTRAST  Result Date: 06/03/2022 CLINICAL DATA:  Shortness of breath, epigastric abdominal pain. EXAM: CT ABDOMEN AND PELVIS WITH CONTRAST TECHNIQUE: Multidetector CT imaging of the abdomen and pelvis was performed using the standard protocol  following bolus administration of intravenous contrast. RADIATION DOSE REDUCTION: This exam was performed according to the departmental dose-optimization program which includes automated exposure control, adjustment of the mA and/or kV according to patient size and/or use of iterative reconstruction technique. CONTRAST:  118mL OMNIPAQUE IOHEXOL 300 MG/ML  SOLN COMPARISON:  10/09/2021. FINDINGS: Lower chest: The heart is enlarged and coronary artery calcifications are noted. There is a small right pleural effusion. Scattered ground-glass opacities are noted in the lower lobes bilaterally, greater on the right than on the left. Hepatobiliary: No focal abnormality in the liver. No cholelithiasis. There is mild gallbladder wall thickening, unchanged from the previous exam. No biliary ductal dilatation. Pancreas: Unremarkable. No pancreatic ductal dilatation or surrounding inflammatory changes. Spleen: Normal in size without focal abnormality. Adrenals/Urinary Tract: No adrenal nodule or mass. Scattered hypoenhancing regions are noted in the kidneys bilaterally and most pronounced in the lower pole of the left kidney. Perinephric fat stranding is noted on the left. No renal calculus or hydronephrosis. No obstructive uropathy bilaterally. The bladder is unremarkable. Stomach/Bowel: Stomach is within normal limits. Appendix appears normal. No evidence of bowel wall thickening, distention, or inflammatory changes. No free air or pneumatosis. Scattered diverticula are present along the colon without evidence of diverticulitis. Vascular/Lymphatic: Aortic atherosclerosis. No enlarged abdominal or pelvic lymph nodes. Reproductive: Prostate gland is mildly enlarged. Other: A small amount of free fluid is noted in the right pericolic gutter and pelvis. Musculoskeletal: Degenerative changes are present in the thoracolumbar spine.  There is sclerosis at the sacroiliac joint on the left, suggesting sacroiliitis. No acute or  suspicious osseous abnormality. IMPRESSION: 1. Patchy hypoenhancement of the kidneys bilaterally, most pronounced in the lower pole of the left kidney suggesting pyelonephritis. No renal calculus or obstructive uropathy bilaterally. 2. Mildly enlarged prostate gland. 3. Small right pleural effusion with scattered ground-glass opacities in the lower lobes bilaterally, possible edema or infiltrate. 4. Mild gallbladder wall thickening which is unchanged from the previous exam. No evidence of cholelithiasis. 5. Small ascites. 6. Aortic atherosclerosis and coronary artery calcifications. Electronically Signed   By: Brett Fairy M.D.   On: 06/03/2022 01:20   DG Chest Port 1 View  Result Date: 06/02/2022 CLINICAL DATA:  Shortness of breath. EXAM: PORTABLE CHEST 1 VIEW COMPARISON:  October 09, 2021 FINDINGS: There is stable moderate to marked severity enlargement of the cardiac silhouette. Mild diffusely increased lung markings are noted, slightly more prominent within the bilateral suprahilar and bilateral infrahilar regions. Mild prominence of the perihilar pulmonary vasculature is also seen. There is no evidence of focal consolidation, pleural effusion or pneumothorax. The visualized skeletal structures are unremarkable. IMPRESSION: 1. Stable cardiomegaly with mild pulmonary vascular congestion. Electronically Signed   By: Virgina Norfolk M.D.   On: 06/02/2022 22:19    EKG: Independently reviewed.see above  Assessment/Plan  Acute on chronic CHFref  Cardiac Ascites  -lasix iv daily , additional prn dosing as clinically indicated -cycle ce, check tsh, echo in am  -ekg without hyperacute st -twave changes  -cardiology consult in am  -continue entresto, spironolactone, farxiga   Hx CAD s/p MI s/p multiple DES Nstemi presumed type II r/o type I - continue to cycle ce  -heparin drip  - echo in am  -cardiology consulted by EDP -await final cardiology recs  -continue asa, atorvastatin ,zetia    Flank  pain  -ct with ? Pyelo, however noted ascites  -UA no sign infection , f/u with culture  -patient endorse fever /chills/ flank pain -will check inflammatory markers to be complete   Hypertension  -resume   HLD -continue statin   Fatty liver  No active issues        DVT prophylaxis: heparin drip  Code Status: full/ as discussed per patient wishes in event of cardiac arrest  Family Communication: none at beside Disposition Plan: patient  expected to be admitted greater than 2 midnights  Consults called: Cardiology  Admission status: progressive   Clance Boll MD [facility]   If 7PM-7AM, please contact night-coverage [email or web address] Password [facility]  06/04/2022, 12:21 AM

## 2022-06-05 ENCOUNTER — Other Ambulatory Visit (HOSPITAL_COMMUNITY): Payer: BLUE CROSS/BLUE SHIELD

## 2022-06-05 ENCOUNTER — Other Ambulatory Visit (HOSPITAL_COMMUNITY): Payer: Self-pay

## 2022-06-05 ENCOUNTER — Inpatient Hospital Stay (HOSPITAL_COMMUNITY): Payer: BLUE CROSS/BLUE SHIELD

## 2022-06-05 DIAGNOSIS — I25118 Atherosclerotic heart disease of native coronary artery with other forms of angina pectoris: Secondary | ICD-10-CM | POA: Diagnosis not present

## 2022-06-05 DIAGNOSIS — I5023 Acute on chronic systolic (congestive) heart failure: Secondary | ICD-10-CM

## 2022-06-05 DIAGNOSIS — R7989 Other specified abnormal findings of blood chemistry: Secondary | ICD-10-CM | POA: Diagnosis not present

## 2022-06-05 DIAGNOSIS — R52 Pain, unspecified: Secondary | ICD-10-CM

## 2022-06-05 DIAGNOSIS — I2699 Other pulmonary embolism without acute cor pulmonale: Secondary | ICD-10-CM

## 2022-06-05 DIAGNOSIS — M7989 Other specified soft tissue disorders: Secondary | ICD-10-CM | POA: Diagnosis not present

## 2022-06-05 DIAGNOSIS — N280 Ischemia and infarction of kidney: Secondary | ICD-10-CM | POA: Diagnosis not present

## 2022-06-05 LAB — CBC
HCT: 51.9 % (ref 39.0–52.0)
Hemoglobin: 18.1 g/dL — ABNORMAL HIGH (ref 13.0–17.0)
MCH: 31.2 pg (ref 26.0–34.0)
MCHC: 34.9 g/dL (ref 30.0–36.0)
MCV: 89.3 fL (ref 80.0–100.0)
Platelets: 240 10*3/uL (ref 150–400)
RBC: 5.81 MIL/uL (ref 4.22–5.81)
RDW: 13 % (ref 11.5–15.5)
WBC: 12.9 10*3/uL — ABNORMAL HIGH (ref 4.0–10.5)
nRBC: 0 % (ref 0.0–0.2)

## 2022-06-05 LAB — BASIC METABOLIC PANEL
Anion gap: 13 (ref 5–15)
BUN: 13 mg/dL (ref 6–20)
CO2: 23 mmol/L (ref 22–32)
Calcium: 8.2 mg/dL — ABNORMAL LOW (ref 8.9–10.3)
Chloride: 95 mmol/L — ABNORMAL LOW (ref 98–111)
Creatinine, Ser: 1.13 mg/dL (ref 0.61–1.24)
GFR, Estimated: >60 mL/min (ref >60)
Glucose, Bld: 129 mg/dL — ABNORMAL HIGH (ref 70–99)
Potassium: 3.7 mmol/L (ref 3.5–5.1)
Sodium: 131 mmol/L — ABNORMAL LOW (ref 135–145)

## 2022-06-05 LAB — PROCALCITONIN: Procalcitonin: <0.1 ng/mL

## 2022-06-05 LAB — HEPARIN LEVEL (UNFRACTIONATED)
Heparin Unfractionated: 0.17 IU/mL — ABNORMAL LOW (ref 0.30–0.70)
Heparin Unfractionated: <0.1 IU/mL — ABNORMAL LOW (ref 0.30–0.70)

## 2022-06-05 LAB — LIPOPROTEIN A (LPA): Lipoprotein (a): 12.8 nmol/L (ref <75.0)

## 2022-06-05 LAB — MAGNESIUM: Magnesium: 2.2 mg/dL (ref 1.7–2.4)

## 2022-06-05 MED ORDER — SENNOSIDES-DOCUSATE SODIUM 8.6-50 MG PO TABS
1.0000 | ORAL_TABLET | Freq: Two times a day (BID) | ORAL | Status: DC
  Administered 2022-06-05 – 2022-06-08 (×7): 1 via ORAL
  Filled 2022-06-05 (×7): qty 1

## 2022-06-05 MED ORDER — IOHEXOL 350 MG/ML SOLN
100.0000 mL | Freq: Once | INTRAVENOUS | Status: AC | PRN
  Administered 2022-06-05: 100 mL via INTRAVENOUS

## 2022-06-05 MED ORDER — HEPARIN BOLUS VIA INFUSION
3000.0000 [IU] | Freq: Once | INTRAVENOUS | Status: AC
  Administered 2022-06-05: 3000 [IU] via INTRAVENOUS
  Filled 2022-06-05: qty 3000

## 2022-06-05 MED ORDER — POLYETHYLENE GLYCOL 3350 17 G PO PACK
17.0000 g | PACK | Freq: Every day | ORAL | Status: DC
  Administered 2022-06-05 – 2022-06-07 (×3): 17 g via ORAL
  Filled 2022-06-05 (×4): qty 1

## 2022-06-05 MED ORDER — HEPARIN (PORCINE) 25000 UT/250ML-% IV SOLN
2100.0000 [IU]/h | INTRAVENOUS | Status: DC
  Administered 2022-06-05: 2000 [IU]/h via INTRAVENOUS
  Filled 2022-06-05 (×2): qty 250

## 2022-06-05 MED ORDER — OXYCODONE-ACETAMINOPHEN 5-325 MG PO TABS
1.0000 | ORAL_TABLET | Freq: Four times a day (QID) | ORAL | Status: DC | PRN
  Administered 2022-06-05: 1 via ORAL
  Administered 2022-06-05 – 2022-06-06 (×5): 2 via ORAL
  Administered 2022-06-07: 1 via ORAL
  Administered 2022-06-07 – 2022-06-08 (×3): 2 via ORAL
  Filled 2022-06-05 (×4): qty 2
  Filled 2022-06-05: qty 1
  Filled 2022-06-05 (×3): qty 2
  Filled 2022-06-05: qty 1
  Filled 2022-06-05 (×2): qty 2

## 2022-06-05 MED ORDER — HEPARIN BOLUS VIA INFUSION
4000.0000 [IU] | Freq: Once | INTRAVENOUS | Status: AC
  Administered 2022-06-05: 4000 [IU] via INTRAVENOUS
  Filled 2022-06-05: qty 4000

## 2022-06-05 MED ORDER — HEPARIN BOLUS VIA INFUSION
2000.0000 [IU] | Freq: Once | INTRAVENOUS | Status: AC
  Administered 2022-06-05: 2000 [IU] via INTRAVENOUS
  Filled 2022-06-05: qty 2000

## 2022-06-05 MED ORDER — HEPARIN (PORCINE) 25000 UT/250ML-% IV SOLN
INTRAVENOUS | Status: AC
  Administered 2022-06-05: 1700 [IU]/h via INTRAVENOUS
  Filled 2022-06-05: qty 250

## 2022-06-05 NOTE — Progress Notes (Signed)
Bilateral lower extremity venous study completed.   Preliminary results relayed to RN.  Please see CV Procedures for preliminary results.  Naketa Daddario, RVT  10:13 AM 06/05/22

## 2022-06-05 NOTE — Progress Notes (Addendum)
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  CT chest with small pleural effusion, groundglass opacities in lower lungs concerning for edema, ?pyelonephritis. -3/18, worsening chest and abdominal pain, CT positive for pulmonary embolism  Subjective: -Having abdominal, back pain  Assessment and Plan:  Acute bilateral pulmonary embolism -Likely triggered by sluggish flow from biventricular failure -Continue IV heparin today, transition to [place] tomorrow -Repeat echo yesterday unchanged  Acute on chronic biventricular failure -Echo 7/23 noted EF less than 20%, severely reduced RV, LHC with diffused CAD in 7/23 -poor historian, CT chest suggestive of Pulm edema, small effusion, ascites -Repeat echo without changes -Continue Coreg, Entresto, Aldactone, Farxiga -Hold further IV Lasix, changed to p.o. Lasix tomorrow -Cards following   Hx CAD s/p MI s/p multiple DES Demand ischemia, mildly elevated troponin -Do not suspect ACS, discontinue heparin, continue aspirin, Zetia, Lipitor -Cards following, cath 7/23 noted patent stents with moderate disease in other vessels -Continue aspirin, Zetia, atorvastatin   Flank pain, back pain  -ct with ? Pyelo, no clear symptoms of UTI, UA is not impressive, has mild leukocytosis, procalcitonin <0.1, follow-up urine cultures -Symptoms may be related to PE however does not explain abnormal CT findings -empiric ceftriaxone for now, discontinue [place]  if cultures are positive   Hypertension  -meds as above   HLD -continue statin    Fatty liver  -no changes in liver on CT    DVT prophylaxis: IV heparin Code Status: Full code Family Communication: None present Disposition Plan: Home likely 2 to 3 days  Consultants: Cards   Procedures:   Antimicrobials:    Objective: Vitals:   06/04/22 2100 06/04/22 2154 06/05/22 0242 06/05/22 0702  BP: 105/79 106/79 111/81   Pulse: 84  84   Resp: 18  16   Temp: 97.9 F (36.6 C)     TempSrc: Oral     SpO2: 99%  93%   Weight:    107.2 kg  Height:        Intake/Output Summary (Last 24 hours) at 06/05/2022 1326 Last data filed at 06/05/2022 0700 Gross per 24 hour  Intake 675.01 ml  Output 750 ml  Net -74.99 ml   Filed Weights   06/03/22 2036 06/05/22 0702  Weight: 108.9 kg 107.2 kg    Examination:  General exam: Chronically ill male laying in bed, AAOx3 CVS: S1-S2, regular rhythm Lungs: Decreased breath sounds to bases otherwise clear Abdomen: Soft, nontender, bowel sounds present Extremities: No edema Skin: No rashes Psychiatry:  Mood & affect appropriate.     Data Reviewed:   CBC: Recent Labs  Lab 06/02/22 2047 06/03/22 2020 06/04/22 0149 06/05/22 0151  WBC 10.2 11.1* 12.1* 12.9*  NEUTROABS 5.8  --   --   --   HGB 14.3 15.1 15.3 18.1*  HCT 42.5 44.5 46.1 51.9  MCV 90.8 91.2 92.6 89.3  PLT 236 228 240 [id number]   Basic Metabolic Panel: Recent Labs  Lab 06/02/22 2047 06/03/22 2020 06/03/22 2210 06/04/22 0149 06/04/22 0420  06/05/22 0151  NA 136 137  --  136 138 131*  K 4.1 3.9  --  4.2 4.0 3.7  CL 104 98  --  101 101 95*  CO2 24 27  --  24 26 23   GLUCOSE 139* 128*  --  134* 107* 129*  BUN 17 11  --  11 10 13   CREATININE 1.03 1.24  --  1.09 1.16 1.13  CALCIUM 8.3* 8.6*  --  8.6* 8.4* 8.2*  MG  --   --  1.9 2.0 1.9 2.2  PHOS  --   --   --   --  3.6  --    GFR: Estimated Creatinine Clearance: 112.2 mL/min (by C-G formula based on SCr of 1.13  mg/dL). Liver Function Tests: Recent Labs  Lab 06/02/22 2047 06/04/22 0149  AST 25 29  ALT 27 31  ALKPHOS 59 59  BILITOT 2.1* 3.5*  PROT 6.9 7.0  ALBUMIN 3.5 3.7   Recent Labs  Lab 06/02/22 2122  LIPASE 30   No results for input(s): "AMMONIA" in the last 168 hours. Coagulation Profile: No results for input(s): "INR", "PROTIME" in the last 168 hours. Cardiac Enzymes: No results for input(s): "CKTOTAL", "CKMB", "CKMBINDEX", "TROPONINI" in the last 168 hours. BNP (last 3 results) No results for input(s): "PROBNP" in the last 8760 hours. HbA1C: No results for input(s): "HGBA1C" in the last 72 hours. CBG: Recent Labs  Lab 06/03/22 2101  GLUCAP 126*   Lipid Profile: Recent Labs    06/04/22 0149  CHOL 176  HDL 27*  LDLCALC 135*  TRIG 72  CHOLHDL 6.5   Thyroid Function Tests: No results for input(s): "TSH", "T4TOTAL", "FREET4", "T3FREE", "THYROIDAB" in the last 72 hours. Anemia Panel: No results for input(s): "VITAMINB12", "FOLATE", "FERRITIN", "TIBC", "IRON", "RETICCTPCT" in the last 72 hours. Urine analysis:    Component Value Date/Time   COLORURINE YELLOW 06/04/2022 2102   APPEARANCEUR CLEAR 06/04/2022 2102   LABSPEC 1.006 06/04/2022 2102   PHURINE 7.0 06/04/2022 2102   GLUCOSEU >=500 (A) 06/04/2022 2102   HGBUR SMALL (A) 06/04/2022 2102   BILIRUBINUR NEGATIVE 06/04/2022 2102   KETONESUR NEGATIVE 06/04/2022 2102   PROTEINUR 30 (A) 06/04/2022 2102   NITRITE NEGATIVE 06/04/2022 2102   LEUKOCYTESUR NEGATIVE 06/04/2022 2102   Sepsis Labs: @LABRCNTIP (procalcitonin:4,lacticidven:4)  ) Recent Results (from the past 240 hour(s))  Respiratory (~20 pathogens) panel by PCR     Status: None   Collection Time: 06/04/22  1:46 AM   Specimen: Nasopharyngeal Swab; Respiratory  Result Value Ref Range Status   Adenovirus NOT DETECTED NOT DETECTED Final   Coronavirus 229E NOT DETECTED NOT DETECTED Final    Comment: (NOTE) The Coronavirus on the Respiratory Panel, DOES  NOT test for the novel  Coronavirus (2019 nCoV)    Coronavirus HKU1 NOT DETECTED NOT DETECTED Final   Coronavirus NL63 NOT DETECTED NOT DETECTED Final   Coronavirus OC43 NOT DETECTED NOT DETECTED Final   Metapneumovirus NOT DETECTED NOT DETECTED Final   Rhinovirus / Enterovirus NOT DETECTED NOT DETECTED Final   Influenza A NOT DETECTED NOT DETECTED Final   Influenza B NOT DETECTED NOT DETECTED Final   Parainfluenza Virus 1 NOT DETECTED NOT DETECTED Final   Parainfluenza Virus 2 NOT DETECTED NOT DETECTED Final   Parainfluenza Virus 3 NOT DETECTED NOT DETECTED Final   Parainfluenza Virus 4 NOT DETECTED NOT DETECTED Final   Respiratory Syncytial Virus NOT DETECTED NOT DETECTED Final   Bordetella pertussis NOT DETECTED NOT DETECTED Final  Bordetella Parapertussis NOT DETECTED NOT DETECTED Final   Chlamydophila pneumoniae NOT DETECTED NOT DETECTED Final   Mycoplasma pneumoniae NOT DETECTED NOT DETECTED Final    Comment: Performed at [facility], [place] [address], [place], [place] [address]     Radiology Studies: VAS [place] LOWER EXTREMITY VENOUS (DVT)  Result Date: 06/05/2022  Lower Venous DVT Study Patient Name:  Reginald Fernandez  Date of Exam:   06/05/2022 Medical Rec #: [id number]       Accession #:    [id number] Date of Birth: 1981-02-02       Patient Gender: M Patient Age:   41 years Exam Location:  [facility] Procedure:      VAS [place] LOWER EXTREMITY VENOUS (DVT) Referring Phys: Eugenie Norrie --------------------------------------------------------------------------------  Indications: Swelling, Pain, and pulmonary embolism.  Anticoagulation: Heparin. Comparison Study: No prior study. Performing Technologist: McKayla Maag RVT, [place]  Examination Guidelines: A complete evaluation includes B-mode imaging, spectral Doppler, color Doppler, and power Doppler as needed of all accessible portions of each vessel. Bilateral testing is considered an integral part of a complete examination. Limited  examinations for reoccurring indications may be performed as noted. The reflux portion of the exam is performed with the patient in reverse Trendelenburg.  +---------+---------------+---------+-----------+----------+--------------+ RIGHT    CompressibilityPhasicitySpontaneityPropertiesThrombus Aging +---------+---------------+---------+-----------+----------+--------------+ CFV      Full           Yes      Yes                                 +---------+---------------+---------+-----------+----------+--------------+ SFJ      Full                                                        +---------+---------------+---------+-----------+----------+--------------+ FV Prox  Full                                                        +---------+---------------+---------+-----------+----------+--------------+ FV Mid   Full                                                        +---------+---------------+---------+-----------+----------+--------------+ FV DistalFull                                                        +---------+---------------+---------+-----------+----------+--------------+ PFV      Full                                                        +---------+---------------+---------+-----------+----------+--------------+ POP      None  No       No                   Acute          +---------+---------------+---------+-----------+----------+--------------+ PTV      Full                                                        +---------+---------------+---------+-----------+----------+--------------+ PERO     Full                                                        +---------+---------------+---------+-----------+----------+--------------+   +---------+---------------+---------+-----------+----------+--------------+ LEFT     CompressibilityPhasicitySpontaneityPropertiesThrombus Aging  +---------+---------------+---------+-----------+----------+--------------+ CFV      Full           Yes      Yes                                 +---------+---------------+---------+-----------+----------+--------------+ SFJ      Full                                                        +---------+---------------+---------+-----------+----------+--------------+ FV Prox  Full                                                        +---------+---------------+---------+-----------+----------+--------------+ FV Mid   Full                                                        +---------+---------------+---------+-----------+----------+--------------+ FV DistalNone           No       No                   Acute          +---------+---------------+---------+-----------+----------+--------------+ PFV      Full                                                        +---------+---------------+---------+-----------+----------+--------------+ POP      Full           Yes      Yes                                 +---------+---------------+---------+-----------+----------+--------------+ PTV      None  No       No                   Acute          +---------+---------------+---------+-----------+----------+--------------+ PERO     Full                                                        +---------+---------------+---------+-----------+----------+--------------+     Summary: RIGHT: - Findings consistent with acute deep vein thrombosis involving the right popliteal vein. - A cystic structure is found in the popliteal fossa.  LEFT: - Findings consistent with acute deep vein thrombosis involving the left femoral vein, and left posterior tibial veins. - No cystic structure found in the popliteal fossa.  *See table(s) above for measurements and observations.    Preliminary    CT ABDOMEN PELVIS W CONTRAST  Result Date: 06/05/2022 CLINICAL DATA:  Hemoptysis;  Abdominal pain, acute, nonlocalized EXAM: CT ANGIOGRAPHY CHEST CT ABDOMEN AND PELVIS WITH CONTRAST TECHNIQUE: Multidetector CT imaging of the chest was performed using the standard protocol during bolus administration of intravenous contrast. Multiplanar CT image reconstructions and MIPs were obtained to evaluate the vascular anatomy. Multidetector CT imaging of the abdomen and pelvis was performed using the standard protocol during bolus administration of intravenous contrast. RADIATION DOSE REDUCTION: This exam was performed according to the departmental dose-optimization program which includes automated exposure control, adjustment of the mA and/or kV according to patient size and/or use of iterative reconstruction technique. CONTRAST:  124mL OMNIPAQUE IOHEXOL 350 MG/ML SOLN COMPARISON:  CT abdomen pelvis 06/03/2022 FINDINGS: CTA CHEST FINDINGS Cardiovascular: There is adequate opacification of the pulmonary arterial tree. There are multiple branching intraluminal filling defects identified within the segmental pulmonary arteries of the a right upper lobe and posterior basal lower lobes bilaterally in keeping with acute pulmonary embolism. The embolic burden is moderate. The central pulmonary arteries are enlarged in keeping with changes of pulmonary arterial hypertension. There is no CT evidence of right heart strain with the RV/LV ratio measuring 0.9. Mild cardiomegaly is present with biventricular enlargement. Stenting of the left circumflex and right coronary arteries has been performed. Moderate multi-vessel coronary artery calcification. No pericardial effusion. No significant aortic atherosclerotic calcification or aneurysmal dilation. Aberrant origin of the right subclavian artery noted. Mediastinum/Nodes: No enlarged mediastinal, hilar, or axillary lymph nodes. Thyroid gland, trachea, and esophagus demonstrate no significant findings. Lungs/Pleura: Patchy infiltrate within the posterior basal right lower  lobe is nonspecific, possibly inflammatory or hemorrhagic in the setting of a small pulmonary infarct. The lungs are otherwise clear. No pneumothorax or pleural effusion. No central obstructing lesion. Musculoskeletal: No acute bone abnormality. No lytic or blastic bone lesion. Review of the MIP images confirms the above findings. CT ABDOMEN and PELVIS FINDINGS Hepatobiliary: No focal liver abnormality is seen. No gallstones, gallbladder wall thickening, or biliary dilatation. Pancreas: Unremarkable Spleen: Unremarkable Adrenals/Urinary Tract: The adrenal glands are unremarkable. The kidneys are normal in size and position. Mild cortical scarring noted bilaterally. There is heterogeneous cortical enhancement again identified within the kidneys bilaterally which may reflect a superimposed inflammatory process such as pyelonephritis. However, the area of more focal hypoenhancement within the lower pole of the left kidney now demonstrates frank non enhancement and is more compatible with an acute renal infarct. This may represent a infarct related to  a paradoxical embolus given the presence of acute pulmonary embolism described above. Contrast staining noted within the infarcted region. No hydronephrosis. No intrarenal or ureteral calculi. The bladder is unremarkable. Stomach/Bowel: Stomach is within normal limits. Appendix appears normal. No evidence of bowel wall thickening, distention, or inflammatory changes. Vascular/Lymphatic: Aortic atherosclerosis. No enlarged abdominal or pelvic lymph nodes. Reproductive: Mild prostatic hypertrophy. Other: No abdominal wall hernia or abnormality. No abdominopelvic ascites. Musculoskeletal: No acute bone abnormality. No lytic or blastic bone lesion. Review of the MIP images confirms the above findings. IMPRESSION: 1. Acute pulmonary embolism with moderate embolic burden. No CT evidence of right heart strain. 2. Morphologic changes in keeping with pulmonary arterial  hypertension. 3. Mild cardiomegaly with biventricular enlargement. Moderate multi-vessel coronary artery calcification. 4. Patchy infiltrate within the posterior basal right lower lobe, nonspecific, possibly inflammatory or hemorrhagic in the setting of a small pulmonary infarct. 5. Heterogeneous cortical enhancement within the kidneys bilaterally which may reflect a superimposed inflammatory process such as pyelonephritis. However, the area of more focal hypoenhancement within the lower pole of the left kidney now demonstrates frank non enhancement and is more compatible with an evolving acute renal infarct. This may represent an infarct related to a paradoxical embolus given the presence of acute pulmonary embolism described above and echocardiography may be helpful to assess for the presence of an intracardiac shunt Aortic Atherosclerosis (ICD10-I70.0). Electronically Signed   By: Fidela Salisbury M.D.   On: 06/05/2022 01:23   CT Angio Chest Pulmonary Embolism (PE) W or WO Contrast  Result Date: 06/05/2022 CLINICAL DATA:  Hemoptysis; Abdominal pain, acute, nonlocalized EXAM: CT ANGIOGRAPHY CHEST CT ABDOMEN AND PELVIS WITH CONTRAST TECHNIQUE: Multidetector CT imaging of the chest was performed using the standard protocol during bolus administration of intravenous contrast. Multiplanar CT image reconstructions and MIPs were obtained to evaluate the vascular anatomy. Multidetector CT imaging of the abdomen and pelvis was performed using the standard protocol during bolus administration of intravenous contrast. RADIATION DOSE REDUCTION: This exam was performed according to the departmental dose-optimization program which includes automated exposure control, adjustment of the mA and/or kV according to patient size and/or use of iterative reconstruction technique. CONTRAST:  189mL OMNIPAQUE IOHEXOL 350 MG/ML SOLN COMPARISON:  CT abdomen pelvis 06/03/2022 FINDINGS: CTA CHEST FINDINGS Cardiovascular: There is adequate  opacification of the pulmonary arterial tree. There are multiple branching intraluminal filling defects identified within the segmental pulmonary arteries of the a right upper lobe and posterior basal lower lobes bilaterally in keeping with acute pulmonary embolism. The embolic burden is moderate. The central pulmonary arteries are enlarged in keeping with changes of pulmonary arterial hypertension. There is no CT evidence of right heart strain with the RV/LV ratio measuring 0.9. Mild cardiomegaly is present with biventricular enlargement. Stenting of the left circumflex and right coronary arteries has been performed. Moderate multi-vessel coronary artery calcification. No pericardial effusion. No significant aortic atherosclerotic calcification or aneurysmal dilation. Aberrant origin of the right subclavian artery noted. Mediastinum/Nodes: No enlarged mediastinal, hilar, or axillary lymph nodes. Thyroid gland, trachea, and esophagus demonstrate no significant findings. Lungs/Pleura: Patchy infiltrate within the posterior basal right lower lobe is nonspecific, possibly inflammatory or hemorrhagic in the setting of a small pulmonary infarct. The lungs are otherwise clear. No pneumothorax or pleural effusion. No central obstructing lesion. Musculoskeletal: No acute bone abnormality. No lytic or blastic bone lesion. Review of the MIP images confirms the above findings. CT ABDOMEN and PELVIS FINDINGS Hepatobiliary: No focal liver abnormality is seen. No gallstones, gallbladder wall  thickening, or biliary dilatation. Pancreas: Unremarkable Spleen: Unremarkable Adrenals/Urinary Tract: The adrenal glands are unremarkable. The kidneys are normal in size and position. Mild cortical scarring noted bilaterally. There is heterogeneous cortical enhancement again identified within the kidneys bilaterally which may reflect a superimposed inflammatory process such as pyelonephritis. However, the area of more focal hypoenhancement  within the lower pole of the left kidney now demonstrates frank non enhancement and is more compatible with an acute renal infarct. This may represent a infarct related to a paradoxical embolus given the presence of acute pulmonary embolism described above. Contrast staining noted within the infarcted region. No hydronephrosis. No intrarenal or ureteral calculi. The bladder is unremarkable. Stomach/Bowel: Stomach is within normal limits. Appendix appears normal. No evidence of bowel wall thickening, distention, or inflammatory changes. Vascular/Lymphatic: Aortic atherosclerosis. No enlarged abdominal or pelvic lymph nodes. Reproductive: Mild prostatic hypertrophy. Other: No abdominal wall hernia or abnormality. No abdominopelvic ascites. Musculoskeletal: No acute bone abnormality. No lytic or blastic bone lesion. Review of the MIP images confirms the above findings. IMPRESSION: 1. Acute pulmonary embolism with moderate embolic burden. No CT evidence of right heart strain. 2. Morphologic changes in keeping with pulmonary arterial hypertension. 3. Mild cardiomegaly with biventricular enlargement. Moderate multi-vessel coronary artery calcification. 4. Patchy infiltrate within the posterior basal right lower lobe, nonspecific, possibly inflammatory or hemorrhagic in the setting of a small pulmonary infarct. 5. Heterogeneous cortical enhancement within the kidneys bilaterally which may reflect a superimposed inflammatory process such as pyelonephritis. However, the area of more focal hypoenhancement within the lower pole of the left kidney now demonstrates frank non enhancement and is more compatible with an evolving acute renal infarct. This may represent an infarct related to a paradoxical embolus given the presence of acute pulmonary embolism described above and echocardiography may be helpful to assess for the presence of an intracardiac shunt Aortic Atherosclerosis (ICD10-I70.0). Electronically Signed   By: Fidela Salisbury M.D.   On: 06/05/2022 01:23   ECHOCARDIOGRAM COMPLETE  Result Date: 06/04/2022    ECHOCARDIOGRAM REPORT   Patient Name:   Reginald Fernandez Date of Exam: 06/04/2022 Medical Rec #:  [id number]      Height:       74.0 in Accession #:    [id number]     Weight:       240.0 lb Date of Birth:  July 04, 1980      BSA:          2.349 m Patient Age:    71 years       BP:           111/81 mmHg Patient Gender: M              HR:           100 bpm. Exam Location:  Inpatient Procedure: 2D Echo, Cardiac Doppler, Color Doppler and Intracardiac            Opacification Agent Indications:    NSTEMI I21.4  History:        Patient has prior history of Echocardiogram examinations, most                 recent 10/10/2021. Cardiomyopathy, CAD and Previous Myocardial                 Infarction; Risk Factors:Hypertension and Dyslipidemia. S/P                 percutaneous coronary angioplasty 11/20/2013.  Sonographer:    Wilkie Aye RVT  RCS Referring Phys: [id number] [place]  1. Left ventricular ejection fraction, by estimation, is <20%. The left ventricle has severely decreased function. The left ventricle demonstrates global hypokinesis. The left ventricular internal cavity size was severely dilated. Left ventricular diastolic parameters are consistent with Grade II diastolic dysfunction (pseudonormalization).  2. Right ventricular systolic function is severely reduced. The right ventricular size is moderately enlarged. Tricuspid regurgitation signal is inadequate for assessing PA pressure.  3. Left atrial size was mildly dilated.  4. Right atrial size was mildly dilated.  5. The mitral valve is grossly normal. Trivial mitral valve regurgitation. No evidence of mitral stenosis.  6. The aortic valve is tricuspid. Aortic valve regurgitation is not visualized. No aortic stenosis is present.  7. The inferior vena cava is normal in size with greater than 50% respiratory variability, suggesting right atrial pressure of 3  mmHg. Comparison(s): No significant change from prior study. Conclusion(s)/Recommendation(s): No left ventricular mural or apical thrombus/thrombi. FINDINGS  Left Ventricle: Left ventricular ejection fraction, by estimation, is <20%. The left ventricle has severely decreased function. The left ventricle demonstrates global hypokinesis. Definity contrast agent was given IV to delineate the left ventricular endocardial borders. The left ventricular internal cavity size was severely dilated. There is no left ventricular hypertrophy. Left ventricular diastolic parameters are consistent with Grade II diastolic dysfunction (pseudonormalization). Right Ventricle: The right ventricular size is moderately enlarged. No increase in right ventricular wall thickness. Right ventricular systolic function is severely reduced. Tricuspid regurgitation signal is inadequate for assessing PA pressure. Left Atrium: Left atrial size was mildly dilated. Right Atrium: Right atrial size was mildly dilated. Pericardium: There is no evidence of pericardial effusion. Mitral Valve: The mitral valve is grossly normal. Trivial mitral valve regurgitation. No evidence of mitral valve stenosis. Tricuspid Valve: The tricuspid valve is grossly normal. Tricuspid valve regurgitation is trivial. No evidence of tricuspid stenosis. Aortic Valve: The aortic valve is tricuspid. Aortic valve regurgitation is not visualized. No aortic stenosis is present. Aortic valve mean gradient measures 2.0 mmHg. Aortic valve peak gradient measures 3.0 mmHg. Aortic valve area, by VTI measures 3.42 cm. Pulmonic Valve: The pulmonic valve was grossly normal. Pulmonic valve regurgitation is trivial. No evidence of pulmonic stenosis. Aorta: The aortic root and ascending aorta are structurally normal, with no evidence of dilitation. Venous: The inferior vena cava is normal in size with greater than 50% respiratory variability, suggesting right atrial pressure of 3 mmHg.  IAS/Shunts: The atrial septum is grossly normal.  LEFT VENTRICLE PLAX 2D LVIDd:         6.90 cm   Diastology LVIDs:         5.80 cm   LV e' medial:    4.60 cm/s LV PW:         1.30 cm   LV E/e' medial:  13.2 LV IVS:        1.10 cm   LV e' lateral:   6.46 cm/s LVOT diam:     2.20 cm   LV E/e' lateral: 9.4 LV SV:         41 LV SV Index:   18 LVOT Area:     3.80 cm  RIGHT VENTRICLE             IVC RV Basal diam:  4.30 cm     IVC diam: 1.20 cm RV Mid diam:    3.40 cm RV S prime:     10.70 cm/s TAPSE (M-mode): 2.1 cm LEFT ATRIUM  Index        RIGHT ATRIUM           Index LA diam:        5.10 cm 2.17 cm/m   RA Area:     24.00 cm LA Vol (A2C):   94.5 ml 40.24 ml/m  RA Volume:   81.70 ml  34.79 ml/m LA Vol (A4C):   86.4 ml 36.79 ml/m LA Biplane Vol: 91.9 ml 39.13 ml/m  AORTIC VALVE                    PULMONIC VALVE AV Area (Vmax):    3.37 cm     PV Vmax:       0.68 m/s AV Area (Vmean):   3.37 cm     PV Peak grad:  1.8 mmHg AV Area (VTI):     3.42 cm AV Vmax:           87.20 cm/s AV Vmean:          67.800 cm/s AV VTI:            0.121 m AV Peak Grad:      3.0 mmHg AV Mean Grad:      2.0 mmHg LVOT Vmax:         77.40 cm/s LVOT Vmean:        60.100 cm/s LVOT VTI:          0.109 m LVOT/AV VTI ratio: 0.90  AORTA Ao Root diam: 3.50 cm Ao Asc diam:  3.50 cm Ao Arch diam: 3.4 cm MITRAL VALVE MV Area (PHT): 6.60 cm    SHUNTS MV Decel Time: 115 msec    Systemic VTI:  0.11 m MV E velocity: 60.80 cm/s  Systemic Diam: 2.20 cm MV A velocity: 43.00 cm/s MV E/A ratio:  1.41 Eleonore Chiquito MD Electronically signed by Eleonore Chiquito MD Signature Date/Time: 06/04/2022/5:22:12 PM    Final    DG Chest 2 View  Result Date: 06/03/2022 CLINICAL DATA:  Chest pain with body aches and fatigue. EXAM: CHEST - 2 VIEW COMPARISON:  June 02, 2022 FINDINGS: The cardiac silhouette is moderately enlarged and unchanged in size. There is mild stable perihilar prominence of the pulmonary vasculature. There is no evidence of an acute  infiltrate, pleural effusion or pneumothorax. The visualized skeletal structures are unremarkable. IMPRESSION: Stable cardiomegaly with findings suggestive of mild pulmonary vascular congestion. Electronically Signed   By: Virgina Norfolk M.D.   On: 06/03/2022 23:36     Scheduled Meds:  aspirin EC  81 mg Oral Daily   atorvastatin  80 mg Oral q1800   carvedilol  9.375 mg Oral BID WC   dapagliflozin propanediol  10 mg Oral Daily   ezetimibe  10 mg Oral Daily   polyethylene glycol  17 g Oral Daily   sacubitril-valsartan  1 tablet Oral BID   senna-docusate  1 tablet Oral BID   spironolactone  25 mg Oral Daily   Continuous Infusions:  cefTRIAXone (ROCEPHIN)  IV 1 g (06/04/22 1448)   heparin 2,000 Units/hr (06/05/22 1131)     LOS: 1 day    Time spent:19min    Domenic Polite, MD [facility]   06/05/2022, 1:26 PM

## 2022-06-05 NOTE — Care Management (Signed)
  Transition of Care [facility]) Screening Note   Patient Details  Name: Reginald Fernandez Date of Birth: 1980/05/11   Transition of Care [facility]) CM/SW Contact:    Bethena Roys, RN Phone Number: 06/05/2022, 4:02 PM    Transition of Care Department [facility]) has reviewed the patient and no TOC needs have been identified at this time. Patient presented for chest, abdominal, and flank pain. CT reflected positive for PE. Benefits check submitted for Xarelto and Eliquis. We will continue to monitor patient advancement through interdisciplinary progression rounds. If new patient transition needs arise, please place a TOC consult.

## 2022-06-05 NOTE — TOC Benefit Eligibility Note (Signed)
Patient Teacher, English as a foreign language completed.    The patient is currently admitted and upon discharge could be taking Xarelto 20 mg.  The current 30 day co-pay is $558.74 due to a $1,000.00 deductible.   The patient is currently admitted and upon discharge could be taking Eliquis 5 mg.  The current 30 day co-pay is $583.04 due to a $1,000.00 deductible.   The patient is insured through [employer] [place] [employer] [place], [name] Advocate Team Direct Number: [phone]  Fax: [phone]

## 2022-06-05 NOTE — Progress Notes (Addendum)
Rounding Note    Patient Name: Reginald Fernandez Date of Encounter: 06/05/2022  [place] Cardiologist: Glenetta Hew, MD   Subjective   Patient lying in bed complaining of abdominal and flank pain.  Denies any chest pain, dizziness, shortness of breath, leg swelling.  Inpatient Medications    Scheduled Meds:  aspirin EC  81 mg Oral Daily   atorvastatin  80 mg Oral q1800   carvedilol  9.375 mg Oral BID WC   dapagliflozin propanediol  10 mg Oral Daily   ezetimibe  10 mg Oral Daily   furosemide  20 mg Intravenous BID   sacubitril-valsartan  1 tablet Oral BID   spironolactone  25 mg Oral Daily   Continuous Infusions:  cefTRIAXone (ROCEPHIN)  IV 1 g (06/04/22 1448)   heparin 1,700 Units/hr (06/05/22 0234)   PRN Meds: acetaminophen **OR** acetaminophen, dicyclomine, docusate sodium, levalbuterol, magnesium hydroxide, morphine injection, nitroGLYCERIN, nitroGLYCERIN, ondansetron **OR** ondansetron (ZOFRAN) IV, ondansetron (ZOFRAN) IV   Vital Signs    Vitals:   06/04/22 1805 06/04/22 2100 06/04/22 2154 06/05/22 0242  BP: 116/83 105/79 106/79 111/81  Pulse: 96 84  84  Resp:  18  16  Temp: 97.7 F (36.5 C) 97.9 F (36.6 C)    TempSrc: Oral Oral    SpO2:  99%  93%  Weight:      Height:        Intake/Output Summary (Last 24 hours) at 06/05/2022 0915 Last data filed at 06/05/2022 0700 Gross per 24 hour  Intake 957.38 ml  Output 750 ml  Net 207.38 ml      06/03/2022    8:36 PM 06/02/2022    8:40 PM 11/14/2021   11:09 AM  Last 3 Weights  Weight (lbs) 240 lb 243 lb 262 lb 12.8 oz  Weight (kg) 108.863 kg 110.224 kg 119.205 kg      Telemetry    NSR - Personally Reviewed  ECG    NSR with biatrial enlargment, R axis deviation, Q waves laterally and inferiorly with some PVCs - Personally Reviewed  Physical Exam  GEN: In apparent pain Neck: No JVD Cardiac: RRR, no murmurs, rubs, or gallops.  Respiratory: Clear to auscultation bilaterally. GI: + TTP,  slight guarding MS: No edema; No deformity. Neuro:  Nonfocal  Psych: Normal affect   Labs    High Sensitivity Troponin:   Recent Labs  Lab 06/02/22 2142 06/02/22 2348 06/03/22 2210 06/03/22 2332  TROPONINIHS 27* 30* 220* 44*     Chemistry Recent Labs  Lab 06/02/22 2047 06/03/22 2020 06/04/22 0149 06/04/22 0420 06/05/22 0151  NA 136   < > 136 138 131*  K 4.1   < > 4.2 4.0 3.7  CL 104   < > 101 101 95*  CO2 24   < > 24 26 23   GLUCOSE 139*   < > 134* 107* 129*  BUN 17   < > 11 10 13   CREATININE 1.03   < > 1.09 1.16 1.13  CALCIUM 8.3*   < > 8.6* 8.4* 8.2*  MG  --    < > 2.0 1.9 2.2  PROT 6.9  --  7.0  --   --   ALBUMIN 3.5  --  3.7  --   --   AST 25  --  29  --   --   ALT 27  --  31  --   --   ALKPHOS 59  --  59  --   --  BILITOT 2.1*  --  3.5*  --   --   GFRNONAA >60   < > >60 >60 >60  ANIONGAP 8   < > 11 11 13    < > = values in this interval not displayed.    Lipids  Recent Labs  Lab 06/04/22 0149  CHOL 176  TRIG 72  HDL 27*  LDLCALC 135*  CHOLHDL 6.5    Hematology Recent Labs  Lab 06/03/22 2020 06/04/22 0149 06/05/22 0151  WBC 11.1* 12.1* 12.9*  RBC 4.88 4.98 5.81  HGB 15.1 15.3 18.1*  HCT 44.5 46.1 51.9  MCV 91.2 92.6 89.3  MCH 30.9 30.7 31.2  MCHC 33.9 33.2 34.9  RDW 12.9 13.0 13.0  PLT 228 240 240   Thyroid No results for input(s): "TSH", "FREET4" in the last 168 hours.  BNP Recent Labs  Lab 06/02/22 2047 06/03/22 2332  BNP 1,359.4* 745.9*    DDimer No results for input(s): "DDIMER" in the last 168 hours.   Radiology    CT ABDOMEN PELVIS W CONTRAST  Result Date: 06/05/2022 CLINICAL DATA:  Hemoptysis; Abdominal pain, acute, nonlocalized EXAM: CT ANGIOGRAPHY CHEST CT ABDOMEN AND PELVIS WITH CONTRAST TECHNIQUE: Multidetector CT imaging of the chest was performed using the standard protocol during bolus administration of intravenous contrast. Multiplanar CT image reconstructions and MIPs were obtained to evaluate the vascular anatomy.  Multidetector CT imaging of the abdomen and pelvis was performed using the standard protocol during bolus administration of intravenous contrast. RADIATION DOSE REDUCTION: This exam was performed according to the departmental dose-optimization program which includes automated exposure control, adjustment of the mA and/or kV according to patient size and/or use of iterative reconstruction technique. CONTRAST:  164mL OMNIPAQUE IOHEXOL 350 MG/ML SOLN COMPARISON:  CT abdomen pelvis 06/03/2022 FINDINGS: CTA CHEST FINDINGS Cardiovascular: There is adequate opacification of the pulmonary arterial tree. There are multiple branching intraluminal filling defects identified within the segmental pulmonary arteries of the a right upper lobe and posterior basal lower lobes bilaterally in keeping with acute pulmonary embolism. The embolic burden is moderate. The central pulmonary arteries are enlarged in keeping with changes of pulmonary arterial hypertension. There is no CT evidence of right heart strain with the RV/LV ratio measuring 0.9. Mild cardiomegaly is present with biventricular enlargement. Stenting of the left circumflex and right coronary arteries has been performed. Moderate multi-vessel coronary artery calcification. No pericardial effusion. No significant aortic atherosclerotic calcification or aneurysmal dilation. Aberrant origin of the right subclavian artery noted. Mediastinum/Nodes: No enlarged mediastinal, hilar, or axillary lymph nodes. Thyroid gland, trachea, and esophagus demonstrate no significant findings. Lungs/Pleura: Patchy infiltrate within the posterior basal right lower lobe is nonspecific, possibly inflammatory or hemorrhagic in the setting of a small pulmonary infarct. The lungs are otherwise clear. No pneumothorax or pleural effusion. No central obstructing lesion. Musculoskeletal: No acute bone abnormality. No lytic or blastic bone lesion. Review of the MIP images confirms the above findings. CT  ABDOMEN and PELVIS FINDINGS Hepatobiliary: No focal liver abnormality is seen. No gallstones, gallbladder wall thickening, or biliary dilatation. Pancreas: Unremarkable Spleen: Unremarkable Adrenals/Urinary Tract: The adrenal glands are unremarkable. The kidneys are normal in size and position. Mild cortical scarring noted bilaterally. There is heterogeneous cortical enhancement again identified within the kidneys bilaterally which may reflect a superimposed inflammatory process such as pyelonephritis. However, the area of more focal hypoenhancement within the lower pole of the left kidney now demonstrates frank non enhancement and is more compatible with an acute renal infarct. This may represent  a infarct related to a paradoxical embolus given the presence of acute pulmonary embolism described above. Contrast staining noted within the infarcted region. No hydronephrosis. No intrarenal or ureteral calculi. The bladder is unremarkable. Stomach/Bowel: Stomach is within normal limits. Appendix appears normal. No evidence of bowel wall thickening, distention, or inflammatory changes. Vascular/Lymphatic: Aortic atherosclerosis. No enlarged abdominal or pelvic lymph nodes. Reproductive: Mild prostatic hypertrophy. Other: No abdominal wall hernia or abnormality. No abdominopelvic ascites. Musculoskeletal: No acute bone abnormality. No lytic or blastic bone lesion. Review of the MIP images confirms the above findings. IMPRESSION: 1. Acute pulmonary embolism with moderate embolic burden. No CT evidence of right heart strain. 2. Morphologic changes in keeping with pulmonary arterial hypertension. 3. Mild cardiomegaly with biventricular enlargement. Moderate multi-vessel coronary artery calcification. 4. Patchy infiltrate within the posterior basal right lower lobe, nonspecific, possibly inflammatory or hemorrhagic in the setting of a small pulmonary infarct. 5. Heterogeneous cortical enhancement within the kidneys  bilaterally which may reflect a superimposed inflammatory process such as pyelonephritis. However, the area of more focal hypoenhancement within the lower pole of the left kidney now demonstrates frank non enhancement and is more compatible with an evolving acute renal infarct. This may represent an infarct related to a paradoxical embolus given the presence of acute pulmonary embolism described above and echocardiography may be helpful to assess for the presence of an intracardiac shunt Aortic Atherosclerosis (ICD10-I70.0). Electronically Signed   By: Fidela Salisbury M.D.   On: 06/05/2022 01:23   CT Angio Chest Pulmonary Embolism (PE) W or WO Contrast  Result Date: 06/05/2022 CLINICAL DATA:  Hemoptysis; Abdominal pain, acute, nonlocalized EXAM: CT ANGIOGRAPHY CHEST CT ABDOMEN AND PELVIS WITH CONTRAST TECHNIQUE: Multidetector CT imaging of the chest was performed using the standard protocol during bolus administration of intravenous contrast. Multiplanar CT image reconstructions and MIPs were obtained to evaluate the vascular anatomy. Multidetector CT imaging of the abdomen and pelvis was performed using the standard protocol during bolus administration of intravenous contrast. RADIATION DOSE REDUCTION: This exam was performed according to the departmental dose-optimization program which includes automated exposure control, adjustment of the mA and/or kV according to patient size and/or use of iterative reconstruction technique. CONTRAST:  145mL OMNIPAQUE IOHEXOL 350 MG/ML SOLN COMPARISON:  CT abdomen pelvis 06/03/2022 FINDINGS: CTA CHEST FINDINGS Cardiovascular: There is adequate opacification of the pulmonary arterial tree. There are multiple branching intraluminal filling defects identified within the segmental pulmonary arteries of the a right upper lobe and posterior basal lower lobes bilaterally in keeping with acute pulmonary embolism. The embolic burden is moderate. The central pulmonary arteries are  enlarged in keeping with changes of pulmonary arterial hypertension. There is no CT evidence of right heart strain with the RV/LV ratio measuring 0.9. Mild cardiomegaly is present with biventricular enlargement. Stenting of the left circumflex and right coronary arteries has been performed. Moderate multi-vessel coronary artery calcification. No pericardial effusion. No significant aortic atherosclerotic calcification or aneurysmal dilation. Aberrant origin of the right subclavian artery noted. Mediastinum/Nodes: No enlarged mediastinal, hilar, or axillary lymph nodes. Thyroid gland, trachea, and esophagus demonstrate no significant findings. Lungs/Pleura: Patchy infiltrate within the posterior basal right lower lobe is nonspecific, possibly inflammatory or hemorrhagic in the setting of a small pulmonary infarct. The lungs are otherwise clear. No pneumothorax or pleural effusion. No central obstructing lesion. Musculoskeletal: No acute bone abnormality. No lytic or blastic bone lesion. Review of the MIP images confirms the above findings. CT ABDOMEN and PELVIS FINDINGS Hepatobiliary: No focal liver abnormality is seen.  No gallstones, gallbladder wall thickening, or biliary dilatation. Pancreas: Unremarkable Spleen: Unremarkable Adrenals/Urinary Tract: The adrenal glands are unremarkable. The kidneys are normal in size and position. Mild cortical scarring noted bilaterally. There is heterogeneous cortical enhancement again identified within the kidneys bilaterally which may reflect a superimposed inflammatory process such as pyelonephritis. However, the area of more focal hypoenhancement within the lower pole of the left kidney now demonstrates frank non enhancement and is more compatible with an acute renal infarct. This may represent a infarct related to a paradoxical embolus given the presence of acute pulmonary embolism described above. Contrast staining noted within the infarcted region. No hydronephrosis. No  intrarenal or ureteral calculi. The bladder is unremarkable. Stomach/Bowel: Stomach is within normal limits. Appendix appears normal. No evidence of bowel wall thickening, distention, or inflammatory changes. Vascular/Lymphatic: Aortic atherosclerosis. No enlarged abdominal or pelvic lymph nodes. Reproductive: Mild prostatic hypertrophy. Other: No abdominal wall hernia or abnormality. No abdominopelvic ascites. Musculoskeletal: No acute bone abnormality. No lytic or blastic bone lesion. Review of the MIP images confirms the above findings. IMPRESSION: 1. Acute pulmonary embolism with moderate embolic burden. No CT evidence of right heart strain. 2. Morphologic changes in keeping with pulmonary arterial hypertension. 3. Mild cardiomegaly with biventricular enlargement. Moderate multi-vessel coronary artery calcification. 4. Patchy infiltrate within the posterior basal right lower lobe, nonspecific, possibly inflammatory or hemorrhagic in the setting of a small pulmonary infarct. 5. Heterogeneous cortical enhancement within the kidneys bilaterally which may reflect a superimposed inflammatory process such as pyelonephritis. However, the area of more focal hypoenhancement within the lower pole of the left kidney now demonstrates frank non enhancement and is more compatible with an evolving acute renal infarct. This may represent an infarct related to a paradoxical embolus given the presence of acute pulmonary embolism described above and echocardiography may be helpful to assess for the presence of an intracardiac shunt Aortic Atherosclerosis (ICD10-I70.0). Electronically Signed   By: Fidela Salisbury M.D.   On: 06/05/2022 01:23   ECHOCARDIOGRAM COMPLETE  Result Date: 06/04/2022    ECHOCARDIOGRAM REPORT   Patient Name:   Reginald Fernandez Date of Exam: 06/04/2022 Medical Rec #:  [id number]      Height:       74.0 in Accession #:    [id number]     Weight:       240.0 lb Date of Birth:  April 07, 1980      BSA:          2.349 m  Patient Age:    55 years       BP:           111/81 mmHg Patient Gender: M              HR:           100 bpm. Exam Location:  Inpatient Procedure: 2D Echo, Cardiac Doppler, Color Doppler and Intracardiac            Opacification Agent Indications:    NSTEMI I21.4  History:        Patient has prior history of Echocardiogram examinations, most                 recent 10/10/2021. Cardiomyopathy, CAD and Previous Myocardial                 Infarction; Risk Factors:Hypertension and Dyslipidemia. S/P                 percutaneous coronary angioplasty 11/20/2013.  Sonographer:  Wilkie Aye RVT RCS Referring Phys: [id number] Love  1. Left ventricular ejection fraction, by estimation, is <20%. The left ventricle has severely decreased function. The left ventricle demonstrates global hypokinesis. The left ventricular internal cavity size was severely dilated. Left ventricular diastolic parameters are consistent with Grade II diastolic dysfunction (pseudonormalization).  2. Right ventricular systolic function is severely reduced. The right ventricular size is moderately enlarged. Tricuspid regurgitation signal is inadequate for assessing PA pressure.  3. Left atrial size was mildly dilated.  4. Right atrial size was mildly dilated.  5. The mitral valve is grossly normal. Trivial mitral valve regurgitation. No evidence of mitral stenosis.  6. The aortic valve is tricuspid. Aortic valve regurgitation is not visualized. No aortic stenosis is present.  7. The inferior vena cava is normal in size with greater than 50% respiratory variability, suggesting right atrial pressure of 3 mmHg. Comparison(s): No significant change from prior study. Conclusion(s)/Recommendation(s): No left ventricular mural or apical thrombus/thrombi. FINDINGS  Left Ventricle: Left ventricular ejection fraction, by estimation, is <20%. The left ventricle has severely decreased function. The left ventricle demonstrates global  hypokinesis. Definity contrast agent was given IV to delineate the left ventricular endocardial borders. The left ventricular internal cavity size was severely dilated. There is no left ventricular hypertrophy. Left ventricular diastolic parameters are consistent with Grade II diastolic dysfunction (pseudonormalization). Right Ventricle: The right ventricular size is moderately enlarged. No increase in right ventricular wall thickness. Right ventricular systolic function is severely reduced. Tricuspid regurgitation signal is inadequate for assessing PA pressure. Left Atrium: Left atrial size was mildly dilated. Right Atrium: Right atrial size was mildly dilated. Pericardium: There is no evidence of pericardial effusion. Mitral Valve: The mitral valve is grossly normal. Trivial mitral valve regurgitation. No evidence of mitral valve stenosis. Tricuspid Valve: The tricuspid valve is grossly normal. Tricuspid valve regurgitation is trivial. No evidence of tricuspid stenosis. Aortic Valve: The aortic valve is tricuspid. Aortic valve regurgitation is not visualized. No aortic stenosis is present. Aortic valve mean gradient measures 2.0 mmHg. Aortic valve peak gradient measures 3.0 mmHg. Aortic valve area, by VTI measures 3.42 cm. Pulmonic Valve: The pulmonic valve was grossly normal. Pulmonic valve regurgitation is trivial. No evidence of pulmonic stenosis. Aorta: The aortic root and ascending aorta are structurally normal, with no evidence of dilitation. Venous: The inferior vena cava is normal in size with greater than 50% respiratory variability, suggesting right atrial pressure of 3 mmHg. IAS/Shunts: The atrial septum is grossly normal.  LEFT VENTRICLE PLAX 2D LVIDd:         6.90 cm   Diastology LVIDs:         5.80 cm   LV e' medial:    4.60 cm/s LV PW:         1.30 cm   LV E/e' medial:  13.2 LV IVS:        1.10 cm   LV e' lateral:   6.46 cm/s LVOT diam:     2.20 cm   LV E/e' lateral: 9.4 LV SV:         41 LV SV  Index:   18 LVOT Area:     3.80 cm  RIGHT VENTRICLE             IVC RV Basal diam:  4.30 cm     IVC diam: 1.20 cm RV Mid diam:    3.40 cm RV S prime:     10.70 cm/s TAPSE (M-mode): 2.1 cm LEFT  ATRIUM             Index        RIGHT ATRIUM           Index LA diam:        5.10 cm 2.17 cm/m   RA Area:     24.00 cm LA Vol (A2C):   94.5 ml 40.24 ml/m  RA Volume:   81.70 ml  34.79 ml/m LA Vol (A4C):   86.4 ml 36.79 ml/m LA Biplane Vol: 91.9 ml 39.13 ml/m  AORTIC VALVE                    PULMONIC VALVE AV Area (Vmax):    3.37 cm     PV Vmax:       0.68 m/s AV Area (Vmean):   3.37 cm     PV Peak grad:  1.8 mmHg AV Area (VTI):     3.42 cm AV Vmax:           87.20 cm/s AV Vmean:          67.800 cm/s AV VTI:            0.121 m AV Peak Grad:      3.0 mmHg AV Mean Grad:      2.0 mmHg LVOT Vmax:         77.40 cm/s LVOT Vmean:        60.100 cm/s LVOT VTI:          0.109 m LVOT/AV VTI ratio: 0.90  AORTA Ao Root diam: 3.50 cm Ao Asc diam:  3.50 cm Ao Arch diam: 3.4 cm MITRAL VALVE MV Area (PHT): 6.60 cm    SHUNTS MV Decel Time: 115 msec    Systemic VTI:  0.11 m MV E velocity: 60.80 cm/s  Systemic Diam: 2.20 cm MV A velocity: 43.00 cm/s MV E/A ratio:  1.41 Eleonore Chiquito MD Electronically signed by Eleonore Chiquito MD Signature Date/Time: 06/04/2022/5:22:12 PM    Final    DG Chest 2 View  Result Date: 06/03/2022 CLINICAL DATA:  Chest pain with body aches and fatigue. EXAM: CHEST - 2 VIEW COMPARISON:  June 02, 2022 FINDINGS: The cardiac silhouette is moderately enlarged and unchanged in size. There is mild stable perihilar prominence of the pulmonary vasculature. There is no evidence of an acute infiltrate, pleural effusion or pneumothorax. The visualized skeletal structures are unremarkable. IMPRESSION: Stable cardiomegaly with findings suggestive of mild pulmonary vascular congestion. Electronically Signed   By: Virgina Norfolk M.D.   On: 06/03/2022 23:36    Cardiac Studies   Echocardiogram 06/04/2022 Left Ventricle:  Left ventricular ejection fraction, by estimation, is  <20%. The left ventricle has severely decreased function. The left  ventricle demonstrates global hypokinesis. Definity contrast agent was  given IV to delineate the left ventricular  endocardial borders. The left ventricular internal cavity size was  severely dilated. There is no left ventricular hypertrophy. Left  ventricular diastolic parameters are consistent with Grade II diastolic  dysfunction (pseudonormalization).   Right Ventricle: The right ventricular size is moderately enlarged. No  increase in right ventricular wall thickness. Right ventricular systolic  function is severely reduced. Tricuspid regurgitation signal is inadequate  for assessing PA pressure.   Left Atrium: Left atrial size was mildly dilated.   Right Atrium: Right atrial size was mildly dilated.   Pericardium: There is no evidence of pericardial effusion.   Mitral Valve: The mitral valve is grossly normal. Trivial mitral valve  regurgitation. No evidence of mitral valve stenosis.   Tricuspid Valve: The tricuspid valve is grossly normal. Tricuspid valve  regurgitation is trivial. No evidence of tricuspid stenosis.   Aortic Valve: The aortic valve is tricuspid. Aortic valve regurgitation is  not visualized. No aortic stenosis is present. Aortic valve mean gradient  measures 2.0 mmHg. Aortic valve peak gradient measures 3.0 mmHg. Aortic  valve area, by VTI measures 3.42  cm.   Pulmonic Valve: The pulmonic valve was grossly normal. Pulmonic valve  regurgitation is trivial. No evidence of pulmonic stenosis.   Aorta: The aortic root and ascending aorta are structurally normal, with  no evidence of dilitation.   Venous: The inferior vena cava is normal in size with greater than 50%  respiratory variability, suggesting right atrial pressure of 3 mmHg.   IAS/Shunts: The atrial septum is grossly normal.   Right and left heart catheterization  10/11/2021   1st Diag lesion is 60% stenosed.   Prox RCA-1 lesion is 55% stenosed.   Mid RCA lesion is 40% stenosed.   Prox RCA-2 lesion is 30% stenosed.   Mid LAD lesion is 40% stenosed.   Prox Cx to Dist Cx lesion is 50% stenosed.   Non-stenotic Prox Cx lesion was previously treated.   Non-stenotic Prox RCA to Mid RCA lesion was previously treated.   Non-stenotic Dist RCA lesion was previously treated.   Non-stenotic 1st Mrg lesion was previously treated.   1. Mildly elevated PCWP with pulmonary venous hypertension.  2. Normal RA pressure.  3. Preserved cardiac output.  4. Diffuse CAD but no severe stenoses that would explain worsening of EF.     Patient Profile     42 y.o. male with a hx of HTN, combined systolic and diastolic HF, EF [id number] with global hypokinesis, CAD s/p multiple MI with several DES, obesity, fatty liver, former smoker who presented to the ED 3/16 initially for SOB and abdominal pain then discharged for acute on chronic CHF exacerbation with plan to diurese. He returned to ED on 3/17 for dehydration concerns. Now with new diagnosis of pulmonary embolism found on 06/04/2022 with suspected pulmonary infarct in the right lower lobe with no RV strain and acute pyelonephritis. Assessment & Plan   Elevate troponin CAD s/p LCX stents in 2015 HLD Patient initially placed on heparin drip for concerns of NSTEMI with troponins of 220.  However troponins had decreased to 44 suggesting not ACS and heparin was stopped.  Suspected to be more demand ischemia from HF. Now currently back on heparin due to new diagnosis of pulmonary embolism. Recent cardiac catheterization noted diffuse coronary artery disease, and patent stents, but no severe stenosis that would explain worsening EF.  Plan is to continue medical therapy and follow-up with outpatient. Initially was discontinued on heparin drip however now is back on due to new diagnosis of pulmonary embolism. Continue aspirin 81 mg,  ezetimibe 10mg , and atorvastatin 80mg  Nitroglycerin as needed Has not complained of chest pain this admission  Severe chronic biventricular heart failure  HTN Severely reduced EF less than 20% with global hypokinesis and unchanged EF when comparing echo during this admission and previous echo on 09/2021.  Plan was to consider ICD if continued suboptimal EF despite maximizing medical therapy.   Echo on 06/04/2022 noted EF less than 20%.  LV has severely decreased function and global hypokinesis with grade 2 diastolic dysfunction.  RV systolic function was severely reduced. Euvolemic on exam without any complaints of shortness of breath or swelling  Continue on Coreg 9.735 mg BID, Entresto 43-51 mg daily, Aldactone 25 mg daily, Farxiga 10 mg dialy  Continue furosemide 20 mg daily  Plan to follow heart failure clinic at discharge.  Pulmonary embolism  Newly diagnosed 06/04/2022 Not complaining of any shortness of breath, vital signs stable, not requiring oxygen Primary team with plan to evaluate for DVT Currently back on heparin  Acute pyelonephritis Patient complaining of diffuse abdominal and flank pain on ceftriaxone   For questions or updates, please contact [place] Please consult [email or web address] for contact info under        Signed, Bonnee Quin, PA-C  06/05/2022, 9:15 AM    I have examined the patient and reviewed assessment and plan and discussed with patient.  Agree with above as stated.    IV heparin for PE. Will need longer term anticoagulation.  Will need CHF management.  His insurance issues appear more settled.  WIll have CHF clinic try to arrange f/u and meds.  He was lost to f/u in their clinic in 2023.   Larae Grooms

## 2022-06-05 NOTE — Progress Notes (Signed)
[place] for heparin Indication: pulmonary embolus  Allergies  Allergen Reactions   Penicillins Other (See Comments)    Childhood allergy Unknown reaction  Did it involve swelling of the face/tongue/throat, SOB, or low BP? No Did it involve sudden or severe rash/hives, skin peeling, or any reaction on the inside of your mouth or nose? No Did you need to seek medical attention at a hospital or doctor's office? No When did it last happen?      child If all above answers are "NO", may proceed with cephalosporin use.    Patient Measurements: Height: 6\' 2"  (188 cm) Weight: 108.9 kg (240 lb) IBW/kg (Calculated) : 82.2 Heparin Dosing Weight: 105kg  Vital Signs: BP: 111/81 (03/19 0242) Pulse Rate: 84 (03/19 0242)  Labs: Recent Labs    06/02/22 2348 06/03/22 2020 06/03/22 2210 06/03/22 2332 06/04/22 0149 06/04/22 0420 06/04/22 0611 06/05/22 0151 06/05/22 0903  HGB  --  15.1  --   --  15.3  --   --  18.1*  --   HCT  --  44.5  --   --  46.1  --   --  51.9  --   PLT  --  228  --   --  240  --   --  240  --   HEPARINUNFRC  --   --   --   --   --   --  0.11*  --  0.17*  CREATININE  --  1.24  --   --  1.09 1.16  --  1.13  --   TROPONINIHS 30*  --  220* 44*  --   --   --   --   --      Estimated Creatinine Clearance: 113 mL/min (by C-G formula based on SCr of 1.13 mg/dL).   Medical History: Past Medical History:  Diagnosis Date   CAD S/P percutaneous coronary angioplasty 11/20/2013   a. 09/2009: EKG with Inf STEMI - no obstructive CAD;  b. 11/2013 Inflat STEMI/PCI: LM nl, LAD 20p, D1 sm - diff dzs, D2 large - nl, LCX 95-99, OM1 100 (3.5x38 Promus Premier DES), RCA 95-7m, 80d (3.0x20 and 3.0x16 Promus Premier DES') - normal EF;  c. NSTEMI 5/18 - patent stents, otw minimal CAD.- EF by Echo 35-40%; d. NSTEMI 10/2018 - ? culprit - Small branch of  D2,Med Rx,.  EF by Echo ~25%   Cardiomyopathy, ischemic 07/2016   h/o Inf STEMI 11/2013 - EF was  "Normal"; b) NSTEMI 07/2016 (NO Cultprit on Cath) - Echo EF 35-40% (Basal Ant-Lat HK, basal-mid Inferolateral & apical Akinesis); c) NSTEMI 10/2018 -felt to be occluded small branch of D2, Echo EF further decreased to 20-25%.    Essential hypertension    Hyperlipidemia with target LDL less than 100    Marijuana abuse    Migraine    Morbid obesity (HCC)    ST-segment elevation myocardial infarction (STEMI) of inferior wall ([place]) 11/20/2013    Medications:  Medications Prior to Admission  Medication Sig Dispense Refill Last Dose   acetaminophen (TYLENOL) 500 MG tablet Take 1,000 mg by mouth 2 (two) times daily as needed for moderate pain or headache.   Past Week   aspirin EC 81 MG tablet Take 1 tablet (81 mg total) by mouth daily. 30 tablet 5 06/02/2022 at am   atorvastatin (LIPITOR) 80 MG tablet Take 1 tablet (80 mg total) by mouth daily at 6 PM 90 tablet 3 06/02/2022 at  am   carvedilol (COREG) 6.25 MG tablet Take 1.5 tablets (9.375 mg total) by mouth 2 (two) times daily with a meal. 90 tablet 5 06/02/2022 at 1000   dapagliflozin propanediol (FARXIGA) 10 MG TABS tablet Take 1 tablet (10 mg total) by mouth daily. 30 tablet 5 06/02/2022 at am   ezetimibe (ZETIA) 10 MG tablet Take 1 tablet (10 mg total) by mouth daily. 30 tablet 5 06/02/2022   furosemide (LASIX) 20 MG tablet Take 1 tablet (20 mg total) by mouth daily. 30 tablet 5 06/03/2022 at 0800   nitroGLYCERIN (NITROSTAT) 0.4 MG SL tablet Place 1 tablet (0.4 mg total) under the tongue every 5 (five) minutes as needed for chest pain. 25 tablet 2 UNKNOWN   sacubitril-valsartan (ENTRESTO) 49-51 MG Take 1 tablet by mouth 2 (two) times daily. 180 tablet 3 06/03/2022 at 0800   spironolactone (ALDACTONE) 25 MG tablet Take 1 tablet (25 mg total) by mouth daily. 30 tablet 5 06/02/2022 at am   Scheduled:   aspirin EC  81 mg Oral Daily   atorvastatin  80 mg Oral q1800   carvedilol  9.375 mg Oral BID WC   dapagliflozin propanediol  10 mg Oral Daily   ezetimibe   10 mg Oral Daily   polyethylene glycol  17 g Oral Daily   sacubitril-valsartan  1 tablet Oral BID   senna-docusate  1 tablet Oral BID   spironolactone  25 mg Oral Daily   Infusions:   cefTRIAXone (ROCEPHIN)  IV 1 g (06/04/22 1448)   heparin 1,700 Units/hr (06/05/22 0234)    Assessment: 42yo male admitted 3/17 for CP and started on heparin which was d/c'd when it was thought that the troponin trend was d/t HF exacerbation; c/o acute abdominal pain 3/18 pm associated w/ straining for BM; he then coughed up a quarter-sized amount of blood >> pt sent for CTA which reveals PE and renal infarct possibly d/t paradoxical embolism >> on IV heparin -heparin level= 0.17 on 1700 units/hr -Hg= 18.1, plt= 240  Goal of Therapy:  Heparin level 0.3-0.7 units/ml Monitor platelets by anticoagulation protocol: Yes   Plan:  -Heparin bolus 3000 units x1 then increase infusion to 2000 units/hr -Heparin level in 6 hours and daily wth CBC daily  Hildred Laser, PharmD Clinical Pharmacist **Pharmacist phone directory can now be found on [email or web address] ([email or web address]).  Listed under [place].

## 2022-06-05 NOTE — Progress Notes (Signed)
Pt resting in bed, reports abdominal pain 6/10.  IV heparin was started per orders.  BP 111/81, sats 95% on RA.  Denies any chest pain or shortness of breath.  PRN medication administered for pain.

## 2022-06-05 NOTE — Progress Notes (Signed)
ANTICOAGULATION CONSULT NOTE - Initial Consult  Pharmacy Consult for heparin Indication: pulmonary embolus  Allergies  Allergen Reactions   Penicillins Other (See Comments)    Childhood allergy Unknown reaction  Did it involve swelling of the face/tongue/throat, SOB, or low BP? No Did it involve sudden or severe rash/hives, skin peeling, or any reaction on the inside of your mouth or nose? No Did you need to seek medical attention at a hospital or doctor's office? No When did it last happen?      child If all above answers are "NO", may proceed with cephalosporin use.    Patient Measurements: Height: 6\' 2"  (188 cm) Weight: 108.9 kg (240 lb) IBW/kg (Calculated) : 82.2 Heparin Dosing Weight: 105kg  Vital Signs: Temp: 97.9 F (36.6 C) (03/18 2100) Temp Source: Oral (03/18 2100) BP: 106/79 (03/18 2154) Pulse Rate: 84 (03/18 2100)  Labs: Recent Labs    06/02/22 2047 06/02/22 2142 06/02/22 2348 06/03/22 2020 06/03/22 2210 06/03/22 2332 06/04/22 0149 06/04/22 0420 06/04/22 0611  HGB 14.3  --   --  15.1  --   --  15.3  --   --   HCT 42.5  --   --  44.5  --   --  46.1  --   --   PLT 236  --   --  228  --   --  240  --   --   HEPARINUNFRC  --   --   --   --   --   --   --   --  0.11*  CREATININE 1.03  --   --  1.24  --   --  1.09 1.16  --   TROPONINIHS  --    < > 30*  --  220* 44*  --   --   --    < > = values in this interval not displayed.    Estimated Creatinine Clearance: 110.1 mL/min (by C-G formula based on SCr of 1.16 mg/dL).   Medical History: Past Medical History:  Diagnosis Date   CAD S/P percutaneous coronary angioplasty 11/20/2013   a. 09/2009: EKG with Inf STEMI - no obstructive CAD;  b. 11/2013 Inflat STEMI/PCI: LM nl, LAD 20p, D1 sm - diff dzs, D2 large - nl, LCX 95-99, OM1 100 (3.5x38 Promus Premier DES), RCA 95-88m, 80d (3.0x20 and 3.0x16 Promus Premier DES') - normal EF;  c. NSTEMI 5/18 - patent stents, otw minimal CAD.- EF by Echo 35-40%; d. NSTEMI  10/2018 - ? culprit - Small branch of  D2,Med Rx,.  EF by Echo ~25%   Cardiomyopathy, ischemic 07/2016   h/o Inf STEMI 11/2013 - EF was "Normal"; b) NSTEMI 07/2016 (NO Cultprit on Cath) - Echo EF 35-40% (Basal Ant-Lat HK, basal-mid Inferolateral & apical Akinesis); c) NSTEMI 10/2018 -felt to be occluded small branch of D2, Echo EF further decreased to 20-25%.    Essential hypertension    Hyperlipidemia with target LDL less than 100    Marijuana abuse    Migraine    Morbid obesity (HCC)    ST-segment elevation myocardial infarction (STEMI) of inferior wall ([place]) 11/20/2013    Medications:  Medications Prior to Admission  Medication Sig Dispense Refill Last Dose   acetaminophen (TYLENOL) 500 MG tablet Take 1,000 mg by mouth 2 (two) times daily as needed for moderate pain or headache.   Past Week   aspirin EC 81 MG tablet Take 1 tablet (81 mg total) by mouth daily. 30 tablet 5  06/02/2022 at am   atorvastatin (LIPITOR) 80 MG tablet Take 1 tablet (80 mg total) by mouth daily at 6 PM 90 tablet 3 06/02/2022 at am   carvedilol (COREG) 6.25 MG tablet Take 1.5 tablets (9.375 mg total) by mouth 2 (two) times daily with a meal. 90 tablet 5 06/02/2022 at 1000   dapagliflozin propanediol (FARXIGA) 10 MG TABS tablet Take 1 tablet (10 mg total) by mouth daily. 30 tablet 5 06/02/2022 at am   ezetimibe (ZETIA) 10 MG tablet Take 1 tablet (10 mg total) by mouth daily. 30 tablet 5 06/02/2022   furosemide (LASIX) 20 MG tablet Take 1 tablet (20 mg total) by mouth daily. 30 tablet 5 06/03/2022 at 0800   nitroGLYCERIN (NITROSTAT) 0.4 MG SL tablet Place 1 tablet (0.4 mg total) under the tongue every 5 (five) minutes as needed for chest pain. 25 tablet 2 UNKNOWN   sacubitril-valsartan (ENTRESTO) 49-51 MG Take 1 tablet by mouth 2 (two) times daily. 180 tablet 3 06/03/2022 at 0800   spironolactone (ALDACTONE) 25 MG tablet Take 1 tablet (25 mg total) by mouth daily. 30 tablet 5 06/02/2022 at am   Scheduled:   aspirin EC  81 mg Oral  Daily   atorvastatin  80 mg Oral q1800   carvedilol  9.375 mg Oral BID WC   dapagliflozin propanediol  10 mg Oral Daily   enoxaparin (LOVENOX) injection  40 mg Subcutaneous Q24H   ezetimibe  10 mg Oral Daily   furosemide  20 mg Intravenous BID   sacubitril-valsartan  1 tablet Oral BID   spironolactone  25 mg Oral Daily   Infusions:   cefTRIAXone (ROCEPHIN)  IV 1 g (06/04/22 1448)   heparin      Assessment: 42yo male admitted 3/17 for CP and started on heparin which was d/c'd when it was thought that the troponin trend was d/t HF exacerbation; c/o acute abdominal pain 3/18 pm associated w/ straining for BM; he then coughed up a quarter-sized amount of blood >> pt sent for CTA which reveals PE and renal infarct possibly d/t paradoxical embolism >> to resume heparin.  Goal of Therapy:  Heparin level 0.3-0.7 units/ml Monitor platelets by anticoagulation protocol: Yes   Plan:  Small heparin bolus of 2000 units followed by infusion at 1700 units/hr. Monitor heparin levels and CBC.  Wynona Neat, PharmD, BCPS  06/05/2022,2:13 AM

## 2022-06-05 NOTE — Progress Notes (Signed)
Critical care note:  Date of note: 06/05/2022.  Subjective: Patient was seen around 10:30 PM on 06/05/2022.  The patient was having left lower quadrant abdominal pain graded 8/10 in severity with associated epigastric pain and radiation to his back.  His last normal bowel movement was 3 days ago.  He had a small bowel movement this p.m. and was straining.  He was given 2 mg of IV morphine with improvement of his pain that was down to 5/10.  He was also given milk of magnesia.  He vomited a large amount of yellowish liquid that had mild tinge of orange color to it as well.  He declined Zofran then.  A stat EKG was done and revealed normal sinus rhythm with a rate of 79, biatrial enlargement, right axis deviation, poor R wave progression and Q waves laterally and inferiorly with LVH.  He ambulated to the bathroom and coughed up a small amount of bright red blood that was a quarter size in the total.  No chest pain or palpitations.  No fever or chills.  Objective: Physical examination: Generally: Acutely ill middle-aged African-American male squatting on the ground and resting on his bed due to abdominal pain.  He had no respiratory distress. Vital signs: Showed most recent BP of 106/79 with heart rate of 84 and respiratory rate of 18, pulse oximetry of 99% on room air. Head - atraumatic, normocephalic.  Pupils - equal, round and reactive to light and accommodation. Extraocular movements are intact. No scleral icterus.  Oropharynx - moist mucous membranes and tongue. No pharyngeal erythema or exudate.  Neck - supple. No JVD. Carotid pulses 2+ bilaterally. No carotid bruits. No palpable thyromegaly or lymphadenopathy. Cardiovascular - regular rate and rhythm. Normal S1 and S2. No murmurs, gallops or rubs.  Lungs - clear to auscultation bilaterally with slightly diminished bibasal breath sounds.  Abdomen - soft and with epigastric and left lower quadrant abdominal tenderness without rebound tenderness  guarding or rigidity.Marland Kitchen Positive bowel sounds. No palpable organomegaly or masses.  Extremities - no pitting edema, clubbing or cyanosis.  Neuro - grossly non-focal. Skin - no rashes. GU and rectal exam - deferred.  Labs and notes were reviewed. Stat abdominal and pelvic CT scan with contrast as well as chest CTA revealed the following: 1. Acute pulmonary embolism with moderate embolic burden. No CT evidence of right heart strain. 2. Morphologic changes in keeping with pulmonary arterial hypertension. 3. Mild cardiomegaly with biventricular enlargement. Moderate multi-vessel coronary artery calcification. 4. Patchy infiltrate within the posterior basal right lower lobe, nonspecific, possibly inflammatory or hemorrhagic in the setting of a small pulmonary infarct. 5. Heterogeneous cortical enhancement within the kidneys bilaterally which may reflect a superimposed inflammatory process such as pyelonephritis. However, the area of more focal hypoenhancement within the lower pole of the left kidney now demonstrates frank non enhancement and is more compatible with an evolving acute renal infarct. This may represent an infarct related to a paradoxical embolus given the presence of acute pulmonary embolism described above and echocardiography may be helpful to assess for the presence of an intracardiac shunt 6.  Aortic atherosclerosis.    Assessment/plan: 1.  Acute pulmonary embolism with moderate embolic burden with suspected pulmonary infarct in the right lower lobe with no RV strain and suspected left lower renal pole infarction in addition to acute pyelonephritis.. - The patient will be placed on IV heparin. - Will obtain a 2D echo to assess for RV strain. - Will obtain bilateral lower extremity venous  Doppler to rule out DVT. - Pain management will be provided. - We will continue other current plan of care. - We will my proBNP and troponin I for prognostic values.  Authorized and  performed by: Eugenie Norrie, MD Total critical care time: Approximately   35    minutes. Due to a high probability of clinically significant, life-threatening deterioration, the patient required my highest level of preparedness to intervene emergently and I personally spent this critical care time directly and personally managing the patient.  This critical care time included obtaining a history, examining the patient, pulse oximetry, ordering and review of studies, arranging urgent treatment with development of management plan, evaluation of patient's response to treatment, frequent reassessment, and discussions with other providers. This critical care time was performed to assess and manage the high probability of imminent, life-threatening deterioration that could result in multiorgan failure.  It was exclusive of separately billable procedures and treating other patients and teaching time.

## 2022-06-05 NOTE — Progress Notes (Signed)
[place] for heparin Indication: pulmonary embolus  Allergies  Allergen Reactions   Penicillins Other (See Comments)    Childhood allergy Unknown reaction  Did it involve swelling of the face/tongue/throat, SOB, or low BP? No Did it involve sudden or severe rash/hives, skin peeling, or any reaction on the inside of your mouth or nose? No Did you need to seek medical attention at a hospital or doctor's office? No When did it last happen?      child If all above answers are "NO", may proceed with cephalosporin use.    Patient Measurements: Height: 6\' 2"  (188 cm) Weight: 107.2 kg (236 lb 6.4 oz) IBW/kg (Calculated) : 82.2 Heparin Dosing Weight: 105kg  Vital Signs: Temp: 97.6 F (36.4 C) (03/19 1605) Temp Source: Oral (03/19 1605) BP: 120/92 (03/19 1605) Pulse Rate: 89 (03/19 1605)  Labs: Recent Labs    06/02/22 2348 06/03/22 2020 06/03/22 2210 06/03/22 2332 06/04/22 0149 06/04/22 0420 06/04/22 [id number] 06/05/22 0151 06/05/22 0903 06/05/22 1733  HGB  --  15.1  --   --  15.3  --   --  18.1*  --   --   HCT  --  44.5  --   --  46.1  --   --  51.9  --   --   PLT  --  228  --   --  240  --   --  240  --   --   HEPARINUNFRC  --   --   --   --   --   --  0.11*  --  0.17* <0.10*  CREATININE  --  1.24  --   --  1.09 1.16  --  1.13  --   --   TROPONINIHS 30*  --  220* 44*  --   --   --   --   --   --      Estimated Creatinine Clearance: 112.2 mL/min (by C-G formula based on SCr of 1.13 mg/dL).   Medical History: Past Medical History:  Diagnosis Date   CAD S/P percutaneous coronary angioplasty 11/20/2013   a. 09/2009: EKG with Inf STEMI - no obstructive CAD;  b. 11/2013 Inflat STEMI/PCI: LM nl, LAD 20p, D1 sm - diff dzs, D2 large - nl, LCX 95-99, OM1 100 (3.5x38 Promus Premier DES), RCA 95-58m, 80d (3.0x20 and 3.0x16 Promus Premier DES') - normal EF;  c. NSTEMI 5/18 - patent stents, otw minimal CAD.- EF by Echo 35-40%; d. NSTEMI 10/2018 - ?  culprit - Small branch of  D2,Med Rx,.  EF by Echo ~25%   Cardiomyopathy, ischemic 07/2016   h/o Inf STEMI 11/2013 - EF was "Normal"; b) NSTEMI 07/2016 (NO Cultprit on Cath) - Echo EF 35-40% (Basal Ant-Lat HK, basal-mid Inferolateral & apical Akinesis); c) NSTEMI 10/2018 -felt to be occluded small branch of D2, Echo EF further decreased to 20-25%.    Essential hypertension    Hyperlipidemia with target LDL less than 100    Marijuana abuse    Migraine    Morbid obesity (HCC)    ST-segment elevation myocardial infarction (STEMI) of inferior wall ([place]) 11/20/2013    Medications:  Medications Prior to Admission  Medication Sig Dispense Refill Last Dose   acetaminophen (TYLENOL) 500 MG tablet Take 1,000 mg by mouth 2 (two) times daily as needed for moderate pain or headache.   Past Week   aspirin EC 81 MG tablet Take 1 tablet (81 mg total) by  mouth daily. 30 tablet 5 06/02/2022 at am   atorvastatin (LIPITOR) 80 MG tablet Take 1 tablet (80 mg total) by mouth daily at 6 PM 90 tablet 3 06/02/2022 at am   carvedilol (COREG) 6.25 MG tablet Take 1.5 tablets (9.375 mg total) by mouth 2 (two) times daily with a meal. 90 tablet 5 06/02/2022 at 1000   dapagliflozin propanediol (FARXIGA) 10 MG TABS tablet Take 1 tablet (10 mg total) by mouth daily. 30 tablet 5 06/02/2022 at am   ezetimibe (ZETIA) 10 MG tablet Take 1 tablet (10 mg total) by mouth daily. 30 tablet 5 06/02/2022   furosemide (LASIX) 20 MG tablet Take 1 tablet (20 mg total) by mouth daily. 30 tablet 5 06/03/2022 at 0800   nitroGLYCERIN (NITROSTAT) 0.4 MG SL tablet Place 1 tablet (0.4 mg total) under the tongue every 5 (five) minutes as needed for chest pain. 25 tablet 2 UNKNOWN   sacubitril-valsartan (ENTRESTO) 49-51 MG Take 1 tablet by mouth 2 (two) times daily. 180 tablet 3 06/03/2022 at 0800   spironolactone (ALDACTONE) 25 MG tablet Take 1 tablet (25 mg total) by mouth daily. 30 tablet 5 06/02/2022 at am   Scheduled:   aspirin EC  81 mg Oral Daily    atorvastatin  80 mg Oral q1800   carvedilol  9.375 mg Oral BID WC   dapagliflozin propanediol  10 mg Oral Daily   ezetimibe  10 mg Oral Daily   heparin  4,000 Units Intravenous Once   polyethylene glycol  17 g Oral Daily   sacubitril-valsartan  1 tablet Oral BID   senna-docusate  1 tablet Oral BID   spironolactone  25 mg Oral Daily   Infusions:   cefTRIAXone (ROCEPHIN)  IV 1 g (06/05/22 1414)   heparin 2,000 Units/hr (06/05/22 1438)    Assessment: 42yo male admitted 3/17 for CP and started on heparin which was d/c'd when it was thought that the troponin trend was d/t HF exacerbation; c/o acute abdominal pain 3/18 pm associated w/ straining for BM; he then coughed up a quarter-sized amount of blood >> pt sent for CTA which reveals PE and renal infarct possibly d/t paradoxical embolism >> on IV heparin -Hg= 18.1, plt= 240  Heparin still came back subtherapeutic. No issue with drip or bleeding per Rn. We will re-bolus and increase rate.  Goal of Therapy:  Heparin level 0.3-0.7 units/ml Monitor platelets by anticoagulation protocol: Yes   Plan:  -Heparin bolus 4000 units x1 then increase infusion to 2300 units/hr -Heparin level in AM and daily wth CBC daily  Onnie Boer, PharmD, [place], AAHIVP, CPP Infectious Disease Pharmacist 06/05/2022 7:46 PM

## 2022-06-06 DIAGNOSIS — I5023 Acute on chronic systolic (congestive) heart failure: Secondary | ICD-10-CM | POA: Diagnosis not present

## 2022-06-06 DIAGNOSIS — I5022 Chronic systolic (congestive) heart failure: Secondary | ICD-10-CM

## 2022-06-06 DIAGNOSIS — I2699 Other pulmonary embolism without acute cor pulmonale: Secondary | ICD-10-CM | POA: Diagnosis not present

## 2022-06-06 DIAGNOSIS — R7989 Other specified abnormal findings of blood chemistry: Secondary | ICD-10-CM | POA: Diagnosis not present

## 2022-06-06 DIAGNOSIS — I25118 Atherosclerotic heart disease of native coronary artery with other forms of angina pectoris: Secondary | ICD-10-CM | POA: Diagnosis not present

## 2022-06-06 LAB — BASIC METABOLIC PANEL
Anion gap: 9 (ref 5–15)
BUN: 16 mg/dL (ref 6–20)
CO2: 26 mmol/L (ref 22–32)
Calcium: 8.1 mg/dL — ABNORMAL LOW (ref 8.9–10.3)
Chloride: 99 mmol/L (ref 98–111)
Creatinine, Ser: 1.05 mg/dL (ref 0.61–1.24)
GFR, Estimated: >60 mL/min (ref >60)
Glucose, Bld: 121 mg/dL — ABNORMAL HIGH (ref 70–99)
Potassium: 4.4 mmol/L (ref 3.5–5.1)
Sodium: 134 mmol/L — ABNORMAL LOW (ref 135–145)

## 2022-06-06 LAB — CBC
HCT: 53.9 % — ABNORMAL HIGH (ref 39.0–52.0)
Hemoglobin: 18.5 g/dL — ABNORMAL HIGH (ref 13.0–17.0)
MCH: 31.1 pg (ref 26.0–34.0)
MCHC: 34.3 g/dL (ref 30.0–36.0)
MCV: 90.6 fL (ref 80.0–100.0)
Platelets: 284 10*3/uL (ref 150–400)
RBC: 5.95 MIL/uL — ABNORMAL HIGH (ref 4.22–5.81)
RDW: 13.1 % (ref 11.5–15.5)
WBC: 17.1 10*3/uL — ABNORMAL HIGH (ref 4.0–10.5)
nRBC: 0 % (ref 0.0–0.2)

## 2022-06-06 LAB — HEPARIN LEVEL (UNFRACTIONATED): Heparin Unfractionated: 0.77 IU/mL — ABNORMAL HIGH (ref 0.30–0.70)

## 2022-06-06 MED ORDER — APIXABAN 5 MG PO TABS
10.0000 mg | ORAL_TABLET | Freq: Two times a day (BID) | ORAL | Status: DC
  Administered 2022-06-06 – 2022-06-08 (×5): 10 mg via ORAL
  Filled 2022-06-06 (×6): qty 2

## 2022-06-06 MED ORDER — APIXABAN 5 MG PO TABS: 5.0000 mg | ORAL_TABLET | Freq: Two times a day (BID) | ORAL | Status: DC

## 2022-06-06 NOTE — Progress Notes (Addendum)
Rounding Note    Patient Name: Reginald Fernandez Date of Encounter: 06/06/2022  [place] Cardiologist: Glenetta Hew, MD   Subjective   Patient states he has improvement in pain and only noting flank pain now. Denies any chest pain, dizziness, shortness of breath, leg swelling.   Inpatient Medications    Scheduled Meds:  aspirin EC  81 mg Oral Daily   atorvastatin  80 mg Oral q1800   carvedilol  9.375 mg Oral BID WC   dapagliflozin propanediol  10 mg Oral Daily   ezetimibe  10 mg Oral Daily   polyethylene glycol  17 g Oral Daily   sacubitril-valsartan  1 tablet Oral BID   senna-docusate  1 tablet Oral BID   spironolactone  25 mg Oral Daily   Continuous Infusions:  cefTRIAXone (ROCEPHIN)  IV 1 g (06/05/22 1414)   heparin 2,300 Units/hr (06/06/22 0559)   PRN Meds: acetaminophen **OR** acetaminophen, dicyclomine, levalbuterol, magnesium hydroxide, morphine injection, nitroGLYCERIN, nitroGLYCERIN, ondansetron **OR** ondansetron (ZOFRAN) IV, ondansetron (ZOFRAN) IV, oxyCODONE-acetaminophen   Vital Signs    Vitals:   06/05/22 1605 06/05/22 2133 06/06/22 0533 06/06/22 0803  BP: (!) 120/92  108/84 97/78  Pulse: 89 80 92 (!) 30  Resp: 16 16 14 16   Temp: 97.6 F (36.4 C) 97.6 F (36.4 C) 97.6 F (36.4 C) 98 F (36.7 C)  TempSrc: Oral Oral Oral Oral  SpO2: 97% 99% 100% (!) 87%  Weight:   108.8 kg   Height:        Intake/Output Summary (Last 24 hours) at 06/06/2022 0916 Last data filed at 06/06/2022 0559 Gross per 24 hour  Intake 630.68 ml  Output --  Net 630.68 ml      06/06/2022    5:33 AM 06/05/2022    7:02 AM 06/03/2022    8:36 PM  Last 3 Weights  Weight (lbs) 239 lb 14.4 oz 236 lb 6.4 oz 240 lb  Weight (kg) 108.818 kg 107.23 kg 108.863 kg      Telemetry    NSR - Personally Reviewed  ECG    No new - Personally Reviewed  Physical Exam  GEN: No acute distress.   Neck: No JVD Cardiac: RRR, no murmurs, rubs, or gallops.  Respiratory: Clear  to auscultation bilaterally. GI: Soft, nontender, non-distended  MS: No edema; No deformity. Neuro:  Nonfocal  Psych: Normal affect   Labs    High Sensitivity Troponin:   Recent Labs  Lab 06/02/22 2142 06/02/22 2348 06/03/22 2210 06/03/22 2332  TROPONINIHS 27* 30* 220* 44*     Chemistry Recent Labs  Lab 06/02/22 2047 06/03/22 2020 06/04/22 0149 06/04/22 0420 06/05/22 0151 06/06/22 0543  NA 136   < > 136 138 131* 134*  K 4.1   < > 4.2 4.0 3.7 4.4  CL 104   < > 101 101 95* 99  CO2 24   < > 24 26 23 26   GLUCOSE 139*   < > 134* 107* 129* 121*  BUN 17   < > 11 10 13 16   CREATININE 1.03   < > 1.09 1.16 1.13 1.05  CALCIUM 8.3*   < > 8.6* 8.4* 8.2* 8.1*  MG  --    < > 2.0 1.9 2.2  --   PROT 6.9  --  7.0  --   --   --   ALBUMIN 3.5  --  3.7  --   --   --   AST 25  --  29  --   --   --   ALT 27  --  31  --   --   --   ALKPHOS 59  --  59  --   --   --   BILITOT 2.1*  --  3.5*  --   --   --   GFRNONAA >60   < > >60 >60 >60 >60  ANIONGAP 8   < > 11 11 13 9    < > = values in this interval not displayed.    Lipids  Recent Labs  Lab 06/04/22 0149  CHOL 176  TRIG 72  HDL 27*  LDLCALC 135*  CHOLHDL 6.5    Hematology Recent Labs  Lab 06/04/22 0149 06/05/22 0151 06/06/22 0543  WBC 12.1* 12.9* 17.1*  RBC 4.98 5.81 5.95*  HGB 15.3 18.1* 18.5*  HCT 46.1 51.9 53.9*  MCV 92.6 89.3 90.6  MCH 30.7 31.2 31.1  MCHC 33.2 34.9 34.3  RDW 13.0 13.0 13.1  PLT 240 240 284   Thyroid No results for input(s): "TSH", "FREET4" in the last 168 hours.  BNP Recent Labs  Lab 06/02/22 2047 06/03/22 2332  BNP 1,359.4* 745.9*    DDimer No results for input(s): "DDIMER" in the last 168 hours.   Radiology    VAS [place] LOWER EXTREMITY VENOUS (DVT)  Result Date: 06/05/2022  Lower Venous DVT Study Patient Name:  Reginald Fernandez  Date of Exam:   06/05/2022 Medical Rec #: [id number]       Accession #:    [id number] Date of Birth: 1980/11/10       Patient Gender: M Patient Age:   42 years Exam  Location:  [facility] Procedure:      VAS [place] LOWER EXTREMITY VENOUS (DVT) Referring Phys: Eugenie Norrie --------------------------------------------------------------------------------  Indications: Swelling, Pain, and pulmonary embolism.  Anticoagulation: Heparin. Comparison Study: No prior study. Performing Technologist: McKayla Maag RVT, [place]  Examination Guidelines: A complete evaluation includes B-mode imaging, spectral Doppler, color Doppler, and power Doppler as needed of all accessible portions of each vessel. Bilateral testing is considered an integral part of a complete examination. Limited examinations for reoccurring indications may be performed as noted. The reflux portion of the exam is performed with the patient in reverse Trendelenburg.  +---------+---------------+---------+-----------+----------+--------------+ RIGHT    CompressibilityPhasicitySpontaneityPropertiesThrombus Aging +---------+---------------+---------+-----------+----------+--------------+ CFV      Full           Yes      Yes                                 +---------+---------------+---------+-----------+----------+--------------+ SFJ      Full                                                        +---------+---------------+---------+-----------+----------+--------------+ FV Prox  Full                                                        +---------+---------------+---------+-----------+----------+--------------+ FV Mid   Full                                                        +---------+---------------+---------+-----------+----------+--------------+  FV DistalFull                                                        +---------+---------------+---------+-----------+----------+--------------+ PFV      Full                                                        +---------+---------------+---------+-----------+----------+--------------+ POP      None           No       No                    Acute          +---------+---------------+---------+-----------+----------+--------------+ PTV      Full                                                        +---------+---------------+---------+-----------+----------+--------------+ PERO     Full                                                        +---------+---------------+---------+-----------+----------+--------------+   +---------+---------------+---------+-----------+----------+--------------+ LEFT     CompressibilityPhasicitySpontaneityPropertiesThrombus Aging +---------+---------------+---------+-----------+----------+--------------+ CFV      Full           Yes      Yes                                 +---------+---------------+---------+-----------+----------+--------------+ SFJ      Full                                                        +---------+---------------+---------+-----------+----------+--------------+ FV Prox  Full                                                        +---------+---------------+---------+-----------+----------+--------------+ FV Mid   Full                                                        +---------+---------------+---------+-----------+----------+--------------+ FV DistalNone           No       No                   Acute          +---------+---------------+---------+-----------+----------+--------------+  PFV      Full                                                        +---------+---------------+---------+-----------+----------+--------------+ POP      Full           Yes      Yes                                 +---------+---------------+---------+-----------+----------+--------------+ PTV      None           No       No                   Acute          +---------+---------------+---------+-----------+----------+--------------+ PERO     Full                                                         +---------+---------------+---------+-----------+----------+--------------+     Summary: RIGHT: - Findings consistent with acute deep vein thrombosis involving the right popliteal vein. - A cystic structure is found in the popliteal fossa.  LEFT: - Findings consistent with acute deep vein thrombosis involving the left femoral vein, and left posterior tibial veins. - No cystic structure found in the popliteal fossa.  *See table(s) above for measurements and observations. Electronically signed by Deitra Mayo MD on 06/05/2022 at 4:44:24 PM.    Final    CT ABDOMEN PELVIS W CONTRAST  Result Date: 06/05/2022 CLINICAL DATA:  Hemoptysis; Abdominal pain, acute, nonlocalized EXAM: CT ANGIOGRAPHY CHEST CT ABDOMEN AND PELVIS WITH CONTRAST TECHNIQUE: Multidetector CT imaging of the chest was performed using the standard protocol during bolus administration of intravenous contrast. Multiplanar CT image reconstructions and MIPs were obtained to evaluate the vascular anatomy. Multidetector CT imaging of the abdomen and pelvis was performed using the standard protocol during bolus administration of intravenous contrast. RADIATION DOSE REDUCTION: This exam was performed according to the departmental dose-optimization program which includes automated exposure control, adjustment of the mA and/or kV according to patient size and/or use of iterative reconstruction technique. CONTRAST:  143mL OMNIPAQUE IOHEXOL 350 MG/ML SOLN COMPARISON:  CT abdomen pelvis 06/03/2022 FINDINGS: CTA CHEST FINDINGS Cardiovascular: There is adequate opacification of the pulmonary arterial tree. There are multiple branching intraluminal filling defects identified within the segmental pulmonary arteries of the a right upper lobe and posterior basal lower lobes bilaterally in keeping with acute pulmonary embolism. The embolic burden is moderate. The central pulmonary arteries are enlarged in keeping with changes of pulmonary arterial hypertension.  There is no CT evidence of right heart strain with the RV/LV ratio measuring 0.9. Mild cardiomegaly is present with biventricular enlargement. Stenting of the left circumflex and right coronary arteries has been performed. Moderate multi-vessel coronary artery calcification. No pericardial effusion. No significant aortic atherosclerotic calcification or aneurysmal dilation. Aberrant origin of the right subclavian artery noted. Mediastinum/Nodes: No enlarged mediastinal, hilar, or axillary lymph nodes. Thyroid gland, trachea, and esophagus demonstrate no significant findings. Lungs/Pleura: Patchy infiltrate within the posterior basal right lower lobe is  nonspecific, possibly inflammatory or hemorrhagic in the setting of a small pulmonary infarct. The lungs are otherwise clear. No pneumothorax or pleural effusion. No central obstructing lesion. Musculoskeletal: No acute bone abnormality. No lytic or blastic bone lesion. Review of the MIP images confirms the above findings. CT ABDOMEN and PELVIS FINDINGS Hepatobiliary: No focal liver abnormality is seen. No gallstones, gallbladder wall thickening, or biliary dilatation. Pancreas: Unremarkable Spleen: Unremarkable Adrenals/Urinary Tract: The adrenal glands are unremarkable. The kidneys are normal in size and position. Mild cortical scarring noted bilaterally. There is heterogeneous cortical enhancement again identified within the kidneys bilaterally which may reflect a superimposed inflammatory process such as pyelonephritis. However, the area of more focal hypoenhancement within the lower pole of the left kidney now demonstrates frank non enhancement and is more compatible with an acute renal infarct. This may represent a infarct related to a paradoxical embolus given the presence of acute pulmonary embolism described above. Contrast staining noted within the infarcted region. No hydronephrosis. No intrarenal or ureteral calculi. The bladder is unremarkable.  Stomach/Bowel: Stomach is within normal limits. Appendix appears normal. No evidence of bowel wall thickening, distention, or inflammatory changes. Vascular/Lymphatic: Aortic atherosclerosis. No enlarged abdominal or pelvic lymph nodes. Reproductive: Mild prostatic hypertrophy. Other: No abdominal wall hernia or abnormality. No abdominopelvic ascites. Musculoskeletal: No acute bone abnormality. No lytic or blastic bone lesion. Review of the MIP images confirms the above findings. IMPRESSION: 1. Acute pulmonary embolism with moderate embolic burden. No CT evidence of right heart strain. 2. Morphologic changes in keeping with pulmonary arterial hypertension. 3. Mild cardiomegaly with biventricular enlargement. Moderate multi-vessel coronary artery calcification. 4. Patchy infiltrate within the posterior basal right lower lobe, nonspecific, possibly inflammatory or hemorrhagic in the setting of a small pulmonary infarct. 5. Heterogeneous cortical enhancement within the kidneys bilaterally which may reflect a superimposed inflammatory process such as pyelonephritis. However, the area of more focal hypoenhancement within the lower pole of the left kidney now demonstrates frank non enhancement and is more compatible with an evolving acute renal infarct. This may represent an infarct related to a paradoxical embolus given the presence of acute pulmonary embolism described above and echocardiography may be helpful to assess for the presence of an intracardiac shunt Aortic Atherosclerosis (ICD10-I70.0). Electronically Signed   By: Fidela Salisbury M.D.   On: 06/05/2022 01:23   CT Angio Chest Pulmonary Embolism (PE) W or WO Contrast  Result Date: 06/05/2022 CLINICAL DATA:  Hemoptysis; Abdominal pain, acute, nonlocalized EXAM: CT ANGIOGRAPHY CHEST CT ABDOMEN AND PELVIS WITH CONTRAST TECHNIQUE: Multidetector CT imaging of the chest was performed using the standard protocol during bolus administration of intravenous contrast.  Multiplanar CT image reconstructions and MIPs were obtained to evaluate the vascular anatomy. Multidetector CT imaging of the abdomen and pelvis was performed using the standard protocol during bolus administration of intravenous contrast. RADIATION DOSE REDUCTION: This exam was performed according to the departmental dose-optimization program which includes automated exposure control, adjustment of the mA and/or kV according to patient size and/or use of iterative reconstruction technique. CONTRAST:  115mL OMNIPAQUE IOHEXOL 350 MG/ML SOLN COMPARISON:  CT abdomen pelvis 06/03/2022 FINDINGS: CTA CHEST FINDINGS Cardiovascular: There is adequate opacification of the pulmonary arterial tree. There are multiple branching intraluminal filling defects identified within the segmental pulmonary arteries of the a right upper lobe and posterior basal lower lobes bilaterally in keeping with acute pulmonary embolism. The embolic burden is moderate. The central pulmonary arteries are enlarged in keeping with changes of pulmonary arterial hypertension. There is  no CT evidence of right heart strain with the RV/LV ratio measuring 0.9. Mild cardiomegaly is present with biventricular enlargement. Stenting of the left circumflex and right coronary arteries has been performed. Moderate multi-vessel coronary artery calcification. No pericardial effusion. No significant aortic atherosclerotic calcification or aneurysmal dilation. Aberrant origin of the right subclavian artery noted. Mediastinum/Nodes: No enlarged mediastinal, hilar, or axillary lymph nodes. Thyroid gland, trachea, and esophagus demonstrate no significant findings. Lungs/Pleura: Patchy infiltrate within the posterior basal right lower lobe is nonspecific, possibly inflammatory or hemorrhagic in the setting of a small pulmonary infarct. The lungs are otherwise clear. No pneumothorax or pleural effusion. No central obstructing lesion. Musculoskeletal: No acute bone  abnormality. No lytic or blastic bone lesion. Review of the MIP images confirms the above findings. CT ABDOMEN and PELVIS FINDINGS Hepatobiliary: No focal liver abnormality is seen. No gallstones, gallbladder wall thickening, or biliary dilatation. Pancreas: Unremarkable Spleen: Unremarkable Adrenals/Urinary Tract: The adrenal glands are unremarkable. The kidneys are normal in size and position. Mild cortical scarring noted bilaterally. There is heterogeneous cortical enhancement again identified within the kidneys bilaterally which may reflect a superimposed inflammatory process such as pyelonephritis. However, the area of more focal hypoenhancement within the lower pole of the left kidney now demonstrates frank non enhancement and is more compatible with an acute renal infarct. This may represent a infarct related to a paradoxical embolus given the presence of acute pulmonary embolism described above. Contrast staining noted within the infarcted region. No hydronephrosis. No intrarenal or ureteral calculi. The bladder is unremarkable. Stomach/Bowel: Stomach is within normal limits. Appendix appears normal. No evidence of bowel wall thickening, distention, or inflammatory changes. Vascular/Lymphatic: Aortic atherosclerosis. No enlarged abdominal or pelvic lymph nodes. Reproductive: Mild prostatic hypertrophy. Other: No abdominal wall hernia or abnormality. No abdominopelvic ascites. Musculoskeletal: No acute bone abnormality. No lytic or blastic bone lesion. Review of the MIP images confirms the above findings. IMPRESSION: 1. Acute pulmonary embolism with moderate embolic burden. No CT evidence of right heart strain. 2. Morphologic changes in keeping with pulmonary arterial hypertension. 3. Mild cardiomegaly with biventricular enlargement. Moderate multi-vessel coronary artery calcification. 4. Patchy infiltrate within the posterior basal right lower lobe, nonspecific, possibly inflammatory or hemorrhagic in the  setting of a small pulmonary infarct. 5. Heterogeneous cortical enhancement within the kidneys bilaterally which may reflect a superimposed inflammatory process such as pyelonephritis. However, the area of more focal hypoenhancement within the lower pole of the left kidney now demonstrates frank non enhancement and is more compatible with an evolving acute renal infarct. This may represent an infarct related to a paradoxical embolus given the presence of acute pulmonary embolism described above and echocardiography may be helpful to assess for the presence of an intracardiac shunt Aortic Atherosclerosis (ICD10-I70.0). Electronically Signed   By: Fidela Salisbury M.D.   On: 06/05/2022 01:23   ECHOCARDIOGRAM COMPLETE  Result Date: 06/04/2022    ECHOCARDIOGRAM REPORT   Patient Name:   Reginald Fernandez Date of Exam: 06/04/2022 Medical Rec #:  [id number]      Height:       74.0 in Accession #:    [id number]     Weight:       240.0 lb Date of Birth:  02/25/1981      BSA:          2.349 m Patient Age:    41 years       BP:           111/81 mmHg Patient Gender: M  HR:           100 bpm. Exam Location:  Inpatient Procedure: 2D Echo, Cardiac Doppler, Color Doppler and Intracardiac            Opacification Agent Indications:    NSTEMI I21.4  History:        Patient has prior history of Echocardiogram examinations, most                 recent 10/10/2021. Cardiomyopathy, CAD and Previous Myocardial                 Infarction; Risk Factors:Hypertension and Dyslipidemia. S/P                 percutaneous coronary angioplasty 11/20/2013.  Sonographer:    Wilkie Aye RVT RCS Referring Phys: [id number] [place]  1. Left ventricular ejection fraction, by estimation, is <20%. The left ventricle has severely decreased function. The left ventricle demonstrates global hypokinesis. The left ventricular internal cavity size was severely dilated. Left ventricular diastolic parameters are consistent with Grade II  diastolic dysfunction (pseudonormalization).  2. Right ventricular systolic function is severely reduced. The right ventricular size is moderately enlarged. Tricuspid regurgitation signal is inadequate for assessing PA pressure.  3. Left atrial size was mildly dilated.  4. Right atrial size was mildly dilated.  5. The mitral valve is grossly normal. Trivial mitral valve regurgitation. No evidence of mitral stenosis.  6. The aortic valve is tricuspid. Aortic valve regurgitation is not visualized. No aortic stenosis is present.  7. The inferior vena cava is normal in size with greater than 50% respiratory variability, suggesting right atrial pressure of 3 mmHg. Comparison(s): No significant change from prior study. Conclusion(s)/Recommendation(s): No left ventricular mural or apical thrombus/thrombi. FINDINGS  Left Ventricle: Left ventricular ejection fraction, by estimation, is <20%. The left ventricle has severely decreased function. The left ventricle demonstrates global hypokinesis. Definity contrast agent was given IV to delineate the left ventricular endocardial borders. The left ventricular internal cavity size was severely dilated. There is no left ventricular hypertrophy. Left ventricular diastolic parameters are consistent with Grade II diastolic dysfunction (pseudonormalization). Right Ventricle: The right ventricular size is moderately enlarged. No increase in right ventricular wall thickness. Right ventricular systolic function is severely reduced. Tricuspid regurgitation signal is inadequate for assessing PA pressure. Left Atrium: Left atrial size was mildly dilated. Right Atrium: Right atrial size was mildly dilated. Pericardium: There is no evidence of pericardial effusion. Mitral Valve: The mitral valve is grossly normal. Trivial mitral valve regurgitation. No evidence of mitral valve stenosis. Tricuspid Valve: The tricuspid valve is grossly normal. Tricuspid valve regurgitation is trivial. No  evidence of tricuspid stenosis. Aortic Valve: The aortic valve is tricuspid. Aortic valve regurgitation is not visualized. No aortic stenosis is present. Aortic valve mean gradient measures 2.0 mmHg. Aortic valve peak gradient measures 3.0 mmHg. Aortic valve area, by VTI measures 3.42 cm. Pulmonic Valve: The pulmonic valve was grossly normal. Pulmonic valve regurgitation is trivial. No evidence of pulmonic stenosis. Aorta: The aortic root and ascending aorta are structurally normal, with no evidence of dilitation. Venous: The inferior vena cava is normal in size with greater than 50% respiratory variability, suggesting right atrial pressure of 3 mmHg. IAS/Shunts: The atrial septum is grossly normal.  LEFT VENTRICLE PLAX 2D LVIDd:         6.90 cm   Diastology LVIDs:         5.80 cm   LV e' medial:  4.60 cm/s LV PW:         1.30 cm   LV E/e' medial:  13.2 LV IVS:        1.10 cm   LV e' lateral:   6.46 cm/s LVOT diam:     2.20 cm   LV E/e' lateral: 9.4 LV SV:         41 LV SV Index:   18 LVOT Area:     3.80 cm  RIGHT VENTRICLE             IVC RV Basal diam:  4.30 cm     IVC diam: 1.20 cm RV Mid diam:    3.40 cm RV S prime:     10.70 cm/s TAPSE (M-mode): 2.1 cm LEFT ATRIUM             Index        RIGHT ATRIUM           Index LA diam:        5.10 cm 2.17 cm/m   RA Area:     24.00 cm LA Vol (A2C):   94.5 ml 40.24 ml/m  RA Volume:   81.70 ml  34.79 ml/m LA Vol (A4C):   86.4 ml 36.79 ml/m LA Biplane Vol: 91.9 ml 39.13 ml/m  AORTIC VALVE                    PULMONIC VALVE AV Area (Vmax):    3.37 cm     PV Vmax:       0.68 m/s AV Area (Vmean):   3.37 cm     PV Peak grad:  1.8 mmHg AV Area (VTI):     3.42 cm AV Vmax:           87.20 cm/s AV Vmean:          67.800 cm/s AV VTI:            0.121 m AV Peak Grad:      3.0 mmHg AV Mean Grad:      2.0 mmHg LVOT Vmax:         77.40 cm/s LVOT Vmean:        60.100 cm/s LVOT VTI:          0.109 m LVOT/AV VTI ratio: 0.90  AORTA Ao Root diam: 3.50 cm Ao Asc diam:  3.50 cm Ao  Arch diam: 3.4 cm MITRAL VALVE MV Area (PHT): 6.60 cm    SHUNTS MV Decel Time: 115 msec    Systemic VTI:  0.11 m MV E velocity: 60.80 cm/s  Systemic Diam: 2.20 cm MV A velocity: 43.00 cm/s MV E/A ratio:  1.41 Eleonore Chiquito MD Electronically signed by Eleonore Chiquito MD Signature Date/Time: 06/04/2022/5:22:12 PM    Final     Cardiac Studies   Echocardiogram 06/04/2022 Left Ventricle: Left ventricular ejection fraction, by estimation, is  <20%. The left ventricle has severely decreased function. The left  ventricle demonstrates global hypokinesis. Definity contrast agent was  given IV to delineate the left ventricular  endocardial borders. The left ventricular internal cavity size was  severely dilated. There is no left ventricular hypertrophy. Left  ventricular diastolic parameters are consistent with Grade II diastolic  dysfunction (pseudonormalization).   Right Ventricle: The right ventricular size is moderately enlarged. No  increase in right ventricular wall thickness. Right ventricular systolic  function is severely reduced. Tricuspid regurgitation signal is inadequate  for assessing PA pressure.   Left Atrium: Left  atrial size was mildly dilated.   Right Atrium: Right atrial size was mildly dilated.   Pericardium: There is no evidence of pericardial effusion.   Mitral Valve: The mitral valve is grossly normal. Trivial mitral valve  regurgitation. No evidence of mitral valve stenosis.   Tricuspid Valve: The tricuspid valve is grossly normal. Tricuspid valve  regurgitation is trivial. No evidence of tricuspid stenosis.   Aortic Valve: The aortic valve is tricuspid. Aortic valve regurgitation is  not visualized. No aortic stenosis is present. Aortic valve mean gradient  measures 2.0 mmHg. Aortic valve peak gradient measures 3.0 mmHg. Aortic  valve area, by VTI measures 3.42  cm.   Pulmonic Valve: The pulmonic valve was grossly normal. Pulmonic valve  regurgitation is trivial. No  evidence of pulmonic stenosis.   Aorta: The aortic root and ascending aorta are structurally normal, with  no evidence of dilitation.   Venous: The inferior vena cava is normal in size with greater than 50%  respiratory variability, suggesting right atrial pressure of 3 mmHg.   IAS/Shunts: The atrial septum is grossly normal.    Right and left heart catheterization 10/11/2021   1st Diag lesion is 60% stenosed.   Prox RCA-1 lesion is 55% stenosed.   Mid RCA lesion is 40% stenosed.   Prox RCA-2 lesion is 30% stenosed.   Mid LAD lesion is 40% stenosed.   Prox Cx to Dist Cx lesion is 50% stenosed.   Non-stenotic Prox Cx lesion was previously treated.   Non-stenotic Prox RCA to Mid RCA lesion was previously treated.   Non-stenotic Dist RCA lesion was previously treated.   Non-stenotic 1st Mrg lesion was previously treated.   1. Mildly elevated PCWP with pulmonary venous hypertension.  2. Normal RA pressure.  3. Preserved cardiac output.  4. Diffuse CAD but no severe stenoses that would explain worsening of EF.   Patient Profile     42 y.o. male with a hx of HTN, combined systolic and diastolic HF, EF [id number] with global hypokinesis, CAD s/p multiple MI with several DES, obesity, fatty liver, former smoker who presented to the ED 3/16 initially for SOB and abdominal pain then discharged for acute on chronic CHF exacerbation with plan to diurese. He returned to ED on 3/17 for dehydration concerns. Now with new diagnosis of pulmonary embolism/DVT found on 06/04/2022 with suspected pulmonary infarct in the right lower lobe with no RV strain and acute pyelonephritis.  Assessment & Plan     Expand All Collapse All      Rounding Note      Patient Name: Reginald Fernandez Date of Encounter: 06/05/2022   [place] Cardiologist: Glenetta Hew, MD    Subjective    Patient lying in bed complaining of abdominal and flank pain.  Denies any chest pain, dizziness, shortness of breath,  leg swelling.   Inpatient Medications    Scheduled Meds:  aspirin EC  81 mg Oral Daily   atorvastatin  80 mg Oral q1800   carvedilol  9.375 mg Oral BID WC   dapagliflozin propanediol  10 mg Oral Daily   ezetimibe  10 mg Oral Daily   furosemide  20 mg Intravenous BID   sacubitril-valsartan  1 tablet Oral BID   spironolactone  25 mg Oral Daily    Continuous Infusions:  cefTRIAXone (ROCEPHIN)  IV 1 g (06/04/22 1448)   heparin 1,700 Units/hr (06/05/22 0234)    PRN Meds: acetaminophen **OR** acetaminophen, dicyclomine, docusate sodium, levalbuterol, magnesium hydroxide, morphine injection,  nitroGLYCERIN, nitroGLYCERIN, ondansetron **OR** ondansetron (ZOFRAN) IV, ondansetron (ZOFRAN) IV    Vital Signs          Vitals:    06/04/22 1805 06/04/22 2100 06/04/22 2154 06/05/22 0242  BP: 116/83 105/79 106/79 111/81  Pulse: 96 84   84  Resp:   18   16  Temp: 97.7 F (36.5 C) 97.9 F (36.6 C)      TempSrc: Oral Oral      SpO2:   99%   93%  Weight:          Height:              Intake/Output Summary (Last 24 hours) at 06/05/2022 0915 Last data filed at 06/05/2022 0700    Gross per 24 hour  Intake 957.38 ml  Output 750 ml  Net 207.38 ml        06/03/2022    8:36 PM 06/02/2022    8:40 PM 11/14/2021   11:09 AM  Last 3 Weights  Weight (lbs) 240 lb 243 lb 262 lb 12.8 oz  Weight (kg) 108.863 kg 110.224 kg 119.205 kg       Telemetry    NSR - Personally Reviewed   ECG    NSR with biatrial enlargment, R axis deviation, Q waves laterally and inferiorly with some PVCs - Personally Reviewed   Physical Exam  GEN: In apparent pain Neck: No JVD Cardiac: RRR, no murmurs, rubs, or gallops.  Respiratory: Clear to auscultation bilaterally. GI: + TTP, slight guarding MS: No edema; No deformity. Neuro:  Nonfocal  Psych: Normal affect    Labs    High Sensitivity Troponin:   Last Labs        Recent Labs  Lab 06/02/22 2142 06/02/22 2348 06/03/22 2210 06/03/22 2332  TROPONINIHS  27* 30* 220* 44*       Chemistry Last Labs         Recent Labs  Lab 06/02/22 2047 06/03/22 2020 06/04/22 0149 06/04/22 0420 06/05/22 0151  NA 136   < > 136 138 131*  K 4.1   < > 4.2 4.0 3.7  CL 104   < > 101 101 95*  CO2 24   < > 24 26 23   GLUCOSE 139*   < > 134* 107* 129*  BUN 17   < > 11 10 13   CREATININE 1.03   < > 1.09 1.16 1.13  CALCIUM 8.3*   < > 8.6* 8.4* 8.2*  MG  --    < > 2.0 1.9 2.2  PROT 6.9  --  7.0  --   --   ALBUMIN 3.5  --  3.7  --   --   AST 25  --  29  --   --   ALT 27  --  31  --   --   ALKPHOS 59  --  59  --   --   BILITOT 2.1*  --  3.5*  --   --   GFRNONAA >60   < > >60 >60 >60  ANIONGAP 8   < > 11 11 13    < > = values in this interval not displayed.      Lipids  Last Labs     Recent Labs  Lab 06/04/22 0149  CHOL 176  TRIG 72  HDL 27*  LDLCALC 135*  CHOLHDL 6.5      Hematology Last Labs       Recent Labs  Lab 06/03/22 2020 06/04/22  0149 06/05/22 0151  WBC 11.1* 12.1* 12.9*  RBC 4.88 4.98 5.81  HGB 15.1 15.3 18.1*  HCT 44.5 46.1 51.9  MCV 91.2 92.6 89.3  MCH 30.9 30.7 31.2  MCHC 33.9 33.2 34.9  RDW 12.9 13.0 13.0  PLT 228 240 240      Thyroid  Last Labs  No results for input(s): "TSH", "FREET4" in the last 168 hours.    BNP Last Labs      Recent Labs  Lab 06/02/22 2047 06/03/22 2332  BNP 1,359.4* 745.9*      DDimer  Last Labs  No results for input(s): "DDIMER" in the last 168 hours.      Radiology     Imaging Results (Last 48 hours)  CT ABDOMEN PELVIS W CONTRAST   Result Date: 06/05/2022 CLINICAL DATA:  Hemoptysis; Abdominal pain, acute, nonlocalized EXAM: CT ANGIOGRAPHY CHEST CT ABDOMEN AND PELVIS WITH CONTRAST TECHNIQUE: Multidetector CT imaging of the chest was performed using the standard protocol during bolus administration of intravenous contrast. Multiplanar CT image reconstructions and MIPs were obtained to evaluate the vascular anatomy. Multidetector CT imaging of the abdomen and pelvis was  performed using the standard protocol during bolus administration of intravenous contrast. RADIATION DOSE REDUCTION: This exam was performed according to the departmental dose-optimization program which includes automated exposure control, adjustment of the mA and/or kV according to patient size and/or use of iterative reconstruction technique. CONTRAST:  123mL OMNIPAQUE IOHEXOL 350 MG/ML SOLN COMPARISON:  CT abdomen pelvis 06/03/2022 FINDINGS: CTA CHEST FINDINGS Cardiovascular: There is adequate opacification of the pulmonary arterial tree. There are multiple branching intraluminal filling defects identified within the segmental pulmonary arteries of the a right upper lobe and posterior basal lower lobes bilaterally in keeping with acute pulmonary embolism. The embolic burden is moderate. The central pulmonary arteries are enlarged in keeping with changes of pulmonary arterial hypertension. There is no CT evidence of right heart strain with the RV/LV ratio measuring 0.9. Mild cardiomegaly is present with biventricular enlargement. Stenting of the left circumflex and right coronary arteries has been performed. Moderate multi-vessel coronary artery calcification. No pericardial effusion. No significant aortic atherosclerotic calcification or aneurysmal dilation. Aberrant origin of the right subclavian artery noted. Mediastinum/Nodes: No enlarged mediastinal, hilar, or axillary lymph nodes. Thyroid gland, trachea, and esophagus demonstrate no significant findings. Lungs/Pleura: Patchy infiltrate within the posterior basal right lower lobe is nonspecific, possibly inflammatory or hemorrhagic in the setting of a small pulmonary infarct. The lungs are otherwise clear. No pneumothorax or pleural effusion. No central obstructing lesion. Musculoskeletal: No acute bone abnormality. No lytic or blastic bone lesion. Review of the MIP images confirms the above findings. CT ABDOMEN and PELVIS FINDINGS Hepatobiliary: No focal  liver abnormality is seen. No gallstones, gallbladder wall thickening, or biliary dilatation. Pancreas: Unremarkable Spleen: Unremarkable Adrenals/Urinary Tract: The adrenal glands are unremarkable. The kidneys are normal in size and position. Mild cortical scarring noted bilaterally. There is heterogeneous cortical enhancement again identified within the kidneys bilaterally which may reflect a superimposed inflammatory process such as pyelonephritis. However, the area of more focal hypoenhancement within the lower pole of the left kidney now demonstrates frank non enhancement and is more compatible with an acute renal infarct. This may represent a infarct related to a paradoxical embolus given the presence of acute pulmonary embolism described above. Contrast staining noted within the infarcted region. No hydronephrosis. No intrarenal or ureteral calculi. The bladder is unremarkable. Stomach/Bowel: Stomach is within normal limits. Appendix appears normal. No evidence of bowel  wall thickening, distention, or inflammatory changes. Vascular/Lymphatic: Aortic atherosclerosis. No enlarged abdominal or pelvic lymph nodes. Reproductive: Mild prostatic hypertrophy. Other: No abdominal wall hernia or abnormality. No abdominopelvic ascites. Musculoskeletal: No acute bone abnormality. No lytic or blastic bone lesion. Review of the MIP images confirms the above findings. IMPRESSION: 1. Acute pulmonary embolism with moderate embolic burden. No CT evidence of right heart strain. 2. Morphologic changes in keeping with pulmonary arterial hypertension. 3. Mild cardiomegaly with biventricular enlargement. Moderate multi-vessel coronary artery calcification. 4. Patchy infiltrate within the posterior basal right lower lobe, nonspecific, possibly inflammatory or hemorrhagic in the setting of a small pulmonary infarct. 5. Heterogeneous cortical enhancement within the kidneys bilaterally which may reflect a superimposed inflammatory  process such as pyelonephritis. However, the area of more focal hypoenhancement within the lower pole of the left kidney now demonstrates frank non enhancement and is more compatible with an evolving acute renal infarct. This may represent an infarct related to a paradoxical embolus given the presence of acute pulmonary embolism described above and echocardiography may be helpful to assess for the presence of an intracardiac shunt Aortic Atherosclerosis (ICD10-I70.0). Electronically Signed   By: Fidela Salisbury M.D.   On: 06/05/2022 01:23    CT Angio Chest Pulmonary Embolism (PE) W or WO Contrast   Result Date: 06/05/2022 CLINICAL DATA:  Hemoptysis; Abdominal pain, acute, nonlocalized EXAM: CT ANGIOGRAPHY CHEST CT ABDOMEN AND PELVIS WITH CONTRAST TECHNIQUE: Multidetector CT imaging of the chest was performed using the standard protocol during bolus administration of intravenous contrast. Multiplanar CT image reconstructions and MIPs were obtained to evaluate the vascular anatomy. Multidetector CT imaging of the abdomen and pelvis was performed using the standard protocol during bolus administration of intravenous contrast. RADIATION DOSE REDUCTION: This exam was performed according to the departmental dose-optimization program which includes automated exposure control, adjustment of the mA and/or kV according to patient size and/or use of iterative reconstruction technique. CONTRAST:  168mL OMNIPAQUE IOHEXOL 350 MG/ML SOLN COMPARISON:  CT abdomen pelvis 06/03/2022 FINDINGS: CTA CHEST FINDINGS Cardiovascular: There is adequate opacification of the pulmonary arterial tree. There are multiple branching intraluminal filling defects identified within the segmental pulmonary arteries of the a right upper lobe and posterior basal lower lobes bilaterally in keeping with acute pulmonary embolism. The embolic burden is moderate. The central pulmonary arteries are enlarged in keeping with changes of pulmonary arterial  hypertension. There is no CT evidence of right heart strain with the RV/LV ratio measuring 0.9. Mild cardiomegaly is present with biventricular enlargement. Stenting of the left circumflex and right coronary arteries has been performed. Moderate multi-vessel coronary artery calcification. No pericardial effusion. No significant aortic atherosclerotic calcification or aneurysmal dilation. Aberrant origin of the right subclavian artery noted. Mediastinum/Nodes: No enlarged mediastinal, hilar, or axillary lymph nodes. Thyroid gland, trachea, and esophagus demonstrate no significant findings. Lungs/Pleura: Patchy infiltrate within the posterior basal right lower lobe is nonspecific, possibly inflammatory or hemorrhagic in the setting of a small pulmonary infarct. The lungs are otherwise clear. No pneumothorax or pleural effusion. No central obstructing lesion. Musculoskeletal: No acute bone abnormality. No lytic or blastic bone lesion. Review of the MIP images confirms the above findings. CT ABDOMEN and PELVIS FINDINGS Hepatobiliary: No focal liver abnormality is seen. No gallstones, gallbladder wall thickening, or biliary dilatation. Pancreas: Unremarkable Spleen: Unremarkable Adrenals/Urinary Tract: The adrenal glands are unremarkable. The kidneys are normal in size and position. Mild cortical scarring noted bilaterally. There is heterogeneous cortical enhancement again identified within the kidneys bilaterally which may  reflect a superimposed inflammatory process such as pyelonephritis. However, the area of more focal hypoenhancement within the lower pole of the left kidney now demonstrates frank non enhancement and is more compatible with an acute renal infarct. This may represent a infarct related to a paradoxical embolus given the presence of acute pulmonary embolism described above. Contrast staining noted within the infarcted region. No hydronephrosis. No intrarenal or ureteral calculi. The bladder is  unremarkable. Stomach/Bowel: Stomach is within normal limits. Appendix appears normal. No evidence of bowel wall thickening, distention, or inflammatory changes. Vascular/Lymphatic: Aortic atherosclerosis. No enlarged abdominal or pelvic lymph nodes. Reproductive: Mild prostatic hypertrophy. Other: No abdominal wall hernia or abnormality. No abdominopelvic ascites. Musculoskeletal: No acute bone abnormality. No lytic or blastic bone lesion. Review of the MIP images confirms the above findings. IMPRESSION: 1. Acute pulmonary embolism with moderate embolic burden. No CT evidence of right heart strain. 2. Morphologic changes in keeping with pulmonary arterial hypertension. 3. Mild cardiomegaly with biventricular enlargement. Moderate multi-vessel coronary artery calcification. 4. Patchy infiltrate within the posterior basal right lower lobe, nonspecific, possibly inflammatory or hemorrhagic in the setting of a small pulmonary infarct. 5. Heterogeneous cortical enhancement within the kidneys bilaterally which may reflect a superimposed inflammatory process such as pyelonephritis. However, the area of more focal hypoenhancement within the lower pole of the left kidney now demonstrates frank non enhancement and is more compatible with an evolving acute renal infarct. This may represent an infarct related to a paradoxical embolus given the presence of acute pulmonary embolism described above and echocardiography may be helpful to assess for the presence of an intracardiac shunt Aortic Atherosclerosis (ICD10-I70.0). Electronically Signed   By: Fidela Salisbury M.D.   On: 06/05/2022 01:23    ECHOCARDIOGRAM COMPLETE   Result Date: 06/04/2022    ECHOCARDIOGRAM REPORT   Patient Name:   Reginald Fernandez Date of Exam: 06/04/2022 Medical Rec #:  [id number]      Height:       74.0 in Accession #:    [id number]     Weight:       240.0 lb Date of Birth:  09/26/1980      BSA:          2.349 m Patient Age:    48 years       BP:            111/81 mmHg Patient Gender: M              HR:           100 bpm. Exam Location:  Inpatient Procedure: 2D Echo, Cardiac Doppler, Color Doppler and Intracardiac            Opacification Agent Indications:    NSTEMI I21.4  History:        Patient has prior history of Echocardiogram examinations, most                 recent 10/10/2021. Cardiomyopathy, CAD and Previous Myocardial                 Infarction; Risk Factors:Hypertension and Dyslipidemia. S/P                 percutaneous coronary angioplasty 11/20/2013.  Sonographer:    Wilkie Aye RVT RCS Referring Phys: [id number] [place]  1. Left ventricular ejection fraction, by estimation, is <20%. The left ventricle has severely decreased function. The left ventricle demonstrates global hypokinesis. The left ventricular internal cavity size was severely  dilated. Left ventricular diastolic parameters are consistent with Grade II diastolic dysfunction (pseudonormalization).  2. Right ventricular systolic function is severely reduced. The right ventricular size is moderately enlarged. Tricuspid regurgitation signal is inadequate for assessing PA pressure.  3. Left atrial size was mildly dilated.  4. Right atrial size was mildly dilated.  5. The mitral valve is grossly normal. Trivial mitral valve regurgitation. No evidence of mitral stenosis.  6. The aortic valve is tricuspid. Aortic valve regurgitation is not visualized. No aortic stenosis is present.  7. The inferior vena cava is normal in size with greater than 50% respiratory variability, suggesting right atrial pressure of 3 mmHg. Comparison(s): No significant change from prior study. Conclusion(s)/Recommendation(s): No left ventricular mural or apical thrombus/thrombi. FINDINGS  Left Ventricle: Left ventricular ejection fraction, by estimation, is <20%. The left ventricle has severely decreased function. The left ventricle demonstrates global hypokinesis. Definity contrast agent was given IV to  delineate the left ventricular endocardial borders. The left ventricular internal cavity size was severely dilated. There is no left ventricular hypertrophy. Left ventricular diastolic parameters are consistent with Grade II diastolic dysfunction (pseudonormalization). Right Ventricle: The right ventricular size is moderately enlarged. No increase in right ventricular wall thickness. Right ventricular systolic function is severely reduced. Tricuspid regurgitation signal is inadequate for assessing PA pressure. Left Atrium: Left atrial size was mildly dilated. Right Atrium: Right atrial size was mildly dilated. Pericardium: There is no evidence of pericardial effusion. Mitral Valve: The mitral valve is grossly normal. Trivial mitral valve regurgitation. No evidence of mitral valve stenosis. Tricuspid Valve: The tricuspid valve is grossly normal. Tricuspid valve regurgitation is trivial. No evidence of tricuspid stenosis. Aortic Valve: The aortic valve is tricuspid. Aortic valve regurgitation is not visualized. No aortic stenosis is present. Aortic valve mean gradient measures 2.0 mmHg. Aortic valve peak gradient measures 3.0 mmHg. Aortic valve area, by VTI measures 3.42 cm. Pulmonic Valve: The pulmonic valve was grossly normal. Pulmonic valve regurgitation is trivial. No evidence of pulmonic stenosis. Aorta: The aortic root and ascending aorta are structurally normal, with no evidence of dilitation. Venous: The inferior vena cava is normal in size with greater than 50% respiratory variability, suggesting right atrial pressure of 3 mmHg. IAS/Shunts: The atrial septum is grossly normal.  LEFT VENTRICLE PLAX 2D LVIDd:         6.90 cm   Diastology LVIDs:         5.80 cm   LV e' medial:    4.60 cm/s LV PW:         1.30 cm   LV E/e' medial:  13.2 LV IVS:        1.10 cm   LV e' lateral:   6.46 cm/s LVOT diam:     2.20 cm   LV E/e' lateral: 9.4 LV SV:         41 LV SV Index:   18 LVOT Area:     3.80 cm  RIGHT VENTRICLE              IVC RV Basal diam:  4.30 cm     IVC diam: 1.20 cm RV Mid diam:    3.40 cm RV S prime:     10.70 cm/s TAPSE (M-mode): 2.1 cm LEFT ATRIUM             Index        RIGHT ATRIUM           Index LA diam:  5.10 cm 2.17 cm/m   RA Area:     24.00 cm LA Vol (A2C):   94.5 ml 40.24 ml/m  RA Volume:   81.70 ml  34.79 ml/m LA Vol (A4C):   86.4 ml 36.79 ml/m LA Biplane Vol: 91.9 ml 39.13 ml/m  AORTIC VALVE                    PULMONIC VALVE AV Area (Vmax):    3.37 cm     PV Vmax:       0.68 m/s AV Area (Vmean):   3.37 cm     PV Peak grad:  1.8 mmHg AV Area (VTI):     3.42 cm AV Vmax:           87.20 cm/s AV Vmean:          67.800 cm/s AV VTI:            0.121 m AV Peak Grad:      3.0 mmHg AV Mean Grad:      2.0 mmHg LVOT Vmax:         77.40 cm/s LVOT Vmean:        60.100 cm/s LVOT VTI:          0.109 m LVOT/AV VTI ratio: 0.90  AORTA Ao Root diam: 3.50 cm Ao Asc diam:  3.50 cm Ao Arch diam: 3.4 cm MITRAL VALVE MV Area (PHT): 6.60 cm    SHUNTS MV Decel Time: 115 msec    Systemic VTI:  0.11 m MV E velocity: 60.80 cm/s  Systemic Diam: 2.20 cm MV A velocity: 43.00 cm/s MV E/A ratio:  1.41 Eleonore Chiquito MD Electronically signed by Eleonore Chiquito MD Signature Date/Time: 06/04/2022/5:22:12 PM    Final     DG Chest 2 View   Result Date: 06/03/2022 CLINICAL DATA:  Chest pain with body aches and fatigue. EXAM: CHEST - 2 VIEW COMPARISON:  June 02, 2022 FINDINGS: The cardiac silhouette is moderately enlarged and unchanged in size. There is mild stable perihilar prominence of the pulmonary vasculature. There is no evidence of an acute infiltrate, pleural effusion or pneumothorax. The visualized skeletal structures are unremarkable. IMPRESSION: Stable cardiomegaly with findings suggestive of mild pulmonary vascular congestion. Electronically Signed   By: Virgina Norfolk M.D.   On: 06/03/2022 23:36       Cardiac Studies    Echocardiogram 06/04/2022 Left Ventricle: Left ventricular ejection fraction, by  estimation, is  <20%. The left ventricle has severely decreased function. The left  ventricle demonstrates global hypokinesis. Definity contrast agent was  given IV to delineate the left ventricular  endocardial borders. The left ventricular internal cavity size was  severely dilated. There is no left ventricular hypertrophy. Left  ventricular diastolic parameters are consistent with Grade II diastolic  dysfunction (pseudonormalization).   Right Ventricle: The right ventricular size is moderately enlarged. No  increase in right ventricular wall thickness. Right ventricular systolic  function is severely reduced. Tricuspid regurgitation signal is inadequate  for assessing PA pressure.   Left Atrium: Left atrial size was mildly dilated.   Right Atrium: Right atrial size was mildly dilated.   Pericardium: There is no evidence of pericardial effusion.   Mitral Valve: The mitral valve is grossly normal. Trivial mitral valve  regurgitation. No evidence of mitral valve stenosis.   Tricuspid Valve: The tricuspid valve is grossly normal. Tricuspid valve  regurgitation is trivial. No evidence of tricuspid stenosis.   Aortic Valve: The aortic valve is  tricuspid. Aortic valve regurgitation is  not visualized. No aortic stenosis is present. Aortic valve mean gradient  measures 2.0 mmHg. Aortic valve peak gradient measures 3.0 mmHg. Aortic  valve area, by VTI measures 3.42  cm.   Pulmonic Valve: The pulmonic valve was grossly normal. Pulmonic valve  regurgitation is trivial. No evidence of pulmonic stenosis.   Aorta: The aortic root and ascending aorta are structurally normal, with  no evidence of dilitation.   Venous: The inferior vena cava is normal in size with greater than 50%  respiratory variability, suggesting right atrial pressure of 3 mmHg.   IAS/Shunts: The atrial septum is grossly normal.    Right and left heart catheterization 10/11/2021   1st Diag lesion is 60% stenosed.    Prox RCA-1 lesion is 55% stenosed.   Mid RCA lesion is 40% stenosed.   Prox RCA-2 lesion is 30% stenosed.   Mid LAD lesion is 40% stenosed.   Prox Cx to Dist Cx lesion is 50% stenosed.   Non-stenotic Prox Cx lesion was previously treated.   Non-stenotic Prox RCA to Mid RCA lesion was previously treated.   Non-stenotic Dist RCA lesion was previously treated.   Non-stenotic 1st Mrg lesion was previously treated.   1. Mildly elevated PCWP with pulmonary venous hypertension.  2. Normal RA pressure.  3. Preserved cardiac output.  4. Diffuse CAD but no severe stenoses that would explain worsening of EF.       Patient Profile     42 y.o. male with a hx of HTN, combined systolic and diastolic HF, EF 99991111 with global hypokinesis, CAD s/p multiple MI with several DES, obesity, fatty liver, former smoker who presented to the ED 3/16 initially for SOB and abdominal pain then discharged for acute on chronic CHF exacerbation with plan to diurese. He returned to ED on 3/17 for dehydration concerns. Now with new diagnosis of pulmonary embolism/DVT found on 06/04/2022 with suspected pulmonary infarct in the right lower lobe with no RV strain and acute pyelonephritis. Assessment & Plan    Elevate troponin CAD s/p LCX stents in 2015 HLD Patient initially placed on heparin drip for concerns of NSTEMI with troponins of 220.  However troponins had decreased to 44 suggesting not ACS and heparin was stopped.  Suspected to be more demand ischemia from HF. Now currently back on heparin with plans to switch to DOAC today due to new diagnosis of pulmonary embolism/DVT. Recent cardiac catheterization noted diffuse coronary artery disease, and patent stents, but no severe stenosis that would explain worsening EF.  Plan is to continue medical therapy and follow-up with outpatient. Initially was discontinued, on heparin drip however now is back on due to new diagnosis of pulmonary embolism/DVT. Plan to switch to DOAC  today per primary  Continue aspirin 81 mg, ezetimibe 10mg , and atorvastatin 80mg  Nitroglycerin as needed Has not complained of chest pain this admission   Severe chronic biventricular heart failure  HTN Severely reduced EF less than 20% with global hypokinesis and unchanged EF when comparing echo during this admission and previous echo on 09/2021.  Plan is to consider ICD if continued suboptimal EF despite maximizing medical therapy.  RN from heart failure clinic has been messaged to reach out for follow up upon discharge  Echo on 06/04/2022 noted EF less than 20%.  LV has severely decreased function and global hypokinesis with grade 2 diastolic dysfunction.  RV systolic function was severely reduced. Euvolemic on exam without any complaints of shortness of breath or  swelling Continue on Coreg 9.735 mg BID, Entresto 43-51 mg daily, Aldactone 25 mg daily, hold Farxiga 10 mg daily in the setting of suspected pyelonephritis. Continue furosemide 20 mg daily  Plan to follow heart failure clinic at discharge. Appt sch for 4/4 at 9:30    Pulmonary embolism and DVT Newly diagnosed 06/04/2022 Not complaining of any shortness of breath, vital signs stable, not requiring oxygen Currently back on heparin with plan to switch to DOAC   Acute pyelonephritis Patient complaining of diffuse abdominal and flank pain on ceftriaxone Holding farxiga for now with plan to restart at discharge      For questions or updates, please contact [place] Please consult [email or web address] for contact info under        Signed, Bonnee Quin, PA-C  06/06/2022, 9:16 AM     I have examined the patient and reviewed assessment and plan and discussed with patient.  Agree with above as stated.    Doing well.  On EL[employer]is. Has discount card.  Appears euvolemic.  Will try to arrange CHF clinic f.u with Dr. Aundra Dubin that was previously scheduled.  Larae Grooms

## 2022-06-06 NOTE — Discharge Instructions (Signed)
Information on my medicine - ELIQUIS (apixaban)  Why was Eliquis prescribed for you? Eliquis was prescribed to treat blood clots that may have been found in the veins of your legs (deep vein thrombosis) or in your lungs (pulmonary embolism) and to reduce the risk of them occurring again.  What do You need to know about Eliquis ? The starting dose is 10 mg (two 5 mg tablets) taken TWICE daily for the FIRST SEVEN (7) DAYS, then on 06/13/22  the dose is reduced to ONE 5 mg tablet taken TWICE daily.  Eliquis may be taken with or without food.   Try to take the dose about the same time in the morning and in the evening. If you have difficulty swallowing the tablet whole please discuss with your pharmacist how to take the medication safely.  Take Eliquis exactly as prescribed and DO NOT stop taking Eliquis without talking to the doctor who prescribed the medication.  Stopping may increase your risk of developing a new blood clot.  Refill your prescription before you run out.  After discharge, you should have regular check-up appointments with your healthcare provider that is prescribing your Eliquis.    What do you do if you miss a dose? If a dose of ELIQUIS is not taken at the scheduled time, take it as soon as possible on the same day and twice-daily administration should be resumed. The dose should not be doubled to make up for a missed dose.  Important Safety Information A possible side effect of Eliquis is bleeding. You should call your healthcare provider right away if you experience any of the following: Bleeding from an injury or your nose that does not stop. Unusual colored urine (red or dark brown) or unusual colored stools (red or black). Unusual bruising for unknown reasons. A serious fall or if you hit your head (even if there is no bleeding).  Some medicines may interact with Eliquis and might increase your risk of bleeding or clotting while on Eliquis. To help avoid this,  consult your healthcare provider or pharmacist prior to using any new prescription or non-prescription medications, including herbals, vitamins, non-steroidal anti-inflammatory drugs (NSAIDs) and supplements.  This website has more information on Eliquis (apixaban): http://www.eliquis.com/eliquis/home

## 2022-06-06 NOTE — Plan of Care (Signed)
  Problem: Education: Goal: Ability to demonstrate management of disease process will improve Outcome: Progressing Goal: Ability to verbalize understanding of medication therapies will improve Outcome: Progressing Goal: Individualized Educational Video(s) Outcome: Progressing   Problem: Activity: Goal: Capacity to carry out activities will improve Outcome: Progressing   Problem: Cardiac: Goal: Ability to achieve and maintain adequate cardiopulmonary perfusion will improve Outcome: Progressing   Problem: Education: Goal: Understanding of cardiac disease, CV risk reduction, and recovery process will improve Outcome: Progressing Goal: Individualized Educational Video(s) Outcome: Progressing   Problem: Education: Goal: Individualized Educational Video(s) Outcome: Progressing   Problem: Activity: Goal: Ability to tolerate increased activity will improve Outcome: Progressing   Problem: Cardiac: Goal: Ability to achieve and maintain adequate cardiovascular perfusion will improve Outcome: Progressing   Problem: Health Behavior/Discharge Planning: Goal: Ability to safely manage health-related needs after discharge will improve Outcome: Progressing

## 2022-06-06 NOTE — Progress Notes (Signed)
[name]  [id number] DOB: 12-Dec-1980 DOA: 06/03/2022 PCP: Ladell Pier, MD  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  CT chest with small pleural effusion, groundglass opacities in lower lungs concerning for edema, ?pyelonephritis. -3/18, worsening chest and abdominal pain, CTA positive for pulmonary embolism  Subjective: -Still has some pain/discomfort, constipated  Assessment and Plan:  Acute bilateral pulmonary embolism -Likely triggered by sluggish flow from biventricular failure -On IV heparin, past oral anticoagulation with patient, will start Eliquis today -Repeat echo yesterday unchanged  Acute on chronic biventricular failure -Echo 7/23 noted EF less than 20%, severely reduced RV, LHC with diffused CAD in 7/23 -poor historian, CT chest suggestive of Pulm edema, small effusion, ascites -Repeat echo without changes, initially diuresed briefly with IV Lasix, subsequently diuretics held -Continue Coreg, Entresto, Aldactone, Farxiga -Followed by cardiology, resume low-dose p.o. Lasix tomorrow if blood pressure remains stable   Hx CAD s/p MI s/p multiple DES Demand ischemia, mildly elevated troponin -Do not suspect ACS, discontinue heparin, continue aspirin, Zetia, Lipitor -Cards following, cath 7/23 noted patent stents with moderate disease in other vessels -Continue aspirin, Zetia, atorvastatin   Flank pain, back pain  -ct with ?  Pyelonephritis, no clear symptoms of UTI, UA is not impressive, has mild leukocytosis, procalcitonin <0.1 -Symptoms  may be related to PE however does not explain abnormal CT findings -empiric ceftriaxone for now, holding Farxiga, resume if cultures are negative    Hypertension  -meds as above   HLD -continue statin    Fatty liver  -no changes in liver on CT    DVT prophylaxis: Apixaban Code Status: Full code Family Communication: None present Disposition Plan: Home likely 1 to 2 days  Consultants: Cards   Procedures:   Antimicrobials:    Objective: Vitals:   06/05/22 1605 06/05/22 2133 06/06/22 0533 06/06/22 0803  BP: (!) 120/92  108/84 97/78  Pulse: 89 80 92 (!) 30  Resp: 16 16 14 16   Temp: 97.6 F (36.4 C) 97.6 F (36.4 C) 97.6 F (36.4 C) 98 F (36.7 C)  TempSrc: Oral Oral Oral Oral  SpO2: 97% 99% 100% (!) 87%  Weight:   108.8 kg   Height:        Intake/Output Summary (Last 24 hours) at 06/06/2022 1142 Last data filed at 06/06/2022 0559 Gross per 24 hour  Intake 630.68 ml  Output --  Net 630.68 ml   Filed Weights   06/03/22 2036 06/05/22 0702 06/06/22 0533  Weight: 108.9 kg 107.2 kg 108.8 kg    Examination:  General exam: Chronically ill male sitting up in bed, AAOx3 CVS: S1-S2, regular rhythm Lungs: Decreased breath sounds to bases Abdomen: Soft, nontender, bowel sounds present, mild left flank tenderness Extremities: No edema Skin: No rashes Psychiatry:  Mood & affect appropriate.     Data Reviewed:   CBC: Recent Labs  Lab 06/02/22 2047 06/03/22 2020 06/04/22 0149 06/05/22 0151 06/06/22 0543  WBC 10.2 11.1* 12.1* 12.9* 17.1*  NEUTROABS 5.8  --   --   --   --   HGB 14.3 15.1  15.3 18.1* 18.5*  HCT 42.5 44.5 46.1 51.9 53.9*  MCV 90.8 91.2 92.6 89.3 90.6  PLT 236 228 240 240 [id number]   Basic Metabolic Panel: Recent Labs  Lab 06/03/22 2020 06/03/22 2210 06/04/22 0149 06/04/22 0420 06/05/22 0151 06/06/22 0543  NA 137  --  136 138 131* 134*  K 3.9  --  4.2 4.0 3.7 4.4  CL 98  --  101 101 95* 99  CO2 27  --  24 26 23 26   GLUCOSE 128*  --  134*  107* 129* 121*  BUN 11  --  11 10 13 16   CREATININE 1.24  --  1.09 1.16 1.13 1.05  CALCIUM 8.6*  --  8.6* 8.4* 8.2* 8.1*  MG  --  1.9 2.0 1.9 2.2  --   PHOS  --   --   --  3.6  --   --    GFR: Estimated Creatinine Clearance: 121.5 mL/min (by C-G formula based on SCr of 1.05 mg/dL). Liver Function Tests: Recent Labs  Lab 06/02/22 2047 06/04/22 0149  AST 25 29  ALT 27 31  ALKPHOS 59 59  BILITOT 2.1* 3.5*  PROT 6.9 7.0  ALBUMIN 3.5 3.7   Recent Labs  Lab 06/02/22 2122  LIPASE 30   No results for input(s): "AMMONIA" in the last 168 hours. Coagulation Profile: No results for input(s): "INR", "PROTIME" in the last 168 hours. Cardiac Enzymes: No results for input(s): "CKTOTAL", "CKMB", "CKMBINDEX", "TROPONINI" in the last 168 hours. BNP (last 3 results) No results for input(s): "PROBNP" in the last 8760 hours. HbA1C: No results for input(s): "HGBA1C" in the last 72 hours. CBG: Recent Labs  Lab 06/03/22 2101  GLUCAP 126*   Lipid Profile: Recent Labs    06/04/22 0149  CHOL 176  HDL 27*  LDLCALC 135*  TRIG 72  CHOLHDL 6.5   Thyroid Function Tests: No results for input(s): "TSH", "T4TOTAL", "FREET4", "T3FREE", "THYROIDAB" in the last 72 hours. Anemia Panel: No results for input(s): "VITAMINB12", "FOLATE", "FERRITIN", "TIBC", "IRON", "RETICCTPCT" in the last 72 hours. Urine analysis:    Component Value Date/Time   COLORURINE YELLOW 06/04/2022 2102   APPEARANCEUR CLEAR 06/04/2022 2102   LABSPEC 1.006 06/04/2022 2102   PHURINE 7.0 06/04/2022 2102   GLUCOSEU >=500 (A) 06/04/2022 2102   HGBUR SMALL (A) 06/04/2022 2102   BILIRUBINUR NEGATIVE 06/04/2022 2102   [place] NEGATIVE 06/04/2022 2102   PROTEINUR 30 (A) 06/04/2022 2102   NITRITE NEGATIVE 06/04/2022 2102   LEUKOCYTESUR NEGATIVE 06/04/2022 2102   Sepsis Labs: @LABRCNTIP (procalcitonin:4,lacticidven:4)  ) Recent Results (from the past 240 hour(s))  Respiratory (~20 pathogens) panel by PCR     Status:  None   Collection Time: 06/04/22  1:46 AM   Specimen: Nasopharyngeal Swab; Respiratory  Result Value Ref Range Status   Adenovirus NOT DETECTED NOT DETECTED Final   Coronavirus 229E NOT DETECTED NOT DETECTED Final    Comment: (NOTE) The Coronavirus on the Respiratory Panel, DOES NOT test for the novel  Coronavirus (2019 nCoV)    Coronavirus HKU1 NOT DETECTED NOT DETECTED Final   Coronavirus NL63 NOT DETECTED NOT DETECTED Final   Coronavirus OC43 NOT DETECTED NOT DETECTED Final   Metapneumovirus NOT DETECTED NOT DETECTED Final   Rhinovirus / Enterovirus NOT DETECTED NOT DETECTED Final   Influenza A NOT DETECTED NOT DETECTED Final   Influenza B NOT DETECTED NOT DETECTED Final   Parainfluenza Virus 1 NOT DETECTED NOT DETECTED Final   Parainfluenza Virus 2 NOT DETECTED  NOT DETECTED Final   Parainfluenza Virus 3 NOT DETECTED NOT DETECTED Final   Parainfluenza Virus 4 NOT DETECTED NOT DETECTED Final   Respiratory Syncytial Virus NOT DETECTED NOT DETECTED Final   Bordetella pertussis NOT DETECTED NOT DETECTED Final   Bordetella Parapertussis NOT DETECTED NOT DETECTED Final   Chlamydophila pneumoniae NOT DETECTED NOT DETECTED Final   Mycoplasma pneumoniae NOT DETECTED NOT DETECTED Final    Comment: Performed at [facility], [place] [address], [place], [place] [address]     Radiology Studies: VAS [place] LOWER EXTREMITY VENOUS (DVT)  Result Date: 06/05/2022  Lower Venous DVT Study Patient Name:  Reginald Fernandez  Date of Exam:   06/05/2022 Medical Rec #: [id number]       Accession #:    [id number] Date of Birth: [birth year]       Patient Gender: M Patient Age:   42 years Exam Location:  [facility] Procedure:      VAS [place] LOWER EXTREMITY VENOUS (DVT) Referring Phys: Eugenie Norrie --------------------------------------------------------------------------------  Indications: Swelling, Pain, and pulmonary embolism.  Anticoagulation: Heparin. Comparison Study: No prior study. Performing Technologist:  McKayla Maag RVT, [place]  Examination Guidelines: A complete evaluation includes B-mode imaging, spectral Doppler, color Doppler, and power Doppler as needed of all accessible portions of each vessel. Bilateral testing is considered an integral part of a complete examination. Limited examinations for reoccurring indications may be performed as noted. The reflux portion of the exam is performed with the patient in reverse Trendelenburg.  +---------+---------------+---------+-----------+----------+--------------+ RIGHT    CompressibilityPhasicitySpontaneityPropertiesThrombus Aging +---------+---------------+---------+-----------+----------+--------------+ CFV      Full           Yes      Yes                                 +---------+---------------+---------+-----------+----------+--------------+ SFJ      Full                                                        +---------+---------------+---------+-----------+----------+--------------+ FV Prox  Full                                                        +---------+---------------+---------+-----------+----------+--------------+ FV Mid   Full                                                        +---------+---------------+---------+-----------+----------+--------------+ FV DistalFull                                                        +---------+---------------+---------+-----------+----------+--------------+ PFV      Full                                                        +---------+---------------+---------+-----------+----------+--------------+  POP      None           No       No                   Acute          +---------+---------------+---------+-----------+----------+--------------+ PTV      Full                                                        +---------+---------------+---------+-----------+----------+--------------+ PERO     Full                                                         +---------+---------------+---------+-----------+----------+--------------+   +---------+---------------+---------+-----------+----------+--------------+ LEFT     CompressibilityPhasicitySpontaneityPropertiesThrombus Aging +---------+---------------+---------+-----------+----------+--------------+ CFV      Full           Yes      Yes                                 +---------+---------------+---------+-----------+----------+--------------+ SFJ      Full                                                        +---------+---------------+---------+-----------+----------+--------------+ FV Prox  Full                                                        +---------+---------------+---------+-----------+----------+--------------+ FV Mid   Full                                                        +---------+---------------+---------+-----------+----------+--------------+ FV DistalNone           No       No                   Acute          +---------+---------------+---------+-----------+----------+--------------+ PFV      Full                                                        +---------+---------------+---------+-----------+----------+--------------+ POP      Full           Yes      Yes                                 +---------+---------------+---------+-----------+----------+--------------+  PTV      None           No       No                   Acute          +---------+---------------+---------+-----------+----------+--------------+ PERO     Full                                                        +---------+---------------+---------+-----------+----------+--------------+     Summary: RIGHT: - Findings consistent with acute deep vein thrombosis involving the right popliteal vein. - A cystic structure is found in the popliteal fossa.  LEFT: - Findings consistent with acute deep vein thrombosis involving the left femoral vein, and left  posterior tibial veins. - No cystic structure found in the popliteal fossa.  *See table(s) above for measurements and observations. Electronically signed by Deitra Mayo MD on 06/05/2022 at 4:44:24 PM.    Final    CT ABDOMEN PELVIS W CONTRAST  Result Date: 06/05/2022 CLINICAL DATA:  Hemoptysis; Abdominal pain, acute, nonlocalized EXAM: CT ANGIOGRAPHY CHEST CT ABDOMEN AND PELVIS WITH CONTRAST TECHNIQUE: Multidetector CT imaging of the chest was performed using the standard protocol during bolus administration of intravenous contrast. Multiplanar CT image reconstructions and MIPs were obtained to evaluate the vascular anatomy. Multidetector CT imaging of the abdomen and pelvis was performed using the standard protocol during bolus administration of intravenous contrast. RADIATION DOSE REDUCTION: This exam was performed according to the departmental dose-optimization program which includes automated exposure control, adjustment of the mA and/or kV according to patient size and/or use of iterative reconstruction technique. CONTRAST:  147mL OMNIPAQUE IOHEXOL 350 MG/ML SOLN COMPARISON:  CT abdomen pelvis 06/03/2022 FINDINGS: CTA CHEST FINDINGS Cardiovascular: There is adequate opacification of the pulmonary arterial tree. There are multiple branching intraluminal filling defects identified within the segmental pulmonary arteries of the a right upper lobe and posterior basal lower lobes bilaterally in keeping with acute pulmonary embolism. The embolic burden is moderate. The central pulmonary arteries are enlarged in keeping with changes of pulmonary arterial hypertension. There is no CT evidence of right heart strain with the RV/LV ratio measuring 0.9. Mild cardiomegaly is present with biventricular enlargement. Stenting of the left circumflex and right coronary arteries has been performed. Moderate multi-vessel coronary artery calcification. No pericardial effusion. No significant aortic atherosclerotic  calcification or aneurysmal dilation. Aberrant origin of the right subclavian artery noted. Mediastinum/Nodes: No enlarged mediastinal, hilar, or axillary lymph nodes. Thyroid gland, trachea, and esophagus demonstrate no significant findings. Lungs/Pleura: Patchy infiltrate within the posterior basal right lower lobe is nonspecific, possibly inflammatory or hemorrhagic in the setting of a small pulmonary infarct. The lungs are otherwise clear. No pneumothorax or pleural effusion. No central obstructing lesion. Musculoskeletal: No acute bone abnormality. No lytic or blastic bone lesion. Review of the MIP images confirms the above findings. CT ABDOMEN and PELVIS FINDINGS Hepatobiliary: No focal liver abnormality is seen. No gallstones, gallbladder wall thickening, or biliary dilatation. Pancreas: Unremarkable Spleen: Unremarkable Adrenals/Urinary Tract: The adrenal glands are unremarkable. The kidneys are normal in size and position. Mild cortical scarring noted bilaterally. There is heterogeneous cortical enhancement again identified within the kidneys bilaterally which may reflect a superimposed inflammatory process such as pyelonephritis. However, the area of more focal hypoenhancement within  the lower pole of the left kidney now demonstrates frank non enhancement and is more compatible with an acute renal infarct. This may represent a infarct related to a paradoxical embolus given the presence of acute pulmonary embolism described above. Contrast staining noted within the infarcted region. No hydronephrosis. No intrarenal or ureteral calculi. The bladder is unremarkable. Stomach/Bowel: Stomach is within normal limits. Appendix appears normal. No evidence of bowel wall thickening, distention, or inflammatory changes. Vascular/Lymphatic: Aortic atherosclerosis. No enlarged abdominal or pelvic lymph nodes. Reproductive: Mild prostatic hypertrophy. Other: No abdominal wall hernia or abnormality. No abdominopelvic  ascites. Musculoskeletal: No acute bone abnormality. No lytic or blastic bone lesion. Review of the MIP images confirms the above findings. IMPRESSION: 1. Acute pulmonary embolism with moderate embolic burden. No CT evidence of right heart strain. 2. Morphologic changes in keeping with pulmonary arterial hypertension. 3. Mild cardiomegaly with biventricular enlargement. Moderate multi-vessel coronary artery calcification. 4. Patchy infiltrate within the posterior basal right lower lobe, nonspecific, possibly inflammatory or hemorrhagic in the setting of a small pulmonary infarct. 5. Heterogeneous cortical enhancement within the kidneys bilaterally which may reflect a superimposed inflammatory process such as pyelonephritis. However, the area of more focal hypoenhancement within the lower pole of the left kidney now demonstrates frank non enhancement and is more compatible with an evolving acute renal infarct. This may represent an infarct related to a paradoxical embolus given the presence of acute pulmonary embolism described above and echocardiography may be helpful to assess for the presence of an intracardiac shunt Aortic Atherosclerosis (ICD10-I70.0). Electronically Signed   By: Fidela Salisbury M.D.   On: 06/05/2022 01:23   CT Angio Chest Pulmonary Embolism (PE) W or WO Contrast  Result Date: 06/05/2022 CLINICAL DATA:  Hemoptysis; Abdominal pain, acute, nonlocalized EXAM: CT ANGIOGRAPHY CHEST CT ABDOMEN AND PELVIS WITH CONTRAST TECHNIQUE: Multidetector CT imaging of the chest was performed using the standard protocol during bolus administration of intravenous contrast. Multiplanar CT image reconstructions and MIPs were obtained to evaluate the vascular anatomy. Multidetector CT imaging of the abdomen and pelvis was performed using the standard protocol during bolus administration of intravenous contrast. RADIATION DOSE REDUCTION: This exam was performed according to the departmental dose-optimization program  which includes automated exposure control, adjustment of the mA and/or kV according to patient size and/or use of iterative reconstruction technique. CONTRAST:  128mL OMNIPAQUE IOHEXOL 350 MG/ML SOLN COMPARISON:  CT abdomen pelvis 06/03/2022 FINDINGS: CTA CHEST FINDINGS Cardiovascular: There is adequate opacification of the pulmonary arterial tree. There are multiple branching intraluminal filling defects identified within the segmental pulmonary arteries of the a right upper lobe and posterior basal lower lobes bilaterally in keeping with acute pulmonary embolism. The embolic burden is moderate. The central pulmonary arteries are enlarged in keeping with changes of pulmonary arterial hypertension. There is no CT evidence of right heart strain with the RV/LV ratio measuring 0.9. Mild cardiomegaly is present with biventricular enlargement. Stenting of the left circumflex and right coronary arteries has been performed. Moderate multi-vessel coronary artery calcification. No pericardial effusion. No significant aortic atherosclerotic calcification or aneurysmal dilation. Aberrant origin of the right subclavian artery noted. Mediastinum/Nodes: No enlarged mediastinal, hilar, or axillary lymph nodes. Thyroid gland, trachea, and esophagus demonstrate no significant findings. Lungs/Pleura: Patchy infiltrate within the posterior basal right lower lobe is nonspecific, possibly inflammatory or hemorrhagic in the setting of a small pulmonary infarct. The lungs are otherwise clear. No pneumothorax or pleural effusion. No central obstructing lesion. Musculoskeletal: No acute bone abnormality. No lytic or  blastic bone lesion. Review of the MIP images confirms the above findings. CT ABDOMEN and PELVIS FINDINGS Hepatobiliary: No focal liver abnormality is seen. No gallstones, gallbladder wall thickening, or biliary dilatation. Pancreas: Unremarkable Spleen: Unremarkable Adrenals/Urinary Tract: The adrenal glands are unremarkable.  The kidneys are normal in size and position. Mild cortical scarring noted bilaterally. There is heterogeneous cortical enhancement again identified within the kidneys bilaterally which may reflect a superimposed inflammatory process such as pyelonephritis. However, the area of more focal hypoenhancement within the lower pole of the left kidney now demonstrates frank non enhancement and is more compatible with an acute renal infarct. This may represent a infarct related to a paradoxical embolus given the presence of acute pulmonary embolism described above. Contrast staining noted within the infarcted region. No hydronephrosis. No intrarenal or ureteral calculi. The bladder is unremarkable. Stomach/Bowel: Stomach is within normal limits. Appendix appears normal. No evidence of bowel wall thickening, distention, or inflammatory changes. Vascular/Lymphatic: Aortic atherosclerosis. No enlarged abdominal or pelvic lymph nodes. Reproductive: Mild prostatic hypertrophy. Other: No abdominal wall hernia or abnormality. No abdominopelvic ascites. Musculoskeletal: No acute bone abnormality. No lytic or blastic bone lesion. Review of the MIP images confirms the above findings. IMPRESSION: 1. Acute pulmonary embolism with moderate embolic burden. No CT evidence of right heart strain. 2. Morphologic changes in keeping with pulmonary arterial hypertension. 3. Mild cardiomegaly with biventricular enlargement. Moderate multi-vessel coronary artery calcification. 4. Patchy infiltrate within the posterior basal right lower lobe, nonspecific, possibly inflammatory or hemorrhagic in the setting of a small pulmonary infarct. 5. Heterogeneous cortical enhancement within the kidneys bilaterally which may reflect a superimposed inflammatory process such as pyelonephritis. However, the area of more focal hypoenhancement within the lower pole of the left kidney now demonstrates frank non enhancement and is more compatible with an evolving  acute renal infarct. This may represent an infarct related to a paradoxical embolus given the presence of acute pulmonary embolism described above and echocardiography may be helpful to assess for the presence of an intracardiac shunt Aortic Atherosclerosis (ICD10-I70.0). Electronically Signed   By: Fidela Salisbury M.D.   On: 06/05/2022 01:23   ECHOCARDIOGRAM COMPLETE  Result Date: 06/04/2022    ECHOCARDIOGRAM REPORT   Patient Name:   Reginald Fernandez Date of Exam: 06/04/2022 Medical Rec #:  [id number]      Height:       74.0 in Accession #:    [id number]     Weight:       240.0 lb Date of Birth:  28-Aug-1980      BSA:          2.349 m Patient Age:    42 years       BP:           111/81 mmHg Patient Gender: M              HR:           100 bpm. Exam Location:  Inpatient Procedure: 2D Echo, Cardiac Doppler, Color Doppler and Intracardiac            Opacification Agent Indications:    NSTEMI I21.4  History:        Patient has prior history of Echocardiogram examinations, most                 recent 10/10/2021. Cardiomyopathy, CAD and Previous Myocardial                 Infarction; Risk Factors:Hypertension and Dyslipidemia. S/P  percutaneous coronary angioplasty 11/20/2013.  Sonographer:    Wilkie Aye RVT RCS Referring Phys: [id number] [place]  1. Left ventricular ejection fraction, by estimation, is <20%. The left ventricle has severely decreased function. The left ventricle demonstrates global hypokinesis. The left ventricular internal cavity size was severely dilated. Left ventricular diastolic parameters are consistent with Grade II diastolic dysfunction (pseudonormalization).  2. Right ventricular systolic function is severely reduced. The right ventricular size is moderately enlarged. Tricuspid regurgitation signal is inadequate for assessing PA pressure.  3. Left atrial size was mildly dilated.  4. Right atrial size was mildly dilated.  5. The mitral valve is grossly normal.  Trivial mitral valve regurgitation. No evidence of mitral stenosis.  6. The aortic valve is tricuspid. Aortic valve regurgitation is not visualized. No aortic stenosis is present.  7. The inferior vena cava is normal in size with greater than 50% respiratory variability, suggesting right atrial pressure of 3 mmHg. Comparison(s): No significant change from prior study. Conclusion(s)/Recommendation(s): No left ventricular mural or apical thrombus/thrombi. FINDINGS  Left Ventricle: Left ventricular ejection fraction, by estimation, is <20%. The left ventricle has severely decreased function. The left ventricle demonstrates global hypokinesis. Definity contrast agent was given IV to delineate the left ventricular endocardial borders. The left ventricular internal cavity size was severely dilated. There is no left ventricular hypertrophy. Left ventricular diastolic parameters are consistent with Grade II diastolic dysfunction (pseudonormalization). Right Ventricle: The right ventricular size is moderately enlarged. No increase in right ventricular wall thickness. Right ventricular systolic function is severely reduced. Tricuspid regurgitation signal is inadequate for assessing PA pressure. Left Atrium: Left atrial size was mildly dilated. Right Atrium: Right atrial size was mildly dilated. Pericardium: There is no evidence of pericardial effusion. Mitral Valve: The mitral valve is grossly normal. Trivial mitral valve regurgitation. No evidence of mitral valve stenosis. Tricuspid Valve: The tricuspid valve is grossly normal. Tricuspid valve regurgitation is trivial. No evidence of tricuspid stenosis. Aortic Valve: The aortic valve is tricuspid. Aortic valve regurgitation is not visualized. No aortic stenosis is present. Aortic valve mean gradient measures 2.0 mmHg. Aortic valve peak gradient measures 3.0 mmHg. Aortic valve area, by VTI measures 3.42 cm. Pulmonic Valve: The pulmonic valve was grossly normal. Pulmonic  valve regurgitation is trivial. No evidence of pulmonic stenosis. Aorta: The aortic root and ascending aorta are structurally normal, with no evidence of dilitation. Venous: The inferior vena cava is normal in size with greater than 50% respiratory variability, suggesting right atrial pressure of 3 mmHg. IAS/Shunts: The atrial septum is grossly normal.  LEFT VENTRICLE PLAX 2D LVIDd:         6.90 cm   Diastology LVIDs:         5.80 cm   LV e' medial:    4.60 cm/s LV PW:         1.30 cm   LV E/e' medial:  13.2 LV IVS:        1.10 cm   LV e' lateral:   6.46 cm/s LVOT diam:     2.20 cm   LV E/e' lateral: 9.4 LV SV:         41 LV SV Index:   18 LVOT Area:     3.80 cm  RIGHT VENTRICLE             IVC RV Basal diam:  4.30 cm     IVC diam: 1.20 cm RV Mid diam:    3.40 cm RV S prime:  10.70 cm/s TAPSE (M-mode): 2.1 cm LEFT ATRIUM             Index        RIGHT ATRIUM           Index LA diam:        5.10 cm 2.17 cm/m   RA Area:     24.00 cm LA Vol (A2C):   94.5 ml 40.24 ml/m  RA Volume:   81.70 ml  34.79 ml/m LA Vol (A4C):   86.4 ml 36.79 ml/m LA Biplane Vol: 91.9 ml 39.13 ml/m  AORTIC VALVE                    PULMONIC VALVE AV Area (Vmax):    3.37 cm     PV Vmax:       0.68 m/s AV Area (Vmean):   3.37 cm     PV Peak grad:  1.8 mmHg AV Area (VTI):     3.42 cm AV Vmax:           87.20 cm/s AV Vmean:          67.800 cm/s AV VTI:            0.121 m AV Peak Grad:      3.0 mmHg AV Mean Grad:      2.0 mmHg LVOT Vmax:         77.40 cm/s LVOT Vmean:        60.100 cm/s LVOT VTI:          0.109 m LVOT/AV VTI ratio: 0.90  AORTA Ao Root diam: 3.50 cm Ao Asc diam:  3.50 cm Ao Arch diam: 3.4 cm MITRAL VALVE MV Area (PHT): 6.60 cm    SHUNTS MV Decel Time: 115 msec    Systemic VTI:  0.11 m MV E velocity: 60.80 cm/s  Systemic Diam: 2.20 cm MV A velocity: 43.00 cm/s MV E/A ratio:  1.41 Eleonore Chiquito MD Electronically signed by Eleonore Chiquito MD Signature Date/Time: 06/04/2022/5:22:12 PM    Final      Scheduled Meds:  apixaban   10 mg Oral BID   Followed by   Derrill Memo ON 06/13/2022] apixaban  5 mg Oral BID   aspirin EC  81 mg Oral Daily   atorvastatin  80 mg Oral q1800   carvedilol  9.375 mg Oral BID WC   ezetimibe  10 mg Oral Daily   polyethylene glycol  17 g Oral Daily   sacubitril-valsartan  1 tablet Oral BID   senna-docusate  1 tablet Oral BID   spironolactone  25 mg Oral Daily   Continuous Infusions:  cefTRIAXone (ROCEPHIN)  IV 1 g (06/05/22 1414)     LOS: 2 days    Time spent:11min    Domenic Polite, MD [facility]   06/06/2022, 11:42 AM

## 2022-06-06 NOTE — Progress Notes (Signed)
ANTICOAGULATION CONSULT NOTE - Follow Up Consult  Pharmacy Consult for heparin Indication: pulmonary embolus  Labs: Recent Labs    06/03/22 2210 06/03/22 2332 06/04/22 0149 06/04/22 0420 06/04/22 [id number] 06/05/22 0151 06/05/22 0903 06/05/22 1733 06/06/22 0543  HGB  --   --  15.3  --   --  18.1*  --   --  18.5*  HCT  --   --  46.1  --   --  51.9  --   --  53.9*  PLT  --   --  240  --   --  240  --   --  284  HEPARINUNFRC  --   --   --   --    < >  --  0.17* <0.10* 0.77*  CREATININE  --   --  1.09 1.16  --  1.13  --   --  1.05  TROPONINIHS 220* 44*  --   --   --   --   --   --   --    < > = values in this interval not displayed.    Assessment: 42yo male supratherapeutic on heparin after rate change; no infusion issues or signs of bleeding per RN.  Goal of Therapy:  Heparin level 0.3-0.7 units/ml   Plan:  Will decrease heparin infusion by 2 units/kg/hr to 2100 units/hr and check level in 6 hours.  Noted plan to possibly start [place] today.  Wynona Neat, PharmD, BCPS  06/06/2022,6:27 AM

## 2022-06-06 NOTE — Progress Notes (Signed)
[place] for heparin Indication: pulmonary embolus  Allergies  Allergen Reactions   Penicillins Other (See Comments)    Childhood allergy Unknown reaction  Did it involve swelling of the face/tongue/throat, SOB, or low BP? No Did it involve sudden or severe rash/hives, skin peeling, or any reaction on the inside of your mouth or nose? No Did you need to seek medical attention at a hospital or doctor's office? No When did it last happen?      child If all above answers are "NO", may proceed with cephalosporin use.    Patient Measurements: Height: 6\' 2"  (188 cm) Weight: 108.8 kg (239 lb 14.4 oz) IBW/kg (Calculated) : 82.2 Heparin Dosing Weight: 105kg  Vital Signs: Temp: 98 F (36.7 C) (03/20 0803) Temp Source: Oral (03/20 0803) BP: 97/78 (03/20 0803) Pulse Rate: 30 (03/20 0803)  Labs: Recent Labs    06/03/22 2210 06/03/22 2332 06/04/22 0149 06/04/22 0420 06/04/22 [id number] 06/05/22 0151 06/05/22 0903 06/05/22 1733 06/06/22 0543  HGB  --   --  15.3  --   --  18.1*  --   --  18.5*  HCT  --   --  46.1  --   --  51.9  --   --  53.9*  PLT  --   --  240  --   --  240  --   --  284  HEPARINUNFRC  --   --   --   --    < >  --  0.17* <0.10* 0.77*  CREATININE  --   --  1.09 1.16  --  1.13  --   --  1.05  TROPONINIHS 220* 44*  --   --   --   --   --   --   --    < > = values in this interval not displayed.     Estimated Creatinine Clearance: 121.5 mL/min (by C-G formula based on SCr of 1.05 mg/dL).   Medical History: Past Medical History:  Diagnosis Date   CAD S/P percutaneous coronary angioplasty 11/20/2013   a. 09/2009: EKG with Inf STEMI - no obstructive CAD;  b. 11/2013 [place] STEMI/PCI: LM nl, LAD 20p, D1 sm - diff dzs, D2 large - nl, LCX 95-99, OM1 100 (3.5x38 Promus Premier DES), RCA 95-87m, 80d (3.0x20 and 3.0x16 Promus Premier DES') - normal EF;  c. NSTEMI 5/18 - patent stents, otw minimal CAD.- EF by Echo 35-40%; d. NSTEMI  10/2018 - ? culprit - Small branch of  D2,Med Rx,.  EF by Echo ~25%   Cardiomyopathy, ischemic 07/2016   h/o Inf STEMI 11/2013 - EF was "Normal"; b) NSTEMI 07/2016 (NO Cultprit on Cath) - Echo EF 35-40% (Basal Ant-Lat HK, basal-mid Inferolateral & apical Akinesis); c) NSTEMI 10/2018 -felt to be occluded small branch of D2, Echo EF further decreased to 20-25%.    Essential hypertension    Hyperlipidemia with target LDL less than 100    Marijuana abuse    Migraine    Morbid obesity (HCC)    ST-segment elevation myocardial infarction (STEMI) of inferior wall ([place]) 11/20/2013    Medications:  Medications Prior to Admission  Medication Sig Dispense Refill Last Dose   acetaminophen (TYLENOL) 500 MG tablet Take 1,000 mg by mouth 2 (two) times daily as needed for moderate pain or headache.   Past Week   aspirin EC 81 MG tablet Take 1 tablet (81 mg total) by mouth daily. 30 tablet 5 06/02/2022  at am   atorvastatin (LIPITOR) 80 MG tablet Take 1 tablet (80 mg total) by mouth daily at 6 PM 90 tablet 3 06/02/2022 at am   carvedilol (COREG) 6.25 MG tablet Take 1.5 tablets (9.375 mg total) by mouth 2 (two) times daily with a meal. 90 tablet 5 06/02/2022 at 1000   dapagliflozin propanediol (FARXIGA) 10 MG TABS tablet Take 1 tablet (10 mg total) by mouth daily. 30 tablet 5 06/02/2022 at am   ezetimibe (ZETIA) 10 MG tablet Take 1 tablet (10 mg total) by mouth daily. 30 tablet 5 06/02/2022   furosemide (LASIX) 20 MG tablet Take 1 tablet (20 mg total) by mouth daily. 30 tablet 5 06/03/2022 at 0800   nitroGLYCERIN (NITROSTAT) 0.4 MG SL tablet Place 1 tablet (0.4 mg total) under the tongue every 5 (five) minutes as needed for chest pain. 25 tablet 2 UNKNOWN   sacubitril-valsartan (ENTRESTO) 49-51 MG Take 1 tablet by mouth 2 (two) times daily. 180 tablet 3 06/03/2022 at 0800   spironolactone (ALDACTONE) 25 MG tablet Take 1 tablet (25 mg total) by mouth daily. 30 tablet 5 06/02/2022 at am   Scheduled:   aspirin EC  81 mg Oral  Daily   atorvastatin  80 mg Oral q1800   carvedilol  9.375 mg Oral BID WC   dapagliflozin propanediol  10 mg Oral Daily   ezetimibe  10 mg Oral Daily   polyethylene glycol  17 g Oral Daily   sacubitril-valsartan  1 tablet Oral BID   senna-docusate  1 tablet Oral BID   spironolactone  25 mg Oral Daily   Infusions:   cefTRIAXone (ROCEPHIN)  IV 1 g (06/05/22 1414)    Assessment: 42yo male admitted 3/17 for CP and started on heparin which was d/c'd when it was thought that the troponin trend was d/t HF exacerbation; c/o acute abdominal pain 3/18 pm associated w/ straining for BM; he then coughed up a quarter-sized amount of blood >> pt sent for CTA which reveals PE and renal infarct possibly d/t paradoxical embolism >> on IV heparin -doppler positive for bilateral DVT -plans are to start apixaban   Goal of Therapy:  Heparin level 0.3-0.7 units/ml Monitor platelets by anticoagulation protocol: Yes   Plan:  -Discontinue heparin -Apixaban 10mg  po bid for 7 days folloed by 5mg  po bid  Hildred Laser, PharmD Clinical Pharmacist **Pharmacist phone directory can now be found on [email or web address] ([email or web address]).  Listed under [place].

## 2022-06-07 ENCOUNTER — Other Ambulatory Visit (HOSPITAL_COMMUNITY): Payer: Self-pay

## 2022-06-07 ENCOUNTER — Inpatient Hospital Stay (HOSPITAL_COMMUNITY): Payer: BLUE CROSS/BLUE SHIELD

## 2022-06-07 DIAGNOSIS — I252 Old myocardial infarction: Secondary | ICD-10-CM | POA: Diagnosis not present

## 2022-06-07 DIAGNOSIS — N10 Acute tubulo-interstitial nephritis: Secondary | ICD-10-CM | POA: Insufficient documentation

## 2022-06-07 DIAGNOSIS — Z9861 Coronary angioplasty status: Secondary | ICD-10-CM

## 2022-06-07 DIAGNOSIS — I2699 Other pulmonary embolism without acute cor pulmonale: Secondary | ICD-10-CM | POA: Diagnosis not present

## 2022-06-07 DIAGNOSIS — R079 Chest pain, unspecified: Secondary | ICD-10-CM

## 2022-06-07 DIAGNOSIS — I5023 Acute on chronic systolic (congestive) heart failure: Secondary | ICD-10-CM | POA: Diagnosis not present

## 2022-06-07 DIAGNOSIS — R7989 Other specified abnormal findings of blood chemistry: Secondary | ICD-10-CM | POA: Diagnosis not present

## 2022-06-07 DIAGNOSIS — I2510 Atherosclerotic heart disease of native coronary artery without angina pectoris: Secondary | ICD-10-CM

## 2022-06-07 DIAGNOSIS — I2489 Other forms of acute ischemic heart disease: Secondary | ICD-10-CM

## 2022-06-07 LAB — COMPREHENSIVE METABOLIC PANEL
ALT: 25 U/L (ref 0–44)
AST: 23 U/L (ref 15–41)
Albumin: 3.1 g/dL — ABNORMAL LOW (ref 3.5–5.0)
Alkaline Phosphatase: 62 U/L (ref 38–126)
Anion gap: 8 (ref 5–15)
BUN: 16 mg/dL (ref 6–20)
CO2: 23 mmol/L (ref 22–32)
Calcium: 8.2 mg/dL — ABNORMAL LOW (ref 8.9–10.3)
Chloride: 103 mmol/L (ref 98–111)
Creatinine, Ser: 1.02 mg/dL (ref 0.61–1.24)
GFR, Estimated: >60 mL/min (ref >60)
Glucose, Bld: 118 mg/dL — ABNORMAL HIGH (ref 70–99)
Potassium: 4.6 mmol/L (ref 3.5–5.1)
Sodium: 134 mmol/L — ABNORMAL LOW (ref 135–145)
Total Bilirubin: 1.3 mg/dL — ABNORMAL HIGH (ref 0.3–1.2)
Total Protein: 6.7 g/dL (ref 6.5–8.1)

## 2022-06-07 LAB — BASIC METABOLIC PANEL
Anion gap: 11 (ref 5–15)
BUN: 16 mg/dL (ref 6–20)
CO2: 20 mmol/L — ABNORMAL LOW (ref 22–32)
Calcium: 7.6 mg/dL — ABNORMAL LOW (ref 8.9–10.3)
Chloride: 102 mmol/L (ref 98–111)
Creatinine, Ser: 1.03 mg/dL (ref 0.61–1.24)
GFR, Estimated: >60 mL/min (ref >60)
Glucose, Bld: 146 mg/dL — ABNORMAL HIGH (ref 70–99)
Potassium: 4.4 mmol/L (ref 3.5–5.1)
Sodium: 133 mmol/L — ABNORMAL LOW (ref 135–145)

## 2022-06-07 LAB — CBC
HCT: 47.2 % (ref 39.0–52.0)
Hemoglobin: 15.6 g/dL (ref 13.0–17.0)
MCH: 30.4 pg (ref 26.0–34.0)
MCHC: 33.1 g/dL (ref 30.0–36.0)
MCV: 91.8 fL (ref 80.0–100.0)
Platelets: 249 10*3/uL (ref 150–400)
RBC: 5.14 MIL/uL (ref 4.22–5.81)
RDW: 13.2 % (ref 11.5–15.5)
WBC: 12.5 10*3/uL — ABNORMAL HIGH (ref 4.0–10.5)
nRBC: 0 % (ref 0.0–0.2)

## 2022-06-07 LAB — TROPONIN I (HIGH SENSITIVITY)
Troponin I (High Sensitivity): 16 ng/L (ref <18)
Troponin I (High Sensitivity): 58 ng/L — ABNORMAL HIGH (ref <18)

## 2022-06-07 LAB — LIPASE, BLOOD: Lipase: 31 U/L (ref 11–51)

## 2022-06-07 MED ORDER — ALUM & MAG HYDROXIDE-SIMETH 200-200-20 MG/5ML PO SUSP
30.0000 mL | Freq: Three times a day (TID) | ORAL | Status: DC | PRN
  Administered 2022-06-08: 30 mL via ORAL
  Filled 2022-06-07: qty 30

## 2022-06-07 MED ORDER — CARVEDILOL 3.125 MG PO TABS
9.3750 mg | ORAL_TABLET | Freq: Two times a day (BID) | ORAL | 2 refills | Status: DC
  Filled 2022-06-07: qty 180, 30d supply, fill #0
  Filled 2023-03-18: qty 180, 30d supply, fill #1

## 2022-06-07 MED ORDER — ALUM & MAG HYDROXIDE-SIMETH 200-200-20 MG/5ML PO SUSP
30.0000 mL | Freq: Once | ORAL | Status: AC
  Administered 2022-06-07: 30 mL via ORAL
  Filled 2022-06-07: qty 30

## 2022-06-07 MED ORDER — PANTOPRAZOLE SODIUM 40 MG PO TBEC
40.0000 mg | DELAYED_RELEASE_TABLET | Freq: Every day | ORAL | Status: DC
  Administered 2022-06-07 – 2022-06-08 (×2): 40 mg via ORAL
  Filled 2022-06-07 (×2): qty 1

## 2022-06-07 MED ORDER — FUROSEMIDE 20 MG PO TABS
20.0000 mg | ORAL_TABLET | Freq: Every day | ORAL | Status: DC
  Administered 2022-06-07 – 2022-06-08 (×2): 20 mg via ORAL
  Filled 2022-06-07 (×2): qty 1

## 2022-06-07 MED ORDER — ACETAMINOPHEN 325 MG PO TABS: 650.0000 mg | ORAL_TABLET | Freq: Four times a day (QID) | ORAL | Status: AC | PRN

## 2022-06-07 MED ORDER — APIXABAN 5 MG PO TABS
5.0000 mg | ORAL_TABLET | Freq: Two times a day (BID) | ORAL | 2 refills | Status: DC
  Filled 2022-06-07 – 2022-07-30 (×2): qty 60, 30d supply, fill #0

## 2022-06-07 MED ORDER — LINEZOLID 600 MG PO TABS
600.0000 mg | ORAL_TABLET | Freq: Two times a day (BID) | ORAL | Status: DC
  Administered 2022-06-07 – 2022-06-08 (×3): 600 mg via ORAL
  Filled 2022-06-07 (×3): qty 1

## 2022-06-07 MED ORDER — SENNOSIDES-DOCUSATE SODIUM 8.6-50 MG PO TABS: 1.0000 | ORAL_TABLET | Freq: Two times a day (BID) | ORAL | Status: AC

## 2022-06-07 MED ORDER — APIXABAN (ELIQUIS) VTE STARTER PACK (10MG AND 5MG)
ORAL_TABLET | ORAL | 0 refills | Status: DC
  Filled 2022-06-07 (×2): qty 74, 30d supply, fill #0

## 2022-06-07 MED ORDER — DAPAGLIFLOZIN PROPANEDIOL 10 MG PO TABS: 10.0000 mg | ORAL_TABLET | Freq: Every day | ORAL | 5 refills | Status: DC

## 2022-06-07 MED ORDER — LINEZOLID 600 MG PO TABS
600.0000 mg | ORAL_TABLET | Freq: Two times a day (BID) | ORAL | 0 refills | Status: AC
  Filled 2022-06-07 (×2): qty 14, 7d supply, fill #0

## 2022-06-07 NOTE — Progress Notes (Signed)
PROGRESS NOTE  Reginald Fernandez [id number] DOB: 10/14/1980   PCP: Ladell Pier, MD  Patient is from: Home  DOA: 06/03/2022 LOS: 3  Chief complaints Chief Complaint  Patient presents with   Weakness     Brief Narrative / Interim history: 42 year old M with severe BiV failure/ICM, EF < 20%, CAD with multiple MI s/p DES stents, fatty liver disease, obesity and former tobacco smoker return to ED due to ongoing shortness of breath, abdominal pain and feeling poorly initially admitted with CHF exacerbation and started on IV Lasix.  Cardiology consulted.  Later, found to have acute PE and started on IV heparin.  CT abdomen and pelvis also raise concern for for acute pyelonephritis.  Lower extremity venous Doppler positive for bilateral DVT.  Urine culture obtained.  Started on IV ceftriaxone.  Urine culture with Enterococcus faecalis.  Antibiotics changed to Zyvox after discussion with pharmacy.  Patient was cleared for discharge by cardiology on 3/20.  He was discharged on 3/21 but developed chest pain radiating to his back with diaphoresis.    Subjective: Seen and examined earlier this morning and later this afternoon.  Patient had left-sided low back pain early in the morning that has improved with pain medication.  His pain was 2/10 at the time of my evaluation.  He was to be discharged on Eliquis and Zyvox when he suddenly developed substernal chest pain after eating meals.  He describes the pain as burning different from his chest pain when he had heart attack.  Pain radiating to his back.  He had associated diaphoresis but no shortness of breath.  Vital signs stable.  Denies nausea or vomiting.  Hemodynamically stable.  Objective: Vitals:   06/07/22 0500 06/07/22 0734 06/07/22 1442 06/07/22 2036  BP: (!) 83/64 94/60 117/85 (!) 115/90  Pulse:  79  91  Resp: 18 18  20   Temp: 98.5 F (36.9 C) 98.6 F (37 C)  99.1 F (37.3 C)  TempSrc: Oral Oral  Oral  SpO2:  100%    Weight:  109.9 kg     Height:        Examination:  GENERAL: No apparent distress.  Nontoxic. HEENT: MMM.  Vision and hearing grossly intact.  NECK: Supple.  No apparent JVD.  RESP:  No IWOB.  Fair aeration bilaterally. CVS:  RRR. Heart sounds normal.  ABD/GI/GU: BS+. Abd soft, NTND.  No CVA tenderness.  Tenderness over left lower back MSK/EXT:  Moves extremities. No apparent deformity. No edema.  SKIN: no apparent skin lesion or wound NEURO: Awake, alert and oriented appropriately.  No apparent focal neuro deficit. PSYCH: Calm. Normal affect.   Procedures:  None  Microbiology summarized: Full RVP nonreactive Urine culture with Enterococcus faecalis  Assessment and plan: Principal Problem:   Acute pulmonary embolism (HCC) Active Problems:   Chronic combined systolic and diastolic heart failure (HCC)   History of acute inferior wall MI   CAD S/P percutaneous coronary angioplasty: DES PCI -Cx-OM1, RCA x 2   Demand ischemia of myocardium   Elevated troponin   Acute on chronic systolic heart failure (HCC)   Acute pyelonephritis   Acute bilateral pulmonary embolism/bilateral DVT: CT angio chest showed acute PE with moderate embolic burden without evidence of right heart strain, PAH, mild cardiomegaly.  Lower extremity venous Doppler with bilateral DVT.  TTE with LVEF <20%, global hypokinesis, G2-DD and severely reduced RV SF.  Initially started on IV heparin.  Transition to starter pack Eliquis. -Continue starter pack Eliquis  Acute on chronic biventricular heart failure: TTE as above.  Initially diuresed with IV Lasix. -Continue Coreg, Entresto and Aldactone -Start p.o. Lasix. -Strict intake and output, daily weight, renal functions and electrolytes. -Cardiology signed off -Outpatient follow-up at heart failure clinic  Chest pain/history of CAD s/p multiple DES: Sudden onset chest pain prior to discharge.  Description of the pain and temporal association with diet suggestive of GI  etiology.  He also looks anxious about PE.  EKG with nonspecific T wave changes.  Initial troponin 18.  Repeat troponin trended to 58.  CXR with cardiomegaly and atelectasis.  No respiratory distress.  -Continue cycling troponin -Check CMP and lipase.  May consider RUQ [place] or CT based on results. -GI cocktail and PPI -Educated by [employer].  Cardiology PA at bedside as well -Continue Coreg, Lipitor, Zetia, aspirin  Flank pain/possible pyelonephritis: Urine culture with Enterococcus faecalis.  He has no CVA tenderness but TTP over left lower back.  -Changed ceftriaxone to Zyvox after discussion with pharmacy.  He has penicillin allergy is.  Hypertension: Borderline BP. -Cardiac meds as above  HLD: Continue statin.  Fatty liver: No changes on CT.  Obesity Body mass index is 31.11 kg/m.          DVT prophylaxis:   apixaban (ELIQUIS) tablet 10 mg  apixaban (ELIQUIS) tablet 5 mg  Code Status: Full code Family Communication: None at bedside Level of care: Progressive Status is: Inpatient Remains inpatient appropriate because: Chest pain, PE and CHF   Final disposition: Home Consultants:  Cardiology  55 minutes with more than 50% spent in reviewing records, counseling patient/family and coordinating care.   Sch Meds:  Scheduled Meds:  apixaban  10 mg Oral BID   Followed by   Derrill Memo ON 06/13/2022] apixaban  5 mg Oral BID   aspirin EC  81 mg Oral Daily   atorvastatin  80 mg Oral q1800   carvedilol  9.375 mg Oral BID WC   ezetimibe  10 mg Oral Daily   linezolid  600 mg Oral Q12H   polyethylene glycol  17 g Oral Daily   sacubitril-valsartan  1 tablet Oral BID   senna-docusate  1 tablet Oral BID   spironolactone  25 mg Oral Daily   Continuous Infusions: PRN Meds:.acetaminophen **OR** acetaminophen, dicyclomine, levalbuterol, magnesium hydroxide, morphine injection, nitroGLYCERIN, nitroGLYCERIN, ondansetron **OR** ondansetron (ZOFRAN) IV, ondansetron (ZOFRAN) IV,  oxyCODONE-acetaminophen  Antimicrobials: Anti-infectives (From admission, onward)    Start     Dose/Rate Route Frequency Ordered Stop   06/07/22 1515  linezolid (ZYVOX) tablet 600 mg        600 mg Oral Every 12 hours 06/07/22 1426 06/14/22 0959   06/07/22 0000  linezolid (ZYVOX) 600 MG tablet        600 mg Oral Every 12 hours 06/07/22 1431 06/14/22 2359   06/04/22 1430  cefTRIAXone (ROCEPHIN) 1 g in sodium chloride 0.9 % 100 mL IVPB  Status:  Discontinued        1 g 200 mL/hr over 30 Minutes Intravenous Every 24 hours 06/04/22 1334 06/07/22 1158        I have personally reviewed the following labs and images: CBC: Recent Labs  Lab 06/02/22 2047 06/03/22 2020 06/04/22 0149 06/05/22 0151 06/06/22 0543 06/07/22 0137  WBC 10.2 11.1* 12.1* 12.9* 17.1* 12.5*  NEUTROABS 5.8  --   --   --   --   --   HGB 14.3 15.1 15.3 18.1* 18.5* 15.6  HCT 42.5 44.5 46.1 51.9 53.9*  47.2  MCV 90.8 91.2 92.6 89.3 90.6 91.8  PLT 236 228 240 240 284 249   BMP &GFR Recent Labs  Lab 06/03/22 2210 06/04/22 0149 06/04/22 0420 06/05/22 0151 06/06/22 0543 06/07/22 0137  NA  --  136 138 131* 134* 133*  K  --  4.2 4.0 3.7 4.4 4.4  CL  --  101 101 95* 99 102  CO2  --  24 26 23 26  20*  GLUCOSE  --  134* 107* 129* 121* 146*  BUN  --  11 10 13 16 16   CREATININE  --  1.09 1.16 1.13 1.05 1.03  CALCIUM  --  8.6* 8.4* 8.2* 8.1* 7.6*  MG 1.9 2.0 1.9 2.2  --   --   PHOS  --   --  3.6  --   --   --    Estimated Creatinine Clearance: 124.6 mL/min (by C-G formula based on SCr of 1.03 mg/dL). Liver & Pancreas: Recent Labs  Lab 06/02/22 2047 06/04/22 0149  AST 25 29  ALT 27 31  ALKPHOS 59 59  BILITOT 2.1* 3.5*  PROT 6.9 7.0  ALBUMIN 3.5 3.7   Recent Labs  Lab 06/02/22 2122  LIPASE 30   No results for input(s): "AMMONIA" in the last 168 hours. Diabetic: No results for input(s): "HGBA1C" in the last 72 hours. Recent Labs  Lab 06/03/22 2101  GLUCAP 126*   Cardiac Enzymes: No results for  input(s): "CKTOTAL", "CKMB", "CKMBINDEX", "TROPONINI" in the last 168 hours. No results for input(s): "PROBNP" in the last 8760 hours. Coagulation Profile: No results for input(s): "INR", "PROTIME" in the last 168 hours. Thyroid Function Tests: No results for input(s): "TSH", "T4TOTAL", "FREET4", "T3FREE", "THYROIDAB" in the last 72 hours. Lipid Profile: No results for input(s): "CHOL", "HDL", "LDLCALC", "TRIG", "CHOLHDL", "LDLDIRECT" in the last 72 hours. Anemia Panel: No results for input(s): "VITAMINB12", "FOLATE", "FERRITIN", "TIBC", "IRON", "RETICCTPCT" in the last 72 hours. Urine analysis:    Component Value Date/Time   COLORURINE YELLOW 06/04/2022 2102   APPEARANCEUR CLEAR 06/04/2022 2102   LABSPEC 1.006 06/04/2022 2102   PHURINE 7.0 06/04/2022 2102   GLUCOSEU >=500 (A) 06/04/2022 2102   HGBUR SMALL (A) 06/04/2022 2102   BILIRUBINUR NEGATIVE 06/04/2022 2102   [place] NEGATIVE 06/04/2022 2102   PROTEINUR 30 (A) 06/04/2022 2102   NITRITE NEGATIVE 06/04/2022 2102   LEUKOCYTESUR NEGATIVE 06/04/2022 2102   Sepsis Labs: Invalid input(s): "PROCALCITONIN", "LACTICIDVEN"  Microbiology: Recent Results (from the past 240 hour(s))  Respiratory (~20 pathogens) panel by PCR     Status: None   Collection Time: 06/04/22  1:46 AM   Specimen: Nasopharyngeal Swab; Respiratory  Result Value Ref Range Status   Adenovirus NOT DETECTED NOT DETECTED Final   Coronavirus 229E NOT DETECTED NOT DETECTED Final    Comment: (NOTE) The Coronavirus on the Respiratory Panel, DOES NOT test for the novel  Coronavirus (2019 nCoV)    Coronavirus HKU1 NOT DETECTED NOT DETECTED Final   Coronavirus NL63 NOT DETECTED NOT DETECTED Final   Coronavirus OC43 NOT DETECTED NOT DETECTED Final   Metapneumovirus NOT DETECTED NOT DETECTED Final   Rhinovirus / Enterovirus NOT DETECTED NOT DETECTED Final   Influenza A NOT DETECTED NOT DETECTED Final   Influenza B NOT DETECTED NOT DETECTED Final   Parainfluenza  Virus 1 NOT DETECTED NOT DETECTED Final   Parainfluenza Virus 2 NOT DETECTED NOT DETECTED Final   Parainfluenza Virus 3 NOT DETECTED NOT DETECTED Final   Parainfluenza Virus 4 NOT DETECTED  NOT DETECTED Final   Respiratory Syncytial Virus NOT DETECTED NOT DETECTED Final   Bordetella pertussis NOT DETECTED NOT DETECTED Final   Bordetella Parapertussis NOT DETECTED NOT DETECTED Final   Chlamydophila pneumoniae NOT DETECTED NOT DETECTED Final   Mycoplasma pneumoniae NOT DETECTED NOT DETECTED Final    Comment: Performed at [facility], [place] [address] [place], [place] [address]  Urine Culture (for pregnant, neutropenic or urologic patients or patients with an indwelling urinary catheter)     Status: Abnormal (Preliminary result)   Collection Time: 06/05/22  1:36 PM   Specimen: Urine, Clean Catch  Result Value Ref Range Status   Specimen Description URINE, CLEAN CATCH  Final   Special Requests   Final    Normal Performed at [facility], [place] [address], [place], [place] [address]    Culture 60,000 COLONIES/mL ENTEROCOCCUS FAECALIS (A)  Final   Report Status PENDING  Incomplete    Radiology Studies: DG Chest Port 1 View  Result Date: 06/07/2022 CLINICAL DATA:  Chest pain EXAM: PORTABLE CHEST 1 VIEW COMPARISON:  X-ray 06/03/2022.  CT angiogram 06/04/2022 FINDINGS: Enlarged cardiopericardial silhouette. No consolidation, pneumothorax or effusion. Left midlung atelectasis. Overlapping cardiac leads. IMPRESSION: Enlarged heart.  Left midlung atelectasis. Electronically Signed   By: Jill Side M.D.   On: 06/07/2022 16:15      Khole Arterburn [address] [place]  If 7PM-7AM, please contact night-coverage [email or web address] 06/07/2022, 8:44 PM

## 2022-06-07 NOTE — Progress Notes (Signed)
Patient had planned for discharge. Order was put in on last shift but then patient experienced chest and back pain. Was very concerned it could be possible PE and discussed with physicians. Per report, waiting on last troponins to result before deciding discharge. Trops jumped from 16 to now 36. Paging Cardiology to report lab result.

## 2022-06-07 NOTE — Progress Notes (Addendum)
Nutrition Brief Note  Patient identified on the Malnutrition Screening Tool (MST) Report  Wt Readings from Last 15 Encounters:  06/07/22 109.9 kg  06/02/22 110.2 kg  11/14/21 119.2 kg  10/19/21 115.6 kg  10/17/21 116.5 kg  10/12/21 115.2 kg  10/05/20 123.4 kg  12/04/19 107 kg  11/03/18 130 kg  10/24/18 123.1 kg  06/01/17 99.8 kg  08/02/16 117.8 kg  07/12/16 124.7 kg  06/02/15 130.6 kg  09/09/14 121.1 kg   Mr. 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  Met with pt at bedside. He reports his appetite is good and unchanged from baseline. Pt reports he is eating 100% of his meals here. He reports at home typically eating 2 meals per day. For breakfast he may have eggs with grits. For dinner he has sweet potato or rice with salad and beans. He may have peanut butter and graham crackers for snack. He reports he does not eat a lot of meat but eats other foods that contain protein including beans, peanut butter, and yogurt. He reports following heart healthy/low sodium diet at home and does not have any questions regarding this. Denies food allergies or intolerances. Denies nausea, emesis, or abdominal pain. Denies difficulty with chewing or swallowing. Reports taking a multivitamin daily at home. Pt reports he is weight-stable at his dry weight of 242 lbs and denies any unintentional weight loss. Did see some higher weights in chart, but these may be falsely high from fluid status as pt reports he weighs daily and dry wt is 242 lbs. No signs of muscle or fat depletion on nutrition-focused physical exam (see below). Discussed importance of adequate protein intake. Suspect pt is obtaining adequate protein and calories at baseline as evidenced by weight stable at dry weight and no signs of muscle or fat  depletion. Did discuss increased nutrient needs for calories and protein. Pt reports he does not drink oral nutrition supplements at baseline and they are not currently needed as oral intake is good. Denies any nutrition questions or concerns at this time.  Nutrition-Focused Physical Exam Flowsheet Row Most Recent Value  Orbital Region No depletion  Upper Arm Region No depletion  Thoracic and Lumbar Region No depletion  Buccal Region No depletion  Temple Region No depletion  Clavicle Bone Region No depletion  Clavicle and Acromion Bone Region No depletion  Scapular Bone Region No depletion  Dorsal Hand No depletion  Patellar Region No depletion  Anterior Thigh Region No depletion  Posterior Calf Region No depletion  Edema (RD Assessment) Mild  Hair Reviewed  Eyes Reviewed  Mouth Reviewed  Skin Reviewed  Nails Reviewed       Body mass index is 31.11 kg/m. Patient meets criteria for obesity class I based on current BMI.   Current diet order is Heart Healthy, patient is consuming approximately 100% of meals at this time per report. Labs and medications reviewed.   No nutrition interventions warranted at this time. If nutrition issues arise, please consult RD.   Loanne Drilling, MS, RD, LDN, CNSC Pager number available on Amion

## 2022-06-07 NOTE — Discharge Summary (Addendum)
Physician Discharge Summary  Reginald Fernandez ZOX:[id number] DOB: 30-Oct-1980 DOA: 06/03/2022  PCP: Marcine Matar, MD  Admit date: 06/03/2022 Discharge date: 06/14/2022 Admitted From: Home Disposition: Home Recommendations for Outpatient Follow-up:  Follow up with PCP and cardiology as below Assess fluid status, blood pressure, CMP and CBC at follow-up Please follow up on the following pending results: None  Home Health: Not indicated Equipment/Devices: Not indicated  Discharge Condition: Stable CODE STATUS: Full code  Follow-up Information     [facility]. Go in 14 day(s).   Specialty: Cardiology Why: Hospital follow up 06/21/2022 @ 9:30 am PLEASE bring a current medication list to appointment FREE valet parking, Entrance C, off [employer] information: [address] [id number] mc [place] [address] [phone]        Marcine Matar, MD. Schedule an appointment as soon as possible for a visit in 1 week(s).   Specialty: Internal Medicine Contact information: [address] [place] [address] [phone]                 Hospital course 42 year old M with severe BiV failure/ICM, EF < 20%, CAD with multiple MI s/p DES stents, fatty liver disease, obesity and former tobacco smoker return to ED due to ongoing shortness of breath, abdominal pain and feeling poorly.  He was initially seen in ED on 3/16 when he presented with SOB and abdominal pain.  He was diagnosed with HFrEF and started on IV Lasix with improvement in his symptoms and discharged home with increased dose of Lasix.  However, patient continued to feel poorly and return to ED on 3/18.  He had left flank pain, epigastric pain, muscle aches and cramping in addition to chills.  In ED, slightly hypertensive with mild tachycardia.  Normal saturation.  Troponin elevated to 220 but improved to 44.  WBC 11.1.   Creatinine slightly elevated to 1.24 (baseline 0.9).  EKG sinus rhythm and RAD.  CXR with stable cardiomegaly with mild pulmonary congestion.  BNP was elevated to 746.  He was initially admitted for acute on chronic HFrEF and started on IV Lasix.  Cardiology consulted.  TTE with LVEF <20%, global hypokinesis, G2 DD and severely reduced RVSF.  No shunt noted on echocardiogram.  Patient had severe LLQ abdominal pain and epigastric pain with radiation to his back overnight.  As a result, CTA angio chest and CT abdomen and pelvis obtained and showed acute PE with moderate embolic burden without evidence of right heart strain, morphologic change in keeping with PAH, mild cardiomegaly, nonspecific patchy infiltrate with an the posterior RLL and heterogeneous cortical enhancement within bilateral kidneys concerning for pyelonephritis.  LE venous Doppler positive for acute DVT in right popliteal vein, left femoral vein and left posterior tibial vein.  Patient was started on IV heparin.  Also started on IV ceftriaxone for possible pyelonephritis.  Patient had no UTI symptoms.  UA not concerning.  Urine culture ordered and grew Enterococcus faecalis.  Patient has history of penicillin allergies.  After discussion with pharmacy, the decision was made to use Zyvox.  As such, he is discharged on Zyvox 600 mg twice daily for 7 days.  On the day of discharge, respiratory symptoms improved.  He is on room air.  Ambulated on room air and maintained appropriate saturation.  Cleared for discharge by cardiology for outpatient follow-up at heart failure clinic.  Prescriptions filled at [facility] pharmacy.  He has been advised  to hold Jardiance until he completes antibiotic course for possible pyelonephritis.  Patient was to be discharged on 3/21 but developed sudden chest pain after eating his meals.  He was anxious about PE.  EKG with nonspecific T wave changes.  Initial troponin 18.  Repeat Trop trended to 58 but chest pain resolved  after GI cocktail and PPI.  CXR with cardiomegaly and atelectasis.  CMP and lipase within normal.  Patient was chest pain-free overnight and the next.  Felt well and ready to go home.  He is discharged on p.o. Protonix 40 mg daily.  See individual problem list below for more.   Problems addressed during this hospitalization Principal Problem:   Acute pulmonary embolism (HCC) Active Problems:   History of acute inferior wall MI   CAD S/P percutaneous coronary angioplasty: DES PCI -Cx-OM1, RCA x 2   Demand ischemia of myocardium   Elevated troponin   Acute on chronic combined systolic and diastolic CHF (congestive heart failure) (HCC)   Acute pyelonephritis              Time spent 40 minutes  Vital signs Vitals:   06/07/22 1442 06/07/22 2036 06/08/22 0500 06/08/22 0529  BP: 117/85 (!) 115/90  104/74  Pulse:  91  76  Temp:  99.1 F (37.3 C)  97.9 F (36.6 C)  Resp:  20  18  Height:      Weight:   109.8 kg   SpO2:    93%  TempSrc:  Oral  Oral  BMI (Calculated):   31.06      Discharge exam  GENERAL: No apparent distress.  Nontoxic. HEENT: MMM.  Vision and hearing grossly intact.  NECK: Supple.  No apparent JVD.  RESP:  No IWOB.  Fair aeration bilaterally. CVS:  RRR. Heart sounds normal.  ABD/GI/GU: BS+. Abd soft, NTND.  MSK/EXT:  Moves extremities. No apparent deformity. No edema.  SKIN: no apparent skin lesion or wound NEURO: Awake and alert. Oriented appropriately.  No apparent focal neuro deficit. PSYCH: Calm. Normal affect.   Discharge Instructions Discharge Instructions     (HEART FAILURE PATIENTS) Call MD:  Anytime you have any of the following symptoms: 1) 3 pound weight gain in 24 hours or 5 pounds in 1 week 2) shortness of breath, with or without a dry hacking cough 3) swelling in the hands, feet or stomach 4) if you have to sleep on extra pillows at night in order to breathe.   Complete by: As directed    AMB Referral to Advanced Lipid Disorders Clinic    Complete by: As directed    Internal Lipid Clinic Referral Scheduling  Internal lipid clinic referrals are providers within [facility], who wish to refer established patients for routine management (help in starting PCSK9 inhibitor therapy) or advanced therapies.  Internal MD referral criteria:              1. All patients with LDL>190 mg/dL  2. All patients with Triglycerides >500 mg/dL  3. Patients with suspected or confirmed heterozygous familial hyperlipidemia (HeFH) or homozygous familial hyperlipidemia (HoFH)  4. Patients with family history of suspicious for genetic dyslipidemia desiring genetic testing  5. Patients refractory to standard guideline based therapy  6. Patients with statin intolerance (failed 2 statins, one of which must be a high potency statin)  7. Patients who the provider desires to be seen by MD   Internal PharmD referral criteria:   1. Follow-up patients for medication management  2. Follow-up for  compliance monitoring  3. Patients for drug education  4. Patients with statin intolerance  5. PCSK9 inhibitor education and prior authorization approvals  6. Patients with triglycerides <500 mg/dL  External Lipid Clinic Referral  External lipid clinic referrals are for providers outside of [facility], considered new clinic patients - automatically routed to MD schedule   Call MD for:  difficulty breathing, headache or visual disturbances   Complete by: As directed    Call MD for:  extreme fatigue   Complete by: As directed    Call MD for:  persistant dizziness or light-headedness   Complete by: As directed    Call MD for:  severe uncontrolled pain   Complete by: As directed    Diet - low sodium heart healthy   Complete by: As directed    Discharge instructions   Complete by: As directed    It has been a pleasure taking care of you!  You were hospitalized due to pulmonary embolism (blood clot in your lungs), deep venous thrombosis (blood clots in your  legs) and possible left kidney infection for which you have been started on blood thinner and antibiotics.  We are discharging you on more blood thinner and antibiotics.  Please review your new medication list and the directions on your medications before you take them.  We recommend holding your Jardiance until you complete the whole course of antibiotics.  Follow-up with your primary care doctor in 1 to 2 weeks or sooner if needed.  Avoid any over-the-counter pain medication other than plain Tylenol while taking Eliquis.   Take care,   Increase activity slowly   Complete by: As directed       Allergies as of 06/08/2022       Reactions   Penicillins Other (See Comments)   Childhood allergy Unknown reaction Did it involve swelling of the face/tongue/throat, SOB, or low BP? No Did it involve sudden or severe rash/hives, skin peeling, or any reaction on the inside of your mouth or nose? No Did you need to seek medical attention at a hospital or doctor's office? No When did it last happen?      child If all above answers are "NO", may proceed with cephalosporin use.        Medication List     TAKE these medications    acetaminophen 325 MG tablet Commonly known as: TYLENOL Take 2 tablets (650 mg total) by mouth every 6 (six) hours as needed for mild pain (or Fever >/= 101). What changed:  medication strength how much to take when to take this reasons to take this   Aspirin Low Dose 81 MG tablet Generic drug: aspirin EC Take 1 tablet (81 mg total) by mouth daily.   atorvastatin 80 MG tablet Commonly known as: LIPITOR Take 1 tablet (80 mg total) by mouth daily at 6 PM   carvedilol 3.125 MG tablet Commonly known as: COREG Take 3 tablets (9.375 mg total) by mouth 2 (two) times daily with a meal. What changed: medication strength   dapagliflozin propanediol 10 MG Tabs tablet Commonly known as: FARXIGA Take 1 tablet (10 mg total) by mouth daily.   Eliquis DVT/PE  Starter Pack Generic drug: Apixaban Starter Pack (10mg  and 5mg ) Take as directed on package: start with two-5mg  tablets twice daily for 7 days. On day 8, switch to one-5mg  tablet twice daily. Notes to patient: Start with the evening dose on day 3 of the Starter Pack   apixaban 5 MG Tabs  tablet Commonly known as: ELIQUIS Take 1 tablet (5 mg total) by mouth 2 (two) times daily. Start after you finish starter pack Eliquis. Start taking on: July 06, 2022   ezetimibe 10 MG tablet Commonly known as: ZETIA Take 1 tablet (10 mg total) by mouth daily.   furosemide 20 MG tablet Commonly known as: LASIX Take 1 tablet (20 mg total) by mouth daily.   linezolid 600 MG tablet Commonly known as: ZYVOX Take 1 tablet (600 mg total) by mouth every 12 (twelve) hours for 7 days.   nitroGLYCERIN 0.4 MG SL tablet Commonly known as: Nitrostat Place 1 tablet (0.4 mg total) under the tongue every 5 (five) minutes as needed for chest pain.   pantoprazole 40 MG tablet Commonly known as: PROTONIX Take 1 tablet (40 mg total) by mouth daily.   sacubitril-valsartan 49-51 MG Commonly known as: ENTRESTO Take 1 tablet by mouth 2 (two) times daily.   senna-docusate 8.6-50 MG tablet Commonly known as: Senokot-S Take 1 tablet by mouth 2 (two) times daily.   spironolactone 25 MG tablet Commonly known as: ALDACTONE Take 1 tablet (25 mg total) by mouth daily.        Consultations: Cardiology  Procedures/Studies:    [facility] Chest Port 1 View  Result Date: 06/07/2022 CLINICAL DATA:  Chest pain EXAM: PORTABLE CHEST 1 VIEW COMPARISON:  X-ray 06/03/2022.  CT angiogram 06/04/2022 FINDINGS: Enlarged cardiopericardial silhouette. No consolidation, pneumothorax or effusion. Left midlung atelectasis. Overlapping cardiac leads. IMPRESSION: Enlarged heart.  Left midlung atelectasis. Electronically Signed   By: Karen Kays M.D.   On: 06/07/2022 16:15   VAS [place] LOWER EXTREMITY VENOUS (DVT)  Result Date: 06/05/2022   Lower Venous DVT Study Patient Name:  Reginald Fernandez  Date of Exam:   06/05/2022 Medical Rec #: [id number]       Accession #:    [id number] Date of Birth: 07-27-1980       Patient Gender: M Patient Age:   2 years Exam Location:  [facility] Procedure:      VAS [place] LOWER EXTREMITY VENOUS (DVT) Referring Phys: Valente David --------------------------------------------------------------------------------  Indications: Swelling, Pain, and pulmonary embolism.  Anticoagulation: Heparin. Comparison Study: No prior study. Performing Technologist: McKayla Maag RVT, [place]  Examination Guidelines: A complete evaluation includes B-mode imaging, spectral Doppler, color Doppler, and power Doppler as needed of all accessible portions of each vessel. Bilateral testing is considered an integral part of a complete examination. Limited examinations for reoccurring indications may be performed as noted. The reflux portion of the exam is performed with the patient in reverse Trendelenburg.  +---------+---------------+---------+-----------+----------+--------------+ RIGHT    CompressibilityPhasicitySpontaneityPropertiesThrombus Aging +---------+---------------+---------+-----------+----------+--------------+ CFV      Full           Yes      Yes                                 +---------+---------------+---------+-----------+----------+--------------+ SFJ      Full                                                        +---------+---------------+---------+-----------+----------+--------------+ FV Prox  Full                                                        +---------+---------------+---------+-----------+----------+--------------+  FV Mid   Full                                                        +---------+---------------+---------+-----------+----------+--------------+ FV DistalFull                                                         +---------+---------------+---------+-----------+----------+--------------+ PFV      Full                                                        +---------+---------------+---------+-----------+----------+--------------+ POP      None           No       No                   Acute          +---------+---------------+---------+-----------+----------+--------------+ PTV      Full                                                        +---------+---------------+---------+-----------+----------+--------------+ PERO     Full                                                        +---------+---------------+---------+-----------+----------+--------------+   +---------+---------------+---------+-----------+----------+--------------+ LEFT     CompressibilityPhasicitySpontaneityPropertiesThrombus Aging +---------+---------------+---------+-----------+----------+--------------+ CFV      Full           Yes      Yes                                 +---------+---------------+---------+-----------+----------+--------------+ SFJ      Full                                                        +---------+---------------+---------+-----------+----------+--------------+ FV Prox  Full                                                        +---------+---------------+---------+-----------+----------+--------------+ FV Mid   Full                                                        +---------+---------------+---------+-----------+----------+--------------+  FV DistalNone           No       No                   Acute          +---------+---------------+---------+-----------+----------+--------------+ PFV      Full                                                        +---------+---------------+---------+-----------+----------+--------------+ POP      Full           Yes      Yes                                  +---------+---------------+---------+-----------+----------+--------------+ PTV      None           No       No                   Acute          +---------+---------------+---------+-----------+----------+--------------+ PERO     Full                                                        +---------+---------------+---------+-----------+----------+--------------+     Summary: RIGHT: - Findings consistent with acute deep vein thrombosis involving the right popliteal vein. - A cystic structure is found in the popliteal fossa.  LEFT: - Findings consistent with acute deep vein thrombosis involving the left femoral vein, and left posterior tibial veins. - No cystic structure found in the popliteal fossa.  *See table(s) above for measurements and observations. Electronically signed by Waverly Ferrari MD on 06/05/2022 at 4:44:24 PM.    Final    CT ABDOMEN PELVIS W CONTRAST  Result Date: 06/05/2022 CLINICAL DATA:  Hemoptysis; Abdominal pain, acute, nonlocalized EXAM: CT ANGIOGRAPHY CHEST CT ABDOMEN AND PELVIS WITH CONTRAST TECHNIQUE: Multidetector CT imaging of the chest was performed using the standard protocol during bolus administration of intravenous contrast. Multiplanar CT image reconstructions and MIPs were obtained to evaluate the vascular anatomy. Multidetector CT imaging of the abdomen and pelvis was performed using the standard protocol during bolus administration of intravenous contrast. RADIATION DOSE REDUCTION: This exam was performed according to the departmental dose-optimization program which includes automated exposure control, adjustment of the mA and/or kV according to patient size and/or use of iterative reconstruction technique. CONTRAST:  OMNIPAQUE IOHEXOL 350 MG/ML SOLN COMPARISON:  CT abdomen pelvis 06/03/2022 FINDINGS: CTA CHEST FINDINGS Cardiovascular: There is adequate opacification of the pulmonary arterial tree. There are multiple branching intraluminal filling  defects identified within the segmental pulmonary arteries of the a right upper lobe and posterior basal lower lobes bilaterally in keeping with acute pulmonary embolism. The embolic burden is moderate. The central pulmonary arteries are enlarged in keeping with changes of pulmonary arterial hypertension. There is no CT evidence of right heart strain with the RV/LV ratio measuring 0.9. Mild cardiomegaly is present with biventricular enlargement. Stenting of the left circumflex and right coronary arteries has been performed. Moderate multi-vessel coronary artery  calcification. No pericardial effusion. No significant aortic atherosclerotic calcification or aneurysmal dilation. Aberrant origin of the right subclavian artery noted. Mediastinum/Nodes: No enlarged mediastinal, hilar, or axillary lymph nodes. Thyroid gland, trachea, and esophagus demonstrate no significant findings. Lungs/Pleura: Patchy infiltrate within the posterior basal right lower lobe is nonspecific, possibly inflammatory or hemorrhagic in the setting of a small pulmonary infarct. The lungs are otherwise clear. No pneumothorax or pleural effusion. No central obstructing lesion. Musculoskeletal: No acute bone abnormality. No lytic or blastic bone lesion. Review of the MIP images confirms the above findings. CT ABDOMEN and PELVIS FINDINGS Hepatobiliary: No focal liver abnormality is seen. No gallstones, gallbladder wall thickening, or biliary dilatation. Pancreas: Unremarkable Spleen: Unremarkable Adrenals/Urinary Tract: The adrenal glands are unremarkable. The kidneys are normal in size and position. Mild cortical scarring noted bilaterally. There is heterogeneous cortical enhancement again identified within the kidneys bilaterally which may reflect a superimposed inflammatory process such as pyelonephritis. However, the area of more focal hypoenhancement within the lower pole of the left kidney now demonstrates frank non enhancement and is more  compatible with an acute renal infarct. This may represent a infarct related to a paradoxical embolus given the presence of acute pulmonary embolism described above. Contrast staining noted within the infarcted region. No hydronephrosis. No intrarenal or ureteral calculi. The bladder is unremarkable. Stomach/Bowel: Stomach is within normal limits. Appendix appears normal. No evidence of bowel wall thickening, distention, or inflammatory changes. Vascular/Lymphatic: Aortic atherosclerosis. No enlarged abdominal or pelvic lymph nodes. Reproductive: Mild prostatic hypertrophy. Other: No abdominal wall hernia or abnormality. No abdominopelvic ascites. Musculoskeletal: No acute bone abnormality. No lytic or blastic bone lesion. Review of the MIP images confirms the above findings. IMPRESSION: 1. Acute pulmonary embolism with moderate embolic burden. No CT evidence of right heart strain. 2. Morphologic changes in keeping with pulmonary arterial hypertension. 3. Mild cardiomegaly with biventricular enlargement. Moderate multi-vessel coronary artery calcification. 4. Patchy infiltrate within the posterior basal right lower lobe, nonspecific, possibly inflammatory or hemorrhagic in the setting of a small pulmonary infarct. 5. Heterogeneous cortical enhancement within the kidneys bilaterally which may reflect a superimposed inflammatory process such as pyelonephritis. However, the area of more focal hypoenhancement within the lower pole of the left kidney now demonstrates frank non enhancement and is more compatible with an evolving acute renal infarct. This may represent an infarct related to a paradoxical embolus given the presence of acute pulmonary embolism described above and echocardiography may be helpful to assess for the presence of an intracardiac shunt Aortic Atherosclerosis (ICD10-I70.0). Electronically Signed   By: Helyn Numbers M.D.   On: 06/05/2022 01:23   CT Angio Chest Pulmonary Embolism (PE) W or WO  Contrast  Result Date: 06/05/2022 CLINICAL DATA:  Hemoptysis; Abdominal pain, acute, nonlocalized EXAM: CT ANGIOGRAPHY CHEST CT ABDOMEN AND PELVIS WITH CONTRAST TECHNIQUE: Multidetector CT imaging of the chest was performed using the standard protocol during bolus administration of intravenous contrast. Multiplanar CT image reconstructions and MIPs were obtained to evaluate the vascular anatomy. Multidetector CT imaging of the abdomen and pelvis was performed using the standard protocol during bolus administration of intravenous contrast. RADIATION DOSE REDUCTION: This exam was performed according to the departmental dose-optimization program which includes automated exposure control, adjustment of the mA and/or kV according to patient size and/or use of iterative reconstruction technique. CONTRAST:  OMNIPAQUE IOHEXOL 350 MG/ML SOLN COMPARISON:  CT abdomen pelvis 06/03/2022 FINDINGS: CTA CHEST FINDINGS Cardiovascular: There is adequate opacification of the pulmonary arterial tree. There are multiple  branching intraluminal filling defects identified within the segmental pulmonary arteries of the a right upper lobe and posterior basal lower lobes bilaterally in keeping with acute pulmonary embolism. The embolic burden is moderate. The central pulmonary arteries are enlarged in keeping with changes of pulmonary arterial hypertension. There is no CT evidence of right heart strain with the RV/LV ratio measuring 0.9. Mild cardiomegaly is present with biventricular enlargement. Stenting of the left circumflex and right coronary arteries has been performed. Moderate multi-vessel coronary artery calcification. No pericardial effusion. No significant aortic atherosclerotic calcification or aneurysmal dilation. Aberrant origin of the right subclavian artery noted. Mediastinum/Nodes: No enlarged mediastinal, hilar, or axillary lymph nodes. Thyroid gland, trachea, and esophagus demonstrate no significant findings.  Lungs/Pleura: Patchy infiltrate within the posterior basal right lower lobe is nonspecific, possibly inflammatory or hemorrhagic in the setting of a small pulmonary infarct. The lungs are otherwise clear. No pneumothorax or pleural effusion. No central obstructing lesion. Musculoskeletal: No acute bone abnormality. No lytic or blastic bone lesion. Review of the MIP images confirms the above findings. CT ABDOMEN and PELVIS FINDINGS Hepatobiliary: No focal liver abnormality is seen. No gallstones, gallbladder wall thickening, or biliary dilatation. Pancreas: Unremarkable Spleen: Unremarkable Adrenals/Urinary Tract: The adrenal glands are unremarkable. The kidneys are normal in size and position. Mild cortical scarring noted bilaterally. There is heterogeneous cortical enhancement again identified within the kidneys bilaterally which may reflect a superimposed inflammatory process such as pyelonephritis. However, the area of more focal hypoenhancement within the lower pole of the left kidney now demonstrates frank non enhancement and is more compatible with an acute renal infarct. This may represent a infarct related to a paradoxical embolus given the presence of acute pulmonary embolism described above. Contrast staining noted within the infarcted region. No hydronephrosis. No intrarenal or ureteral calculi. The bladder is unremarkable. Stomach/Bowel: Stomach is within normal limits. Appendix appears normal. No evidence of bowel wall thickening, distention, or inflammatory changes. Vascular/Lymphatic: Aortic atherosclerosis. No enlarged abdominal or pelvic lymph nodes. Reproductive: Mild prostatic hypertrophy. Other: No abdominal wall hernia or abnormality. No abdominopelvic ascites. Musculoskeletal: No acute bone abnormality. No lytic or blastic bone lesion. Review of the MIP images confirms the above findings. IMPRESSION: 1. Acute pulmonary embolism with moderate embolic burden. No CT evidence of right heart strain.  2. Morphologic changes in keeping with pulmonary arterial hypertension. 3. Mild cardiomegaly with biventricular enlargement. Moderate multi-vessel coronary artery calcification. 4. Patchy infiltrate within the posterior basal right lower lobe, nonspecific, possibly inflammatory or hemorrhagic in the setting of a small pulmonary infarct. 5. Heterogeneous cortical enhancement within the kidneys bilaterally which may reflect a superimposed inflammatory process such as pyelonephritis. However, the area of more focal hypoenhancement within the lower pole of the left kidney now demonstrates frank non enhancement and is more compatible with an evolving acute renal infarct. This may represent an infarct related to a paradoxical embolus given the presence of acute pulmonary embolism described above and echocardiography may be helpful to assess for the presence of an intracardiac shunt Aortic Atherosclerosis (ICD10-I70.0). Electronically Signed   By: Helyn Numbers M.D.   On: 06/05/2022 01:23   ECHOCARDIOGRAM COMPLETE  Result Date: 06/04/2022    ECHOCARDIOGRAM REPORT   Patient Name:   Reginald Fernandez Date of Exam: 06/04/2022 Medical Rec #:  [id number]      Height:       74.0 in Accession #:    [id number]     Weight:       240.0 lb Date of  Birth:  November 17, 1980      BSA:          2.349 m Patient Age:    41 years       BP:           111/81 mmHg Patient Gender: M              HR:           100 bpm. Exam Location:  Inpatient Procedure: 2D Echo, Cardiac Doppler, Color Doppler and Intracardiac            Opacification Agent Indications:    NSTEMI I21.4  History:        Patient has prior history of Echocardiogram examinations, most                 recent 10/10/2021. Cardiomyopathy, CAD and Previous Myocardial                 Infarction; Risk Factors:Hypertension and Dyslipidemia. S/P                 percutaneous coronary angioplasty 11/20/2013.  Sonographer:    Dondra Prader RVT RCS Referring Phys: [id number] SARA-MAIZ A THOMAS IMPRESSIONS   1. Left ventricular ejection fraction, by estimation, is <20%. The left ventricle has severely decreased function. The left ventricle demonstrates global hypokinesis. The left ventricular internal cavity size was severely dilated. Left ventricular diastolic parameters are consistent with Grade II diastolic dysfunction (pseudonormalization).  2. Right ventricular systolic function is severely reduced. The right ventricular size is moderately enlarged. Tricuspid regurgitation signal is inadequate for assessing PA pressure.  3. Left atrial size was mildly dilated.  4. Right atrial size was mildly dilated.  5. The mitral valve is grossly normal. Trivial mitral valve regurgitation. No evidence of mitral stenosis.  6. The aortic valve is tricuspid. Aortic valve regurgitation is not visualized. No aortic stenosis is present.  7. The inferior vena cava is normal in size with greater than 50% respiratory variability, suggesting right atrial pressure of 3 mmHg. Comparison(s): No significant change from prior study. Conclusion(s)/Recommendation(s): No left ventricular mural or apical thrombus/thrombi. FINDINGS  Left Ventricle: Left ventricular ejection fraction, by estimation, is <20%. The left ventricle has severely decreased function. The left ventricle demonstrates global hypokinesis. Definity contrast agent was given IV to delineate the left ventricular endocardial borders. The left ventricular internal cavity size was severely dilated. There is no left ventricular hypertrophy. Left ventricular diastolic parameters are consistent with Grade II diastolic dysfunction (pseudonormalization). Right Ventricle: The right ventricular size is moderately enlarged. No increase in right ventricular wall thickness. Right ventricular systolic function is severely reduced. Tricuspid regurgitation signal is inadequate for assessing PA pressure. Left Atrium: Left atrial size was mildly dilated. Right Atrium: Right atrial size was mildly  dilated. Pericardium: There is no evidence of pericardial effusion. Mitral Valve: The mitral valve is grossly normal. Trivial mitral valve regurgitation. No evidence of mitral valve stenosis. Tricuspid Valve: The tricuspid valve is grossly normal. Tricuspid valve regurgitation is trivial. No evidence of tricuspid stenosis. Aortic Valve: The aortic valve is tricuspid. Aortic valve regurgitation is not visualized. No aortic stenosis is present. Aortic valve mean gradient measures 2.0 mmHg. Aortic valve peak gradient measures 3.0 mmHg. Aortic valve area, by VTI measures 3.42 cm. Pulmonic Valve: The pulmonic valve was grossly normal. Pulmonic valve regurgitation is trivial. No evidence of pulmonic stenosis. Aorta: The aortic root and ascending aorta are structurally normal, with no evidence of dilitation. Venous: The inferior vena cava  is normal in size with greater than 50% respiratory variability, suggesting right atrial pressure of 3 mmHg. IAS/Shunts: The atrial septum is grossly normal.  LEFT VENTRICLE PLAX 2D LVIDd:         6.90 cm   Diastology LVIDs:         5.80 cm   LV e' medial:    4.60 cm/s LV PW:         1.30 cm   LV E/e' medial:  13.2 LV IVS:        1.10 cm   LV e' lateral:   6.46 cm/s LVOT diam:     2.20 cm   LV E/e' lateral: 9.4 LV SV:         41 LV SV Index:   18 LVOT Area:     3.80 cm  RIGHT VENTRICLE             IVC RV Basal diam:  4.30 cm     IVC diam: 1.20 cm RV Mid diam:    3.40 cm RV S prime:     10.70 cm/s TAPSE (M-mode): 2.1 cm LEFT ATRIUM             Index        RIGHT ATRIUM           Index LA diam:        5.10 cm 2.17 cm/m   RA Area:     24.00 cm LA Vol (A2C):   94.5 ml 40.24 ml/m  RA Volume:   81.70 ml  34.79 ml/m LA Vol (A4C):   86.4 ml 36.79 ml/m LA Biplane Vol: 91.9 ml 39.13 ml/m  AORTIC VALVE                    PULMONIC VALVE AV Area (Vmax):    3.37 cm     PV Vmax:       0.68 m/s AV Area (Vmean):   3.37 cm     PV Peak grad:  1.8 mmHg AV Area (VTI):     3.42 cm AV Vmax:            87.20 cm/s AV Vmean:          67.800 cm/s AV VTI:            0.121 m AV Peak Grad:      3.0 mmHg AV Mean Grad:      2.0 mmHg LVOT Vmax:         77.40 cm/s LVOT Vmean:        60.100 cm/s LVOT VTI:          0.109 m LVOT/AV VTI ratio: 0.90  AORTA Ao Root diam: 3.50 cm Ao Asc diam:  3.50 cm Ao Arch diam: 3.4 cm MITRAL VALVE MV Area (PHT): 6.60 cm    SHUNTS MV Decel Time: 115 msec    Systemic VTI:  0.11 m MV E velocity: 60.80 cm/s  Systemic Diam: 2.20 cm MV A velocity: 43.00 cm/s MV E/A ratio:  1.41 Lennie Odor MD Electronically signed by Lennie Odor MD Signature Date/Time: 06/04/2022/5:22:12 PM    Final    DG Chest 2 View  Result Date: 06/03/2022 CLINICAL DATA:  Chest pain with body aches and fatigue. EXAM: CHEST - 2 VIEW COMPARISON:  June 02, 2022 FINDINGS: The cardiac silhouette is moderately enlarged and unchanged in size. There is mild stable perihilar prominence of the pulmonary vasculature. There is no evidence of an acute infiltrate,  pleural effusion or pneumothorax. The visualized skeletal structures are unremarkable. IMPRESSION: Stable cardiomegaly with findings suggestive of mild pulmonary vascular congestion. Electronically Signed   By: Aram Candela M.D.   On: 06/03/2022 23:36   CT ABDOMEN PELVIS W CONTRAST  Result Date: 06/03/2022 CLINICAL DATA:  Shortness of breath, epigastric abdominal pain. EXAM: CT ABDOMEN AND PELVIS WITH CONTRAST TECHNIQUE: Multidetector CT imaging of the abdomen and pelvis was performed using the standard protocol following bolus administration of intravenous contrast. RADIATION DOSE REDUCTION: This exam was performed according to the departmental dose-optimization program which includes automated exposure control, adjustment of the mA and/or kV according to patient size and/or use of iterative reconstruction technique. CONTRAST:  OMNIPAQUE IOHEXOL 300 MG/ML  SOLN COMPARISON:  10/09/2021. FINDINGS: Lower chest: The heart is enlarged and coronary artery  calcifications are noted. There is a small right pleural effusion. Scattered ground-glass opacities are noted in the lower lobes bilaterally, greater on the right than on the left. Hepatobiliary: No focal abnormality in the liver. No cholelithiasis. There is mild gallbladder wall thickening, unchanged from the previous exam. No biliary ductal dilatation. Pancreas: Unremarkable. No pancreatic ductal dilatation or surrounding inflammatory changes. Spleen: Normal in size without focal abnormality. Adrenals/Urinary Tract: No adrenal nodule or mass. Scattered hypoenhancing regions are noted in the kidneys bilaterally and most pronounced in the lower pole of the left kidney. Perinephric fat stranding is noted on the left. No renal calculus or hydronephrosis. No obstructive uropathy bilaterally. The bladder is unremarkable. Stomach/Bowel: Stomach is within normal limits. Appendix appears normal. No evidence of bowel wall thickening, distention, or inflammatory changes. No free air or pneumatosis. Scattered diverticula are present along the colon without evidence of diverticulitis. Vascular/Lymphatic: Aortic atherosclerosis. No enlarged abdominal or pelvic lymph nodes. Reproductive: Prostate gland is mildly enlarged. Other: A small amount of free fluid is noted in the right pericolic gutter and pelvis. Musculoskeletal: Degenerative changes are present in the thoracolumbar spine. There is sclerosis at the sacroiliac joint on the left, suggesting sacroiliitis. No acute or suspicious osseous abnormality. IMPRESSION: 1. Patchy hypoenhancement of the kidneys bilaterally, most pronounced in the lower pole of the left kidney suggesting pyelonephritis. No renal calculus or obstructive uropathy bilaterally. 2. Mildly enlarged prostate gland. 3. Small right pleural effusion with scattered ground-glass opacities in the lower lobes bilaterally, possible edema or infiltrate. 4. Mild gallbladder wall thickening which is unchanged from  the previous exam. No evidence of cholelithiasis. 5. Small ascites. 6. Aortic atherosclerosis and coronary artery calcifications. Electronically Signed   By: Thornell Sartorius M.D.   On: 06/03/2022 01:20   DG Chest Port 1 View  Result Date: 06/02/2022 CLINICAL DATA:  Shortness of breath. EXAM: PORTABLE CHEST 1 VIEW COMPARISON:  October 09, 2021 FINDINGS: There is stable moderate to marked severity enlargement of the cardiac silhouette. Mild diffusely increased lung markings are noted, slightly more prominent within the bilateral suprahilar and bilateral infrahilar regions. Mild prominence of the perihilar pulmonary vasculature is also seen. There is no evidence of focal consolidation, pleural effusion or pneumothorax. The visualized skeletal structures are unremarkable. IMPRESSION: 1. Stable cardiomegaly with mild pulmonary vascular congestion. Electronically Signed   By: Aram Candela M.D.   On: 06/02/2022 22:19       The results of significant diagnostics from this hospitalization (including imaging, microbiology, ancillary and laboratory) are listed below for reference.     Microbiology: Recent Results (from the past 240 hour(s))  Urine Culture (for pregnant, neutropenic or urologic patients or patients with an  indwelling urinary catheter)     Status: Abnormal   Collection Time: 06/05/22  1:36 PM   Specimen: Urine, Clean Catch  Result Value Ref Range Status   Specimen Description URINE, CLEAN CATCH  Final   Special Requests   Final    Normal Performed at [facility], [address], [place], [place] [address]    Culture 60,000 COLONIES/mL ENTEROCOCCUS FAECALIS (A)  Final   Report Status 06/08/2022 FINAL  Final   Organism ID, Bacteria ENTEROCOCCUS FAECALIS (A)  Final      Susceptibility   Enterococcus faecalis - MIC*    AMPICILLIN <=2 SENSITIVE Sensitive     NITROFURANTOIN <=16 SENSITIVE Sensitive     VANCOMYCIN 1 SENSITIVE Sensitive     * 60,000 COLONIES/mL ENTEROCOCCUS FAECALIS      Labs:  CBC: Recent Labs  Lab 06/08/22 0230  WBC 11.4*  HGB 16.0  HCT 48.1  MCV 91.3  PLT 296   BMP &GFR Recent Labs  Lab 06/07/22 2033 06/08/22 0230  NA 134* 133*  K 4.6 4.4  CL 103 101  CO2 23 23  GLUCOSE 118* 156*  BUN 16 14  CREATININE 1.02 0.93  CALCIUM 8.2* 8.0*  MG  --  2.2  PHOS  --  2.6   Estimated Creatinine Clearance: 137.8 mL/min (by C-G formula based on SCr of 0.93 mg/dL). Liver & Pancreas: Recent Labs  Lab 06/07/22 2033 06/08/22 0230  AST 23  --   ALT 25  --   ALKPHOS 62  --   BILITOT 1.3*  --   PROT 6.7  --   ALBUMIN 3.1* 3.1*   Recent Labs  Lab 06/07/22 2033  LIPASE 31   No results for input(s): "AMMONIA" in the last 168 hours. Diabetic: No results for input(s): "HGBA1C" in the last 72 hours. No results for input(s): "GLUCAP" in the last 168 hours.  Cardiac Enzymes: No results for input(s): "CKTOTAL", "CKMB", "CKMBINDEX", "TROPONINI" in the last 168 hours. No results for input(s): "PROBNP" in the last 8760 hours. Coagulation Profile: No results for input(s): "INR", "PROTIME" in the last 168 hours. Thyroid Function Tests: No results for input(s): "TSH", "T4TOTAL", "FREET4", "T3FREE", "THYROIDAB" in the last 72 hours. Lipid Profile: No results for input(s): "CHOL", "HDL", "LDLCALC", "TRIG", "CHOLHDL", "LDLDIRECT" in the last 72 hours. Anemia Panel: No results for input(s): "VITAMINB12", "FOLATE", "FERRITIN", "TIBC", "IRON", "RETICCTPCT" in the last 72 hours. Urine analysis:    Component Value Date/Time   COLORURINE YELLOW 06/04/2022 2102   APPEARANCEUR CLEAR 06/04/2022 2102   LABSPEC 1.006 06/04/2022 2102   PHURINE 7.0 06/04/2022 2102   GLUCOSEU >=500 (A) 06/04/2022 2102   HGBUR SMALL (A) 06/04/2022 2102   BILIRUBINUR NEGATIVE 06/04/2022 2102   KETONESUR NEGATIVE 06/04/2022 2102   PROTEINUR 30 (A) 06/04/2022 2102   NITRITE NEGATIVE 06/04/2022 2102   LEUKOCYTESUR NEGATIVE 06/04/2022 2102   Sepsis Labs: Invalid input(s):  "PROCALCITONIN", "LACTICIDVEN"   SIGNED:  Almon Hercules, MD  [facility] 06/14/2022, 2:22 PM

## 2022-06-08 ENCOUNTER — Other Ambulatory Visit (HOSPITAL_COMMUNITY): Payer: Self-pay | Admitting: Internal Medicine

## 2022-06-08 ENCOUNTER — Other Ambulatory Visit (HOSPITAL_COMMUNITY): Payer: Self-pay

## 2022-06-08 ENCOUNTER — Telehealth (HOSPITAL_COMMUNITY): Payer: Self-pay | Admitting: Pharmacy Technician

## 2022-06-08 LAB — RENAL FUNCTION PANEL
Albumin: 3.1 g/dL — ABNORMAL LOW (ref 3.5–5.0)
Anion gap: 9 (ref 5–15)
BUN: 14 mg/dL (ref 6–20)
CO2: 23 mmol/L (ref 22–32)
Calcium: 8 mg/dL — ABNORMAL LOW (ref 8.9–10.3)
Chloride: 101 mmol/L (ref 98–111)
Creatinine, Ser: 0.93 mg/dL (ref 0.61–1.24)
GFR, Estimated: >60 mL/min (ref >60)
Glucose, Bld: 156 mg/dL — ABNORMAL HIGH (ref 70–99)
Phosphorus: 2.6 mg/dL (ref 2.5–4.6)
Potassium: 4.4 mmol/L (ref 3.5–5.1)
Sodium: 133 mmol/L — ABNORMAL LOW (ref 135–145)

## 2022-06-08 LAB — URINE CULTURE
Culture: 60000 — AB
Special Requests: NORMAL

## 2022-06-08 LAB — CBC
HCT: 48.1 % (ref 39.0–52.0)
Hemoglobin: 16 g/dL (ref 13.0–17.0)
MCH: 30.4 pg (ref 26.0–34.0)
MCHC: 33.3 g/dL (ref 30.0–36.0)
MCV: 91.3 fL (ref 80.0–100.0)
Platelets: 296 10*3/uL (ref 150–400)
RBC: 5.27 MIL/uL (ref 4.22–5.81)
RDW: 13.2 % (ref 11.5–15.5)
WBC: 11.4 10*3/uL — ABNORMAL HIGH (ref 4.0–10.5)
nRBC: 0 % (ref 0.0–0.2)

## 2022-06-08 LAB — MAGNESIUM: Magnesium: 2.2 mg/dL (ref 1.7–2.4)

## 2022-06-08 MED ORDER — PANTOPRAZOLE SODIUM 40 MG PO TBEC
40.0000 mg | DELAYED_RELEASE_TABLET | Freq: Every day | ORAL | 1 refills | Status: DC
  Filled 2022-06-08: qty 30, 30d supply, fill #0

## 2022-06-08 MED ORDER — PANTOPRAZOLE SODIUM 40 MG PO TBEC
40.0000 mg | DELAYED_RELEASE_TABLET | Freq: Every day | ORAL | 1 refills | Status: AC
  Filled 2022-06-08: qty 30, 30d supply, fill #0

## 2022-06-08 NOTE — Telephone Encounter (Signed)
Patient Advocate Encounter  Prior Authorization for Pantoprazole Sodium 40MG  dr tablets has been approved.    PA# [id number] Insurance [employer] [place] Commercial Electronic Request Form Effective dates: 06/08/2022 through 06/08/2023     Lyndel Safe, [place] Patient Advocate Specialist [place] Patient Advocate Team Direct Number: [phone]  Fax: [phone]

## 2022-06-08 NOTE — Telephone Encounter (Signed)
Patient Advocate Encounter   Received notification that prior authorization for Pantoprazole Sodium 40MG  dr tablets is required.   PA submitted on 06/08/2022 Key [id number] Insurance [facility] [place] Commercial Electronic Request Form Status is pending       Lyndel Safe, [place] Patient Advocate Specialist [place] Patient Advocate Team Direct Number: [phone]  Fax: [phone]

## 2022-06-11 ENCOUNTER — Telehealth: Payer: Self-pay

## 2022-06-11 ENCOUNTER — Other Ambulatory Visit (HOSPITAL_COMMUNITY): Payer: Self-pay

## 2022-06-11 NOTE — Transitions of Care (Post Inpatient/ED Visit) (Signed)
   06/11/2022  Name: Reginald Fernandez MRN: [id number] DOB: 1980/07/26  Today's TOC FU Call Status: Today's TOC FU Call Status:: Successful TOC FU Call Competed TOC FU Call Complete Date: 06/11/22  Transition Care Management Follow-up Telephone Call Date of Discharge: 06/08/22 Discharge Facility: [facility]) Type of Discharge: Inpatient Admission Primary Inpatient Discharge Diagnosis:: acute pulmonary embolism How have you been since you were released from the hospital?: Better Any questions or concerns?: No  Items Reviewed: Did you receive and understand the discharge instructions provided?: Yes Medications obtained and verified?: Yes (Medications Reviewed) (He said he has all medications except entresto and farxiga . He explained he is not to take the [place] while taking an antibiotic and he has an appt with HF pharmacy to set up patient assist. for entresto. he said he did not need to review the med list) Any new allergies since your discharge?: No Dietary orders reviewed?: Yes Type of Diet Ordered:: heart healthy Do you have support at home?: Yes People in Home: other relative(s)  Home Care and Equipment/Supplies: [place] Ordered?: No Any new equipment or medical supplies ordered?: No  Functional Questionnaire: Do you need assistance with bathing/showering or dressing?: No Do you need assistance with meal preparation?: No Do you need assistance with eating?: No Do you have difficulty maintaining continence: No Do you need assistance with getting out of bed/getting out of a chair/moving?: No Do you have difficulty managing or taking your medications?: No  Follow up appointments reviewed: PCP Follow-up appointment confirmed?: Yes Date of PCP follow-up appointment?: 06/27/22 Follow-up Provider: Epimenio Sarin, [facility] Follow-up appointment confirmed?: Yes Date of Specialist follow-up appointment?: 06/21/22 Follow-Up Specialty Provider::  [place] Do you need transportation to your follow-up appointment?: No Do you understand care options if your condition(s) worsen?: Yes-patient verbalized understanding    SIGNATURE Eden Lathe, RN

## 2022-06-11 NOTE — Telephone Encounter (Signed)
From the discharge call:  He stated he is feeling better but his energy level remains low.    He said he has all medications except entresto and farxiga . He explained he is not to take the [place] while taking an antibiotic and he has an appt with HF pharmacy this week to set up patient assistance for entresto. he said he did not need to review the med list.  He had no questions/ concerns.   Follow-up Provider: Epimenio Sarin, PA - 06/27/2022.   [place] appointment - 06/21/2022.

## 2022-06-14 ENCOUNTER — Ambulatory Visit: Payer: Self-pay | Admitting: *Deleted

## 2022-06-14 NOTE — Telephone Encounter (Signed)
Noted  

## 2022-06-14 NOTE — Telephone Encounter (Signed)
  Chief Complaint: vomiting  Symptoms: vomiting , dark in color. Did not state black and denies blood in emesis. Weakness, vomiting x 1 this am and has recent discharge from hospital 06/09/22. Reports abdominal bloating, pain cramps on and off. Drinking water can cause stomach to feel full and bloated. Feels better after vomiting. LBM 06/14/22 loose BM. Frequency: 3 days  Pertinent Negatives: Patient denies shortness of breath now no fever reported.  Disposition: [x] ED /[] Urgent Care (no appt availability in office) / [] Appointment(In office/virtual)/ []  [place] [facility]/ [] Home Care/ [] Refused Recommended Disposition /[] [place] [facility]/ []  Follow-up with PCP Additional Notes:   Recommended ED due to vomiting "dark in color". Unsure of disposition. Patient would like to schedule appt with PCP. Upcoming appt for hospital f/u 06/27/22.    Reason for Disposition  [1] Vomiting AND [2] contains red blood or black ("coffee ground") material  (Exception: Few red streaks in vomit that only happened once.)  Answer Assessment - Initial Assessment Questions 1. VOMITING SEVERITY: "How many times have you vomited in the past 24 hours?"     - MILD:  1 - 2 times/day    - MODERATE: 3 - 5 times/day, decreased oral intake without significant weight loss or symptoms of dehydration    - SEVERE: 6 or more times/day, vomits everything or nearly everything, with significant weight loss, symptoms of dehydration      Yesterday and 1 time today 2. ONSET: "When did the vomiting begin?"      On and off since discharge from hospital 06/09/22 3. FLUIDS: "What fluids or food have you vomited up today?" "Have you been able to keep any fluids down?"     Can keep fluids down but makes stomach feel bloated  4. ABDOMEN PAIN: "Are your having any abdomen pain?" If Yes : "How bad is it and what does it feel like?" (e.g., crampy, dull, intermittent, constant)      Yes comes and goes but more often than not . 5.  DIARRHEA: "Is there any diarrhea?" If Yes, ask: "How many times today?"      Loose BMs  6. CONTACTS: "Is there anyone else in the family with the same symptoms?"      na 7. CAUSE: "What do you think is causing your vomiting?"     Not sure  8. HYDRATION STATUS: "Any signs of dehydration?" (e.g., dry mouth [not only dry lips], too weak to stand) "When did you last urinate?"     Drinking makes feel full.  9. OTHER SYMPTOMS: "Do you have any other symptoms?" (e.g., fever, headache, vertigo, vomiting blood or coffee grounds, recent head injury)     Feels like pulling sensation when up walking , feels weak, abdominal bloating LBM 06/14/22 loose 10. PREGNANCY: "Is there any chance you are pregnant?" "When was your last menstrual period?"       na  Protocols used: Vomiting-A-AH

## 2022-06-20 ENCOUNTER — Telehealth (HOSPITAL_COMMUNITY): Payer: Self-pay

## 2022-06-20 NOTE — Telephone Encounter (Signed)
Called and left patient a voice message  to confirm/remind patient of their appointment at the [facility] on 06/21/22.   A message was also left reminding patient to bring all medications and/or complete list and to give our office a call back if needed.

## 2022-06-20 NOTE — Progress Notes (Signed)
ADVANCED HF CLINIC PROGRESS NOTE   Primary Care: Ladell Pier, MD HF Cardiologist: Dr. Aundra Dubin  HPI: Reginald Fernandez is a 42 y.o. male w/ h/o premature CAD s/p multiple MIs, initial inferior STEMI in 2011, HTN, HLD, tobacco use and poor compliance.    H/o inferior STEMI by ECG in 09/2009 with non-obstructive CAD by cath @ that time.    Admitted 11/2013 w/ CP and inferolateral STE c/w STEMI. Emergent cath showed severe RCA and OM1 disease, both treated w/ PCI + DES. Echo showed normal EF, 55-60%. Lost to f/u.    Admitted 07/2016 w/ ACS. Trop I peaked 0.70. Cath showed two vessel disease with patent stents in the RCA, and OM of the Lcx with normal LVEDP. Unable to identify the culprit for Troponin elevation. He was restarted on plavix post cath. Follow up echo showed drop in EF down to 35-40% with basal anterolateral hypokinesis, and mid to apical akinesis, and G2DD. GDMT adjusted but unfortunately lost to f/u again.    Readmitted 10/2018 for left sided chest pain. Admitted to poor med compliance. Ruled in for NSTEMI. High-sensitivity troponin peaked 9,501. Cath demonstrated the following   Multivessel CAD with mild luminal irregularity of the LAD and evidence for 60% narrowing in a very small first diagonal vessel.  The second diagonal vessel bifurcates in its mid segment and there appears to be total occlusion of the inferior limb after the bifurcation.  (This was not able to be depicted on the diagram); the stent in the circumflex vessel which extends into the OM-1 vessel was widely patent and there is ostial pinching of the AV groove circumflex with narrowing of 50 to 60%; the RCA has luminal irregularity with 30% proximal and proximal to mid stenoses.  The mid RCA stent and distal RCA stent is widely patent.   He was treated medically. Echo showed further reduction in LV systolic function w/ EF 0000000, GIIDD, RV mildly reduced.    Repeat Echo 10/21 showed slightly improved EF, 30-35%, RV  normal.    Unfortunately, was lost to f/u again and had been off meds for > 1 year. Admitted 7/23 with a/c CHF and epigastric abdominal pain. Labs notable for elevated TBili, at 2.9. LFTs normal. Lipase mildly elevated at 53. CT of A/P showed diffuse fatty infiltration of the liver + mild gallbladder wall edema, possibly related to liver disease. No gallstones. Pancrease unremarkable. Diuresed with IV lasix. Echo showed EF < 20%, RV severely reduced. LHC showed diffuse CAD but no severe stenosis to explain worsening of his EF. RHC mildly elevated PCWP w/ pulmonary venous hypertension, normal RA pressure and preserved CO. GDMT titrated. Discharged home, weight 253 lbs. Reestablished care in the [facility].   Recently admitted last month. Initially seen in the ED on 3/16 with shortness of breath and abdominal pain, treated with a dose of diuretics and discharged home on higher dose Lasix. Symptoms worsened when he returned home. Came back to the ED, VSS, troponin up to 220, creatinine 1.2, BNP 745, chest x-ray with cardiomegaly, mild pulmonary vascular congestion.  Inititial CT of chest with small pleural effusion, groundglass opacities in lower lungs concerning for edema, ?pyelonephritis. Admitted, diuresed and placed on abx. Farxiga discontinued. Developed worsening chest and abdominal pain and underwent repeat CT on 3/18 which was + for PE. LE venous dopplers + DVT (Rt popliteal vein). 2D Echo showed severe biventricular dysfunction, LVEF < 20%, RV moderately enlarged and severely reduced systolic function. No visibile thrombus. This was unchanged  from prior study. Placed on heparin and eventually transitioned to Eliquis. Instructed to finish abx for UTI before resuming [place]. Discharged home on 3/21.  He presents to clinic today for post hospital follow-up. Feels better but c/w NYHA class II- early III symptoms. Complains of abdominal distention and bloating. Completed abx but has not yet restarted [place].  Needs new Rx. Also has not been taking Entresto due to cost but he has since obtained insurance. BP 112/98. Denies orthostatic symptoms. EKG shows NSR 99 bpm BAE.  He reports full compliance w/ Eliquis. No abnormal bleeding.   ECG (personally reviewed): NSR 99 bpm   Labs (7/23): K 4.0, creatinine 0.95, LDL 130, HDL, HDL 24 Labs (3/24): K 4.4, creatinine 0.93, Tbili 1.3, AST and ALT normal   Cardiac Studies  - Echo (10/21): EF 30-35%, moderately decreased LV function with global HK, normal RV - Echo (7/23): EF < 20%, severe LV dilation, RV severe dysfunction.  - Echo (3/24): EF < 20%, RV severely reduced   - R/LHC (7/23): diffuse CAD but no severe stenosis to explain worsening of his EF, mildly elevated PCWP w/ pulmonary venous hypertension, normal RA pressure and preserved CO.   RA mean 4 RV 36/3 PA 43/22, mean 31 PCWP mean 16 LV 136/23 AO 135/86  Oxygen saturations: PA 72% AO 93%  Cardiac Output (Fick) 7.57  Cardiac Index (Fick) 3.12 PVR 2 WU     1st Diag lesion is 60% stenosed.   Prox RCA-1 lesion is 55% stenosed.   Mid RCA lesion is 40% stenosed.   Prox RCA-2 lesion is 30% stenosed.   Mid LAD lesion is 40% stenosed.   Prox Cx to Dist Cx lesion is 50% stenosed.   Non-stenotic Prox Cx lesion was previously treated.   Non-stenotic Prox RCA to Mid RCA lesion was previously treated.   Non-stenotic Dist RCA lesion was previously treated.   Non-stenotic 1st Mrg lesion was previously treated   - LHC (8/20): Prox RCA-1 lesion is 30% stenosed. Prox RCA-2 lesion is 30% stenosed. Previously placed Prox RCA to Mid RCA stent (unknown type) is widely patent. Previously placed Dist RCA stent (unknown type) is widely patent. Previously placed 1st Mrg stent (unknown type) is widely patent. Previously placed Prox Cx stent (unknown type) is widely patent. Prox Cx to Mid Cx lesion is 55% stenosed. 1st Diag lesion is 60% stenosed.   Review of Systems: [y] = yes, [ ]  = no   General:  Weight gain [ ] ; Weight loss [ ] ; Anorexia [ ] ; Fatigue [ ] ; Fever [ ] ; Chills [ ] ; Weakness [ ]   Cardiac: Chest pain/pressure [ ] ; Resting SOB [ ] ; Exertional SOB [ Y]; Orthopnea [ ] ; Pedal Edema [ ] ; Palpitations [ ] ; Syncope [ ] ; Presyncope [ ] ; Paroxysmal nocturnal dyspnea[ ]   Pulmonary: Cough [ ] ; Wheezing[ ] ; Hemoptysis[ ] ; Sputum [ ] ; Snoring [ ]   GI: Vomiting[ ] ; Dysphagia[ ] ; Melena[ ] ; Hematochezia [ ] ; Heartburn[ ] ; Abdominal pain [ Y]; Constipation [ ] ; Diarrhea [ ] ; BRBPR [ ]   GU: Hematuria[ ] ; Dysuria [ ] ; Nocturia[ ]   Vascular: Pain in legs with walking [ ] ; Pain in feet with lying flat [ ] ; Non-healing sores [ ] ; Stroke [ ] ; TIA [ ] ; Slurred speech [ ] ;  Neuro: Headaches[ ] ; Vertigo[ ] ; Seizures[ ] ; Paresthesias[ ] ;Blurred vision [ ] ; Diplopia [ ] ; Vision changes [ ]   Ortho/Skin: Arthritis [ ] ; Joint pain [ ] ; Muscle pain [ ] ; Joint swelling [ ] ; Back Pain [ ] ;  Rash [ ]   Psych: Depression[ ] ; Anxiety[ ]   Heme: Bleeding problems [ ] ; Clotting disorders [ ] ; Anemia [ ]   Endocrine: Diabetes [ ] ; Thyroid dysfunction[ ]   Past Medical History:  Diagnosis Date   CAD S/P percutaneous coronary angioplasty 11/20/2013   a. 09/2009: EKG with Inf STEMI - no obstructive CAD;  b. 11/2013 Inflat STEMI/PCI: LM nl, LAD 20p, D1 sm - diff dzs, D2 large - nl, LCX 95-99, OM1 100 (3.5x38 Promus Premier DES), RCA 95-72m, 80d (3.0x20 and 3.0x16 Promus Premier DES') - normal EF;  c. NSTEMI 5/18 - patent stents, otw minimal CAD.- EF by Echo 35-40%; d. NSTEMI 10/2018 - ? culprit - Small branch of  D2,Med Rx,.  EF by Echo ~25%   Cardiomyopathy, ischemic 07/2016   h/o Inf STEMI 11/2013 - EF was "Normal"; b) NSTEMI 07/2016 (NO Cultprit on Cath) - Echo EF 35-40% (Basal Ant-Lat HK, basal-mid Inferolateral & apical Akinesis); c) NSTEMI 10/2018 -felt to be occluded small branch of D2, Echo EF further decreased to 20-25%.    Essential hypertension    Hyperlipidemia with target LDL less than 100    Marijuana abuse     Migraine    Morbid obesity    ST-segment elevation myocardial infarction (STEMI) of inferior wall 11/20/2013   Current Outpatient Medications  Medication Sig Dispense Refill   acetaminophen (TYLENOL) 325 MG tablet Take 2 tablets (650 mg total) by mouth every 6 (six) hours as needed for mild pain (or Fever >/= 101).     [START ON 07/06/2022] apixaban (ELIQUIS) 5 MG TABS tablet Take 1 tablet (5 mg total) by mouth 2 (two) times daily. Start after you finish starter pack Eliquis. 60 tablet 2   aspirin EC 81 MG tablet Take 1 tablet (81 mg total) by mouth daily. 30 tablet 5   atorvastatin (LIPITOR) 80 MG tablet Take 1 tablet (80 mg total) by mouth daily at 6 PM 90 tablet 3   carvedilol (COREG) 3.125 MG tablet Take 3 tablets (9.375 mg total) by mouth 2 (two) times daily with a meal. 180 tablet 2   ezetimibe (ZETIA) 10 MG tablet Take 1 tablet (10 mg total) by mouth daily. 30 tablet 5   furosemide (LASIX) 20 MG tablet Take 1 tablet (20 mg total) by mouth daily. 30 tablet 5   nitroGLYCERIN (NITROSTAT) 0.4 MG SL tablet Place 1 tablet (0.4 mg total) under the tongue every 5 (five) minutes as needed for chest pain. 25 tablet 2   pantoprazole (PROTONIX) 40 MG tablet Take 1 tablet (40 mg total) by mouth daily. (Patient taking differently: Take 40 mg by mouth daily. As needed) 30 tablet 1   senna-docusate (SENOKOT-S) 8.6-50 MG tablet Take 1 tablet by mouth 2 (two) times daily. (Patient taking differently: Take 1 tablet by mouth 2 (two) times daily. As needed)     spironolactone (ALDACTONE) 25 MG tablet Take 1 tablet (25 mg total) by mouth daily. 30 tablet 5   dapagliflozin propanediol (FARXIGA) 10 MG TABS tablet Take 1 tablet (10 mg total) by mouth daily. (Patient not taking: Reported on 06/21/2022) 30 tablet 5   sacubitril-valsartan (ENTRESTO) 49-51 MG Take 1 tablet by mouth 2 (two) times daily. (Patient not taking: Reported on 06/21/2022) 180 tablet 3   No current facility-administered medications for this  encounter.   Allergies  Allergen Reactions   Penicillins Other (See Comments)    Childhood allergy Unknown reaction  Did it involve swelling of the face/tongue/throat, SOB, or low BP? No  Did it involve sudden or severe rash/hives, skin peeling, or any reaction on the inside of your mouth or nose? No Did you need to seek medical attention at a hospital or doctor's office? No When did it last happen?      child If all above answers are "NO", may proceed with cephalosporin use.   Social History   Socioeconomic History   Marital status: Married    Spouse name: Not on file   Number of children: 2   Years of education: Not on file   Highest education level: Not on file  Occupational History    Employer: [employer]    Comment: [place]  Tobacco Use   Smoking status: Former    Types: Cigarettes    Quit date: 09/2016    Years since quitting: 5.7   Smokeless tobacco: Never  Vaping Use   Vaping Use: Every day  Substance and Sexual Activity   Alcohol use: No    Alcohol/week: 0.0 standard drinks of alcohol   Drug use: No   Sexual activity: Yes  Other Topics Concern   Not on file  Social History Narrative   Lives in [place] with wife.  Works with mentally challenged adults.   Social Determinants of Health   Financial Resource Strain: Not on file  Food Insecurity: No Food Insecurity (06/04/2022)   Hunger Vital Sign    Worried About Running Out of Food in the Last Year: Never true    Ran Out of Food in the Last Year: Never true  Transportation Needs: No Transportation Needs (06/04/2022)   PRAPARE - Hydrologist (Medical): No    Lack of Transportation (Non-Medical): No  Physical Activity: Not on file  Stress: Not on file  Social Connections: Not on file  Intimate Partner Violence: Not At Risk (06/04/2022)   Humiliation, Afraid, Rape, and Kick questionnaire    Fear of Current or Ex-Partner: No    Emotionally Abused: No     Physically Abused: No    Sexually Abused: No   Family History  Problem Relation Age of Onset   Hypertension Mother    Heart attack Father 65       2 MIs by age 74 (first at 26)-- CABG   Hypertension Father    Heart failure Father    Hyperlipidemia Father    Heart attack Maternal Grandfather        73's   Heart attack Paternal Grandfather        2's   Heart attack Other    Father w/ premature CAD, first MI at age 53. Paternal grandfather, maternal grandfather and uncle all w/ MIs in their early 53s. No known family history of CHF.   BP (!) 112/98   Pulse (!) 104   Wt 109.7 kg (241 lb 12.8 oz)   SpO2 97%   BMI 31.05 kg/m   Wt Readings from Last 3 Encounters:  06/21/22 109.7 kg (241 lb 12.8 oz)  06/08/22 109.8 kg (242 lb)  06/02/22 110.2 kg (243 lb)   PHYSICAL EXAM: General:  Well appearing. No respiratory difficulty HEENT: normal Neck: supple. JVD 10 cm. Carotids 2+ bilat; no bruits. No lymphadenopathy or thyromegaly appreciated. Cor: PMI nondisplaced. Regular rate & rhythm. No rubs, gallops or murmurs. Lungs: clear Abdomen: soft, nontender, mildly distended. No hepatosplenomegaly. No bruits or masses. Good bowel sounds. Extremities: no cyanosis, clubbing, rash, edema Neuro: alert & oriented x 3, cranial nerves grossly intact. moves all  4 extremities w/o difficulty. Affect pleasant.   ASSESSMENT & PLAN: 1. Chronic systolic CHF: Ischemic cardiomyopathy.  ? Component of NICM d/t HTN +/- PVCs. Last echo in 10/21, EF 30-35%.  EF as low as 20-25% in 8/20.  Echo 7/23 EF < 20%, RV severely reduced. [facility] 7/23 demonstrated diffuse CAD but no severe stenosis to explain worsening of his EF. RHC mildly elevated PCWP w/ pulmonary venous hypertension, normal RA pressure and preserved CO. Most recent eco 3/24 EF < 20%, RV severely reduced (unchanged from prior). NYHA Class II-early III. Mildly fluid overloaded on exam w/ mainly abdominal distention and elevated JVD. No LEE.  - Restart  Entresto, 24-26 mg bid (confirmed w/ pharmacy, qualifies for $10 copay)  - restart Farxiga 10 mg daily. Monitor for GU symptoms  - Continue Spironolactone 25 mg daily  - Continue Coreg  6.25 mg bid. - Continue Lasix 20 mg daily. If c/w refractory volume overload/abdominal distention, can switch to torsemide next visit  - EF not improving w/ GDMT. Will refer to EP for ICD. Narrow QRS so not CRT candidate.  - check CMP and BMP today  2. DVT/PE: Diagnosed 3/24, LE dopplers + Rt popliteal DVT, Chest CT + acute pulmonary embolism with moderate embolic burden. No CT evidence of right heart strain. Echo EF < 20%, RV mod reduced (unchanged from prior) - continue Eliquis, reports full compliance. Denies abnormal bleeding. Check CBC today  3. CAD: Strong family history of premature CAD.  H/o inferolateral STEMI in 9/15 with DES to LCx into OM1 and DES to RCA.  LHC this admit 7/23 demonstrated diffuse CAD but no severe stenosis to explain worsening of his EF. No chest pain. - Continue ASA 81 mg daily. - Continue atorvastatin 80 mg daily + Zetia 10 mg daily. 4. PVCs: EKG today shows NSR, no PVCs  - no has insurance. Will order sleep study  - continue Coreg 6.25 mg bid  5. HTN: Controlled. - Continue GDMT as above. - Check CMP today  6. Hyperlipidemia: Goal LDL < 55. Last LP 3/24 w/ LDL 135 despite Lipitor 80 + Zetia  - Continue statin + Zetia.  - refer to Lipid Clinic  6. Fatty liver disease: Noted on CT abdomen.    F/u w/ APP in 2-3 wks   Lyda Jester, PA-C  06/21/22

## 2022-06-21 ENCOUNTER — Other Ambulatory Visit (HOSPITAL_COMMUNITY): Payer: Self-pay

## 2022-06-21 ENCOUNTER — Encounter (HOSPITAL_COMMUNITY): Payer: Self-pay

## 2022-06-21 ENCOUNTER — Ambulatory Visit (HOSPITAL_COMMUNITY)
Admit: 2022-06-21 | Discharge: 2022-06-21 | Disposition: A | Discharge status: Home / Self Care | Payer: BLUE CROSS/BLUE SHIELD | Attending: Cardiology | Admitting: Cardiology

## 2022-06-21 VITALS — BP 112/98 | HR 104 | Wt 241.8 lb

## 2022-06-21 DIAGNOSIS — Z7982 Long term (current) use of aspirin: Secondary | ICD-10-CM | POA: Insufficient documentation

## 2022-06-21 DIAGNOSIS — R0683 Snoring: Secondary | ICD-10-CM

## 2022-06-21 DIAGNOSIS — Z79899 Other long term (current) drug therapy: Secondary | ICD-10-CM | POA: Insufficient documentation

## 2022-06-21 DIAGNOSIS — I255 Ischemic cardiomyopathy: Secondary | ICD-10-CM | POA: Diagnosis not present

## 2022-06-21 DIAGNOSIS — Z955 Presence of coronary angioplasty implant and graft: Secondary | ICD-10-CM | POA: Insufficient documentation

## 2022-06-21 DIAGNOSIS — Z8249 Family history of ischemic heart disease and other diseases of the circulatory system: Secondary | ICD-10-CM | POA: Insufficient documentation

## 2022-06-21 DIAGNOSIS — I252 Old myocardial infarction: Secondary | ICD-10-CM | POA: Diagnosis not present

## 2022-06-21 DIAGNOSIS — Z7984 Long term (current) use of oral hypoglycemic drugs: Secondary | ICD-10-CM | POA: Diagnosis not present

## 2022-06-21 DIAGNOSIS — Z86711 Personal history of pulmonary embolism: Secondary | ICD-10-CM | POA: Diagnosis not present

## 2022-06-21 DIAGNOSIS — I110 Hypertensive heart disease with heart failure: Secondary | ICD-10-CM | POA: Diagnosis not present

## 2022-06-21 DIAGNOSIS — Z86718 Personal history of other venous thrombosis and embolism: Secondary | ICD-10-CM | POA: Diagnosis not present

## 2022-06-21 DIAGNOSIS — I5022 Chronic systolic (congestive) heart failure: Secondary | ICD-10-CM | POA: Diagnosis not present

## 2022-06-21 DIAGNOSIS — Z7901 Long term (current) use of anticoagulants: Secondary | ICD-10-CM | POA: Insufficient documentation

## 2022-06-21 DIAGNOSIS — I2510 Atherosclerotic heart disease of native coronary artery without angina pectoris: Secondary | ICD-10-CM | POA: Diagnosis not present

## 2022-06-21 DIAGNOSIS — I493 Ventricular premature depolarization: Secondary | ICD-10-CM | POA: Insufficient documentation

## 2022-06-21 DIAGNOSIS — K760 Fatty (change of) liver, not elsewhere classified: Secondary | ICD-10-CM | POA: Diagnosis not present

## 2022-06-21 DIAGNOSIS — E785 Hyperlipidemia, unspecified: Secondary | ICD-10-CM | POA: Diagnosis not present

## 2022-06-21 DIAGNOSIS — R140 Abdominal distension (gaseous): Secondary | ICD-10-CM | POA: Diagnosis not present

## 2022-06-21 LAB — COMPREHENSIVE METABOLIC PANEL
ALT: 49 U/L — ABNORMAL HIGH (ref 0–44)
AST: 34 U/L (ref 15–41)
Albumin: 3.6 g/dL (ref 3.5–5.0)
Alkaline Phosphatase: 76 U/L (ref 38–126)
Anion gap: 8 (ref 5–15)
BUN: 14 mg/dL (ref 6–20)
CO2: 24 mmol/L (ref 22–32)
Calcium: 8.9 mg/dL (ref 8.9–10.3)
Chloride: 104 mmol/L (ref 98–111)
Creatinine, Ser: 1.11 mg/dL (ref 0.61–1.24)
GFR, Estimated: >60 mL/min (ref >60)
Glucose, Bld: 112 mg/dL — ABNORMAL HIGH (ref 70–99)
Potassium: 4.9 mmol/L (ref 3.5–5.1)
Sodium: 136 mmol/L (ref 135–145)
Total Bilirubin: 2.3 mg/dL — ABNORMAL HIGH (ref 0.3–1.2)
Total Protein: 7.3 g/dL (ref 6.5–8.1)

## 2022-06-21 LAB — CBC
HCT: 43.1 % (ref 39.0–52.0)
Hemoglobin: 14.6 g/dL (ref 13.0–17.0)
MCH: 30.7 pg (ref 26.0–34.0)
MCHC: 33.9 g/dL (ref 30.0–36.0)
MCV: 90.7 fL (ref 80.0–100.0)
Platelets: 269 10*3/uL (ref 150–400)
RBC: 4.75 MIL/uL (ref 4.22–5.81)
RDW: 13.4 % (ref 11.5–15.5)
WBC: 9.7 10*3/uL (ref 4.0–10.5)
nRBC: 0 % (ref 0.0–0.2)

## 2022-06-21 LAB — BRAIN NATRIURETIC PEPTIDE: B Natriuretic Peptide: 1188.4 pg/mL — ABNORMAL HIGH (ref 0.0–100.0)

## 2022-06-21 MED ORDER — ENTRESTO 24-26 MG PO TABS
1.0000 | ORAL_TABLET | Freq: Two times a day (BID) | ORAL | 11 refills | Status: DC
  Filled 2022-06-21: qty 60, 30d supply, fill #0
  Filled 2022-07-26: qty 60, 30d supply, fill #1
  Filled 2023-03-18: qty 60, 30d supply, fill #2

## 2022-06-21 MED ORDER — DAPAGLIFLOZIN PROPANEDIOL 10 MG PO TABS: 10.0000 mg | ORAL_TABLET | Freq: Every day | ORAL | 3 refills | Status: DC

## 2022-06-21 NOTE — Progress Notes (Signed)
Patient Name: Reginald Fernandez         DOB: 03/13/1981      Height: 6'2    Weight:241.8lbs  Office Name: [facility]         Referring Provider: Dr. Loralie Champagne  Today's Date:06/21/2022  Date: 06/21/2022   STOP BANG RISK ASSESSMENT S (snore) Have you been told that you snore?     YES   T (tired) Are you often tired, fatigued, or sleepy during the day?   NO  O (obstruction) Do you stop breathing, choke, or gasp during sleep? NO   P (pressure) Do you have or are you being treated for high blood pressure? YES   B (BMI) Is your body index greater than 35 kg/m? NO   A (age) Are you 36 years old or older? NO   N (neck) Do you have a neck circumference greater than 16 inches?   YES/NO   G (gender) Are you a male? YES   TOTAL STOP/BANG "YES" ANSWERS 3                                                                       For Office Use Only              Procedure Order Form    YES to 3+ Stop Bang questions OR two clinical symptoms - patient qualifies for WatchPAT (CPT 95800)             Clinical Notes: Will consult Sleep Specialist and refer for management of therapy due to patient increased risk of Sleep Apnea. Ordering a sleep study due to the following two clinical symptoms: Excessive daytime sleepiness G47.10 / Gastroesophageal reflux K21.9 / Nocturia R35.1 / Morning Headaches G44.221 / Difficulty concentrating R41.840 / Memory problems or poor judgment G31.84 / Personality changes or irritability R45.4 / Loud snoring R06.83 / Depression F32.9 / Unrefreshed by sleep G47.8 / Impotence N52.9 / History of high blood pressure R03.0 / Insomnia G47.00    I understand that I am proceeding with a home sleep apnea test as ordered by my treating physician. I understand that untreated sleep apnea is a serious cardiovascular risk factor and it is my responsibility to perform the test and seek management for sleep apnea. I will be contacted with the results and be managed  for sleep apnea by a local sleep physician. I will be receiving equipment and further instructions from [facility]. I shall promptly ship back the equipment via the included mailing label. I understand my insurance will be billed for the test and as the patient I am responsible for any insurance related out-of-pocket costs incurred. I have been provided with written instructions and can call for additional video or telephonic instruction, with 24-hour availability of qualified personnel to answer any questions: Patient Help Desk [phone].  Patient Signature ______________________________________________________   Date______________________ Patient Telemedicine Verbal Consent

## 2022-06-21 NOTE — Patient Instructions (Addendum)
EKG done today.  Labs done today. We will contact you only if your labs are abnormal.  RESTART Wilder Glade 10mg  (1 tablet) by mouth daily.   START Entresto 24-26mg  (1 tablet) by mouth 2 times daily.   No other medication changes were made. Please continue all current medications as prescribed.  You have been referred to Electrophysiology and the Lipid Clinic. They will contact you to schedule an appointment.  Your provider has recommended that you have a home sleep study ([facility] Test).  We have provided you with the equipment in our office today. Please go ahead and download the app. DO NOT OPEN OR TAMPER WITH THE BOX UNTIL WE ADVISE YOU TO DO SO. Once insurance has approved the test our office will call you with PIN number and approval to proceed with testing. Once you have completed the test you just dispose of the equipment, the information is automatically uploaded to [place] via blue-tooth technology. If your test is positive for sleep apnea and you need a home CPAP machine you will be contacted by Dr Theodosia Blender office [facility]) to set this up.  Your physician recommends that you schedule a follow-up appointment in: 2-3 weeks with our NP/PA Clinic here in our office.   If you have any questions or concerns before your next appointment please send [place] a message through [place] or call our office at [phone].    TO LEAVE A MESSAGE FOR THE NURSE SELECT OPTION 2, PLEASE LEAVE A MESSAGE INCLUDING: YOUR NAME DATE OF BIRTH CALL BACK NUMBER REASON FOR CALL**this is important as we prioritize the call backs  YOU WILL RECEIVE A CALL BACK THE SAME DAY AS LONG AS YOU CALL BEFORE 4:00 PM   Do the following things EVERYDAY: Weigh yourself in the morning before breakfast. Write it down and keep it in a log. Take your medicines as prescribed Eat low salt foods--Limit salt (sodium) to 2000 mg per day.  Stay as active as you can everyday Limit all fluids for the day to less than 2 liters   At  the [facility], you and your health needs are our priority. As part of our continuing mission to provide you with exceptional heart care, we have created designated Provider Care Teams. These Care Teams include your primary Cardiologist (physician) and Advanced Practice Providers (APPs- Physician Assistants and Nurse Practitioners) who all work together to provide you with the care you need, when you need it.   You may see any of the following providers on your designated Care Team at your next follow up: Dr Glori Bickers Dr Haynes Kerns, NP Lyda Jester, [place] Audry Riles, PharmD   Please be sure to bring in all your medications bottles to every appointment.

## 2022-06-27 ENCOUNTER — Other Ambulatory Visit (HOSPITAL_COMMUNITY): Payer: Self-pay

## 2022-06-27 ENCOUNTER — Ambulatory Visit: Payer: BLUE CROSS/BLUE SHIELD | Attending: Physician Assistant | Admitting: Physician Assistant

## 2022-06-27 ENCOUNTER — Encounter: Payer: Self-pay | Admitting: Physician Assistant

## 2022-06-27 VITALS — BP 114/84 | HR 95 | Wt 249.2 lb

## 2022-06-27 DIAGNOSIS — Z09 Encounter for follow-up examination after completed treatment for conditions other than malignant neoplasm: Secondary | ICD-10-CM

## 2022-06-27 DIAGNOSIS — I5022 Chronic systolic (congestive) heart failure: Secondary | ICD-10-CM | POA: Diagnosis not present

## 2022-06-27 DIAGNOSIS — R7303 Prediabetes: Secondary | ICD-10-CM

## 2022-06-27 DIAGNOSIS — K760 Fatty (change of) liver, not elsewhere classified: Secondary | ICD-10-CM | POA: Diagnosis not present

## 2022-06-27 DIAGNOSIS — I2699 Other pulmonary embolism without acute cor pulmonale: Secondary | ICD-10-CM

## 2022-06-27 MED ORDER — DAPAGLIFLOZIN PROPANEDIOL 10 MG PO TABS
10.0000 mg | ORAL_TABLET | Freq: Every day | ORAL | 3 refills | Status: DC
  Filled 2022-06-27: qty 90, 90d supply, fill #0
  Filled 2023-03-18: qty 90, 90d supply, fill #1

## 2022-06-27 NOTE — Progress Notes (Signed)
Patient ID: Reginald Fernandez, male   DOB: 1980-11-25, 42 y.o.   MRN: [id number]   Reginald Fernandez, is a 42 y.o. male  NWG:[id number]  [id number]:[id number]  DOB - 12-02-1980  Chief Complaint  Patient presents with   Hospitalization Follow-up       Subjective:   Reginald Fernandez is a 42 y.o. male here today for a follow up visit After hospitalization 3/17-3/28 with acute PE.  BNP 1188 on 4/4 cardiology f/up.  He is taking the lasix.  Says weight at home is hovering bt 243 and 245.  Today weight is 249.  His appetite is good.  Needs Rx Reginald Fernandez.  Denies SOB, dizziness, or increased leg swelling.  Has all of his other meds.  No urinary s/sx.  Bowels moving normally.     From Discharge summary: Problems addressed during this hospitalization Principal Problem:   Acute pulmonary embolism (HCC) Active Problems:   History of acute inferior wall MI   CAD S/P percutaneous coronary angioplasty: DES PCI -Cx-OM1, RCA x 2   Demand ischemia of myocardium   Elevated troponin   Acute on chronic combined systolic and diastolic CHF (congestive heart failure) (HCC)   Acute pyelonephritis   Hospital course 42 year old M with severe BiV failure/ICM, EF < 20%, CAD with multiple MI s/p DES stents, fatty liver disease, obesity and former tobacco smoker return to ED due to ongoing shortness of breath, abdominal pain and feeling poorly.  He was initially seen in ED on 3/16 when he presented with SOB and abdominal pain.  He was diagnosed with HFrEF and started on IV Lasix with improvement in his symptoms and discharged home with increased dose of Lasix.  However, patient continued to feel poorly and return to ED on 3/18.  He had left flank pain, epigastric pain, muscle aches and cramping in addition to chills.  In ED, slightly hypertensive with mild tachycardia.  Normal saturation.  Troponin elevated to 220 but improved to 44.  WBC 11.1.  Creatinine slightly elevated to 1.24 (baseline 0.9).  EKG sinus rhythm and RAD.  CXR with  stable cardiomegaly with mild pulmonary congestion.  BNP was elevated to 746.  He was initially admitted for acute on chronic HFrEF and started on IV Lasix.  Cardiology consulted.   TTE with LVEF <20%, global hypokinesis, G2 DD and severely reduced RVSF.  No shunt noted on echocardiogram.  Patient had severe LLQ abdominal pain and epigastric pain with radiation to his back overnight.  As a result, CTA angio chest and CT abdomen and pelvis obtained and showed acute PE with moderate embolic burden without evidence of right heart strain, morphologic change in keeping with PAH, mild cardiomegaly, nonspecific patchy infiltrate with an the posterior RLL and heterogeneous cortical enhancement within bilateral kidneys concerning for pyelonephritis.  LE venous Doppler positive for acute DVT in right popliteal vein, left femoral vein and left posterior tibial vein.  Patient was started on IV heparin.  Also started on IV ceftriaxone for possible pyelonephritis.  Patient had no UTI symptoms.  UA not concerning.  Urine culture ordered and grew Enterococcus faecalis.  Patient has history of penicillin allergies.  After discussion with pharmacy, the decision was made to use Zyvox.  As such, he is discharged on Zyvox 600 mg twice daily for 7 days.   On the day of discharge, respiratory symptoms improved.  He is on room air.  Ambulated on room air and maintained appropriate saturation.  Cleared for discharge by cardiology for outpatient follow-up at  heart failure clinic.  Prescriptions filled at [facility] pharmacy.  He has been advised to hold Jardiance until he completes antibiotic course for possible pyelonephritis.   Patient was to be discharged on 3/21 but developed sudden chest pain after eating his meals.  He was anxious about PE.  EKG with nonspecific T wave changes.  Initial troponin 18.  Repeat Trop trended to 58 but chest pain resolved after GI cocktail and PPI.  CXR with cardiomegaly and atelectasis.  CMP and lipase  within normal.  Patient was chest pain-free overnight and the next.  Felt well and ready to go home.  He is discharged on p.o. Protonix 40 mg daily   From cardiology A/P 06/21/2022: ASSESSMENT & PLAN: 1. Chronic systolic CHF: Ischemic cardiomyopathy.  ? Component of NICM d/t HTN +/- PVCs. Last echo in 10/21, EF 30-35%.  EF as low as 20-25% in 8/20.  Echo 7/23 EF < 20%, RV severely reduced. [facility] 7/23 demonstrated diffuse CAD but no severe stenosis to explain worsening of his EF. RHC mildly elevated PCWP w/ pulmonary venous hypertension, normal RA pressure and preserved CO. Most recent eco 3/24 EF < 20%, RV severely reduced (unchanged from prior). NYHA Class II-early III. Mildly fluid overloaded on exam w/ mainly abdominal distention and elevated JVD. No LEE.  - Restart Entresto, 24-26 mg bid (confirmed w/ pharmacy, qualifies for $10 copay)  - restart Farxiga 10 mg daily. Monitor for GU symptoms  - Continue Spironolactone 25 mg daily  - Continue Coreg  6.25 mg bid. - Continue Lasix 20 mg daily. If c/w refractory volume overload/abdominal distention, can switch to torsemide next visit  - EF not improving w/ GDMT. Will refer to EP for ICD. Narrow QRS so not CRT candidate.  - check CMP and BMP today  2. DVT/PE: Diagnosed 3/24, LE dopplers + Rt popliteal DVT, Chest CT + acute pulmonary embolism with moderate embolic burden. No CT evidence of right heart strain. Echo EF < 20%, RV mod reduced (unchanged from prior) - continue Eliquis, reports full compliance. Denies abnormal bleeding. Check CBC today  3. CAD: Strong family history of premature CAD.  H/o inferolateral STEMI in 9/15 with DES to LCx into OM1 and DES to RCA.  [facility] this admit 7/23 demonstrated diffuse CAD but no severe stenosis to explain worsening of his EF. No chest pain. - Continue ASA 81 mg daily. - Continue atorvastatin 80 mg daily + Zetia 10 mg daily. 4. PVCs: EKG today shows NSR, no PVCs  - no has insurance. Will order sleep study  -  continue Coreg 6.25 mg bid  5. HTN: Controlled. - Continue GDMT as above. - Check CMP today  6. Hyperlipidemia: Goal LDL < 55. Last LP 3/24 w/ LDL 135 despite Lipitor 80 + Zetia  - Continue statin + Zetia.  - refer to [facility]  6. Fatty liver disease: Noted on CT abdomen.  No problems updated.  ALLERGIES: Allergies  Allergen Reactions   Penicillins Other (See Comments)    Childhood allergy Unknown reaction  Did it involve swelling of the face/tongue/throat, SOB, or low BP? No Did it involve sudden or severe rash/hives, skin peeling, or any reaction on the inside of your mouth or nose? No Did you need to seek medical attention at a hospital or doctor's office? No When did it last happen?      child If all above answers are "NO", may proceed with cephalosporin use.    PAST MEDICAL HISTORY: Past Medical History:  Diagnosis  Date   CAD S/P percutaneous coronary angioplasty 11/20/2013   a. 09/2009: EKG with Inf STEMI - no obstructive CAD;  b. 11/2013 Inflat STEMI/PCI: LM nl, LAD 20p, D1 sm - diff dzs, D2 large - nl, LCX 95-99, OM1 100 (3.5x38 Promus Premier DES), RCA 95-575m, 80d (3.0x20 and 3.0x16 Promus Premier DES') - normal EF;  c. NSTEMI 5/18 - patent stents, otw minimal CAD.- EF by Echo 35-40%; d. NSTEMI 10/2018 - ? culprit - Small branch of  D2,Med Rx,.  EF by Echo ~25%   Cardiomyopathy, ischemic 07/2016   h/o Inf STEMI 11/2013 - EF was "Normal"; b) NSTEMI 07/2016 (NO Cultprit on Cath) - Echo EF 35-40% (Basal Ant-Lat HK, basal-mid Inferolateral & apical Akinesis); c) NSTEMI 10/2018 -felt to be occluded small branch of D2, Echo EF further decreased to 20-25%.    Essential hypertension    Hyperlipidemia with target LDL less than 100    Marijuana abuse    Migraine    Morbid obesity    ST-segment elevation myocardial infarction (STEMI) of inferior wall 11/20/2013    MEDICATIONS AT HOME: Prior to Admission medications   Medication Sig Start Date End Date Taking? Authorizing Provider   acetaminophen (TYLENOL) 325 MG tablet Take 2 tablets (650 mg total) by mouth every 6 (six) hours as needed for mild pain (or Fever >/= 101). 06/07/22  Yes Gonfa, Boyce Mediciaye T, MD  apixaban (ELIQUIS) 5 MG TABS tablet Take 1 tablet (5 mg total) by mouth 2 (two) times daily. Start after you finish starter pack Eliquis. 07/06/22  Yes Almon HerculesGonfa, Taye T, MD  aspirin EC 81 MG tablet Take 1 tablet (81 mg total) by mouth daily. 10/12/21  Yes Robbie LisSimmons, Brittainy M, PA-C  atorvastatin (LIPITOR) 80 MG tablet Take 1 tablet (80 mg total) by mouth daily at 6 PM 10/17/21  Yes Milford, Anderson MaltaJessica M, FNP  carvedilol (COREG) 3.125 MG tablet Take 3 tablets (9.375 mg total) by mouth 2 (two) times daily with a meal. 06/07/22  Yes Gonfa, Taye T, MD  ezetimibe (ZETIA) 10 MG tablet Take 1 tablet (10 mg total) by mouth daily. 10/17/21  Yes Milford, Anderson MaltaJessica M, FNP  furosemide (LASIX) 20 MG tablet Take 1 tablet (20 mg total) by mouth daily. 10/17/21  Yes Milford, Anderson MaltaJessica M, FNP  nitroGLYCERIN (NITROSTAT) 0.4 MG SL tablet Place 1 tablet (0.4 mg total) under the tongue every 5 (five) minutes as needed for chest pain. 10/12/21  Yes Simmons, Brittainy M, PA-C  sacubitril-valsartan (ENTRESTO) 24-26 MG Take 1 tablet by mouth 2 (two) times daily. 06/21/22  Yes Simmons, Brittainy M, PA-C  senna-docusate (SENOKOT-S) 8.6-50 MG tablet Take 1 tablet by mouth 2 (two) times daily. Patient taking differently: Take 1 tablet by mouth 2 (two) times daily. As needed 06/07/22  Yes Almon HerculesGonfa, Taye T, MD  spironolactone (ALDACTONE) 25 MG tablet Take 1 tablet (25 mg total) by mouth daily. 10/17/21  Yes Milford, Anderson MaltaJessica M, FNP  dapagliflozin propanediol (FARXIGA) 10 MG TABS tablet Take 1 tablet (10 mg total) by mouth daily. 06/27/22   Anders SimmondsMcClung, Daveah Varone M, PA-C  pantoprazole (PROTONIX) 40 MG tablet Take 1 tablet (40 mg total) by mouth daily. Patient not taking: Reported on 06/27/2022 06/08/22   Almon HerculesGonfa, Taye T, MD    ROS: Neg HEENT Neg resp Neg cardiac Neg GI Neg GU Neg MS Neg  psych Neg neuro  Objective:   Vitals:   06/27/22 1000  BP: 114/84  Pulse: 95  SpO2: 98%  Weight: 249 lb 3.2 oz (113  kg)   Exam General appearance : Awake, alert, not in any distress. Speech Clear. Not toxic looking HEENT: Atraumatic and Normocephalic Neck: Supple, no JVD. No cervical lymphadenopathy.  Chest: Good air entry bilaterally, CTAB.  No rales/rhonchi/wheezing CVS: S1 S2 regular, no murmurs.  Extremities: B/L Lower Ext shows mild edema on R and none on L. both legs are warm to touch Neurology: Awake alert, and oriented X 3, CN II-XII intact, Non focal Skin: No Rash  Data Review Lab Results  Component Value Date   HGBA1C 5.9 (H) 10/10/2021   HGBA1C 5.9 (H) 10/24/2018   HGBA1C 6.0 (H) 11/22/2013    Assessment & Plan   1. Acute pulmonary embolism, unspecified pulmonary embolism type, unspecified whether acute cor pulmonale present Doing well-on Eliquis  2. Chronic systolic heart failure Followed by cardiology-continue current regimen - dapagliflozin propanediol (FARXIGA) 10 MG TABS tablet; Take 1 tablet (10 mg total) by mouth daily.  Dispense: 90 tablet; Refill: 3 He is weighing daily and knows to call if 3 pound or more weight gain to call cardiology  3. Prediabetes I have had a lengthy discussion and provided education about insulin resistance and the intake of too much sugar/refined carbohydrates.  I have advised the patient to work at a goal of eliminating sugary drinks, candy, desserts, sweets, refined sugars, processed foods, and white carbohydrates.  The patient expresses understanding.  - dapagliflozin propanediol (FARXIGA) 10 MG TABS tablet; Take 1 tablet (10 mg total) by mouth daily.  Dispense: 90 tablet; Refill: 3  4. Fatty liver Low fat/low sugar diet  5. Hospital discharge follow-up Doing well    Return in about 3 months (around 09/26/2022) for PCP for chronic conditions.  The patient was given clear instructions to go to ER or return to medical  center if symptoms don't improve, worsen or new problems develop. The patient verbalized understanding. The patient was told to call to get lab results if they haven't heard anything in the next week.      Georgian Co, PA-C [facility] and [address] [facility] [place], [place] [phone]   06/27/2022, 10:24 AM

## 2022-06-27 NOTE — Patient Instructions (Signed)
Heart Failure Action Plan A heart failure action plan helps you understand what to do when you have symptoms of heart failure. Your action plan is a color-coded plan that lists the symptoms to watch for and indicates what actions to take. If you have symptoms in the red zone, you need medical care right away. If you have symptoms in the yellow zone, you are having problems. If you have symptoms in the green zone, you are doing well. Follow the plan that was created by you and your health care provider. Review your plan each time you visit your health care provider. Red zone These signs and symptoms mean you should get medical help right away: You have trouble breathing when resting. You have a dry cough that is getting worse. You have swelling or pain in your legs or abdomen that is getting worse. You suddenly gain more than 2-3 lb (0.9-1.4 kg) in 24 hours, or more than 5 lb (2.3 kg) in a week. This amount may be more or less depending on your condition. You have trouble staying awake or you feel confused. You have chest pain. You do not have an appetite. You pass out. You have worsening sadness or depression. If you have any of these symptoms, call your local emergency services (911 in the [place] right away. Do not drive yourself to the hospital. Yellow zone These signs and symptoms mean your condition may be getting worse and you should make some changes: You have trouble breathing when you are active, or you need to sleep with your head raised on extra pillows to help you breathe. You have swelling in your legs or abdomen. You gain 2-3 lb (0.9-1.4 kg) in 24 hours, or 5 lb (2.3 kg) in a week. This amount may be more or less depending on your condition. You get tired easily. You have trouble sleeping. You have a dry cough. If you have any of these symptoms: Contact your health care provider within the next day. Your health care provider may adjust your medicines. Green zone These signs  mean you are doing well and can continue what you are doing: You do not have shortness of breath. You have very little swelling or no new swelling. Your weight is stable (no gain or loss). You have a normal activity level. You do not have chest pain or any other new symptoms. Follow these instructions at home: Take over-the-counter and prescription medicines only as told by your health care provider. Weigh yourself daily. Your target weight is __________ lb (__________ kg). Call your health care provider if you gain more than __________ lb (__________ kg) in 24 hours, or more than __________ lb (__________ kg) in a week. Health care provider name: _____________________________________________________ Health care provider phone number: _____________________________________________________ Eat a heart-healthy diet. Work with a diet and nutrition specialist (dietitian) to create an eating plan that is best for you. Keep all follow-up visits. This is important. Where to find more information American Heart Association: Summary A heart failure action plan helps you understand what to do when you have symptoms of heart failure. Follow the action plan that was created by you and your health care provider. Get help right away if you have any symptoms in the red zone. This information is not intended to replace advice given to you by your health care provider. Make sure you discuss any questions you have with your health care provider. Document Revised: 06/13/2021 Document Reviewed: 10/19/2019 Elsevier Patient Education  2023 Elsevier Inc.  

## 2022-07-05 ENCOUNTER — Encounter: Payer: Self-pay | Admitting: Cardiology

## 2022-07-05 ENCOUNTER — Telehealth (HOSPITAL_COMMUNITY): Payer: Self-pay

## 2022-07-05 NOTE — Telephone Encounter (Signed)
Called and left patient a voice message  to confirm/remind patient of their appointment at [facility] on 07/06/22.

## 2022-07-06 ENCOUNTER — Encounter (HOSPITAL_COMMUNITY): Payer: BLUE CROSS/BLUE SHIELD

## 2022-07-10 ENCOUNTER — Other Ambulatory Visit (HOSPITAL_COMMUNITY): Payer: Self-pay

## 2022-07-11 ENCOUNTER — Other Ambulatory Visit: Payer: Self-pay

## 2022-07-11 ENCOUNTER — Ambulatory Visit
Admission: EM | Admit: 2022-07-11 | Discharge: 2022-07-11 | Disposition: A | Discharge status: Home / Self Care | Payer: BLUE CROSS/BLUE SHIELD | Attending: Physician Assistant | Admitting: Physician Assistant

## 2022-07-11 ENCOUNTER — Telehealth: Payer: Self-pay | Admitting: Physician Assistant

## 2022-07-11 ENCOUNTER — Other Ambulatory Visit (HOSPITAL_COMMUNITY): Payer: Self-pay

## 2022-07-11 DIAGNOSIS — K641 Second degree hemorrhoids: Secondary | ICD-10-CM | POA: Diagnosis not present

## 2022-07-11 DIAGNOSIS — K6289 Other specified diseases of anus and rectum: Secondary | ICD-10-CM

## 2022-07-11 MED ORDER — HYDROCORT-PRAMOXINE (PERIANAL) 1-1 % EX FOAM
1.0000 | Freq: Four times a day (QID) | CUTANEOUS | 2 refills | Status: DC
  Filled 2022-07-11 (×2): qty 10, 10d supply, fill #0

## 2022-07-11 MED ORDER — TRAMADOL HCL 50 MG PO TABS
50.0000 mg | ORAL_TABLET | Freq: Four times a day (QID) | ORAL | 0 refills | Status: AC | PRN
  Filled 2022-07-11: qty 15, 4d supply, fill #0

## 2022-07-11 MED ORDER — DOCUSATE SODIUM 100 MG PO CAPS
100.0000 mg | ORAL_CAPSULE | Freq: Two times a day (BID) | ORAL | 0 refills | Status: AC
  Filled 2022-07-11: qty 60, 30d supply, fill #0

## 2022-07-11 MED ORDER — PROCORT 1.85-1.15 % EX CREA
TOPICAL_CREAM | CUTANEOUS | 1 refills | Status: DC
  Filled 2022-07-11: qty 60, 10d supply, fill #0

## 2022-07-11 NOTE — ED Provider Notes (Signed)
[facility]    CSN: [id number] Arrival date & time: 07/11/22  1043      History   Chief Complaint Chief Complaint  Patient presents with   Abscess    HPI Reginald Fernandez is a 42 y.o. male.   42 year old male presents with rectal pain.  Patient indicates 4 days ago he had to push his truck out of the highway and out of traffic.  He indicates shortly after he started having rectal pain and discomfort which he relates is gotten worse over the past 3 to 4 days.  He indicates that he is having extreme rectal pain, noting that it feels like a hemorrhoid but he indicates that it is enlarged significantly since it originally started.  He indicates he has had to strain with bowel movements, he has not seen any with bowel movements.  He Isabella Stalling that he has taken Tylenol and ibuprofen OTC without relief of his pain.  He is without fever or chills.  He is concerned due to the degree of pain he is having and also indicating the size of the hemorrhoid has increased.   Abscess   Past Medical History:  Diagnosis Date   CAD S/P percutaneous coronary angioplasty 11/20/2013   a. 09/2009: EKG with Inf STEMI - no obstructive CAD;  b. 11/2013 Inflat STEMI/PCI: LM nl, LAD 20p, D1 sm - diff dzs, D2 large - nl, LCX 95-99, OM1 100 (3.5x38 Promus Premier DES), RCA 95-23m, 80d (3.0x20 and 3.0x16 Promus Premier DES') - normal EF;  c. NSTEMI 5/18 - patent stents, otw minimal CAD.- EF by Echo 35-40%; d. NSTEMI 10/2018 - ? culprit - Small branch of  D2,Med Rx,.  EF by Echo ~25%   Cardiomyopathy, ischemic 07/2016   h/o Inf STEMI 11/2013 - EF was "Normal"; b) NSTEMI 07/2016 (NO Cultprit on Cath) - Echo EF 35-40% (Basal Ant-Lat HK, basal-mid Inferolateral & apical Akinesis); c) NSTEMI 10/2018 -felt to be occluded small branch of D2, Echo EF further decreased to 20-25%.    Essential hypertension    Hyperlipidemia with target LDL less than 100    Marijuana abuse    Migraine    Morbid obesity    ST-segment  elevation myocardial infarction (STEMI) of inferior wall 11/20/2013    Patient Active Problem List   Diagnosis Date Noted   Acute pyelonephritis 06/07/2022   Acute pulmonary embolism 06/06/2022   Elevated troponin 06/04/2022   Acute on chronic combined systolic and diastolic CHF (congestive heart failure) 06/04/2022   CHF (congestive heart failure) 10/09/2021   Educated about COVID-19 virus infection 12/06/2019   Prediabetes 10/25/2018   Dilated cardiomyopathy 08/01/2016   Demand ischemia of myocardium 07/31/2016   Snoring 02/22/2014   Daytime somnolence 02/22/2014   Snores 11/23/2013   Marijuana abuse    Morbid obesity    History of acute inferior wall MI 11/20/2013   ST elevation myocardial infarction (STEMI) of true posterior wall, subsequent episode of care 11/20/2013   CAD S/P percutaneous coronary angioplasty: DES PCI -Cx-OM1, RCA x 2 11/20/2013    Class: Status post   Essential hypertension    Hyperlipidemia LDL goal <70    HYPERGLYCEMIA 10/25/2009   ASTHMA 10/24/2009    Past Surgical History:  Procedure Laterality Date   LEFT HEART CATH AND CORONARY ANGIOGRAPHY N/A 08/01/2016   Procedure: Left Heart Cath and Coronary Angiography;  Surgeon: Tonny Bollman, MD;  Location: [facility] INVASIVE CV LAB;;  Stable 2 V CAD - patent RCA & OM stents. Diffuse,  non-obstructive CAD elsewhere. Moderately elevated LVEDP --> No culprit lesion  => restarted Plavix & titrate Med Rx.   LEFT HEART CATH AND CORONARY ANGIOGRAPHY N/A 10/24/2018   Procedure: LEFT HEART CATH AND CORONARY ANGIOGRAPHY;  Surgeon: Lennette Bihari, MD;  Location: [facility] INVASIVE CV LAB;  60% small D1, D2 has 3 branches 1 of which is small and appears to be 100%, LCx-OM widely patent with mild 55% stenosis in the jailed AV groove LCx. RCA has mild luminal irregularities 30% proximal with widely patent stents in the mid and distal RCA. (Culprit 100% small branch of D2)   LEFT HEART CATH AND CORONARY ANGIOGRAPHY  09/2009   (Presumably  in the setting of STEMI) mild RCA luminal Irregularities - MIld INf HK -- Med Rx; b)   LEFT HEART CATHETERIZATION WITH CORONARY ANGIOGRAM N/A 11/20/2013   Procedure: LEFT HEART CATHETERIZATION WITH CORONARY ANGIOGRAM;  Surgeon: Marykay Lex, MD;  Location: [facility] CATH LAB;  Service: Cardiovascular;   Inf STEMI: OM1 100%, m-dRCA 95-99% thrombotic mRCA with tandem 80% -- PCI, LAD & Cx relatively normal.   PERCUTANEOUS CORONARY STENT INTERVENTION (PCI-S)  11/20/2013   a) PCI - OM1 Promus Premier DES 3.5 mm x 38 mm, m-d RCA Promus Premier DES  3.0 mm x 20 mm & 3.0 mm x 16 mm   RIGHT/LEFT HEART CATH AND CORONARY ANGIOGRAPHY N/A 10/11/2021   Procedure: RIGHT/LEFT HEART CATH AND CORONARY ANGIOGRAPHY;  Surgeon: Laurey Morale, MD;  Location: [facility] INVASIVE CV LAB;  Service: Cardiovascular;  Laterality: N/A;   TRANSTHORACIC ECHOCARDIOGRAM  11/20/2013   LV EF 55-60%. No regional wall motion abnormality.  Normal valves    TRANSTHORACIC ECHOCARDIOGRAM  08/11/2016   (Non-STEMI-no culprit lesion): Moderately dilated LV.  Basal anterolateral HK with basal-mid inferolateral akinesis.  Mild-apical inferior akinesis.  EF 35 to 40%.  GRII DD.  Mild biatrial dilation.  Normal RV size and function. Mostly normal valves.   TRANSTHORACIC ECHOCARDIOGRAM  10/24/2018   (Non-STEMI-culprit lesion was 1 of 3 small 2 branches.)  EF 20-25%.  Mild mildly dilated LV.  GR 2 DD.  Mild biatrial dilation.  Normal PA pressures.       Home Medications    Prior to Admission medications   Medication Sig Start Date End Date Taking? Authorizing Provider  docusate sodium (COLACE) 100 MG capsule Take 1 capsule (100 mg total) by mouth every 12 (twelve) hours. 07/11/22  Yes Ellsworth Lennox, PA-C  hydrocortisone-pramoxine [facility]) rectal foam Place 1 applicator rectally 4 (four) times daily. 07/11/22  Yes Ellsworth Lennox, PA-C  traMADol (ULTRAM) 50 MG tablet Take 1 tablet (50 mg total) by mouth every 6 (six) hours as needed. 07/11/22  Yes Ellsworth Lennox, PA-C  acetaminophen (TYLENOL) 325 MG tablet Take 2 tablets (650 mg total) by mouth every 6 (six) hours as needed for mild pain (or Fever >/= 101). 06/07/22   Gonfa, Boyce Medici, MD  apixaban (ELIQUIS) 5 MG TABS tablet Take 1 tablet (5 mg total) by mouth 2 (two) times daily. Start after you finish starter pack Eliquis. 07/06/22   Almon Hercules, MD  aspirin EC 81 MG tablet Take 1 tablet (81 mg total) by mouth daily. 10/12/21   Robbie Lis M, PA-C  atorvastatin (LIPITOR) 80 MG tablet Take 1 tablet (80 mg total) by mouth daily at 6 PM 10/17/21   Milford, Anderson Malta, FNP  carvedilol (COREG) 3.125 MG tablet Take 3 tablets (9.375 mg total) by mouth 2 (two) times daily with a meal.  06/07/22   Almon Hercules, MD  dapagliflozin propanediol (FARXIGA) 10 MG TABS tablet Take 1 tablet (10 mg total) by mouth daily. 06/27/22   Anders Simmonds, PA-C  ezetimibe (ZETIA) 10 MG tablet Take 1 tablet (10 mg total) by mouth daily. 10/17/21   Milford, Anderson Malta, FNP  furosemide (LASIX) 20 MG tablet Take 1 tablet (20 mg total) by mouth daily. 10/17/21   Milford, Anderson Malta, FNP  nitroGLYCERIN (NITROSTAT) 0.4 MG SL tablet Place 1 tablet (0.4 mg total) under the tongue every 5 (five) minutes as needed for chest pain. 10/12/21   Robbie Lis M, PA-C  pantoprazole (PROTONIX) 40 MG tablet Take 1 tablet (40 mg total) by mouth daily. Patient not taking: Reported on 06/27/2022 06/08/22   Almon Hercules, MD  sacubitril-valsartan (ENTRESTO) 24-26 MG Take 1 tablet by mouth 2 (two) times daily. 06/21/22   Robbie Lis M, PA-C  senna-docusate (SENOKOT-S) 8.6-50 MG tablet Take 1 tablet by mouth 2 (two) times daily. Patient taking differently: Take 1 tablet by mouth 2 (two) times daily. As needed 06/07/22   Almon Hercules, MD  spironolactone (ALDACTONE) 25 MG tablet Take 1 tablet (25 mg total) by mouth daily. 10/17/21   Milford, Anderson Malta, FNP    Family History Family History  Problem Relation Age of Onset   Hypertension Mother     Heart attack Father 44       2 MIs by age 42 (first at 40)-- CABG   Hypertension Father    Heart failure Father    Hyperlipidemia Father    Heart attack Maternal Grandfather        60's   Heart attack Paternal Grandfather        49's   Heart attack Other     Social History Social History   Tobacco Use   Smoking status: Former    Types: Cigarettes    Quit date: 09/2016    Years since quitting: 5.8   Smokeless tobacco: Never  Vaping Use   Vaping Use: Every day  Substance Use Topics   Alcohol use: No    Alcohol/week: 0.0 standard drinks of alcohol   Drug use: No     Allergies   Penicillins   Review of Systems Review of Systems  Gastrointestinal:  Positive for rectal pain (hemorrhoids).     Physical Exam Triage Vital Signs ED Triage Vitals [07/11/22 1059]  Enc Vitals Group     BP 108/80     Pulse Rate 90     Resp 16     Temp 98.8 F (37.1 C)     Temp Source Oral     SpO2 97 %     Weight      Height      Head Circumference      Peak Flow      Pain Score 7     Pain Loc      Pain Edu?      Excl. in GC?    No data found.  Updated Vital Signs BP 108/80 (BP Location: Left Arm)   Pulse 90   Temp 98.8 F (37.1 C) (Oral)   Resp 16   SpO2 97%   Visual Acuity Right Eye Distance:   Left Eye Distance:   Bilateral Distance:    Right Eye Near:   Left Eye Near:    Bilateral Near:     Physical Exam Constitutional:      Appearance: Normal appearance.  Genitourinary:  Comments: Rectal: Multiple large hemorrhoids are present on the right side of the rectum without thrombosis.  There is no active bleeding or drainage from the site. Neurological:     Mental Status: He is alert.      UC Treatments / Results  Labs (all labs ordered are listed, but only abnormal results are displayed) Labs Reviewed - No data to display  EKG   Radiology No results found.  Procedures Procedures (including critical care time)  Medications Ordered in  UC Medications - No data to display  Initial Impression / Assessment and Plan / UC Course  I have reviewed the triage vital signs and the nursing notes.  Pertinent labs & imaging results that were available during my care of the patient were reviewed by me and considered in my medical decision making (see chart for details).    Plan: The diagnosis will be treated with the following: Rectal pain: A.  Tramodol 50 mg every 6 hours for pain relief. 2.  Hemorrhoids: A.  Proctofoam, apply to the area 3-4 times throughout the day to help reduce pain and discomfort. B.  Advised to use Epsom salt soaks frequently to help reduce pain and discomfort. C.  Colace 100 mg every 12 hours to help with stools. 3.  Internal referral to [facility] for evaluation hemorrhoids to determine if needs to be modified or more aggressive.  Final Clinical Impressions(s) / UC Diagnoses   Final diagnoses:  Rectal pain  Grade II hemorrhoids     Discharge Instructions      Take Colace 100 mg twice daily to help keep stools soft and avoid straining. Advised to use the Proctofoam and applied to the area 3-4 times a day on a regular basis. Advised to use Epsom salt soaks frequently to help reduce the size and discomfort of the hemorrhoids.  Internal referral has been made [facility], they should be calling you within the next 48 to 72 hours to arrange appointment to be evaluated.  Advised take Anaprox DS 550 mg every 12 hours to help relieve pain and discomfort.    ED Prescriptions     Medication Sig Dispense Auth. Provider   docusate sodium (COLACE) 100 MG capsule Take 1 capsule (100 mg total) by mouth every 12 (twelve) hours. 60 capsule Ellsworth Lennox, PA-C   hydrocortisone-pramoxine [facility]) rectal foam Place 1 applicator rectally 4 (four) times daily. 10 g Ellsworth Lennox, PA-C   traMADol (ULTRAM) 50 MG tablet Take 1 tablet (50 mg total) by mouth every 6 (six) hours as needed. 15 tablet  Ellsworth Lennox, PA-C      I have reviewed the PDMP during this encounter.   Ellsworth Lennox, PA-C 07/11/22 1122

## 2022-07-11 NOTE — ED Triage Notes (Signed)
Pt c/o itchy painful bottom concerned for abscess. Onset ~ 4 days ago.

## 2022-07-11 NOTE — Discharge Instructions (Signed)
Take Colace 100 mg twice daily to help keep stools soft and avoid straining. Advised to use the Proctofoam and applied to the area 3-4 times a day on a regular basis. Advised to use Epsom salt soaks frequently to help reduce the size and discomfort of the hemorrhoids.  Internal referral has been made [facility], they should be calling you within the next 48 to 72 hours to arrange appointment to be evaluated.  Advised take Anaprox DS 550 mg every 12 hours to help relieve pain and discomfort.

## 2022-07-12 ENCOUNTER — Other Ambulatory Visit (HOSPITAL_COMMUNITY): Payer: Self-pay

## 2022-07-12 NOTE — Telephone Encounter (Signed)
Sent different generic medication to pharmacy for patient due to cost of brand name script.

## 2022-07-13 ENCOUNTER — Other Ambulatory Visit (HOSPITAL_COMMUNITY): Payer: Self-pay

## 2022-07-16 ENCOUNTER — Other Ambulatory Visit (HOSPITAL_COMMUNITY): Payer: Self-pay

## 2022-07-17 ENCOUNTER — Other Ambulatory Visit (HOSPITAL_COMMUNITY): Payer: Self-pay

## 2022-07-18 ENCOUNTER — Telehealth (HOSPITAL_COMMUNITY): Payer: Self-pay

## 2022-07-18 NOTE — Telephone Encounter (Signed)
Called and left patient a voice message to confirm/remind patient of their appointment at the [facility] on 07/19/22.

## 2022-07-19 ENCOUNTER — Ambulatory Visit (HOSPITAL_COMMUNITY)
Admission: RE | Admit: 2022-07-19 | Discharge: 2022-07-19 | Disposition: A | Discharge status: Home / Self Care | Payer: BLUE CROSS/BLUE SHIELD | Source: Ambulatory Visit | Attending: Adult Health | Admitting: Adult Health

## 2022-07-19 ENCOUNTER — Encounter (HOSPITAL_COMMUNITY): Payer: Self-pay

## 2022-07-19 VITALS — BP 126/94 | HR 102 | Wt 256.6 lb

## 2022-07-19 DIAGNOSIS — Z7982 Long term (current) use of aspirin: Secondary | ICD-10-CM | POA: Diagnosis not present

## 2022-07-19 DIAGNOSIS — I428 Other cardiomyopathies: Secondary | ICD-10-CM | POA: Insufficient documentation

## 2022-07-19 DIAGNOSIS — I493 Ventricular premature depolarization: Secondary | ICD-10-CM | POA: Diagnosis not present

## 2022-07-19 DIAGNOSIS — K760 Fatty (change of) liver, not elsewhere classified: Secondary | ICD-10-CM | POA: Insufficient documentation

## 2022-07-19 DIAGNOSIS — Z8249 Family history of ischemic heart disease and other diseases of the circulatory system: Secondary | ICD-10-CM | POA: Insufficient documentation

## 2022-07-19 DIAGNOSIS — I2699 Other pulmonary embolism without acute cor pulmonale: Secondary | ICD-10-CM | POA: Insufficient documentation

## 2022-07-19 DIAGNOSIS — I252 Old myocardial infarction: Secondary | ICD-10-CM | POA: Diagnosis not present

## 2022-07-19 DIAGNOSIS — Z79899 Other long term (current) drug therapy: Secondary | ICD-10-CM | POA: Insufficient documentation

## 2022-07-19 DIAGNOSIS — E785 Hyperlipidemia, unspecified: Secondary | ICD-10-CM | POA: Insufficient documentation

## 2022-07-19 DIAGNOSIS — Z7901 Long term (current) use of anticoagulants: Secondary | ICD-10-CM | POA: Diagnosis not present

## 2022-07-19 DIAGNOSIS — I5022 Chronic systolic (congestive) heart failure: Secondary | ICD-10-CM | POA: Diagnosis not present

## 2022-07-19 DIAGNOSIS — I110 Hypertensive heart disease with heart failure: Secondary | ICD-10-CM | POA: Diagnosis present

## 2022-07-19 DIAGNOSIS — Z86711 Personal history of pulmonary embolism: Secondary | ICD-10-CM | POA: Diagnosis not present

## 2022-07-19 DIAGNOSIS — Z86718 Personal history of other venous thrombosis and embolism: Secondary | ICD-10-CM | POA: Insufficient documentation

## 2022-07-19 DIAGNOSIS — I2510 Atherosclerotic heart disease of native coronary artery without angina pectoris: Secondary | ICD-10-CM | POA: Diagnosis not present

## 2022-07-19 DIAGNOSIS — I255 Ischemic cardiomyopathy: Secondary | ICD-10-CM | POA: Insufficient documentation

## 2022-07-19 LAB — BASIC METABOLIC PANEL
Anion gap: 8 (ref 5–15)
BUN: 14 mg/dL (ref 6–20)
CO2: 22 mmol/L (ref 22–32)
Calcium: 8.4 mg/dL — ABNORMAL LOW (ref 8.9–10.3)
Chloride: 107 mmol/L (ref 98–111)
Creatinine, Ser: 0.92 mg/dL (ref 0.61–1.24)
GFR, Estimated: >60 mL/min (ref >60)
Glucose, Bld: 117 mg/dL — ABNORMAL HIGH (ref 70–99)
Potassium: 3.6 mmol/L (ref 3.5–5.1)
Sodium: 137 mmol/L (ref 135–145)

## 2022-07-19 LAB — BRAIN NATRIURETIC PEPTIDE: B Natriuretic Peptide: 1391.4 pg/mL — ABNORMAL HIGH (ref 0.0–100.0)

## 2022-07-19 NOTE — Progress Notes (Signed)
ReDS Vest / Clip - 07/19/22 0958       ReDS Vest / Clip   Station Marker D    Ruler Value 46    ReDS Value Range High volume overload    ReDS Actual Value 46    Anatomical Comments sitting

## 2022-07-19 NOTE — Patient Instructions (Addendum)
Thank you for coming in today  Labs were done today, if any labs are abnormal the clinic will call you No news is good news  Medications: Stop Lasix Start Torsemide 20 mg daily 07/20/2022 TODAY 07/19/2022 take 40 mg of Torsemide START Digoxin 0.125 mg 1 tablet daily   Follow up appointments:  Your physician recommends that you schedule a follow-up appointment in:  1 week in clinic  You are scheduled for a Cardiopulmonary Exercise (CPX) Test as [facility] on: Date: May 20th, 2024   Time: 11:00 a.m.   Expect to be in the lab for 2 hours. Please plan to arrive 30 minutes prior to your appointment. You may be asked to reschedule your test if you arrive 20 minutes or more after your scheduled appointment time.  Main Campus address: [address] [place], [place] [address] You may arrive to the Main Entrance A or Entrance C (free valet parking is available at both). -Main Entrance A (on [address]) :proceed to admitting for check in -Entrance C (on [facility]): proceed to [employer] parking or under hospital deck parking using this code _________  Check In: [facility] waiting room (1st floor)   General Instructions for the day of the test (Please follow all instructions from your physician): Refrain from ingesting a heavy meal, alcohol, or caffeine or using tobacco products within 2 hours of the test (DO NOT FAST for mare than 8 hours). You may have all other non-alcoholic, non -caffeinated beverage,a light snack (crackers,a piece of fruit, carrot sticks, toast bagel,etc) up to your appointment. Avoid significant exertion or exercise within 24 hours of your test. Be prepared to exercise and sweat. Your clothing should permit freedom of movement and include walking or running shoes. Women bring loose fitting short sleeved blouse.  This evaluation may be fatiguing and you may wish ti have someone accompany you to the assessment to drive you home afterward. Bring a  list of your medications with you, including dosage and frequency you take the medications (  I.e.,once per day, twice per day, etc). Take all medications as prescribed, unless noted below or instructed to do so by your physician.  Please do not take the following medications prior to your CPX:  _________________________________________________  _________________________________________________  Brief description of the test: A brief lung test will be performed. This will involve you taking deep breaths and blowing hard and fast through your mouth. During these , a clip will be on your nose and you will be breathing through a breathing device.   For the exercise portion of the test you will be walking on a treadmill, or riding a stationary bike, to your maximal effor or until symptoms such as chest pain, shortness of breath, leg pain or dizziness limit your exercise. You will be breathing in and out of a breathing device through your mouth (a clip will be on your nose again). Your heart rate, ECG, blood pressure, oxygen saturations, breathing rate and depth, amount of oxygen you consume and amount of carbon dioxide you produce will be measured and monitored throughout the exercise test.  If you need to cancel or reschedule your appointment please call [phone] If you have further questions please call your physician or Philip Aspen, MS, ACSM-RCEP at [phone]       Do the following things EVERYDAY: Weigh yourself in the morning before breakfast. Write it down and keep it in a log. Take your medicines as prescribed Eat low salt foods--Limit  salt (sodium) to 2000 mg per day.  Stay as active as you can everyday Limit all fluids for the day to less than 2 liters   At the [facility], you and your health needs are our priority. As part of our continuing mission to provide you with exceptional heart care, we have created designated Provider Care Teams. These Care Teams  include your primary Cardiologist (physician) and Advanced Practice Providers (APPs- Physician Assistants and Nurse Practitioners) who all work together to provide you with the care you need, when you need it.   You may see any of the following providers on your designated Care Team at your next follow up: Dr Arvilla Meres Dr Marca Ancona Dr. Marcos Eke, NP Robbie Lis, [place] [facility] [place], [place] Brynda Peon, NP Karle Plumber, PharmD   Please be sure to bring in all your medications bottles to every appointment.    Thank you for choosing [facility]  If you have any questions or concerns before your next appointment please send [place] a message through [place] or call our office at [phone].    TO LEAVE A MESSAGE FOR THE NURSE SELECT OPTION 2, PLEASE LEAVE A MESSAGE INCLUDING: YOUR NAME DATE OF BIRTH CALL BACK NUMBER REASON FOR CALL**this is important as we prioritize the call backs  YOU WILL RECEIVE A CALL BACK THE SAME DAY AS LONG AS YOU CALL BEFORE 4:00 PM

## 2022-07-19 NOTE — Progress Notes (Signed)
ADVANCED HF CLINIC Follow UP   Primary Care: Marcine Matar, MD HF Cardiologist: Dr. Shirlee Latch  HPI: Reginald Fernandez is a 42 y.o. male w/ h/o premature CAD s/p multiple MIs, initial inferior STEMI in 2011, HTN, HLD, tobacco use and poor compliance.    H/o inferior STEMI by ECG in 09/2009 with non-obstructive CAD by cath @ that time.    Admitted 11/2013 w/ CP and inferolateral STE c/w STEMI. Emergent cath showed severe RCA and OM1 disease, both treated w/ PCI + DES. Echo showed normal EF, 55-60%. Lost to f/u.    Admitted 07/2016 w/ ACS. Trop I peaked 0.70. Cath showed two vessel disease with patent stents in the RCA, and OM of the Lcx with normal LVEDP. Unable to identify the culprit for Troponin elevation. He was restarted on plavix post cath. Follow up echo showed drop in EF down to 35-40% with basal anterolateral hypokinesis, and mid to apical akinesis, and G2DD. GDMT adjusted but unfortunately lost to f/u again.    Readmitted 10/2018 for left sided chest pain. Admitted to poor med compliance. Ruled in for NSTEMI. High-sensitivity troponin peaked 9,501. Cath demonstrated the following   Multivessel CAD with mild luminal irregularity of the LAD and evidence for 60% narrowing in a very small first diagonal vessel.  The second diagonal vessel bifurcates in its mid segment and there appears to be total occlusion of the inferior limb after the bifurcation.  (This was not able to be depicted on the diagram); the stent in the circumflex vessel which extends into the OM-1 vessel was widely patent and there is ostial pinching of the AV groove circumflex with narrowing of 50 to 60%; the RCA has luminal irregularity with 30% proximal and proximal to mid stenoses.  The mid RCA stent and distal RCA stent is widely patent.   He was treated medically. Echo showed further reduction in LV systolic function w/ EF 20-25%, GIIDD, RV mildly reduced.    Repeat Echo 10/21 showed slightly improved EF, 30-35%, RV normal.     Unfortunately, was lost to f/u again and had been off meds for > 1 year. Admitted 7/23 with a/c CHF and epigastric abdominal pain. Labs notable for elevated TBili, at 2.9. LFTs normal. Lipase mildly elevated at 53. CT of A/P showed diffuse fatty infiltration of the liver + mild gallbladder wall edema, possibly related to liver disease. No gallstones. Pancrease unremarkable. Diuresed with IV lasix. Echo showed EF < 20%, RV severely reduced. LHC showed diffuse CAD but no severe stenosis to explain worsening of his EF. RHC mildly elevated PCWP w/ pulmonary venous hypertension, normal RA pressure and preserved CO. GDMT titrated. Discharged home, weight 253 lbs. Reestablished care in the [facility].   Recently admitted last month. Initially seen in the ED on 06/02/2022  with shortness of breath and abdominal pain, treated with a dose of diuretics and discharged home on higher dose Lasix. Symptoms worsened when he returned home. Came back to the ED, VSS, troponin up to 220, creatinine 1.2, BNP 745, chest x-ray with cardiomegaly, mild pulmonary vascular congestion.  Inititial CT of chest with small pleural effusion, groundglass opacities in lower lungs concerning for edema, ?pyelonephritis. Admitted, diuresed and placed on abx. Farxiga discontinued. Developed worsening chest and abdominal pain and underwent repeat CT on 3/18 which was + for PE. LE venous dopplers + DVT (Rt popliteal vein). 2D Echo showed severe biventricular dysfunction, LVEF < 20%, RV moderately enlarged and severely reduced systolic function. No visibile thrombus. This was  unchanged from prior study. Placed on heparin and eventually transitioned to Eliquis. Instructed to finish abx for UTI before resuming [place]. Discharged home on 3/21.  He was seen in the HF clinc 06/21/22. Started on entresto 24/26 twice a day + farxiga 10 mg daily.   Today he returns for HF follow up.Overall feeling ok but over the last 3 days he noticed congestion and orthopnea   Denies PND. Drinks lots of fluids. Follows low salt diet. Appetite ok. No fever or chills. Weight at home 254-256  pounds. Taking all medications. He does not smoke and drink alcohol. 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  Lives with his 3 children.   Labs (7/23): K 4.0, creatinine 0.95, LDL 130, HDL, HDL 24 Labs (3/24): K 4.4, creatinine 0.93, Tbili 1.3, AST and ALT normal  Labs (06/21/2022): K 4.9 Creatinine 1.1   Cardiac Studies  - Echo (10/21): EF 30-35%, moderately decreased LV function with global HK, normal RV - Echo (7/23): EF < 20%, severe LV dilation, RV severe dysfunction.  - Echo (3/24): EF < 20%, RV severely reduced   - R/LHC (7/23): diffuse CAD but no severe stenosis to explain worsening of his EF, mildly elevated PCWP w/ pulmonary venous hypertension, normal RA pressure and preserved CO.   RA mean 4 RV 36/3 PA 43/22, mean 31 PCWP mean 16 LV 136/23 AO 135/86 Oxygen saturations: PA 72% AO 93% Cardiac Output (Fick) 7.57  Cardiac Index (Fick) 3.12 PVR 2 WU     1st Diag lesion is 60% stenosed.   Prox RCA-1 lesion is 55% stenosed.   Mid RCA lesion is 40% stenosed.   Prox RCA-2 lesion is 30% stenosed.   Mid LAD lesion is 40% stenosed.   Prox Cx to Dist Cx lesion is 50% stenosed.   Non-stenotic Prox Cx lesion was previously treated.   Non-stenotic Prox RCA to Mid RCA lesion was previously treated.   Non-stenotic Dist RCA lesion was previously treated.   Non-stenotic 1st Mrg lesion was previously treated   - LHC (8/20): Prox RCA-1 lesion is 30% stenosed. Prox RCA-2 lesion is 30% stenosed. Previously placed Prox RCA to Mid RCA stent (unknown type) is widely patent. Previously placed Dist RCA stent (unknown type) is widely patent. Previously placed 1st Mrg stent (unknown type) is widely patent. Previously placed Prox Cx stent (unknown type) is widely patent. Prox Cx to Mid Cx lesion is 55% stenosed. 1st Diag lesion is 60% stenosed.     Past Medical History:  Diagnosis Date   CAD S/P percutaneous coronary angioplasty 11/20/2013   a. 09/2009: EKG with Inf STEMI - no obstructive CAD;  b. 11/2013 Inflat STEMI/PCI: LM nl, LAD 20p, D1 sm - diff dzs, D2 large - nl, LCX 95-99, OM1 100 (3.5x38 Promus Premier DES), RCA 95-39m, 80d (3.0x20 and 3.0x16 Promus Premier DES') - normal EF;  c. NSTEMI 5/18 - patent stents, otw minimal CAD.- EF by Echo 35-40%; d. NSTEMI 10/2018 - ? culprit - Small branch of  D2,Med Rx,.  EF by Echo ~25%   Cardiomyopathy, ischemic 07/2016   h/o Inf STEMI 11/2013 - EF was "Normal"; b) NSTEMI 07/2016 (NO Cultprit on Cath) - Echo EF 35-40% (Basal Ant-Lat HK, basal-mid Inferolateral & apical Akinesis); c) NSTEMI 10/2018 -felt to be occluded small branch of D2, Echo EF further decreased to 20-25%.    Essential hypertension    Hyperlipidemia with target LDL less than 100    Marijuana abuse  Migraine    Morbid obesity (HCC)    ST-segment elevation myocardial infarction (STEMI) of inferior wall (HCC) 11/20/2013   Current Outpatient Medications  Medication Sig Dispense Refill   acetaminophen (TYLENOL) 325 MG tablet Take 2 tablets (650 mg total) by mouth every 6 (six) hours as needed for mild pain (or Fever >/= 101).     apixaban (ELIQUIS) 5 MG TABS tablet Take 1 tablet (5 mg total) by mouth 2 (two) times daily. Start after you finish starter pack Eliquis. 60 tablet 2   aspirin EC 81 MG tablet Take 1 tablet (81 mg total) by mouth daily. 30 tablet 5   atorvastatin (LIPITOR) 80 MG tablet Take 1 tablet (80 mg total) by mouth daily at 6 PM 90 tablet 3   carvedilol (COREG) 3.125 MG tablet Take 3 tablets (9.375 mg total) by mouth 2 (two) times daily with a meal. 180 tablet 2   dapagliflozin propanediol (FARXIGA) 10 MG TABS tablet Take 1 tablet (10 mg total) by mouth daily. 90 tablet 3   docusate sodium (COLACE) 100 MG capsule Take 1 capsule (100 mg total) by mouth every 12 (twelve) hours. (Patient taking differently: Take 100 mg  by mouth as needed.) 60 capsule 0   ezetimibe (ZETIA) 10 MG tablet Take 1 tablet (10 mg total) by mouth daily. 30 tablet 5   furosemide (LASIX) 20 MG tablet Take 1 tablet (20 mg total) by mouth daily. 30 tablet 5   nitroGLYCERIN (NITROSTAT) 0.4 MG SL tablet Place 1 tablet (0.4 mg total) under the tongue every 5 (five) minutes as needed for chest pain. 25 tablet 2   pantoprazole (PROTONIX) 40 MG tablet Take 1 tablet (40 mg total) by mouth daily. (Patient taking differently: Take 40 mg by mouth daily. As needed) 30 tablet 1   sacubitril-valsartan (ENTRESTO) 24-26 MG Take 1 tablet by mouth 2 (two) times daily. 60 tablet 11   senna-docusate (SENOKOT-S) 8.6-50 MG tablet Take 1 tablet by mouth 2 (two) times daily. (Patient taking differently: Take 1 tablet by mouth 2 (two) times daily. As needed)     spironolactone (ALDACTONE) 25 MG tablet Take 1 tablet (25 mg total) by mouth daily. 30 tablet 5   traMADol (ULTRAM) 50 MG tablet Take 1 tablet (50 mg total) by mouth every 6 (six) hours as needed. 15 tablet 0   No current facility-administered medications for this encounter.   Allergies  Allergen Reactions   Penicillins Other (See Comments)    Childhood allergy Unknown reaction  Did it involve swelling of the face/tongue/throat, SOB, or low BP? No Did it involve sudden or severe rash/hives, skin peeling, or any reaction on the inside of your mouth or nose? No Did you need to seek medical attention at a hospital or doctor's office? No When did it last happen?      child If all above answers are "NO", may proceed with cephalosporin use.   Social History   Socioeconomic History   Marital status: Married    Spouse name: Not on file   Number of children: 2   Years of education: Not on file   Highest education level: Not on file  Occupational History    Employer: [employer]    Comment: [place]  Tobacco Use   Smoking status: Former    Types: Cigarettes    Quit date:  09/2016    Years since quitting: 5.8   Smokeless tobacco: Never  Vaping Use   Vaping Use: Every day  Substance and Sexual Activity   Alcohol use: No    Alcohol/week: 0.0 standard drinks of alcohol   Drug use: No   Sexual activity: Yes  Other Topics Concern   Not on file  Social History Narrative   Lives in [place] with wife.  Works with mentally challenged adults.   Social Determinants of Health   Financial Resource Strain: Not on file  Food Insecurity: No Food Insecurity (06/04/2022)   Hunger Vital Sign    Worried About Running Out of Food in the Last Year: Never true    Ran Out of Food in the Last Year: Never true  Transportation Needs: No Transportation Needs (06/04/2022)   PRAPARE - Administrator, Civil Service (Medical): No    Lack of Transportation (Non-Medical): No  Physical Activity: Not on file  Stress: Not on file  Social Connections: Not on file  Intimate Partner Violence: Not At Risk (06/04/2022)   Humiliation, Afraid, Rape, and Kick questionnaire    Fear of Current or Ex-Partner: No    Emotionally Abused: No    Physically Abused: No    Sexually Abused: No   Family History  Problem Relation Age of Onset   Hypertension Mother    Heart attack Father 40       2 MIs by age 83 (first at 80)-- CABG   Hypertension Father    Heart failure Father    Hyperlipidemia Father    Heart attack Maternal Grandfather        5's   Heart attack Paternal Grandfather        68's   Heart attack Other    Father w/ premature CAD, first MI at age 40. Paternal grandfather, maternal grandfather and uncle all w/ MIs in their early 61s. No known family history of CHF.   BP (!) 126/94   Pulse (!) 102   Wt 116.4 kg (256 lb 9.6 oz)   SpO2 98%   BMI 32.95 kg/m   Wt Readings from Last 3 Encounters:  07/19/22 116.4 kg (256 lb 9.6 oz)  06/27/22 113 kg (249 lb 3.2 oz)  06/21/22 109.7 kg (241 lb 12.8 oz)   PHYSICAL EXAM: General:  Walked in the clinic. No resp  difficulty HEENT: normal Neck: supple. JVP 10-11. Carotids 2+ bilat; no bruits. No lymphadenopathy or thryomegaly appreciated. Cor: PMI nondisplaced. Regular rate & rhythm. No rubs, gallops or murmurs. Lungs: clear Abdomen: soft, nontender, distended. No hepatosplenomegaly. No bruits or masses. Good bowel sounds. Extremities: no cyanosis, clubbing, rash, edema Neuro: alert & orientedx3, cranial nerves grossly intact. moves all 4 extremities w/o difficulty. Affect pleasant  EKG: SR 98 bpm personally checked.   ASSESSMENT & PLAN: 1. Chronic systolic CHF: Ischemic cardiomyopathy.  ? Component of NICM d/t HTN +/- PVCs. Last echo in 10/21, EF 30-35%.  EF as low as 20-25% in 8/20.  Echo 7/23 EF < 20%, RV severely reduced. [facility] 7/23 demonstrated diffuse CAD but no severe stenosis to explain worsening of his EF. RHC mildly elevated PCWP w/ pulmonary venous hypertension, normal RA pressure and preserved CO. Most recent eco 3/24 EF < 20%, RV severely reduced (unchanged from prior).  -NYHA III - Reds Clip 46%. On exam he is volume overloaded. Stop lasix. Start torsemide. Today he will take 40 mg torsemide then tomorrow he will start 20 mg torsemide daily. - Continue 24-26 mg bid (confirmed w/ pharmacy, qualifies for $10 copay)  - Continue Farxiga 10 mg daily. Monitor for GU symptoms  -  Continue Spironolactone 25 mg daily  - Continue Coreg  6.25 mg bid. - Add digoxin 0.125 mg daily   - Set up  CPX.  - Refer back to EP for ICD. He was given the number to call.  - May need to start thinking about referral to [facility] for transplant evaluation.  2. DVT/PE: Diagnosed 3/24, LE dopplers + Rt popliteal DVT, Chest CT + acute pulmonary embolism with moderate embolic burden. No CT evidence of right heart strain. Echo EF < 20%, RV mod reduced (unchanged from prior) - continue Eliquis, reports full compliance.  No bleeding issues.  3. CAD: Strong family history of premature CAD.  H/o inferolateral STEMI in 9/15 with  DES to LCx into OM1 and DES to RCA.  LHC this admit 7/23 demonstrated diffuse CAD but no severe stenosis to explain worsening of his EF.  - No chest pain.  - Continue ASA 81 mg daily. - Continue atorvastatin 80 mg daily + Zetia 10 mg daily. 4. PVCs:  -Waiting on approval for sleep study.  - continue Coreg 6.25 mg bid  5. HTN: Controlled. - Continue GDMT as above. 6. Hyperlipidemia: Goal LDL < 55. Last LP 3/24 w/ LDL 135 despite Lipitor 80 + Zetia  - Continue statin + Zetia.  - He was referred to Lipid Clinic but he didn't answer. Will refer back to Lipid CLinic.  6. Fatty liver disease: Noted on CT abdomen.    Follow up with APP next to assess volume status. Asked him call back if he feels worse and we will work him. If no improvement next week would set up for RHC. Needs to return to  Dr Shirlee Latch in next few months.   Tonye Becket, NP-C  07/19/22

## 2022-07-20 ENCOUNTER — Other Ambulatory Visit (HOSPITAL_COMMUNITY): Payer: Self-pay

## 2022-07-20 MED ORDER — DIGOXIN 125 MCG PO TABS
0.1250 mg | ORAL_TABLET | Freq: Every day | ORAL | 3 refills | Status: DC
  Filled 2022-07-20: qty 90, 90d supply, fill #0
  Filled 2023-03-18: qty 90, 90d supply, fill #1

## 2022-07-20 MED ORDER — TORSEMIDE 20 MG PO TABS
20.0000 mg | ORAL_TABLET | Freq: Two times a day (BID) | ORAL | 3 refills | Status: DC
  Filled 2022-07-20: qty 180, 90d supply, fill #0
  Filled 2023-03-18: qty 180, 90d supply, fill #1

## 2022-07-23 ENCOUNTER — Other Ambulatory Visit (HOSPITAL_COMMUNITY): Payer: Self-pay

## 2022-07-26 ENCOUNTER — Other Ambulatory Visit: Payer: Self-pay

## 2022-07-27 ENCOUNTER — Other Ambulatory Visit: Payer: Self-pay

## 2022-07-27 ENCOUNTER — Other Ambulatory Visit (HOSPITAL_COMMUNITY): Payer: Self-pay

## 2022-07-27 ENCOUNTER — Encounter (HOSPITAL_COMMUNITY): Payer: BLUE CROSS/BLUE SHIELD

## 2022-07-30 ENCOUNTER — Other Ambulatory Visit (HOSPITAL_COMMUNITY): Payer: Self-pay

## 2022-08-02 ENCOUNTER — Other Ambulatory Visit: Payer: Self-pay

## 2022-08-06 ENCOUNTER — Encounter (HOSPITAL_COMMUNITY): Payer: BLUE CROSS/BLUE SHIELD

## 2022-08-14 ENCOUNTER — Other Ambulatory Visit (HOSPITAL_COMMUNITY): Payer: Self-pay

## 2022-10-01 ENCOUNTER — Ambulatory Visit: Payer: BLUE CROSS/BLUE SHIELD | Admitting: Internal Medicine

## 2023-03-18 ENCOUNTER — Other Ambulatory Visit (HOSPITAL_COMMUNITY): Payer: Self-pay

## 2023-03-18 ENCOUNTER — Other Ambulatory Visit (HOSPITAL_COMMUNITY): Payer: Self-pay | Admitting: Family Medicine

## 2023-03-18 MED ORDER — EZETIMIBE 10 MG PO TABS
10.0000 mg | ORAL_TABLET | Freq: Every day | ORAL | 1 refills | Status: DC
  Filled 2023-03-18: qty 30, 30d supply, fill #0

## 2023-03-18 MED ORDER — ATORVASTATIN CALCIUM 80 MG PO TABS
80.0000 mg | ORAL_TABLET | Freq: Every day | ORAL | 1 refills | Status: DC
  Filled 2023-03-18: qty 90, 90d supply, fill #0

## 2023-08-03 ENCOUNTER — Inpatient Hospital Stay (HOSPITAL_COMMUNITY)
Admission: EM | Admit: 2023-08-03 | Discharge: 2023-08-10 | DRG: 175 | Disposition: A | Discharge status: Home / Self Care | Attending: Family Medicine | Admitting: Family Medicine

## 2023-08-03 ENCOUNTER — Other Ambulatory Visit: Payer: Self-pay

## 2023-08-03 ENCOUNTER — Emergency Department (HOSPITAL_COMMUNITY)

## 2023-08-03 ENCOUNTER — Encounter (HOSPITAL_COMMUNITY): Payer: Self-pay | Admitting: Emergency Medicine

## 2023-08-03 DIAGNOSIS — Z6833 Body mass index (BMI) 33.0-33.9, adult: Secondary | ICD-10-CM

## 2023-08-03 DIAGNOSIS — I5023 Acute on chronic systolic (congestive) heart failure: Secondary | ICD-10-CM | POA: Diagnosis not present

## 2023-08-03 DIAGNOSIS — J189 Pneumonia, unspecified organism: Secondary | ICD-10-CM | POA: Diagnosis not present

## 2023-08-03 DIAGNOSIS — F17290 Nicotine dependence, other tobacco product, uncomplicated: Secondary | ICD-10-CM | POA: Diagnosis present

## 2023-08-03 DIAGNOSIS — Z83438 Family history of other disorder of lipoprotein metabolism and other lipidemia: Secondary | ICD-10-CM

## 2023-08-03 DIAGNOSIS — M7989 Other specified soft tissue disorders: Secondary | ICD-10-CM | POA: Diagnosis not present

## 2023-08-03 DIAGNOSIS — Z1152 Encounter for screening for COVID-19: Secondary | ICD-10-CM

## 2023-08-03 DIAGNOSIS — I255 Ischemic cardiomyopathy: Secondary | ICD-10-CM | POA: Diagnosis present

## 2023-08-03 DIAGNOSIS — K760 Fatty (change of) liver, not elsewhere classified: Secondary | ICD-10-CM | POA: Diagnosis present

## 2023-08-03 DIAGNOSIS — I2510 Atherosclerotic heart disease of native coronary artery without angina pectoris: Secondary | ICD-10-CM | POA: Diagnosis present

## 2023-08-03 DIAGNOSIS — E66811 Obesity, class 1: Secondary | ICD-10-CM | POA: Diagnosis present

## 2023-08-03 DIAGNOSIS — I2699 Other pulmonary embolism without acute cor pulmonale: Principal | ICD-10-CM | POA: Diagnosis present

## 2023-08-03 DIAGNOSIS — I5082 Biventricular heart failure: Secondary | ICD-10-CM | POA: Diagnosis present

## 2023-08-03 DIAGNOSIS — I252 Old myocardial infarction: Secondary | ICD-10-CM

## 2023-08-03 DIAGNOSIS — R042 Hemoptysis: Secondary | ICD-10-CM | POA: Diagnosis not present

## 2023-08-03 DIAGNOSIS — E785 Hyperlipidemia, unspecified: Secondary | ICD-10-CM | POA: Diagnosis present

## 2023-08-03 DIAGNOSIS — I493 Ventricular premature depolarization: Secondary | ICD-10-CM | POA: Diagnosis present

## 2023-08-03 DIAGNOSIS — I700 Atherosclerosis of aorta: Secondary | ICD-10-CM | POA: Diagnosis present

## 2023-08-03 DIAGNOSIS — I5021 Acute systolic (congestive) heart failure: Secondary | ICD-10-CM | POA: Diagnosis not present

## 2023-08-03 DIAGNOSIS — Z91148 Patient's other noncompliance with medication regimen for other reason: Secondary | ICD-10-CM

## 2023-08-03 DIAGNOSIS — Z955 Presence of coronary angioplasty implant and graft: Secondary | ICD-10-CM

## 2023-08-03 DIAGNOSIS — Z79899 Other long term (current) drug therapy: Secondary | ICD-10-CM

## 2023-08-03 DIAGNOSIS — Z7901 Long term (current) use of anticoagulants: Secondary | ICD-10-CM | POA: Diagnosis not present

## 2023-08-03 DIAGNOSIS — I2694 Multiple subsegmental pulmonary emboli without acute cor pulmonale: Secondary | ICD-10-CM | POA: Diagnosis not present

## 2023-08-03 DIAGNOSIS — R0489 Hemorrhage from other sites in respiratory passages: Secondary | ICD-10-CM | POA: Diagnosis present

## 2023-08-03 DIAGNOSIS — Z7982 Long term (current) use of aspirin: Secondary | ICD-10-CM | POA: Diagnosis not present

## 2023-08-03 DIAGNOSIS — Z880 Allergy status to penicillin: Secondary | ICD-10-CM | POA: Diagnosis not present

## 2023-08-03 DIAGNOSIS — I428 Other cardiomyopathies: Secondary | ICD-10-CM | POA: Diagnosis present

## 2023-08-03 DIAGNOSIS — I2693 Single subsegmental pulmonary embolism without acute cor pulmonale: Secondary | ICD-10-CM | POA: Diagnosis not present

## 2023-08-03 DIAGNOSIS — I2609 Other pulmonary embolism with acute cor pulmonale: Secondary | ICD-10-CM | POA: Diagnosis not present

## 2023-08-03 DIAGNOSIS — I110 Hypertensive heart disease with heart failure: Secondary | ICD-10-CM | POA: Diagnosis present

## 2023-08-03 DIAGNOSIS — Z8249 Family history of ischemic heart disease and other diseases of the circulatory system: Secondary | ICD-10-CM | POA: Diagnosis not present

## 2023-08-03 LAB — DIGOXIN LEVEL: Digoxin Level: <0.2 ng/mL — ABNORMAL LOW (ref 0.8–2.0)

## 2023-08-03 LAB — TROPONIN I (HIGH SENSITIVITY)
Troponin I (High Sensitivity): 30 ng/L — ABNORMAL HIGH (ref <18)
Troponin I (High Sensitivity): 29 ng/L — ABNORMAL HIGH (ref <18)

## 2023-08-03 LAB — CBC
HCT: 48.2 % (ref 39.0–52.0)
Hemoglobin: 16.5 g/dL (ref 13.0–17.0)
MCH: 30.7 pg (ref 26.0–34.0)
MCHC: 34.2 g/dL (ref 30.0–36.0)
MCV: 89.8 fL (ref 80.0–100.0)
Platelets: 202 10*3/uL (ref 150–400)
RBC: 5.37 MIL/uL (ref 4.22–5.81)
RDW: 13.3 % (ref 11.5–15.5)
WBC: 13.4 10*3/uL — ABNORMAL HIGH (ref 4.0–10.5)
nRBC: 0 % (ref 0.0–0.2)

## 2023-08-03 LAB — BASIC METABOLIC PANEL WITH GFR
Anion gap: 12 (ref 5–15)
BUN: 12 mg/dL (ref 6–20)
CO2: 25 mmol/L (ref 22–32)
Calcium: 8.9 mg/dL (ref 8.9–10.3)
Chloride: 99 mmol/L (ref 98–111)
Creatinine, Ser: 1.17 mg/dL (ref 0.61–1.24)
GFR, Estimated: >60 mL/min (ref >60)
Glucose, Bld: 191 mg/dL — ABNORMAL HIGH (ref 70–99)
Potassium: 3.9 mmol/L (ref 3.5–5.1)
Sodium: 136 mmol/L (ref 135–145)

## 2023-08-03 LAB — HIV ANTIBODY (ROUTINE TESTING W REFLEX): HIV Screen 4th Generation wRfx: NONREACTIVE

## 2023-08-03 LAB — HEPARIN LEVEL (UNFRACTIONATED): Heparin Unfractionated: 0.14 [IU]/mL — ABNORMAL LOW (ref 0.30–0.70)

## 2023-08-03 LAB — BRAIN NATRIURETIC PEPTIDE: B Natriuretic Peptide: 892.8 pg/mL — ABNORMAL HIGH (ref 0.0–100.0)

## 2023-08-03 MED ORDER — ASPIRIN 81 MG PO TBEC
81.0000 mg | DELAYED_RELEASE_TABLET | Freq: Every day | ORAL | Status: DC
  Administered 2023-08-03 – 2023-08-10 (×8): 81 mg via ORAL
  Filled 2023-08-03 (×8): qty 1

## 2023-08-03 MED ORDER — GUAIFENESIN ER 600 MG PO TB12
600.0000 mg | ORAL_TABLET | Freq: Two times a day (BID) | ORAL | Status: DC
  Administered 2023-08-03 – 2023-08-10 (×14): 600 mg via ORAL
  Filled 2023-08-03 (×14): qty 1

## 2023-08-03 MED ORDER — CARVEDILOL 6.25 MG PO TABS
6.2500 mg | ORAL_TABLET | Freq: Two times a day (BID) | ORAL | Status: DC
  Administered 2023-08-03 – 2023-08-05 (×4): 6.25 mg via ORAL
  Filled 2023-08-03 (×4): qty 1

## 2023-08-03 MED ORDER — MELATONIN 5 MG PO TABS
5.0000 mg | ORAL_TABLET | Freq: Every evening | ORAL | Status: DC | PRN
  Administered 2023-08-03 – 2023-08-08 (×6): 5 mg via ORAL
  Filled 2023-08-03 (×6): qty 1

## 2023-08-03 MED ORDER — SODIUM CHLORIDE 0.9 % IV SOLN
1.0000 g | INTRAVENOUS | Status: AC
  Administered 2023-08-04 – 2023-08-07 (×4): 1 g via INTRAVENOUS
  Filled 2023-08-03 (×4): qty 10

## 2023-08-03 MED ORDER — SODIUM CHLORIDE 0.9 % IV SOLN
500.0000 mg | Freq: Once | INTRAVENOUS | Status: AC
  Administered 2023-08-03: 500 mg via INTRAVENOUS
  Filled 2023-08-03: qty 5

## 2023-08-03 MED ORDER — MORPHINE SULFATE (PF) 4 MG/ML IV SOLN
4.0000 mg | Freq: Once | INTRAVENOUS | Status: AC
  Administered 2023-08-03: 4 mg via INTRAVENOUS
  Filled 2023-08-03: qty 1

## 2023-08-03 MED ORDER — AZITHROMYCIN 500 MG PO TABS
500.0000 mg | ORAL_TABLET | Freq: Every day | ORAL | Status: AC
  Administered 2023-08-04 – 2023-08-05 (×2): 500 mg via ORAL
  Filled 2023-08-03 (×2): qty 1

## 2023-08-03 MED ORDER — CARVEDILOL 3.125 MG PO TABS: 9.3750 mg | ORAL_TABLET | Freq: Two times a day (BID) | ORAL | Status: DC

## 2023-08-03 MED ORDER — HEPARIN BOLUS VIA INFUSION
3000.0000 [IU] | Freq: Once | INTRAVENOUS | Status: AC
  Administered 2023-08-04: 3000 [IU] via INTRAVENOUS
  Filled 2023-08-03: qty 3000

## 2023-08-03 MED ORDER — PROCHLORPERAZINE EDISYLATE 10 MG/2ML IJ SOLN
5.0000 mg | Freq: Four times a day (QID) | INTRAMUSCULAR | Status: DC | PRN
  Administered 2023-08-03 – 2023-08-04 (×2): 5 mg via INTRAVENOUS
  Filled 2023-08-03 (×2): qty 2

## 2023-08-03 MED ORDER — HEPARIN (PORCINE) 25000 UT/250ML-% IV SOLN
2000.0000 [IU]/h | INTRAVENOUS | Status: DC
  Administered 2023-08-03: 1700 [IU]/h via INTRAVENOUS
  Administered 2023-08-04 (×2): 1900 [IU]/h via INTRAVENOUS
  Administered 2023-08-05 – 2023-08-08 (×6): 2000 [IU]/h via INTRAVENOUS
  Filled 2023-08-03 (×9): qty 250

## 2023-08-03 MED ORDER — ATORVASTATIN CALCIUM 80 MG PO TABS
80.0000 mg | ORAL_TABLET | Freq: Every day | ORAL | Status: DC
  Administered 2023-08-03 – 2023-08-09 (×7): 80 mg via ORAL
  Filled 2023-08-03 (×7): qty 1

## 2023-08-03 MED ORDER — ACETAMINOPHEN 325 MG PO TABS
650.0000 mg | ORAL_TABLET | Freq: Four times a day (QID) | ORAL | Status: DC | PRN
Start: 2023-08-03 — End: 2023-08-05

## 2023-08-03 MED ORDER — PANTOPRAZOLE SODIUM 40 MG PO TBEC
40.0000 mg | DELAYED_RELEASE_TABLET | Freq: Every day | ORAL | Status: DC
Start: 2023-08-03 — End: 2023-08-10
  Administered 2023-08-03 – 2023-08-10 (×8): 40 mg via ORAL
  Filled 2023-08-03 (×8): qty 1

## 2023-08-03 MED ORDER — POLYETHYLENE GLYCOL 3350 17 G PO PACK
17.0000 g | PACK | Freq: Every day | ORAL | Status: DC | PRN
Start: 2023-08-03 — End: 2023-08-10

## 2023-08-03 MED ORDER — EZETIMIBE 10 MG PO TABS
10.0000 mg | ORAL_TABLET | Freq: Every day | ORAL | Status: DC
  Administered 2023-08-03 – 2023-08-10 (×8): 10 mg via ORAL
  Filled 2023-08-03 (×8): qty 1

## 2023-08-03 MED ORDER — CEFTRIAXONE SODIUM 1 G IJ SOLR
1.0000 g | Freq: Once | INTRAMUSCULAR | Status: AC
  Administered 2023-08-03: 1 g via INTRAVENOUS
  Filled 2023-08-03: qty 10

## 2023-08-03 MED ORDER — OXYCODONE HCL 5 MG PO TABS
5.0000 mg | ORAL_TABLET | ORAL | Status: DC | PRN
  Administered 2023-08-03 – 2023-08-05 (×9): 5 mg via ORAL
  Filled 2023-08-03 (×9): qty 1

## 2023-08-03 MED ORDER — FUROSEMIDE 10 MG/ML IJ SOLN
40.0000 mg | Freq: Two times a day (BID) | INTRAMUSCULAR | Status: AC
  Administered 2023-08-03 – 2023-08-04 (×3): 40 mg via INTRAVENOUS
  Filled 2023-08-03 (×3): qty 4

## 2023-08-03 MED ORDER — DIGOXIN 125 MCG PO TABS
0.1250 mg | ORAL_TABLET | Freq: Every day | ORAL | Status: DC
  Administered 2023-08-03 – 2023-08-10 (×8): 0.125 mg via ORAL
  Filled 2023-08-03 (×8): qty 1

## 2023-08-03 MED ORDER — IOPAMIDOL (ISOVUE-370) INJECTION 76%
75.0000 mL | Freq: Once | INTRAVENOUS | Status: AC | PRN
  Administered 2023-08-03: 75 mL via INTRAVENOUS

## 2023-08-03 MED ORDER — HEPARIN BOLUS VIA INFUSION
5000.0000 [IU] | Freq: Once | INTRAVENOUS | Status: AC
  Administered 2023-08-03: 5000 [IU] via INTRAVENOUS
  Filled 2023-08-03: qty 5000

## 2023-08-03 NOTE — ED Notes (Signed)
 Pt appears very uncomfortable and is diaphoretic.  Monitor keeps ringing VT, though does not appear to be.  Strips and EKG shown to EDP and admitting MD notified.

## 2023-08-03 NOTE — Progress Notes (Signed)
 PHARMACY - ANTICOAGULATION CONSULT NOTE  Pharmacy Consult for heparin    Indication: pulmonary embolus  Allergies  Allergen Reactions   Penicillins Other (See Comments)    Childhood allergy Unknown reaction  Did it involve swelling of the face/tongue/throat, SOB, or low BP? No Did it involve sudden or severe rash/hives, skin peeling, or any reaction on the inside of your mouth or nose? No Did you need to seek medical attention at a hospital or doctor's office? No When did it last happen?      child If all above answers are "NO", may proceed with cephalosporin use.    Patient Measurements: Height: 6\' 2"  (188 cm) Weight: 111.6 kg (246 lb) IBW/kg (Calculated) : 82.2 HEPARIN  DW (KG): 105.4  Vital Signs: Temp: 97.6 F (36.4 C) (05/17 2113) Temp Source: Oral (05/17 2113) BP: 124/97 (05/17 2113) Pulse Rate: 106 (05/17 2113)  Labs: Recent Labs    08/03/23 1025 08/03/23 1225 08/03/23 2156  HGB 16.5  --   --   HCT 48.2  --   --   PLT 202  --   --   HEPARINUNFRC  --   --  0.14*  CREATININE 1.17  --   --   TROPONINIHS 30* 29*  --     Estimated Creatinine Clearance: 108.2 mL/min (by C-G formula based on SCr of 1.17 mg/dL).   Medical History: Past Medical History:  Diagnosis Date   CAD S/P percutaneous coronary angioplasty 11/20/2013   a. 09/2009: EKG with Inf STEMI - no obstructive CAD;  b. 11/2013 Inflat STEMI/PCI: LM nl, LAD 20p, D1 sm - diff dzs, D2 large - nl, LCX 95-99, OM1 100 (3.5x38 Promus Premier DES), RCA 95-33m, 80d (3.0x20 and 3.0x16 Promus Premier DES') - normal EF;  c. NSTEMI 5/18 - patent stents, otw minimal CAD.- EF by Echo 35-40%; d. NSTEMI 10/2018 - ? culprit - Small branch of  D2,Med Rx,.  EF by Echo ~25%   Cardiomyopathy, ischemic 07/2016   h/o Inf STEMI 11/2013 - EF was "Normal"; b) NSTEMI 07/2016 (NO Cultprit on Cath) - Echo EF 35-40% (Basal Ant-Lat HK, basal-mid Inferolateral & apical Akinesis); c) NSTEMI 10/2018 -felt to be occluded small branch of D2, Echo  EF further decreased to 20-25%.    Essential hypertension    Hyperlipidemia with target LDL less than 100    Marijuana abuse    Migraine    Morbid obesity (HCC)    ST-segment elevation myocardial infarction (STEMI) of inferior wall (HCC) 11/20/2013   Assessment: Patient presenting with chest tightness, found to have PE w/o RHS. Hx of PE supposed to be taking Eliquis  however has not taken for approximately 1 month, pt reports wanting to trial off of medication. HgB 16.5 and PLTs 202. Pharmacy consulted to dose heparin .   5/17 PM update:  Heparin  level sub-therapeutic  Goal of Therapy:  Heparin  level 0.3-0.7 units/ml Monitor platelets by anticoagulation protocol: Yes   Plan:  Heparin  3000 units bolus Inc heparin  to 1900 units/hr Heparin  level in 8 hours  Silvestre Drum, PharmD, BCPS Clinical Pharmacist Phone: [phone]

## 2023-08-03 NOTE — ED Triage Notes (Signed)
 Pt reports getting dehydrated last Monday and also on fluid pills. Also feeling fluid in his chest and cough this week. Intermittent SOB. Reports that he is coughing so much that mucus and blood is coming out. Pt very diaphoretic.

## 2023-08-03 NOTE — H&P (Incomplete)
 History and Physical    Patient: Reginald Fernandez ZOX:[id number] DOB: 08-05-1980 DOA: 08/03/2023 DOS: the patient was seen and examined on 08/03/2023 PCP: Lawrance Presume, MD  Patient coming from: Home  Chief Complaint:  Chief Complaint  Patient presents with   Shortness of Breath   Cough   HPI: Reginald FEDERICI is a 43 y.o. male with medical history significant of CAD s/p PCI, HTN, HLD, and morbid obesity p/w SOB/DOE 2/2 RML PE/LLL PE as well as RLL CAP.  Pt states that he was in his USOH until last Monday when he increased his normal daily step count from 8k to 10k, afterwards he was more notable short of breath than usual. He did not make much of it until he coughed up blood yesterday evening at 1600; as such, he presented to the ED for further eval. Of note, pt reports stopping his Eliquis  BID for the past month after being on it w/o any issues for a year. He also endorses chest congestion that is only resolved with irritants such as tobacco vaping.  In the ED, pt was . His labs were notable for Cr 1.17, BNP 892, troponins 30-->29. CTA PE protocol demonstrated RML PE and LLL PE as well as RLL CAP. Pt admitted to medicine for ongoing care.  Review of Systems: As mentioned in the history of present illness. All other systems reviewed and are negative. Past Medical History:  Diagnosis Date   CAD S/P percutaneous coronary angioplasty 11/20/2013   a. 09/2009: EKG with Inf STEMI - no obstructive CAD;  b. 11/2013 Inflat STEMI/PCI: LM nl, LAD 20p, D1 sm - diff dzs, D2 large - nl, LCX 95-99, OM1 100 (3.5x38 Promus Premier DES), RCA 95-29m, 80d (3.0x20 and 3.0x16 Promus Premier DES') - normal EF;  c. NSTEMI 5/18 - patent stents, otw minimal CAD.- EF by Echo 35-40%; d. NSTEMI 10/2018 - ? culprit - Small branch of  D2,Med Rx,.  EF by Echo ~25%   Cardiomyopathy, ischemic 07/2016   h/o Inf STEMI 11/2013 - EF was "Normal"; b) NSTEMI 07/2016 (NO Cultprit on Cath) - Echo EF 35-40% (Basal Ant-Lat HK, basal-mid  Inferolateral & apical Akinesis); c) NSTEMI 10/2018 -felt to be occluded small branch of D2, Echo EF further decreased to 20-25%.    Essential hypertension    Hyperlipidemia with target LDL less than 100    Marijuana abuse    Migraine    Morbid obesity (HCC)    ST-segment elevation myocardial infarction (STEMI) of inferior wall (HCC) 11/20/2013   Past Surgical History:  Procedure Laterality Date   LEFT HEART CATH AND CORONARY ANGIOGRAPHY N/A 08/01/2016   Procedure: Left Heart Cath and Coronary Angiography;  Surgeon: Arnoldo Lapping, MD;  Location: [facility] INVASIVE CV LAB;;  Stable 2 V CAD - patent RCA & OM stents. Diffuse, non-obstructive CAD elsewhere. Moderately elevated LVEDP --> No culprit lesion  => restarted Plavix  & titrate Med Rx.   LEFT HEART CATH AND CORONARY ANGIOGRAPHY N/A 10/24/2018   Procedure: LEFT HEART CATH AND CORONARY ANGIOGRAPHY;  Surgeon: Millicent Ally, MD;  Location: [facility] INVASIVE CV LAB;  60% small D1, D2 has 3 branches 1 of which is small and appears to be 100%, LCx-OM widely patent with mild 55% stenosis in the jailed AV groove LCx. RCA has mild luminal irregularities 30% proximal with widely patent stents in the mid and distal RCA. (Culprit 100% small branch of D2)   LEFT HEART CATH AND CORONARY ANGIOGRAPHY  09/2009   (Presumably  in the setting of STEMI) mild RCA luminal Irregularities - MIld INf HK -- Med Rx; b)   LEFT HEART CATHETERIZATION WITH CORONARY ANGIOGRAM N/A 11/20/2013   Procedure: LEFT HEART CATHETERIZATION WITH CORONARY ANGIOGRAM;  Surgeon: Arleen Lacer, MD;  Location: [facility] CATH LAB;  Service: Cardiovascular;   Inf STEMI: OM1 100%, m-dRCA 95-99% thrombotic mRCA with tandem 80% -- PCI, LAD & Cx relatively normal.   PERCUTANEOUS CORONARY STENT INTERVENTION (PCI-S)  11/20/2013   a) PCI - OM1 Promus Premier DES 3.5 mm x 38 mm, m-d RCA Promus Premier DES  3.0 mm x 20 mm & 3.0 mm x 16 mm   RIGHT/LEFT HEART CATH AND CORONARY ANGIOGRAPHY N/A 10/11/2021   Procedure: RIGHT/LEFT  HEART CATH AND CORONARY ANGIOGRAPHY;  Surgeon: Darlis Eisenmenger, MD;  Location: [facility] INVASIVE CV LAB;  Service: Cardiovascular;  Laterality: N/A;   TRANSTHORACIC ECHOCARDIOGRAM  11/20/2013   LV EF 55-60%. No regional wall motion abnormality.  Normal valves    TRANSTHORACIC ECHOCARDIOGRAM  08/11/2016   (Non-STEMI-no culprit lesion): Moderately dilated LV.  Basal anterolateral HK with basal-mid inferolateral akinesis.  Mild-apical inferior akinesis.  EF 35 to 40%.  GRII DD.  Mild biatrial dilation.  Normal RV size and function. Mostly normal valves.   TRANSTHORACIC ECHOCARDIOGRAM  10/24/2018   (Non-STEMI-culprit lesion was 1 of 3 small 2 branches.)  EF 20-25%.  Mild mildly dilated LV.  GR 2 DD.  Mild biatrial dilation.  Normal PA pressures.   Social History:  reports that he quit smoking about 6 years ago. His smoking use included cigarettes. He has never used smokeless tobacco. He reports that he does not drink alcohol and does not use drugs.  Allergies  Allergen Reactions   Penicillins Other (See Comments)    Childhood allergy Unknown reaction  Did it involve swelling of the face/tongue/throat, SOB, or low BP? No Did it involve sudden or severe rash/hives, skin peeling, or any reaction on the inside of your mouth or nose? No Did you need to seek medical attention at a hospital or doctor's office? No When did it last happen?      child If all above answers are "NO", may proceed with cephalosporin use.    Family History  Problem Relation Age of Onset   Hypertension Mother    Heart attack Father 27       2 MIs by age 27 (first at 59)-- CABG   Hypertension Father    Heart failure Father    Hyperlipidemia Father    Heart attack Maternal Grandfather        82's   Heart attack Paternal Grandfather        59's   Heart attack Other     Prior to Admission medications   Medication Sig Start Date End Date Taking? Authorizing Provider  acetaminophen  (TYLENOL ) 325 MG tablet Take 2 tablets  (650 mg total) by mouth every 6 (six) hours as needed for mild pain (or Fever >/= 101). 06/07/22  Yes Gonfa, Taye T, MD  apixaban  (ELIQUIS ) 5 MG TABS tablet Take 1 tablet (5 mg total) by mouth 2 (two) times daily. Start after you finish starter pack Eliquis . 07/06/22   Gonfa, Taye T, MD  aspirin  EC 81 MG tablet Take 1 tablet (81 mg total) by mouth daily. 10/12/21  Yes Ruddy Corral M, PA-C  atorvastatin  (LIPITOR ) 80 MG tablet Take 1 tablet (80 mg total) by mouth daily at 6 PM 03/18/23  Yes Milford, Arlice Bene, FNP  carvedilol  (  COREG ) 3.125 MG tablet Take 3 tablets (9.375 mg total) by mouth 2 (two) times daily with a meal. 06/07/22  Yes Gonfa, Taye T, MD  dapagliflozin  propanediol (FARXIGA ) 10 MG TABS tablet Take 1 tablet (10 mg total) by mouth daily. 06/27/22  Yes Dulce Gibbs M, PA-C  digoxin  (LANOXIN ) 0.125 MG tablet Take 1 tablet (0.125 mg total) by mouth daily. 07/20/22  Yes Clegg, Amy D, NP  docusate sodium  (COLACE) 100 MG capsule Take 1 capsule (100 mg total) by mouth every 12 (twelve) hours. Patient taking differently: Take 100 mg by mouth as needed. 07/11/22  Yes Gretel Leaven, PA-C  ezetimibe  (ZETIA ) 10 MG tablet Take 1 tablet (10 mg total) by mouth daily. 03/18/23  Yes Milford, Arlice Bene, FNP  furosemide  (LASIX ) 20 MG tablet Take 1 tablet (20 mg total) by mouth daily. Patient not taking: Reported on 08/03/2023 10/17/21   Elmarie Hacking, FNP  nitroGLYCERIN  (NITROSTAT ) 0.4 MG SL tablet Place 1 tablet (0.4 mg total) under the tongue every 5 (five) minutes as needed for chest pain. 10/12/21  Yes Ruddy Corral M, PA-C  pantoprazole  (PROTONIX ) 40 MG tablet Take 1 tablet (40 mg total) by mouth daily. Patient taking differently: Take 40 mg by mouth daily. As needed 06/08/22  Yes Gonfa, Taye T, MD  sacubitril -valsartan  (ENTRESTO ) 24-26 MG Take 1 tablet by mouth 2 (two) times daily. 06/21/22  Yes Simmons, Brittainy M, PA-C  senna-docusate (SENOKOT-S) 8.6-50 MG tablet Take 1 tablet by mouth 2 (two)  times daily. Patient not taking: Reported on 08/03/2023 06/07/22   Gonfa, Taye T, MD  spironolactone  (ALDACTONE ) 25 MG tablet Take 1 tablet (25 mg total) by mouth daily. 10/17/21  Yes Milford, Arlice Bene, FNP  torsemide  (DEMADEX ) 20 MG tablet Take 1 tablet (20 mg total) by mouth 2 (two) times daily. 07/20/22 08/03/23 Yes Clegg, Amy D, NP  traMADol  (ULTRAM ) 50 MG tablet Take 1 tablet (50 mg total) by mouth every 6 (six) hours as needed. 07/11/22  Yes Gretel Leaven, PA-C    Physical Exam: Vitals:   08/03/23 1245 08/03/23 1330 08/03/23 1430 08/03/23 1445  BP: (!) 153/137   117/81  Pulse: (!) 45 (!) 45 (!) 50 (!) 46  Resp: 14 13 (!) 29 15  Temp:  98 F (36.7 C)    TempSrc:  Oral    SpO2: 100% 100% 99% 99%  Weight:      Height:       General: Alert, oriented x3, resting comfortably in no acute distress HEENT: EOMI, oropharynx clear, moist mucous membranes, hearing intact Neck: Trachea midline and no gross thyromegaly Respiratory: Lungs clear to auscultation bilaterally with normal respiratory effort; no w/r/r Cardiovascular: Regular rate and rhythm w/o m/r/g Abdomen: Soft, nontender, nondistended. Positive bowel sounds MSK: No obvious joint deformities or swelling Skin: No obvious rashes or lesions Neurologic: Awake, alert, spontaneously moves all extremities, strength intact Psychiatric: Appropriate mood and affect, conversational and cooperative  Data Reviewed: {Tip this will not be part of the note when signed- Document your independent interpretation of telemetry tracing, EKG, lab, Radiology test or any other diagnostic tests. Add any new diagnostic test ordered today. (Optional):[id number]} Lab Results  Component Value Date   WBC 13.4 (H) 08/03/2023   HGB 16.5 08/03/2023   HCT 48.2 08/03/2023   MCV 89.8 08/03/2023   PLT 202 08/03/2023   Lab Results  Component Value Date   GLUCOSE 191 (H) 08/03/2023   CALCIUM  8.9 08/03/2023   NA 136 08/03/2023   K 3.9  08/03/2023   CO2 25 08/03/2023    CL 99 08/03/2023   BUN 12 08/03/2023   CREATININE 1.17 08/03/2023   Lab Results  Component Value Date   ALT 49 (H) 06/21/2022   AST 34 06/21/2022   ALKPHOS 76 06/21/2022   BILITOT 2.3 (H) 06/21/2022   Lab Results  Component Value Date   INR 1.06 08/01/2016   INR 1.05 11/20/2013    Radiology: CT Angio Chest PE W/Cm &/Or Wo Cm Result Date: 08/03/2023 CLINICAL DATA:  Short of breath, cough EXAM: CT ANGIOGRAPHY CHEST WITH CONTRAST TECHNIQUE: Multidetector CT imaging of the chest was performed using the standard protocol during bolus administration of intravenous contrast. Multiplanar CT image reconstructions and MIPs were obtained to evaluate the vascular anatomy. RADIATION DOSE REDUCTION: This exam was performed according to the departmental dose-optimization program which includes automated exposure control, adjustment of the mA and/or kV according to patient size and/or use of iterative reconstruction technique. CONTRAST:  75mL ISOVUE -370 IOPAMIDOL  (ISOVUE -370) INJECTION 76% COMPARISON:  08/03/2023, 06/05/2022 FINDINGS: Cardiovascular: This is a technically adequate evaluation of the pulmonary vasculature. Segmental pulmonary emboli are seen within the right middle and left lower lobes, with minimal clot burden. No other filling defects. No right heart strain. The heart is enlarged without pericardial effusion. Normal caliber of the thoracic aorta. 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 Extensive atherosclerosis of the coronary vasculature. Mediastinum/Nodes: Borderline enlarged mediastinal lymph nodes, measuring up to 11 mm in the right paratracheal region. Thyroid, trachea, and esophagus are unremarkable. Lungs/Pleura: There is airspace disease within the lateral segment of the right middle lobe consistent with pneumonia. No effusion or pneumothorax. Central airways are patent. Upper Abdomen: No acute abnormality. Musculoskeletal: No acute or destructive  bony abnormalities. Reconstructed images demonstrate no additional findings. Review of the MIP images confirms the above findings. IMPRESSION: 1. Minimal segmental right middle and left lower lobe pulmonary emboli, with minimal clot burden and no right heart strain. 2. Right lower lobe airspace disease most consistent with pneumonia. 3. Cardiomegaly. 4. Borderline enlarged mediastinal lymph nodes, likely reactive. 5. Extensive coronary artery atherosclerosis. Critical Value/emergent results were called by telephone at the time of interpretation on 08/03/2023 at 2:21 pm to provider [facility] , who verbally acknowledged these results. Electronically Signed   By: Bobbye Burrow M.D.   On: 08/03/2023 14:21   DG Chest 2 View Result Date: 08/03/2023 CLINICAL DATA:  Shortness of breath, cough. EXAM: CHEST - 2 VIEW COMPARISON:  June 07, 2022. FINDINGS: Stable cardiomegaly. Both lungs are clear. The visualized skeletal structures are unremarkable. IMPRESSION: No active cardiopulmonary disease. Electronically Signed   By: Rosalene Colon M.D.   On: 08/03/2023 10:44    Assessment and Plan: 20M h/o CAD s/p PCI, HFrEF (EF <20% in 05/2022, G2DD, ?RHF), HTN, HLD, prior BLE DVT in 05/2022, and morbid obesity p/w SOB/DOE 2/2 RML PE/LLL PE as well as RLL CAP.  B/L PE RML PE LLL PE -Continue hep gtt for now -Consider discontinuing pta apixaban  in favor of warfarin therapy (Apixaban  may not be effective at this weight) -F/u BLE DVT US   RLL CAP -IV CTX 1g daily x 4 days to complete 5 day CAP course -PO azithromycin 500mg  daily x2 doses to complete 3 day CAP course -Duonebs prn -Wean O2 as tolerated -Ambulatory pulse ox prior to d/c  HFrEF -IV lasix  40mg  BID for now; goal net neg 1-2L/d; strict I/Os; daily standing weights; K>4/Mg>2 -PTA coreg  6.25mg  BID (pta  on 9.375mg  BID) -PTA digoxon; f/u digoxin  level -HOLD pta entresto  for now  HTN -Coreg  per above  Tobacco use -Start and continue mucinex 600mg   BID at discharge -Advised d/c vaping to prevent COPD which can worsen sputum thickness    Advance Care Planning:   Code Status: Full Code   Consults: N/A  Family Communication: N/A  Severity of Illness: The appropriate patient status for this patient is INPATIENT. Inpatient status is judged to be reasonable and necessary in order to provide the required intensity of service to ensure the patient's safety. The patient's presenting symptoms, physical exam findings, and initial radiographic and laboratory data in the context of their chronic comorbidities is felt to place them at high risk for further clinical deterioration. Furthermore, it is not anticipated that the patient will be medically stable for discharge from the hospital within 2 midnights of admission.   * I certify that at the point of admission it is my clinical judgment that the patient will require inpatient hospital care spanning beyond 2 midnights from the point of admission due to high intensity of service, high risk for further deterioration and high frequency of surveillance required.*   ------- I spent 55 minutes reviewing previous labs/notes, obtaining separate history at the bedside, counseling/discussing the treatment plan outlined above, ordering medications/tests, and performing clinical documentation.  Author: Arne Langdon, MD 08/03/2023 4:27 PM  For on call review [email or web address].

## 2023-08-03 NOTE — ED Provider Notes (Signed)
  Physical Exam  BP 117/81   Pulse (!) 46   Temp 98 F (36.7 C) (Oral)   Resp 15   Ht 6\' 2"  (1.88 m)   Wt 111.6 kg   SpO2 99%   BMI 31.58 kg/m   Physical Exam Vitals and nursing note reviewed.  HENT:     Head: Normocephalic and atraumatic.  Eyes:     Pupils: Pupils are equal, round, and reactive to light.  Cardiovascular:     Rate and Rhythm: Normal rate and regular rhythm.  Pulmonary:     Effort: Pulmonary effort is normal.     Breath sounds: Normal breath sounds.  Abdominal:     Palpations: Abdomen is soft.     Tenderness: There is no abdominal tenderness.  Skin:    General: Skin is warm and dry.  Neurological:     Mental Status: He is alert.  Psychiatric:        Mood and Affect: Mood normal.     Procedures  Procedures  ED Course / MDM   Clinical Course as of 08/03/23 1630  Sat Aug 03, 2023  1617 Discussed admitting hospitalist (Dr. Sulema Endo) who accepts patient for admission [MP]    Clinical Course User Index [MP] Sallyanne Creamer, DO   Medical Decision Making I, Rafael Bun DO, have assumed care of this patient from the previous provider admission for PE  Amount and/or Complexity of Data Reviewed Labs: ordered. Radiology: ordered.  Risk Prescription drug management. Decision regarding hospitalization.          Sallyanne Creamer, DO 08/03/23 1630

## 2023-08-03 NOTE — Progress Notes (Signed)
 PHARMACY - ANTICOAGULATION CONSULT NOTE  Pharmacy Consult for heparin    Indication: pulmonary embolus  Allergies  Allergen Reactions   Penicillins Other (See Comments)    Childhood allergy Unknown reaction  Did it involve swelling of the face/tongue/throat, SOB, or low BP? No Did it involve sudden or severe rash/hives, skin peeling, or any reaction on the inside of your mouth or nose? No Did you need to seek medical attention at a hospital or doctor's office? No When did it last happen?      child If all above answers are "NO", may proceed with cephalosporin use.    Patient Measurements: Height: 6\' 2"  (188 cm) Weight: 111.6 kg (246 lb) IBW/kg (Calculated) : 82.2 HEPARIN  DW (KG): 105.4  Vital Signs: Temp: 98 F (36.7 C) (05/17 1330) Temp Source: Oral (05/17 1330) BP: 117/81 (05/17 1445) Pulse Rate: 46 (05/17 1445)  Labs: Recent Labs    08/03/23 1025 08/03/23 1225  HGB 16.5  --   HCT 48.2  --   PLT 202  --   CREATININE 1.17  --   TROPONINIHS 30* 29*    Estimated Creatinine Clearance: 108.2 mL/min (by C-G formula based on SCr of 1.17 mg/dL).   Medical History: Past Medical History:  Diagnosis Date   CAD S/P percutaneous coronary angioplasty 11/20/2013   a. 09/2009: EKG with Inf STEMI - no obstructive CAD;  b. 11/2013 Inflat STEMI/PCI: LM nl, LAD 20p, D1 sm - diff dzs, D2 large - nl, LCX 95-99, OM1 100 (3.5x38 Promus Premier DES), RCA 95-68m, 80d (3.0x20 and 3.0x16 Promus Premier DES') - normal EF;  c. NSTEMI 5/18 - patent stents, otw minimal CAD.- EF by Echo 35-40%; d. NSTEMI 10/2018 - ? culprit - Small branch of  D2,Med Rx,.  EF by Echo ~25%   Cardiomyopathy, ischemic 07/2016   h/o Inf STEMI 11/2013 - EF was "Normal"; b) NSTEMI 07/2016 (NO Cultprit on Cath) - Echo EF 35-40% (Basal Ant-Lat HK, basal-mid Inferolateral & apical Akinesis); c) NSTEMI 10/2018 -felt to be occluded small branch of D2, Echo EF further decreased to 20-25%.    Essential hypertension     Hyperlipidemia with target LDL less than 100    Marijuana abuse    Migraine    Morbid obesity (HCC)    ST-segment elevation myocardial infarction (STEMI) of inferior wall (HCC) 11/20/2013   Assessment: Patient presenting with chest tightness, found to have PE w/o RHS. Hx of PE supposed to be taking Eliquis  however has not taken for approximately 1 month, pt reports wanting to trial off of medication. HgB 16.5 and PLTs 202. Pharmacy consulted to dose heparin .   Goal of Therapy:  Heparin  level 0.3-0.7 units/ml Monitor platelets by anticoagulation protocol: Yes   Plan:  Give 5000 units bolus x 1 Start heparin  infusion at 1700 units/hr Check anti-Xa level in 6 hours and daily while on heparin  Continue to monitor H&H and platelets  Mamie Searles, PharmD, BCCCP  08/03/2023,3:07 PM

## 2023-08-03 NOTE — ED Provider Notes (Signed)
 [place] EMERGENCY DEPARTMENT AT [facility] Provider Note   CSN: [id number] Arrival date & time: 08/03/23  1010     History  Chief Complaint  Patient presents with  . Shortness of Breath  . Cough    Reginald Fernandez is a 43 y.o. male.  HPI   43 year old male with past medical history of CAD/STEMI, previous PE supposed to be on Eliquis  but noncompliant presents to the emergency department with concern for chest tightness and cough.  Patient feels short of breath with exertion.  He states initially the cough was productive of clearish phlegm/fluid but now has become bloody, but without clots.  Patient states that he stopped taking his Eliquis  about a month ago because he wanted to trial and see how he felt without this medicine.  Otherwise he denies any fever, leg swelling, diarrhea.  He had 1 episode of emesis when phlegm got stuck in his throat.  Home Medications Prior to Admission medications   Medication Sig Start Date End Date Taking? Authorizing Provider  acetaminophen  (TYLENOL ) 325 MG tablet Take 2 tablets (650 mg total) by mouth every 6 (six) hours as needed for mild pain (or Fever >/= 101). 06/07/22   Gonfa, Taye T, MD  apixaban  (ELIQUIS ) 5 MG TABS tablet Take 1 tablet (5 mg total) by mouth 2 (two) times daily. Start after you finish starter pack Eliquis . 07/06/22   Gonfa, Taye T, MD  aspirin  EC 81 MG tablet Take 1 tablet (81 mg total) by mouth daily. 10/12/21   Ruddy Corral M, PA-C  atorvastatin  (LIPITOR ) 80 MG tablet Take 1 tablet (80 mg total) by mouth daily at 6 PM 03/18/23   Milford, Jessica M, FNP  carvedilol  (COREG ) 3.125 MG tablet Take 3 tablets (9.375 mg total) by mouth 2 (two) times daily with a meal. 06/07/22   Theadore Finger, MD  dapagliflozin  propanediol (FARXIGA ) 10 MG TABS tablet Take 1 tablet (10 mg total) by mouth daily. 06/27/22   Hassie Lint, PA-C  digoxin  (LANOXIN ) 0.125 MG tablet Take 1 tablet (0.125 mg total) by mouth daily. 07/20/22   Clegg,  Amy D, NP  docusate sodium  (COLACE) 100 MG capsule Take 1 capsule (100 mg total) by mouth every 12 (twelve) hours. Patient taking differently: Take 100 mg by mouth as needed. 07/11/22   Gretel Leaven, PA-C  ezetimibe  (ZETIA ) 10 MG tablet Take 1 tablet (10 mg total) by mouth daily. 03/18/23   Milford, Arlice Bene, FNP  furosemide  (LASIX ) 20 MG tablet Take 1 tablet (20 mg total) by mouth daily. 10/17/21   Milford, Arlice Bene, FNP  nitroGLYCERIN  (NITROSTAT ) 0.4 MG SL tablet Place 1 tablet (0.4 mg total) under the tongue every 5 (five) minutes as needed for chest pain. 10/12/21   Ruddy Corral M, PA-C  pantoprazole  (PROTONIX ) 40 MG tablet Take 1 tablet (40 mg total) by mouth daily. Patient taking differently: Take 40 mg by mouth daily. As needed 06/08/22   Gonfa, Taye T, MD  sacubitril -valsartan  (ENTRESTO ) 24-26 MG Take 1 tablet by mouth 2 (two) times daily. 06/21/22   Ruddy Corral M, PA-C  senna-docusate (SENOKOT-S) 8.6-50 MG tablet Take 1 tablet by mouth 2 (two) times daily. Patient taking differently: Take 1 tablet by mouth 2 (two) times daily. As needed 06/07/22   Gonfa, Taye T, MD  spironolactone  (ALDACTONE ) 25 MG tablet Take 1 tablet (25 mg total) by mouth daily. 10/17/21   Milford, Arlice Bene, FNP  torsemide  (DEMADEX ) 20 MG tablet Take 1 tablet (  20 mg total) by mouth 2 (two) times daily. 07/20/22 06/17/23  Clegg, Amy D, NP  traMADol  (ULTRAM ) 50 MG tablet Take 1 tablet (50 mg total) by mouth every 6 (six) hours as needed. 07/11/22   Gretel Leaven, PA-C      Allergies    Penicillins    Review of Systems   Review of Systems  Constitutional:  Positive for chills and fatigue. Negative for fever.  Respiratory:  Positive for cough, chest tightness and shortness of breath.        + Hemoptysis  Cardiovascular:  Negative for chest pain and leg swelling.  Gastrointestinal:  Positive for vomiting. Negative for abdominal pain, blood in stool and diarrhea.  Skin:  Negative for rash.  Neurological:  Negative  for headaches.    Physical Exam Updated Vital Signs BP (!) 133/95   Pulse 94   Temp 98.1 F (36.7 C) (Oral)   Resp 14   Ht 6\' 2"  (1.88 m)   Wt 111.6 kg   SpO2 98%   BMI 31.58 kg/m  Physical Exam Vitals and nursing note reviewed.  Constitutional:      General: He is not in acute distress.    Appearance: Normal appearance.  HENT:     Head: Normocephalic.     Mouth/Throat:     Mouth: Mucous membranes are moist.  Cardiovascular:     Rate and Rhythm: Tachycardia present.  Pulmonary:     Effort: Pulmonary effort is normal. No respiratory distress.     Breath sounds: Examination of the right-lower field reveals decreased breath sounds. Examination of the left-lower field reveals decreased breath sounds. Decreased breath sounds present.  Abdominal:     Palpations: Abdomen is soft.     Tenderness: There is no abdominal tenderness.  Musculoskeletal:     Right lower leg: No edema.     Left lower leg: No edema.  Skin:    General: Skin is warm.  Neurological:     Mental Status: He is alert and oriented to person, place, and time. Mental status is at baseline.  Psychiatric:        Mood and Affect: Mood normal.    ED Results / Procedures / Treatments   Labs (all labs ordered are listed, but only abnormal results are displayed) Labs Reviewed  BASIC METABOLIC PANEL WITH GFR - Abnormal; Notable for the following components:      Result Value   Glucose, Bld 191 (*)    All other components within normal limits  CBC - Abnormal; Notable for the following components:   WBC 13.4 (*)    All other components within normal limits  BRAIN NATRIURETIC PEPTIDE - Abnormal; Notable for the following components:   B Natriuretic Peptide 892.8 (*)    All other components within normal limits  TROPONIN I (HIGH SENSITIVITY) - Abnormal; Notable for the following components:   Troponin I (High Sensitivity) 30 (*)    All other components within normal limits  TROPONIN I (HIGH SENSITIVITY)     EKG EKG Interpretation Date/Time:  Saturday Aug 03 2023 10:21:03 EDT Ventricular Rate:  104 PR Interval:  174 QRS Duration:  100 QT Interval:  374 QTC Calculation: 491 R Axis:   154  Text Interpretation: Undetermined rhythm Incomplete right bundle branch block Right ventricular hypertrophy Abnormal ECG When compared with ECG of 19-Jul-2022 10:15, PREVIOUS ECG IS PRESENT Confirmed by Florentino Hurdle [phone]) on 08/03/2023 10:44:23 AM  Radiology DG Chest 2 View Result Date: 08/03/2023 CLINICAL DATA:  Shortness of breath, cough. EXAM: CHEST - 2 VIEW COMPARISON:  June 07, 2022. FINDINGS: Stable cardiomegaly. Both lungs are clear. The visualized skeletal structures are unremarkable. IMPRESSION: No active cardiopulmonary disease. Electronically Signed   By: Rosalene Colon M.D.   On: 08/03/2023 10:44    Procedures .Critical Care  Performed by: Flonnie Humphrey, DO Authorized by: Flonnie Humphrey, DO   Critical care provider statement:    Critical care time (minutes):  30   Critical care time was exclusive of:  Separately billable procedures and treating other patients   Critical care was necessary to treat or prevent imminent or life-threatening deterioration of the following conditions:  Respiratory failure   Critical care was time spent personally by me on the following activities:  Development of treatment plan with patient or surrogate, discussions with consultants, evaluation of patient's response to treatment, examination of patient, ordering and review of laboratory studies, ordering and review of radiographic studies, ordering and performing treatments and interventions, pulse oximetry, re-evaluation of patient's condition and review of old charts   I assumed direction of critical care for this patient from another provider in my specialty: no     Care discussed with: admitting provider       Medications Ordered in ED Medications - No data to display  ED Course/ Medical  Decision Making/ A&P                                 Medical Decision Making Amount and/or Complexity of Data Reviewed Labs: ordered. Radiology: ordered.  Risk Prescription drug management. Decision regarding hospitalization.   43 year old male presents the emergency department with productive cough of phlegm and blood, shortness of breath.  He is post of anticoag on Eliquis  for previous PE but is been noncompliant at least for the last month.    Patient is tachycardic on my arrival, at times tachypneic but normal oxygenation.  EKG does not show any acute ischemic changes.  Chest x-ray read as normal.  Blood work shows a mild leukocytosis.  Troponin is slightly elevated but flat, BNP is slightly elevated but also within the patient's baseline.  Concern being for noncompliance with Eliquis  and hemoptysis.  CT PE study confirms pulmonary emboli on the right with no evidence of right heart strain but also evidence of pneumonia.  On reevaluation the patient continues to be tachycardic at rest, tachypneic.  Given his history of cardiac disease, heart failure with low EF with PE is complicated by pneumonia and the fact that he is still symptomatic I will plan for IV therapy and admission.  Patients evaluation and results requires admission for further treatment and care.  Spoke with hospitalist, reviewed patient's ED course and they accept admission.  Patient agrees with admission plan, offers no new complaints and is stable/unchanged at time of admit.         Final Clinical Impression(s) / ED Diagnoses Final diagnoses:  None    Rx / DC Orders ED Discharge Orders     None         Flonnie Humphrey, DO 08/03/23 1520

## 2023-08-04 ENCOUNTER — Inpatient Hospital Stay (HOSPITAL_COMMUNITY)

## 2023-08-04 ENCOUNTER — Other Ambulatory Visit (HOSPITAL_COMMUNITY)

## 2023-08-04 DIAGNOSIS — I2693 Single subsegmental pulmonary embolism without acute cor pulmonale: Secondary | ICD-10-CM | POA: Diagnosis not present

## 2023-08-04 DIAGNOSIS — M7989 Other specified soft tissue disorders: Secondary | ICD-10-CM | POA: Diagnosis not present

## 2023-08-04 DIAGNOSIS — J189 Pneumonia, unspecified organism: Secondary | ICD-10-CM | POA: Diagnosis not present

## 2023-08-04 LAB — BASIC METABOLIC PANEL WITH GFR
Anion gap: 13 (ref 5–15)
BUN: 12 mg/dL (ref 6–20)
CO2: 28 mmol/L (ref 22–32)
Calcium: 9.3 mg/dL (ref 8.9–10.3)
Chloride: 96 mmol/L — ABNORMAL LOW (ref 98–111)
Creatinine, Ser: 1.21 mg/dL (ref 0.61–1.24)
GFR, Estimated: >60 mL/min (ref >60)
Glucose, Bld: 117 mg/dL — ABNORMAL HIGH (ref 70–99)
Potassium: 4.1 mmol/L (ref 3.5–5.1)
Sodium: 137 mmol/L (ref 135–145)

## 2023-08-04 LAB — CBC
HCT: 48.4 % (ref 39.0–52.0)
Hemoglobin: 16.2 g/dL (ref 13.0–17.0)
MCH: 30.2 pg (ref 26.0–34.0)
MCHC: 33.5 g/dL (ref 30.0–36.0)
MCV: 90.1 fL (ref 80.0–100.0)
Platelets: 198 10*3/uL (ref 150–400)
RBC: 5.37 MIL/uL (ref 4.22–5.81)
RDW: 13.5 % (ref 11.5–15.5)
WBC: 15.3 10*3/uL — ABNORMAL HIGH (ref 4.0–10.5)
nRBC: 0 % (ref 0.0–0.2)

## 2023-08-04 LAB — ECHOCARDIOGRAM COMPLETE
Est EF: <20
Height: 74 in
S' Lateral: 5.9 cm
Weight: 3936 [oz_av]

## 2023-08-04 LAB — HEPARIN LEVEL (UNFRACTIONATED)
Heparin Unfractionated: 0.43 [IU]/mL (ref 0.30–0.70)
Heparin Unfractionated: 0.3 [IU]/mL (ref 0.30–0.70)

## 2023-08-04 MED ORDER — MORPHINE SULFATE (PF) 2 MG/ML IV SOLN
2.0000 mg | INTRAVENOUS | Status: DC | PRN
  Administered 2023-08-04 – 2023-08-05 (×3): 2 mg via INTRAVENOUS
  Filled 2023-08-04 (×4): qty 1

## 2023-08-04 MED ORDER — FUROSEMIDE 10 MG/ML IJ SOLN: 40.0000 mg | Freq: Two times a day (BID) | INTRAMUSCULAR | Status: DC

## 2023-08-04 MED ORDER — DAPAGLIFLOZIN PROPANEDIOL 10 MG PO TABS
10.0000 mg | ORAL_TABLET | Freq: Every day | ORAL | Status: DC
  Administered 2023-08-05 – 2023-08-10 (×6): 10 mg via ORAL
  Filled 2023-08-04 (×6): qty 1

## 2023-08-04 MED ORDER — FUROSEMIDE 10 MG/ML IJ SOLN
40.0000 mg | Freq: Two times a day (BID) | INTRAMUSCULAR | Status: DC
  Administered 2023-08-05: 40 mg via INTRAVENOUS
  Filled 2023-08-04: qty 4

## 2023-08-04 MED ORDER — PERFLUTREN LIPID MICROSPHERE
1.0000 mL | INTRAVENOUS | Status: AC | PRN
  Administered 2023-08-04: 3 mL via INTRAVENOUS

## 2023-08-04 MED ORDER — SACUBITRIL-VALSARTAN 24-26 MG PO TABS
1.0000 | ORAL_TABLET | Freq: Two times a day (BID) | ORAL | Status: DC
  Filled 2023-08-04: qty 1

## 2023-08-04 MED ORDER — HYDROMORPHONE HCL 1 MG/ML IJ SOLN
0.5000 mg | INTRAMUSCULAR | Status: AC
  Administered 2023-08-04: 0.5 mg via INTRAVENOUS
  Filled 2023-08-04: qty 1

## 2023-08-04 NOTE — Progress Notes (Addendum)
 [name]  ZOX:[id number] DOB: [birth year] DOA: 08/03/2023 PCP: Lawrance Presume, MD  Chief Complaint  Patient presents with   Shortness of Breath   Cough    Brief Narrative:   Reginald Fernandez is Reginald Fernandez 43 y.o. male with medical history significant of CAD s/p PCI, HTN, HLD, and morbid obesity p/w SOB/DOE 2/2 RML PE/LLL PE as well as RLL CAP.   Assessment & Plan:   Principal Problem:   Pulmonary emboli (HCC)  Acute Pulmonary Embolism CT with minimal segmental R middle and LLL pulmonary emboli with minimal clot burden LE US  pending Echo with EF <20%, RVSF severely reduced (this appears similar to previous echo 05/2022) Heparin  gtt  Community Acquired Pneumonia Continue abx   HFrEF Not grossly overloaded, but c/o orthopnea Continue IV lasix  for now Echo as noted above Will make cards aware of admission  Digoxin , coreg  Resume entresto  and farxiga  tmrw  CAD ASA, statin  Tobacco Abuse Encourage cessation    DVT prophylaxis: heparin  gtt Code Status: full Family Communication: none Disposition:   Status is: Inpatient Remains inpatient appropriate because: need for ongoing inpatient    Consultants:  none  Procedures:  Echo IMPRESSIONS     1. Left ventricular ejection fraction, by estimation, is <20%. The left  ventricle has severely decreased function. The left ventricle demonstrates  global hypokinesis. The left ventricular internal cavity size was severely  dilated. Left ventricular  diastolic parameters are indeterminate.   2. Right ventricular systolic function is severely reduced. The right  ventricular size is severely enlarged. There is mildly elevated pulmonary  artery systolic pressure. The estimated right ventricular systolic  pressure is 40.4 mmHg.   3. Left atrial size was severely dilated.   4. Right atrial size was severely dilated.   5. The mitral valve is normal in structure. Trivial mitral valve  regurgitation. No  evidence of mitral stenosis.   6. The aortic valve is tricuspid. There is mild calcification of the  aortic valve. Aortic valve regurgitation is not visualized. No aortic  stenosis is present.   7. The inferior vena cava is dilated in size with <50% respiratory  variability, suggesting right atrial pressure of 15 mmHg.     Antimicrobials:  Anti-infectives (From admission, onward)    Start     Dose/Rate Route Frequency Ordered Stop   08/04/23 1000  azithromycin (ZITHROMAX) tablet 500 mg        500 mg Oral Daily 08/03/23 1614 08/06/23 0959   08/04/23 0600  cefTRIAXone  (ROCEPHIN ) 1 g in sodium chloride  0.9 % 100 mL IVPB        1 g 200 mL/hr over 30 Minutes Intravenous Every 24 hours 08/03/23 1614 08/08/23 0559   08/03/23 1515  cefTRIAXone  (ROCEPHIN ) 1 g in sodium chloride  0.9 % 100 mL IVPB        1 g 200 mL/hr over 30 Minutes Intravenous  Once 08/03/23 1505 08/03/23 1646   08/03/23 1515  azithromycin (ZITHROMAX) 500 mg in sodium chloride  0.9 % 250 mL IVPB        500 mg 250 mL/hr over 60 Minutes Intravenous  Once 08/03/23 1505 08/03/23 2106       Subjective: C/o pain, SOB when lying back   Objective: Vitals:   08/04/23 0025 08/04/23 0414 08/04/23 0806 08/04/23 1236  BP: 108/62 121/84 (!) 113/95 106/75  Pulse:  92 83 (!) 44  Resp: 18 18 19 19   Temp:  98.1 F (36.7 C) 97.7  F (36.5 C) 97.8 F (36.6 C)  TempSrc:  Oral Oral Oral  SpO2: 100% 95% 100% 90%  Weight:      Height:        Intake/Output Summary (Last 24 hours) at 08/04/2023 1704 Last data filed at 08/04/2023 1557 Gross per 24 hour  Intake 567.13 ml  Output --  Net 567.13 ml   Filed Weights   08/03/23 1018  Weight: 111.6 kg    Examination:  General exam: Appears uncomfortable Respiratory system: tachypneic, increased wob - diminished Cardiovascular system: RRR Gastrointestinal system: Abdomen is nondistended, soft and nontender Central nervous system: Alert and oriented. No focal neurological  deficits. Extremities: no LEE    Data Reviewed: I have personally reviewed following labs and imaging studies  CBC: Recent Labs  Lab 08/03/23 1025 08/04/23 0850  WBC 13.4* 15.3*  HGB 16.5 16.2  HCT 48.2 48.4  MCV 89.8 90.1  PLT 202 198    Basic Metabolic Panel: Recent Labs  Lab 08/03/23 1025  NA 136  K 3.9  CL 99  CO2 25  GLUCOSE 191*  BUN 12  CREATININE 1.17  CALCIUM  8.9    GFR: Estimated Creatinine Clearance: 108.2 mL/min (by C-G formula based on SCr of 1.17 mg/dL).  Liver Function Tests: No results for input(s): "AST", "ALT", "ALKPHOS", "BILITOT", "PROT", "ALBUMIN" in the last 168 hours.  CBG: No results for input(s): "GLUCAP" in the last 168 hours.   No results found for this or any previous visit (from the past 240 hours).       Radiology Studies: ECHOCARDIOGRAM COMPLETE Result Date: 08/04/2023    ECHOCARDIOGRAM REPORT   Patient Name:   Reginald Fernandez Date of Exam: 08/04/2023 Medical Rec #:  [id number]      Height:       74.0 in Accession #:    [id number]     Weight:       246.0 lb Date of Birth:  09-Oct-1980      BSA:          2.373 m Patient Age:    43 years       BP:           106/75 mmHg Patient Gender: M              HR:           96 bpm. Exam Location:  Inpatient Procedure: 2D Echo, Cardiac Doppler, Color Doppler and Intracardiac            Opacification Agent (Both Spectral and Color Flow Doppler were            utilized during procedure). Indications:    Pulmonary Embolus  History:        Patient has prior history of Echocardiogram examinations, most                 recent 06/04/2022. Risk Factors:Hypertension.  Sonographer:    Janette Medley Referring Phys: [phone] Chanique Duca CALDWELL POWELL JR IMPRESSIONS  1. Left ventricular ejection fraction, by estimation, is <20%. The left ventricle has severely decreased function. The left ventricle demonstrates global hypokinesis. The left ventricular internal cavity size was severely dilated. Left ventricular diastolic  parameters are indeterminate.  2. Right ventricular systolic function is severely reduced. The right ventricular size is severely enlarged. There is mildly elevated pulmonary artery systolic pressure. The estimated right ventricular systolic pressure is 40.4 mmHg.  3. Left atrial size was severely dilated.  4. Right atrial size was severely dilated.  5. The mitral valve is normal in structure. Trivial mitral valve regurgitation. No evidence of mitral stenosis.  6. The aortic valve is tricuspid. There is mild calcification of the aortic valve. Aortic valve regurgitation is not visualized. No aortic stenosis is present.  7. The inferior vena cava is dilated in size with <50% respiratory variability, suggesting right atrial pressure of 15 mmHg. FINDINGS  Left Ventricle: Left ventricular ejection fraction, by estimation, is <20%. The left ventricle has severely decreased function. The left ventricle demonstrates global hypokinesis. The left ventricular internal cavity size was severely dilated. There is no left ventricular hypertrophy. Left ventricular diastolic parameters are indeterminate. Right Ventricle: The right ventricular size is severely enlarged. No increase in right ventricular wall thickness. Right ventricular systolic function is severely reduced. There is mildly elevated pulmonary artery systolic pressure. The tricuspid regurgitant velocity is 2.52 m/s, and with an assumed right atrial pressure of 15 mmHg, the estimated right ventricular systolic pressure is 40.4 mmHg. Left Atrium: Left atrial size was severely dilated. Right Atrium: Right atrial size was severely dilated. Pericardium: There is no evidence of pericardial effusion. Mitral Valve: The mitral valve is normal in structure. Trivial mitral valve regurgitation. No evidence of mitral valve stenosis. Tricuspid Valve: The tricuspid valve is normal in structure. Tricuspid valve regurgitation is trivial. No evidence of tricuspid stenosis. Aortic Valve:  The aortic valve is tricuspid. There is mild calcification of the aortic valve. Aortic valve regurgitation is not visualized. No aortic stenosis is present. Pulmonic Valve: The pulmonic valve was normal in structure. Pulmonic valve regurgitation is trivial. No evidence of pulmonic stenosis. Aorta: The aortic root is normal in size and structure. Venous: The inferior vena cava is dilated in size with less than 50% respiratory variability, suggesting right atrial pressure of 15 mmHg. IAS/Shunts: No atrial level shunt detected by color flow Doppler.  LEFT VENTRICLE PLAX 2D LVIDd:         6.60 cm   Diastology LVIDs:         5.90 cm   LV e' lateral: 7.77 cm/s LV PW:         1.10 cm LV IVS:        1.10 cm LVOT diam:     2.10 cm LV SV:         50 LV SV Index:   21 LVOT Area:     3.46 cm  RIGHT VENTRICLE             IVC RV S prime:     11.00 cm/s  IVC diam: 2.80 cm TAPSE (M-mode): 2.8 cm LEFT ATRIUM              Index        RIGHT ATRIUM           Index LA diam:        4.20 cm  1.77 cm/m   RA Area:     28.70 cm LA Vol (A2C):   113.0 ml 47.61 ml/m  RA Volume:   98.50 ml  41.50 ml/m LA Vol (A4C):   106.0 ml 44.66 ml/m LA Biplane Vol: 114.0 ml 48.03 ml/m  AORTIC VALVE LVOT Vmax:   101.00 cm/s LVOT Vmean:  63.700 cm/s LVOT VTI:    0.145 m  AORTA Ao Root diam: 3.20 cm Ao Asc diam:  3.50 cm TRICUSPID VALVE TR Peak grad:   25.4 mmHg TR Vmax:        252.00 cm/s  SHUNTS Systemic VTI:  0.14 m  Systemic Diam: 2.10 cm Jules Oar MD Electronically signed by Jules Oar MD Signature Date/Time: 08/04/2023/3:42:15 PM    Final    CT Angio Chest PE W/Cm &/Or Wo Cm Result Date: 08/03/2023 CLINICAL DATA:  Short of breath, cough EXAM: CT ANGIOGRAPHY CHEST WITH CONTRAST TECHNIQUE: Multidetector CT imaging of the chest was performed using the standard protocol during bolus administration of intravenous contrast. Multiplanar CT image reconstructions and MIPs were obtained to evaluate the vascular anatomy. RADIATION DOSE  REDUCTION: This exam was performed according to the departmental dose-optimization program which includes automated exposure control, adjustment of the mA and/or kV according to patient size and/or use of iterative reconstruction technique. CONTRAST:  75mL ISOVUE -370 IOPAMIDOL  (ISOVUE -370) INJECTION 76% COMPARISON:  08/03/2023, 06/05/2022 FINDINGS: Cardiovascular: This is Chrishawn Kring technically adequate evaluation of the pulmonary vasculature. Segmental pulmonary emboli are seen within the right middle and left lower lobes, with minimal clot burden. No other filling defects. No right heart strain. The heart is enlarged without pericardial effusion. Normal caliber of the thoracic aorta. 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 Extensive atherosclerosis of the coronary vasculature. Mediastinum/Nodes: Borderline enlarged mediastinal lymph nodes, measuring up to 11 mm in the right paratracheal region. Thyroid, trachea, and esophagus are unremarkable. Lungs/Pleura: There is airspace disease within the lateral segment of the right middle lobe consistent with pneumonia. No effusion or pneumothorax. Central airways are patent. Upper Abdomen: No acute abnormality. Musculoskeletal: No acute or destructive bony abnormalities. Reconstructed images demonstrate no additional findings. Review of the MIP images confirms the above findings. IMPRESSION: 1. Minimal segmental right middle and left lower lobe pulmonary emboli, with minimal clot burden and no right heart strain. 2. Right lower lobe airspace disease most consistent with pneumonia. 3. Cardiomegaly. 4. Borderline enlarged mediastinal lymph nodes, likely reactive. 5. Extensive coronary artery atherosclerosis. Critical Value/emergent results were called by telephone at the time of interpretation on 08/03/2023 at 2:21 pm to provider [facility] , who verbally acknowledged these results. Electronically Signed   By: Bobbye Burrow M.D.   On:  08/03/2023 14:21   DG Chest 2 View Result Date: 08/03/2023 CLINICAL DATA:  Shortness of breath, cough. EXAM: CHEST - 2 VIEW COMPARISON:  June 07, 2022. FINDINGS: Stable cardiomegaly. Both lungs are clear. The visualized skeletal structures are unremarkable. IMPRESSION: No active cardiopulmonary disease. Electronically Signed   By: Rosalene Colon M.D.   On: 08/03/2023 10:44        Scheduled Meds:  aspirin  EC  81 mg Oral Daily   atorvastatin   80 mg Oral q1800   azithromycin  500 mg Oral Daily   carvedilol   6.25 mg Oral BID WC   digoxin   0.125 mg Oral Daily   ezetimibe   10 mg Oral Daily   guaiFENesin  600 mg Oral BID   pantoprazole   40 mg Oral Daily   Continuous Infusions:  cefTRIAXone  (ROCEPHIN )  IV Stopped (08/04/23 0553)   heparin  1,900 Units/hr (08/04/23 1557)     LOS: 1 day    Time spent: over 30 min    Donnetta Gains, MD [facility]   To contact the attending provider between 7A-7P or the covering provider during after hours 7P-7A, please log into the web site [email or web address] and access using universal [place] password for that web site. If you do not have the password, please call the hospital operator.  08/04/2023, 5:04 PM

## 2023-08-04 NOTE — Plan of Care (Signed)

## 2023-08-04 NOTE — Progress Notes (Signed)
 PHARMACY - ANTICOAGULATION CONSULT NOTE  Pharmacy Consult for heparin    Indication: pulmonary embolus  Allergies  Allergen Reactions   Penicillins Other (See Comments)    Childhood allergy Unknown reaction  Did it involve swelling of the face/tongue/throat, SOB, or low BP? No Did it involve sudden or severe rash/hives, skin peeling, or any reaction on the inside of your mouth or nose? No Did you need to seek medical attention at a hospital or doctor's office? No When did it last happen?      child If all above answers are "NO", may proceed with cephalosporin use.    Patient Measurements: Height: 6\' 2"  (188 cm) Weight: 111.6 kg (246 lb) IBW/kg (Calculated) : 82.2 HEPARIN  DW (KG): 105.4  Vital Signs: Temp: 97.7 F (36.5 C) (05/18 0806) Temp Source: Oral (05/18 0806) BP: 113/95 (05/18 0806) Pulse Rate: 83 (05/18 0806)  Labs: Recent Labs    08/03/23 1025 08/03/23 1225 08/03/23 2156 08/04/23 0850  HGB 16.5  --   --  16.2  HCT 48.2  --   --  48.4  PLT 202  --   --  198  HEPARINUNFRC  --   --  0.14* 0.43  CREATININE 1.17  --   --   --   TROPONINIHS 30* 29*  --   --     Estimated Creatinine Clearance: 108.2 mL/min (by C-G formula based on SCr of 1.17 mg/dL).   Medical History: Past Medical History:  Diagnosis Date   CAD S/P percutaneous coronary angioplasty 11/20/2013   a. 09/2009: EKG with Inf STEMI - no obstructive CAD;  b. 11/2013 Inflat STEMI/PCI: LM nl, LAD 20p, D1 sm - diff dzs, D2 large - nl, LCX 95-99, OM1 100 (3.5x38 Promus Premier DES), RCA 95-63m, 80d (3.0x20 and 3.0x16 Promus Premier DES') - normal EF;  c. NSTEMI 5/18 - patent stents, otw minimal CAD.- EF by Echo 35-40%; d. NSTEMI 10/2018 - ? culprit - Small branch of  D2,Med Rx,.  EF by Echo ~25%   Cardiomyopathy, ischemic 07/2016   h/o Inf STEMI 11/2013 - EF was "Normal"; b) NSTEMI 07/2016 (NO Cultprit on Cath) - Echo EF 35-40% (Basal Ant-Lat HK, basal-mid Inferolateral & apical Akinesis); c) NSTEMI 10/2018  -felt to be occluded small branch of D2, Echo EF further decreased to 20-25%.    Essential hypertension    Hyperlipidemia with target LDL less than 100    Marijuana abuse    Migraine    Morbid obesity (HCC)    ST-segment elevation myocardial infarction (STEMI) of inferior wall (HCC) 11/20/2013   Assessment: Patient presenting with chest tightness, found to have PE w/o RHS. Hx of PE supposed to be taking Eliquis  however has not taken for approximately 1 month, pt reports wanting to trial off of medication. Pharmacy consulted to dose heparin .   Heparin  level came back this morning therapeutic at 0.43, on heparin  infusion at 1900 units/hr. Hgb 16.2, plt 198. No s/sx of bleeding or infusion issues.   Goal of Therapy:  Heparin  level 0.3-0.7 units/ml Monitor platelets by anticoagulation protocol: Yes   Plan:  Continue heparin  infusion at 1900 units/hr Will confirm heparin  level in 8 hours Monitor daily HL, CBC, and for s/sx of bleeding   Thank you for allowing pharmacy to participate in this patient's care,  Nieves Bars, PharmD, BCCCP Clinical Pharmacist  Phone: [phone] 08/04/2023 9:38 AM  Please check AMION for all [facility] Pharmacy phone numbers After 10:00 PM, call Main Pharmacy [phone]

## 2023-08-04 NOTE — Progress Notes (Signed)
 PHARMACY - ANTICOAGULATION CONSULT NOTE  Pharmacy Consult for heparin    Indication: pulmonary embolus  Allergies  Allergen Reactions   Penicillins Other (See Comments)    Childhood allergy Unknown reaction  Did it involve swelling of the face/tongue/throat, SOB, or low BP? No Did it involve sudden or severe rash/hives, skin peeling, or any reaction on the inside of your mouth or nose? No Did you need to seek medical attention at a hospital or doctor's office? No When did it last happen?      child If all above answers are "NO", may proceed with cephalosporin use.    Patient Measurements: Height: 6\' 2"  (188 cm) Weight: 111.6 kg (246 lb) IBW/kg (Calculated) : 82.2 HEPARIN  DW (KG): 105.4  Vital Signs: Temp: 97.8 F (36.6 C) (05/18 1236) Temp Source: Oral (05/18 1236) BP: 106/75 (05/18 1236) Pulse Rate: 44 (05/18 1236)  Labs: Recent Labs    08/03/23 1025 08/03/23 1225 08/03/23 2156 08/04/23 0850 08/04/23 1802  HGB 16.5  --   --  16.2  --   HCT 48.2  --   --  48.4  --   PLT 202  --   --  198  --   HEPARINUNFRC  --   --  0.14* 0.43 0.30  CREATININE 1.17  --   --   --  1.21  TROPONINIHS 30* 29*  --   --   --     Estimated Creatinine Clearance: 104.7 mL/min (by C-G formula based on SCr of 1.21 mg/dL).   Medical History: Past Medical History:  Diagnosis Date   CAD S/P percutaneous coronary angioplasty 11/20/2013   a. 09/2009: EKG with Inf STEMI - no obstructive CAD;  b. 11/2013 Inflat STEMI/PCI: LM nl, LAD 20p, D1 sm - diff dzs, D2 large - nl, LCX 95-99, OM1 100 (3.5x38 Promus Premier DES), RCA 95-60m, 80d (3.0x20 and 3.0x16 Promus Premier DES') - normal EF;  c. NSTEMI 5/18 - patent stents, otw minimal CAD.- EF by Echo 35-40%; d. NSTEMI 10/2018 - ? culprit - Small branch of  D2,Med Rx,.  EF by Echo ~25%   Cardiomyopathy, ischemic 07/2016   h/o Inf STEMI 11/2013 - EF was "Normal"; b) NSTEMI 07/2016 (NO Cultprit on Cath) - Echo EF 35-40% (Basal Ant-Lat HK, basal-mid  Inferolateral & apical Akinesis); c) NSTEMI 10/2018 -felt to be occluded small branch of D2, Echo EF further decreased to 20-25%.    Essential hypertension    Hyperlipidemia with target LDL less than 100    Marijuana abuse    Migraine    Morbid obesity (HCC)    ST-segment elevation myocardial infarction (STEMI) of inferior wall (HCC) 11/20/2013   Assessment: Patient presenting with chest tightness, found to have PE w/o RHS. Hx of PE supposed to be taking Eliquis  however has not taken for approximately 1 month, pt reports wanting to trial off of medication. Pharmacy consulted to dose heparin .   Heparin  level came back this morning therapeutic at 0.43, on heparin  infusion at 1900 units/hr. Hgb 16.2, plt 198. No s/sx of bleeding or infusion issues.   5/18 PM - heparin  level on lower end of therapeutic range. No issues / interruptions.  Goal of Therapy:  Heparin  level 0.3-0.7 units/ml Monitor platelets by anticoagulation protocol: Yes   Plan:  Increase heparin  infusion to 2000 units/hr to keep in mid-therapeutic range  F/u confirmatory level with AM labs Monitor daily HL, CBC, and for s/sx of bleeding   Thank you for allowing pharmacy to participate in this  patient's care,  Cecillia Cogan, PharmD Clinical Pharmacist 08/04/2023  7:04 PM

## 2023-08-04 NOTE — Progress Notes (Signed)
 BLE venous duplex has been completed.  Preliminary findings given to Dr. Doree Games.   Results can be found under chart review under CV PROC. 08/04/2023 5:09 PM Johnnathan Hagemeister RVT, RDMS

## 2023-08-05 ENCOUNTER — Telehealth (HOSPITAL_COMMUNITY): Payer: Self-pay | Admitting: Pharmacy Technician

## 2023-08-05 ENCOUNTER — Other Ambulatory Visit (HOSPITAL_COMMUNITY): Payer: Self-pay

## 2023-08-05 ENCOUNTER — Inpatient Hospital Stay (HOSPITAL_COMMUNITY)

## 2023-08-05 ENCOUNTER — Other Ambulatory Visit: Payer: Self-pay

## 2023-08-05 DIAGNOSIS — I2699 Other pulmonary embolism without acute cor pulmonale: Principal | ICD-10-CM

## 2023-08-05 DIAGNOSIS — J189 Pneumonia, unspecified organism: Secondary | ICD-10-CM | POA: Diagnosis not present

## 2023-08-05 LAB — COMPREHENSIVE METABOLIC PANEL WITH GFR
ALT: 15 U/L (ref 0–44)
AST: 18 U/L (ref 15–41)
Albumin: 4 g/dL (ref 3.5–5.0)
Alkaline Phosphatase: 64 U/L (ref 38–126)
Anion gap: 11 (ref 5–15)
BUN: 16 mg/dL (ref 6–20)
CO2: 25 mmol/L (ref 22–32)
Calcium: 8.9 mg/dL (ref 8.9–10.3)
Chloride: 97 mmol/L — ABNORMAL LOW (ref 98–111)
Creatinine, Ser: 1.04 mg/dL (ref 0.61–1.24)
GFR, Estimated: >60 mL/min (ref >60)
Glucose, Bld: 108 mg/dL — ABNORMAL HIGH (ref 70–99)
Potassium: 4.2 mmol/L (ref 3.5–5.1)
Sodium: 133 mmol/L — ABNORMAL LOW (ref 135–145)
Total Bilirubin: 5.6 mg/dL — ABNORMAL HIGH (ref 0.0–1.2)
Total Protein: 8.1 g/dL (ref 6.5–8.1)

## 2023-08-05 LAB — COOXEMETRY PANEL
Carboxyhemoglobin: 1.3 % (ref 0.5–1.5)
Methemoglobin: <0.7 % (ref 0.0–1.5)
O2 Saturation: 48.1 %
Total hemoglobin: 16 g/dL (ref 12.0–16.0)

## 2023-08-05 LAB — RESP PANEL BY RT-PCR (RSV, FLU A&B, COVID)  RVPGX2
Influenza A by PCR: NEGATIVE
Influenza B by PCR: NEGATIVE
Resp Syncytial Virus by PCR: NEGATIVE
SARS Coronavirus 2 by RT PCR: NEGATIVE

## 2023-08-05 LAB — CBC
HCT: 48.9 % (ref 39.0–52.0)
Hemoglobin: 16.7 g/dL (ref 13.0–17.0)
MCH: 30.9 pg (ref 26.0–34.0)
MCHC: 34.2 g/dL (ref 30.0–36.0)
MCV: 90.6 fL (ref 80.0–100.0)
Platelets: 200 10*3/uL (ref 150–400)
RBC: 5.4 MIL/uL (ref 4.22–5.81)
RDW: 13.5 % (ref 11.5–15.5)
WBC: 18.7 10*3/uL — ABNORMAL HIGH (ref 4.0–10.5)
nRBC: 0 % (ref 0.0–0.2)

## 2023-08-05 LAB — HEPARIN LEVEL (UNFRACTIONATED): Heparin Unfractionated: 0.34 [IU]/mL (ref 0.30–0.70)

## 2023-08-05 LAB — EXPECTORATED SPUTUM ASSESSMENT W GRAM STAIN, RFLX TO RESP C

## 2023-08-05 LAB — PHOSPHORUS: Phosphorus: 3.8 mg/dL (ref 2.5–4.6)

## 2023-08-05 LAB — PROCALCITONIN: Procalcitonin: 0.11 ng/mL

## 2023-08-05 LAB — MAGNESIUM: Magnesium: 2.1 mg/dL (ref 1.7–2.4)

## 2023-08-05 MED ORDER — IPRATROPIUM-ALBUTEROL 0.5-2.5 (3) MG/3ML IN SOLN
3.0000 mL | RESPIRATORY_TRACT | Status: AC
  Administered 2023-08-05: 3 mL via RESPIRATORY_TRACT
  Filled 2023-08-05: qty 3

## 2023-08-05 MED ORDER — SODIUM CHLORIDE 0.9% FLUSH
10.0000 mL | Freq: Two times a day (BID) | INTRAVENOUS | Status: DC
  Administered 2023-08-05: 20 mL
  Administered 2023-08-06 – 2023-08-09 (×7): 10 mL

## 2023-08-05 MED ORDER — TORSEMIDE 20 MG PO TABS
20.0000 mg | ORAL_TABLET | Freq: Every day | ORAL | Status: DC
  Administered 2023-08-06 – 2023-08-07 (×2): 20 mg via ORAL
  Filled 2023-08-05 (×2): qty 1

## 2023-08-05 MED ORDER — MORPHINE SULFATE (PF) 4 MG/ML IV SOLN
4.0000 mg | INTRAVENOUS | Status: DC | PRN
  Administered 2023-08-05: 4 mg via INTRAVENOUS

## 2023-08-05 MED ORDER — MORPHINE SULFATE (PF) 2 MG/ML IV SOLN
4.0000 mg | INTRAVENOUS | Status: DC | PRN
  Administered 2023-08-06 (×2): 4 mg via INTRAVENOUS
  Filled 2023-08-05 (×2): qty 2

## 2023-08-05 MED ORDER — MORPHINE SULFATE (PF) 4 MG/ML IV SOLN
INTRAVENOUS | Status: AC
Start: 2023-08-05 — End: 2023-08-06
  Filled 2023-08-05: qty 1

## 2023-08-05 MED ORDER — MORPHINE SULFATE (PF) 4 MG/ML IV SOLN: 4.0000 mg | INTRAVENOUS | Status: DC | PRN

## 2023-08-05 MED ORDER — TORSEMIDE 20 MG PO TABS: 20.0000 mg | ORAL_TABLET | Freq: Two times a day (BID) | ORAL | Status: DC

## 2023-08-05 MED ORDER — MILRINONE LACTATE IN DEXTROSE 20-5 MG/100ML-% IV SOLN
0.3750 ug/kg/min | INTRAVENOUS | Status: DC
  Administered 2023-08-05 – 2023-08-06 (×3): 0.375 ug/kg/min via INTRAVENOUS
  Administered 2023-08-06: 0.25 ug/kg/min via INTRAVENOUS
  Administered 2023-08-07: 0.375 ug/kg/min via INTRAVENOUS
  Filled 2023-08-05 (×5): qty 100

## 2023-08-05 MED ORDER — AZITHROMYCIN 500 MG PO TABS
500.0000 mg | ORAL_TABLET | Freq: Every day | ORAL | Status: AC
  Administered 2023-08-06 – 2023-08-07 (×2): 500 mg via ORAL
  Filled 2023-08-05 (×2): qty 1

## 2023-08-05 MED ORDER — SODIUM CHLORIDE 0.9% FLUSH: 10.0000 mL | INTRAVENOUS | Status: DC | PRN

## 2023-08-05 MED ORDER — MEXILETINE HCL 200 MG PO CAPS
200.0000 mg | ORAL_CAPSULE | Freq: Two times a day (BID) | ORAL | Status: DC
  Administered 2023-08-05 – 2023-08-06 (×3): 200 mg via ORAL
  Filled 2023-08-05 (×6): qty 1

## 2023-08-05 MED ORDER — COLCHICINE 0.6 MG PO TABS
0.6000 mg | ORAL_TABLET | Freq: Every day | ORAL | Status: DC
  Administered 2023-08-05 – 2023-08-10 (×6): 0.6 mg via ORAL
  Filled 2023-08-05 (×6): qty 1

## 2023-08-05 MED ORDER — ACETAMINOPHEN 500 MG PO TABS
1000.0000 mg | ORAL_TABLET | Freq: Three times a day (TID) | ORAL | Status: AC
Start: 2023-08-05 — End: 2023-08-10
  Administered 2023-08-05 – 2023-08-10 (×15): 1000 mg via ORAL
  Filled 2023-08-05 (×14): qty 2

## 2023-08-05 MED ORDER — POLYETHYLENE GLYCOL 3350 17 G PO PACK
17.0000 g | PACK | Freq: Two times a day (BID) | ORAL | Status: DC
  Administered 2023-08-05 – 2023-08-08 (×4): 17 g via ORAL
  Filled 2023-08-05 (×11): qty 1

## 2023-08-05 MED ORDER — TORSEMIDE 20 MG PO TABS
ORAL_TABLET | ORAL | Status: AC
  Filled 2023-08-05: qty 1

## 2023-08-05 MED ORDER — TORSEMIDE 20 MG PO TABS
20.0000 mg | ORAL_TABLET | Freq: Two times a day (BID) | ORAL | Status: DC
Start: 2023-08-05 — End: 2023-08-05

## 2023-08-05 MED ORDER — ACETAMINOPHEN 325 MG PO TABS: 650.0000 mg | ORAL_TABLET | Freq: Four times a day (QID) | ORAL | Status: DC | PRN

## 2023-08-05 MED ORDER — ACETAMINOPHEN 500 MG PO TABS
ORAL_TABLET | ORAL | Status: AC
  Filled 2023-08-05: qty 2

## 2023-08-05 MED ORDER — OXYCODONE HCL 5 MG PO TABS
5.0000 mg | ORAL_TABLET | ORAL | Status: DC | PRN
  Filled 2023-08-05: qty 1

## 2023-08-05 MED ORDER — IOHEXOL 350 MG/ML SOLN
75.0000 mL | Freq: Once | INTRAVENOUS | Status: AC | PRN
  Administered 2023-08-05: 75 mL via INTRAVENOUS

## 2023-08-05 MED ORDER — CHLORHEXIDINE GLUCONATE CLOTH 2 % EX PADS
6.0000 | MEDICATED_PAD | Freq: Every day | CUTANEOUS | Status: DC
  Administered 2023-08-05 – 2023-08-10 (×6): 6 via TOPICAL

## 2023-08-05 MED ORDER — OXYCODONE HCL 5 MG PO TABS
10.0000 mg | ORAL_TABLET | ORAL | Status: DC | PRN
  Administered 2023-08-05 – 2023-08-07 (×9): 10 mg via ORAL
  Filled 2023-08-05 (×11): qty 2

## 2023-08-05 NOTE — Progress Notes (Addendum)
 [name]  ZOX:[id number] DOB: [birth year] DOA: 08/03/2023 PCP: Lawrance Presume, MD  Chief Complaint  Patient presents with   Shortness of Breath   Cough    Brief Narrative:   Reginald Fernandez is Reginald Fernandez 43 y.o. male with medical history significant of CAD s/p PCI, HTN, HLD, and morbid obesity p/w SOB/DOE 2/2 RML PE/LLL PE as well as RLL CAP.   Assessment & Plan:   Principal Problem:   Pulmonary emboli (HCC) Active Problems:   Community acquired pneumonia of right lung  Pleuritic Chest Pain  Due to pulmonary embolism (? Developing infarcts) and pneumonia Pain seems like it's worse overall, will repeat imaging given his profound discomfort on exam  Continue pain regimen, colchicine  added per cards.  Bowel regimen.   Hemoptysis In the setting of pneumonia and his pulmonary embolism  Hb is stable  Monitor for volume   Acute Pulmonary Embolism CT with minimal segmental R middle and LLL pulmonary emboli with minimal clot burden LE US  pending Echo with EF <20%, RVSF severely reduced (this appears similar to previous echo 05/2022) Heparin  gtt Will transition back to eliquis  once stable.  Will need education regarding his need for indefinite anticoagulation (given this recurrence of VTE)  Community Acquired Pneumonia Leukocytosis Worsening white count today.  Afebrile. Follow MRSA PCR, urine strep, urine legionella.  Sputum cx if able to collect. Blood cultures per HF team Negative covid, flu, RSV Continue abx   HFrEF Echo as noted above Appreciate HF team assistance Digoxin , farxiga , spironolactone .  Beta blocker, entresto  on hold per cards. torsemide  He needs ICD.    PVC's Started on mexiletine per cards Beta blocker currently on hold   CAD ASA, statin, zetia    Fatty Liver Dz Noted  Tobacco Abuse Encourage cessation    DVT prophylaxis: heparin  gtt Code Status: full Family Communication: none Disposition:   Status is:  Inpatient Remains inpatient appropriate because: need for ongoing inpatient    Consultants:  none  Procedures:  Echo IMPRESSIONS     1. Left ventricular ejection fraction, by estimation, is <20%. The left  ventricle has severely decreased function. The left ventricle demonstrates  global hypokinesis. The left ventricular internal cavity size was severely  dilated. Left ventricular  diastolic parameters are indeterminate.   2. Right ventricular systolic function is severely reduced. The right  ventricular size is severely enlarged. There is mildly elevated pulmonary  artery systolic pressure. The estimated right ventricular systolic  pressure is 40.4 mmHg.   3. Left atrial size was severely dilated.   4. Right atrial size was severely dilated.   5. The mitral valve is normal in structure. Trivial mitral valve  regurgitation. No evidence of mitral stenosis.   6. The aortic valve is tricuspid. There is mild calcification of the  aortic valve. Aortic valve regurgitation is not visualized. No aortic  stenosis is present.   7. The inferior vena cava is dilated in size with <50% respiratory  variability, suggesting right atrial pressure of 15 mmHg.     Antimicrobials:  Anti-infectives (From admission, onward)    Start     Dose/Rate Route Frequency Ordered Stop   08/06/23 1000  azithromycin  (ZITHROMAX ) tablet 500 mg        500 mg Oral Daily 08/05/23 1310 08/08/23 0959   08/04/23 1000  azithromycin  (ZITHROMAX ) tablet 500 mg        500 mg Oral Daily 08/03/23 1614 08/05/23 0942   08/04/23 0600  cefTRIAXone  (  ROCEPHIN ) 1 g in sodium chloride  0.9 % 100 mL IVPB        1 g 200 mL/hr over 30 Minutes Intravenous Every 24 hours 08/03/23 1614 08/08/23 0559   08/03/23 1515  cefTRIAXone  (ROCEPHIN ) 1 g in sodium chloride  0.9 % 100 mL IVPB        1 g 200 mL/hr over 30 Minutes Intravenous  Once 08/03/23 1505 08/03/23 1646   08/03/23 1515  azithromycin  (ZITHROMAX ) 500 mg in sodium chloride  0.9 %  250 mL IVPB        500 mg 250 mL/hr over 60 Minutes Intravenous  Once 08/03/23 1505 08/03/23 2106       Subjective: Continued pain, pain is worse than when he arrived  Objective: Vitals:   08/04/23 2358 08/05/23 0558 08/05/23 0855 08/05/23 0942  BP: 106/86 (!) 127/92 (!) 128/98 (!) 128/98  Pulse: 69 85 80 80  Resp: 18 18 17    Temp: 97.8 F (36.6 C)  (!) 97.5 F (36.4 C)   TempSrc: Oral  Oral   SpO2: 100% 92% 97%   Weight:      Height:        Intake/Output Summary (Last 24 hours) at 08/05/2023 1426 Last data filed at 08/04/2023 1557 Gross per 24 hour  Intake 326.66 ml  Output --  Net 326.66 ml   Filed Weights   08/03/23 1018  Weight: 111.6 kg    Examination:  General: appears uncomfortable, sitting on his knees in [name] chair, laying over the bed (bc this is the most comfortable position for him) Cardiovascular: RRR Lungs: diminished Neurological: Alert and oriented 3. Moves all extremities 4 with equal strength. Cranial nerves II through XII grossly intact. Extremities: No clubbing or cyanosis. No edema.    Data Reviewed: I have personally reviewed following labs and imaging studies  CBC: Recent Labs  Lab 08/03/23 1025 08/04/23 0850 08/05/23 0446  WBC 13.4* 15.3* 18.7*  HGB 16.5 16.2 16.7  HCT 48.2 48.4 48.9  MCV 89.8 90.1 90.6  PLT 202 198 200    Basic Metabolic Panel: Recent Labs  Lab 08/03/23 1025 08/04/23 1802 08/05/23 0446  NA 136 137 133*  K 3.9 4.1 4.2  CL 99 96* 97*  CO2 25 28 25   GLUCOSE 191* 117* 108*  BUN 12 12 16   CREATININE 1.17 1.21 1.04  CALCIUM  8.9 9.3 8.9  MG  --   --  2.1  PHOS  --   --  3.8    GFR: Estimated Creatinine Clearance: 121.8 mL/min (by C-G formula based on SCr of 1.04 mg/dL).  Liver Function Tests: Recent Labs  Lab 08/05/23 0446  AST 18  ALT 15  ALKPHOS 64  BILITOT 5.6*  PROT 8.1  ALBUMIN 4.0    CBG: No results for input(s): "GLUCAP" in the last 168 hours.   Recent Results (from the past 240  hours)  Resp panel by RT-PCR (RSV, Flu Ashlin Kreps&B, Covid) Nasal Mucosa     Status: None   Collection Time: 08/05/23  9:32 AM   Specimen: Nasal Mucosa; Nasal Swab  Result Value Ref Range Status   SARS Coronavirus 2 by RT PCR NEGATIVE NEGATIVE Final   Influenza Tilak Oakley by PCR NEGATIVE NEGATIVE Final   Influenza B by PCR NEGATIVE NEGATIVE Final    Comment: (NOTE) The Xpert Xpress SARS-CoV-2/FLU/RSV plus assay is intended as an aid in the diagnosis of influenza from Nasopharyngeal swab specimens and should not be used as Chrystian Cupples sole basis for treatment. Nasal washings and  aspirates are unacceptable for Xpert Xpress SARS-CoV-2/FLU/RSV testing.  Fact Sheet for Patients: BloggerCourse.com  Fact Sheet for Healthcare Providers: SeriousBroker.it  This test is not yet approved or cleared by the [place]  FDA and has been authorized for detection and/or diagnosis of SARS-CoV-2 by FDA under an Emergency Use Authorization (EUA). This EUA will remain in effect (meaning this test can be used) for the duration of the COVID-19 declaration under Section 564(b)(1) of the Act, 21 U.S.C. section 360bbb-3(b)(1), unless the authorization is terminated or revoked.     Resp Syncytial Virus by PCR NEGATIVE NEGATIVE Final    Comment: (NOTE) Fact Sheet for Patients: BloggerCourse.com  Fact Sheet for Healthcare Providers: SeriousBroker.it  This test is not yet approved or cleared by the [place]  FDA and has been authorized for detection and/or diagnosis of SARS-CoV-2 by FDA under an Emergency Use Authorization (EUA). This EUA will remain in effect (meaning this test can be used) for the duration of the COVID-19 declaration under Section 564(b)(1) of the Act, 21 U.S.C. section 360bbb-3(b)(1), unless the authorization is terminated or revoked.  Performed at [facility], [address], [place],  [place] [address]          Radiology Studies: US  EKG SITE RITE Result Date: 08/05/2023 If Site Rite image not attached, placement could not be confirmed due to current cardiac rhythm.  VAS US  LOWER EXTREMITY VENOUS (DVT) Result Date: 08/04/2023  Lower Venous DVT Study Patient Name:  NICKLOUS ABURTO  Date of Exam:   08/04/2023 Medical Rec #: [id number]       Accession #:    [id number] Date of Birth: 1980/12/14       Patient Gender: M Patient Age:   89 years Exam Location:  [facility] Procedure:      VAS US  LOWER EXTREMITY VENOUS (DVT) Referring Phys: Arne Langdon --------------------------------------------------------------------------------  Indications: Pulmonary embolism.  Risk Factors: PE/DVT (2024) - patient stopped taking Eliquis  2 months ago. Comparison Study: Previous exam on 06/05/2022 was positive for DVT - RT PopV & LT                   FV and PTV. Performing Technologist: Arlyce Berger RVT, RDMS  Examination Guidelines: Arali Somera complete evaluation includes B-mode imaging, spectral Doppler, color Doppler, and power Doppler as needed of all accessible portions of each vessel. Bilateral testing is considered an integral part of Odessie Polzin complete examination. Limited examinations for reoccurring indications may be performed as noted. The reflux portion of the exam is performed with the patient in reverse Trendelenburg.  +---------+---------------+---------+-----------+----------+--------------+ RIGHT    CompressibilityPhasicitySpontaneityPropertiesThrombus Aging +---------+---------------+---------+-----------+----------+--------------+ CFV      Full           No       Yes                                 +---------+---------------+---------+-----------+----------+--------------+ SFJ      Full                                                        +---------+---------------+---------+-----------+----------+--------------+ FV Prox  Full           No       Yes                                  +---------+---------------+---------+-----------+----------+--------------+  FV Mid   Full           No       Yes                                 +---------+---------------+---------+-----------+----------+--------------+ FV DistalFull           No       Yes                                 +---------+---------------+---------+-----------+----------+--------------+ PFV      Full                                                        +---------+---------------+---------+-----------+----------+--------------+ POP      Full           No       Yes                                 +---------+---------------+---------+-----------+----------+--------------+ PTV      Full                                                        +---------+---------------+---------+-----------+----------+--------------+ PERO     Full                                                        +---------+---------------+---------+-----------+----------+--------------+ diminished, pulsatile flow throughout extremity  +---------+---------------+---------+-----------+----------+-----------------+ LEFT     CompressibilityPhasicitySpontaneityPropertiesThrombus Aging    +---------+---------------+---------+-----------+----------+-----------------+ CFV      Full           No       Yes                                    +---------+---------------+---------+-----------+----------+-----------------+ SFJ      Full                                                           +---------+---------------+---------+-----------+----------+-----------------+ FV Prox  Full           No       Yes                                    +---------+---------------+---------+-----------+----------+-----------------+ FV Mid   Full           No       Yes                                    +---------+---------------+---------+-----------+----------+-----------------+  FV DistalFull           No       Yes                                     +---------+---------------+---------+-----------+----------+-----------------+ PFV      Full                                                           +---------+---------------+---------+-----------+----------+-----------------+ POP      Full           No       Yes                                    +---------+---------------+---------+-----------+----------+-----------------+ PTV      None           No       No                   Age Indeterminate +---------+---------------+---------+-----------+----------+-----------------+ PERO     Full                                                           +---------+---------------+---------+-----------+----------+-----------------+ pulsatile flow throughout extremity   Summary: BILATERAL: -No evidence of popliteal cyst, bilaterally. RIGHT: - There is no evidence of deep vein thrombosis in the lower extremity.  LEFT: - Findings consistent with age indeterminate deep vein thrombosis involving the left posterior tibial veins.   *See table(s) above for measurements and observations.    Preliminary    ECHOCARDIOGRAM COMPLETE Result Date: 08/04/2023    ECHOCARDIOGRAM REPORT   Patient Name:   Reginald Fernandez Date of Exam: 08/04/2023 Medical Rec #:  [id number]      Height:       74.0 in Accession #:    [id number]     Weight:       246.0 lb Date of Birth:  03-17-1981      BSA:          2.373 m Patient Age:    43 years       BP:           106/75 mmHg Patient Gender: M              HR:           96 bpm. Exam Location:  Inpatient Procedure: 2D Echo, Cardiac Doppler, Color Doppler and Intracardiac            Opacification Agent (Both Spectral and Color Flow Doppler were            utilized during procedure). Indications:    Pulmonary Embolus  History:        Patient has prior history of Echocardiogram examinations, most                 recent 06/04/2022. Risk Factors:Hypertension.  Sonographer:    Janette Medley Referring Phys:  [phone] Cavan Bearden CALDWELL POWELL JR IMPRESSIONS  1. Left ventricular ejection fraction, by estimation, is <20%. The left ventricle has severely decreased function. The left ventricle demonstrates global hypokinesis. The left ventricular internal cavity size was severely dilated. Left ventricular diastolic parameters are indeterminate.  2. Right ventricular systolic function is severely reduced. The right ventricular size is severely enlarged. There is mildly elevated pulmonary artery systolic pressure. The estimated right ventricular systolic pressure is 40.4 mmHg.  3. Left atrial size was severely dilated.  4. Right atrial size was severely dilated.  5. The mitral valve is normal in structure. Trivial mitral valve regurgitation. No evidence of mitral stenosis.  6. The aortic valve is tricuspid. There is mild calcification of the aortic valve. Aortic valve regurgitation is not visualized. No aortic stenosis is present.  7. The inferior vena cava is dilated in size with <50% respiratory variability, suggesting right atrial pressure of 15 mmHg. FINDINGS  Left Ventricle: Left ventricular ejection fraction, by estimation, is <20%. The left ventricle has severely decreased function. The left ventricle demonstrates global hypokinesis. The left ventricular internal cavity size was severely dilated. There is no left ventricular hypertrophy. Left ventricular diastolic parameters are indeterminate. Right Ventricle: The right ventricular size is severely enlarged. No increase in right ventricular wall thickness. Right ventricular systolic function is severely reduced. There is mildly elevated pulmonary artery systolic pressure. The tricuspid regurgitant velocity is 2.52 m/s, and with an assumed right atrial pressure of 15 mmHg, the estimated right ventricular systolic pressure is 40.4 mmHg. Left Atrium: Left atrial size was severely dilated. Right Atrium: Right atrial size was severely dilated. Pericardium: There is no evidence of  pericardial effusion. Mitral Valve: The mitral valve is normal in structure. Trivial mitral valve regurgitation. No evidence of mitral valve stenosis. Tricuspid Valve: The tricuspid valve is normal in structure. Tricuspid valve regurgitation is trivial. No evidence of tricuspid stenosis. Aortic Valve: The aortic valve is tricuspid. There is mild calcification of the aortic valve. Aortic valve regurgitation is not visualized. No aortic stenosis is present. Pulmonic Valve: The pulmonic valve was normal in structure. Pulmonic valve regurgitation is trivial. No evidence of pulmonic stenosis. Aorta: The aortic root is normal in size and structure. Venous: The inferior vena cava is dilated in size with less than 50% respiratory variability, suggesting right atrial pressure of 15 mmHg. IAS/Shunts: No atrial level shunt detected by color flow Doppler.  LEFT VENTRICLE PLAX 2D LVIDd:         6.60 cm   Diastology LVIDs:         5.90 cm   LV e' lateral: 7.77 cm/s LV PW:         1.10 cm LV IVS:        1.10 cm LVOT diam:     2.10 cm LV SV:         50 LV SV Index:   21 LVOT Area:     3.46 cm  RIGHT VENTRICLE             IVC RV S prime:     11.00 cm/s  IVC diam: 2.80 cm TAPSE (M-mode): 2.8 cm LEFT ATRIUM              Index        RIGHT ATRIUM           Index LA diam:        4.20 cm  1.77 cm/m   RA Area:     28.70 cm LA Vol (A2C):   113.0 ml 47.61 ml/m  RA Volume:   98.50 ml  41.50 ml/m LA Vol (A4C):   106.0 ml 44.66 ml/m LA Biplane Vol: 114.0 ml 48.03 ml/m  AORTIC VALVE LVOT Vmax:   101.00 cm/s LVOT Vmean:  63.700 cm/s LVOT VTI:    0.145 m  AORTA Ao Root diam: 3.20 cm Ao Asc diam:  3.50 cm TRICUSPID VALVE TR Peak grad:   25.4 mmHg TR Vmax:        252.00 cm/s  SHUNTS Systemic VTI:  0.14 m Systemic Diam: 2.10 cm Jules Oar MD Electronically signed by Jules Oar MD Signature Date/Time: 08/04/2023/3:42:15 PM    Final         Scheduled Meds:  aspirin  EC  81 mg Oral Daily   atorvastatin   80 mg Oral q1800    [START ON 08/06/2023] azithromycin   500 mg Oral Daily   colchicine   0.6 mg Oral Daily   dapagliflozin  propanediol  10 mg Oral Daily   digoxin   0.125 mg Oral Daily   ezetimibe   10 mg Oral Daily   guaiFENesin   600 mg Oral BID   mexiletine  200 mg Oral Q12H   pantoprazole   40 mg Oral Daily   [START ON 08/06/2023] torsemide   20 mg Oral Daily   Continuous Infusions:  cefTRIAXone  (ROCEPHIN )  IV 1 g (08/05/23 0557)   heparin  2,000 Units/hr (08/05/23 0600)     LOS: 2 days    Time spent: over 30 min    Donnetta Gains, MD [facility]   To contact the attending provider between 7A-7P or the covering provider during after hours 7P-7A, please log into the web site [email or web address] and access using universal [place] password for that web site. If you do not have the password, please call the hospital operator.  08/05/2023, 2:26 PM

## 2023-08-05 NOTE — Progress Notes (Signed)
 PHARMACY - ANTICOAGULATION CONSULT NOTE  Pharmacy Consult for heparin    Indication: pulmonary embolus  Allergies  Allergen Reactions   Penicillins Other (See Comments)    Childhood allergy Unknown reaction  Did it involve swelling of the face/tongue/throat, SOB, or low BP? No Did it involve sudden or severe rash/hives, skin peeling, or any reaction on the inside of your mouth or nose? No Did you need to seek medical attention at a hospital or doctor's office? No When did it last happen?      child If all above answers are "NO", may proceed with cephalosporin use.    Patient Measurements: Height: 6\' 2"  (188 cm) Weight: 111.6 kg (246 lb) IBW/kg (Calculated) : 82.2 HEPARIN  DW (KG): 105.4  Vital Signs: Temp: 97.5 F (36.4 C) (05/19 0855) Temp Source: Oral (05/19 0855) BP: 128/98 (05/19 0942) Pulse Rate: 80 (05/19 0942)  Labs: Recent Labs    08/03/23 1025 08/03/23 1225 08/03/23 2156 08/04/23 0850 08/04/23 1802 08/05/23 0446  HGB 16.5  --   --  16.2  --  16.7  HCT 48.2  --   --  48.4  --  48.9  PLT 202  --   --  198  --  200  HEPARINUNFRC  --   --    < > 0.43 0.30 0.34  CREATININE 1.17  --   --   --  1.21 1.04  TROPONINIHS 30* 29*  --   --   --   --    < > = values in this interval not displayed.    Estimated Creatinine Clearance: 121.8 mL/min (by C-G formula based on SCr of 1.04 mg/dL).   Medical History: Past Medical History:  Diagnosis Date   CAD S/P percutaneous coronary angioplasty 11/20/2013   a. 09/2009: EKG with Inf STEMI - no obstructive CAD;  b. 11/2013 Inflat STEMI/PCI: LM nl, LAD 20p, D1 sm - diff dzs, D2 large - nl, LCX 95-99, OM1 100 (3.5x38 Promus Premier DES), RCA 95-8m, 80d (3.0x20 and 3.0x16 Promus Premier DES') - normal EF;  c. NSTEMI 5/18 - patent stents, otw minimal CAD.- EF by Echo 35-40%; d. NSTEMI 10/2018 - ? culprit - Small branch of  D2,Med Rx,.  EF by Echo ~25%   Cardiomyopathy, ischemic 07/2016   h/o Inf STEMI 11/2013 - EF was "Normal"; b)  NSTEMI 07/2016 (NO Cultprit on Cath) - Echo EF 35-40% (Basal Ant-Lat HK, basal-mid Inferolateral & apical Akinesis); c) NSTEMI 10/2018 -felt to be occluded small branch of D2, Echo EF further decreased to 20-25%.    Essential hypertension    Hyperlipidemia with target LDL less than 100    Marijuana abuse    Migraine    Morbid obesity (HCC)    ST-segment elevation myocardial infarction (STEMI) of inferior wall (HCC) 11/20/2013   Assessment: Patient presenting with chest tightness, found to have PE w/o RHS. Hx of PE supposed to be taking Eliquis  however has not taken for approximately 1 month, pt reports wanting to trial off of medication. Pharmacy consulted to dose heparin .   Heparin  level came back this morning therapeutic at 0.34 on heparin  infusion at 2000 units/hr. Hgb 16.2, plt 198. No s/sx of bleeding or infusion issues.   5/18 PM - heparin  level on lower end of therapeutic range. No issues / interruptions.  Goal of Therapy:  Heparin  level 0.3-0.7 units/ml Monitor platelets by anticoagulation protocol: Yes   Plan:  Continue heparin  infusion 2000 units/hr  Daily cbc and heparin  level  Monitor s/s  sx of bleeding    Hortensia Ma Pharm.D. CPP, BCPS Clinical Pharmacist [phone] 08/05/2023 10:29 AM

## 2023-08-05 NOTE — Telephone Encounter (Signed)
 Patient Product/process development scientist completed.    The patient is insured through [employer]. Patient has [employer], may use a copay card, and/or apply for patient assistance if available.    Ran test claim for Eliquis  5 mg and the current 30 day co-pay is $15.00.  Ran test claim for Xarelto 20 mg and the current 30 day co-pay is $15.00.  This test claim was processed through [facility]- copay amounts may vary at other pharmacies due to pharmacy/plan contracts, or as the patient moves through the different stages of their insurance plan.     Morgan Arab, CPHT Pharmacy Technician III Certified Patient Advocate [facility] Patient Advocate Team Direct Number: [phone]  Fax: [phone]

## 2023-08-05 NOTE — Progress Notes (Signed)
 2204- pt called to nurse station complaining of severe one sided pain and saying he was going to pass out and feelings of nausea  2209- secure paged coverage doc C. Hall, DO   2210- gave prn compazine  and doc ordered 1x dose of dilaudid . Given at 2215. Pt had some relief with dilaudid  and left sitting on side of bed. Heparin  gtt going. Sweating, RRWDL. Spo2 98. Care ongoing.   2306- this nurse was called back into room. Pt still having pain. 9/10 pain. Paged on call doc to ask for breathing treatment. Doc ordered morphine  prn for pain at 2321  2351- pt received morphine .  0031-pt called nurse station saying he was SOB. This nurse went in pt in tripod position standing using bed as support.  Sating 94% on RA. Nurse put pt on 3L of O2 to help increased work of breathing. Pain still unresolved 6/10.   0037-doc ordered duoneb   0054- breathing treatment given by this nurse. Pt stated relief of breathing difficulty and just complains of pain. Pt educated that this pain is expected of this disease process and voiced understanding. Paged doc to let her know he is still having pain despite interventions done.  22-  DOC told this nurse to give prn oxy a little early. Pt received this. Pt left in room in tripod position using bed as support. States that is the only position he feels comfortable in. Not a fall risk/pt independent. Call light within reach. 3L O2. Resp WDL. Care ongoing

## 2023-08-05 NOTE — Progress Notes (Signed)
 Peripherally Inserted Central Catheter Placement  The IV Nurse has discussed with the patient and/or persons authorized to consent for the patient, the purpose of this procedure and the potential benefits and risks involved with this procedure.  The benefits include less needle sticks, lab draws from the catheter, and the patient may be discharged home with the catheter. Risks include, but not limited to, infection, bleeding, blood clot (thrombus formation), and puncture of an artery; nerve damage and irregular heartbeat and possibility to perform a PICC exchange if needed/ordered by physician.  Alternatives to this procedure were also discussed.  Bard Power PICC patient education guide, fact sheet on infection prevention and patient information card has been provided to patient /or left at bedside.    PICC Placement Documentation  PICC Double Lumen 08/05/23 Right Brachial 41 cm 0 cm (Active)  Indication for Insertion or Continuance of Line Chronic illness with exacerbations (CF, Sickle Cell, etc.) 08/05/23 1526  Exposed Catheter (cm) 0 cm 08/05/23 1526  Site Assessment Clean, Dry, Intact 08/05/23 1526  Lumen #1 Status Flushed;Saline locked;Blood return noted 08/05/23 1526  Lumen #2 Status Flushed;Saline locked;Blood return noted 08/05/23 1526  Dressing Type Transparent;Securing device 08/05/23 1526  Dressing Status Antimicrobial disc/dressing in place;Clean, Dry, Intact 08/05/23 1526  Line Care Connections checked and tightened 08/05/23 1526  Line Adjustment (NICU/IV Team Only) No 08/05/23 1526  Dressing Intervention New dressing;Adhesive placed at insertion site (IV team only) 08/05/23 1526  Dressing Change Due 08/12/23 08/05/23 1526       Angelia Hazell Haywood Lisle 08/05/2023, 3:28 PM

## 2023-08-05 NOTE — TOC Initial Note (Addendum)
 Transition of Care [facility]) - Initial/Assessment Note    Patient Details  Name: Reginald Fernandez MRN: [id number] Date of Birth: 07/25/1980  [facility] CM/SW Contact:    Benjiman Bras, RN Phone Number: [phone] 08/05/2023, 9:52 AM  Clinical Narrative:                 TOC CM spoke to pt and states he works full time. States he will need a note for work. He has scale at home. Discussed daily weights and DASH diet/heart healthy diet.  Provided pt with Living Better with HF.   Will arrange PCP follow up appt closer to dc.   Expected Discharge Plan: Home/Self Care Barriers to Discharge: Continued Medical Work up   Patient Goals and CMS Choice Patient states their goals for this hospitalization and ongoing recovery are:: wants to get better          Expected Discharge Plan and Services   Discharge Planning Services: CM Consult   Living arrangements for the past 2 months: Single Family Home                                      Prior Living Arrangements/Services Living arrangements for the past 2 months: Single Family Home Lives with:: Parents Patient language and need for interpreter reviewed:: Yes Do you feel safe going back to the place where you live?: Yes      Need for Family Participation in Patient Care: No (Comment) Care giver support system in place?: No (comment) Current home services: DME (scale) Criminal Activity/Legal Involvement Pertinent to Current Situation/Hospitalization: No - Comment as needed  Activities of Daily Living   ADL Screening (condition at time of admission) Independently performs ADLs?: Yes (appropriate for developmental age) Is the patient deaf or have difficulty hearing?: No Does the patient have difficulty seeing, even when wearing glasses/contacts?: No Does the patient have difficulty concentrating, remembering, or making decisions?: No  Permission Sought/Granted Permission sought to share information with :  Case Manager, Family Supports, PCP Permission granted to share information with : Yes, Verbal Permission Granted  Share Information with NAME: Axtyn Woehler     Permission granted to share info w Relationship: mother  Permission granted to share info w Contact Information: [phone]  Emotional Assessment Appearance:: Appears stated age Attitude/Demeanor/Rapport: Engaged Affect (typically observed): Accepting Orientation: : Oriented to Self, Oriented to Place, Oriented to  Time, Oriented to Situation   Psych Involvement: No (comment)  Admission diagnosis:  Pulmonary emboli (HCC) [I26.99] Community acquired pneumonia of right lung, unspecified part of lung [J18.9] Other acute pulmonary embolism, unspecified whether acute cor pulmonale present (HCC) [I26.99] Patient Active Problem List   Diagnosis Date Noted   Community acquired pneumonia of right lung 08/04/2023   Pulmonary emboli (HCC) 08/03/2023   Acute pyelonephritis 06/07/2022   Acute pulmonary embolism (HCC) 06/06/2022   Elevated troponin 06/04/2022   Acute on chronic combined systolic and diastolic CHF (congestive heart failure) (HCC) 06/04/2022   CHF (congestive heart failure) (HCC) 10/09/2021   Educated about COVID-19 virus infection 12/06/2019   Prediabetes 10/25/2018   Dilated cardiomyopathy (HCC) 08/01/2016   Demand ischemia of myocardium (HCC) 07/31/2016   Snoring 02/22/2014   Daytime somnolence 02/22/2014   Snores 11/23/2013   Marijuana abuse    Morbid obesity (HCC)    History of acute inferior wall MI 11/20/2013   ST elevation myocardial infarction (  STEMI) of true posterior wall, subsequent episode of care [facility]) 11/20/2013   CAD S/P percutaneous coronary angioplasty: DES PCI -Cx-OM1, RCA x 2 11/20/2013    Class: Status post   Essential hypertension    Hyperlipidemia LDL goal <70    HYPERGLYCEMIA 10/25/2009   ASTHMA 10/24/2009   PCP:  Lawrance Presume, MD Pharmacy:   [place] [facility] [address] [place] [address] Phone: [phone] Fax: [phone]  [facility] [address] [place] [address] Phone: [phone] Fax: [phone]  [id number] [place], [place] - [address] AT [facility] OF [facility] & [address] [place] [address] Phone: [phone] Fax: [phone]     Social Drivers of Health (SDOH) Social History: SDOH Screenings   Food Insecurity: No Food Insecurity (08/03/2023)  Housing: Low Risk  (08/03/2023)  Transportation Needs: No Transportation Needs (08/03/2023)  Utilities: Not At Risk (08/03/2023)  Depression (PHQ2-9): Low Risk  (06/27/2022)  Tobacco Use: Medium Risk (08/03/2023)   SDOH Interventions:     Readmission Risk Interventions     No data to display

## 2023-08-05 NOTE — Plan of Care (Signed)

## 2023-08-05 NOTE — Progress Notes (Addendum)
 Advanced Heart Failure Team Consult Note  Primary Physician: Lawrance Presume, MD Cardiologist:  Randene Bustard, MD Reason for Consultation: Biventricular Heart Failure HPI:    Reginald Fernandez is seen today for evaluation of biventricular heart failure at the request of Dr. Ada Acres.   Reginald Fernandez is a 43 y.o. male w/ h/o HFrEF, premature CAD s/p multiple MIs, PE/DVT, HTN, HLD, fatty liver disease, tobacco use and poor compliance.    H/o inferior STEMI by ECG in 2011 with non-obstructive CAD. Again in 2015 w/ CP and inferolateral STE c/w STEMI. Emergent cath showed severe RCA and OM1 disease, both treated w/ PCI + DES. Echo showed normal EF, 55-60%. Lost to f/u.    In 2018 LHC showed two vessel disease with patent stents in the RCA, and OM of the Lcx with normal LVEDP. Unable to identify the culprit for Troponin elevation. Echo showed drop in EF down to 35-40% with basal anterolateral hypokinesis, and mid to apical akinesis, and G2DD. Lost to f/u again.    Readmitted 2020 for NSTEMI. Cath demonstrated the following:   Multivessel CAD with mild luminal irregularity of the LAD and evidence for 60% narrowing in a very small first diagonal vessel.  The second diagonal vessel bifurcates in its mid segment and there appears to be total occlusion of the inferior limb after the bifurcation.  (This was not able to be depicted on the diagram); the stent in the circumflex vessel which extends into the OM-1 vessel was widely patent and there is ostial pinching of the AV groove circumflex with narrowing of 50 to 60%; the RCA has luminal irregularity with 30% proximal and proximal to mid stenoses.  The mid RCA stent and distal RCA stent is widely patent.   He was treated medically. Echo showed EF 20-25%, GIIDD, RV mildly reduced.    Unfortunately, was lost to f/u again and had been off meds for > 1 year. Admitted 2023 with a/c CHF and diffuse fatty liver disease. Echo showed EF < 20%, RV severely reduced.  LHC showed diffuse CAD but no severe stenosis to explain worsening of his EF. RHC mildly elevated PCWP w/ pulmonary venous hypertension, normal RA pressure and preserved CO.   Last admitted 05/2022 with a/c CHF and found to have PE/DVT. Echo showed LVEF < 20%, RV moderately enlarged and severely reduced systolic function. Seen for follow up 4/24, however has been lost to follow up and off medications since.    Presented to [facility] 5/17 with shortness of breath. Reports that he had been having bloody congestion and cough. CTPE + for minimal clot burden PE, as well as RLL CAP. Labs notable for K 3.9, BUN/Cr 12/1.17, hs-trop 30>29, WBC, 134, and hgb 16.5. EKG showed SR with frequent polymorphic PVCs at 104 bpm. CXR with localized opacity in the RML and RLL. He was started on IV heparin  and IV azithro and rocephin .   On exam this morning he is in 7/10 R sided substernal chest pain that radiates to back. Location matches pleural area of PNA. Remains mildly short of breath, congestion improving. Extremities cool.   Home Medications Prior to Admission medications   Medication Sig Start Date End Date Taking? Authorizing Provider  acetaminophen  (TYLENOL ) 325 MG tablet Take 2 tablets (650 mg total) by mouth every 6 (six) hours as needed for mild pain (or Fever >/= 101). 06/07/22  Yes Gonfa, Taye T, MD  aspirin  EC 81 MG tablet Take 1 tablet (81 mg total) by mouth  daily. 10/12/21  Yes Saralee Cummins, Brittainy M, PA-C  atorvastatin  (LIPITOR ) 80 MG tablet Take 1 tablet (80 mg total) by mouth daily at 6 PM 03/18/23  Yes Milford, Arlice Bene, FNP  carvedilol  (COREG ) 3.125 MG tablet Take 3 tablets (9.375 mg total) by mouth 2 (two) times daily with a meal. 06/07/22  Yes Gonfa, Ella Gun, MD  dapagliflozin  propanediol (FARXIGA ) 10 MG TABS tablet Take 1 tablet (10 mg total) by mouth daily. 06/27/22  Yes Dulce Gibbs M, PA-C  digoxin  (LANOXIN ) 0.125 MG tablet Take 1 tablet (0.125 mg total) by mouth daily. 07/20/22  Yes Clegg, Amy D, NP   docusate sodium  (COLACE) 100 MG capsule Take 1 capsule (100 mg total) by mouth every 12 (twelve) hours. Patient taking differently: Take 100 mg by mouth as needed. 07/11/22  Yes Gretel Leaven, PA-C  ezetimibe  (ZETIA ) 10 MG tablet Take 1 tablet (10 mg total) by mouth daily. 03/18/23  Yes Milford, Arlice Bene, FNP  nitroGLYCERIN  (NITROSTAT ) 0.4 MG SL tablet Place 1 tablet (0.4 mg total) under the tongue every 5 (five) minutes as needed for chest pain. 10/12/21  Yes Simmons, Brittainy M, PA-C  pantoprazole  (PROTONIX ) 40 MG tablet Take 1 tablet (40 mg total) by mouth daily. Patient taking differently: Take 40 mg by mouth daily. As needed 06/08/22  Yes Gonfa, Taye T, MD  sacubitril -valsartan  (ENTRESTO ) 24-26 MG Take 1 tablet by mouth 2 (two) times daily. 06/21/22  Yes Ruddy Corral M, PA-C  spironolactone  (ALDACTONE ) 25 MG tablet Take 1 tablet (25 mg total) by mouth daily. 10/17/21  Yes Milford, Arlice Bene, FNP  torsemide  (DEMADEX ) 20 MG tablet Take 1 tablet (20 mg total) by mouth 2 (two) times daily. 07/20/22 08/03/23 Yes Clegg, Amy D, NP  traMADol  (ULTRAM ) 50 MG tablet Take 1 tablet (50 mg total) by mouth every 6 (six) hours as needed. 07/11/22  Yes Gretel Leaven, PA-C  apixaban  (ELIQUIS ) 5 MG TABS tablet Take 1 tablet (5 mg total) by mouth 2 (two) times daily. Start after you finish starter pack Eliquis . 07/06/22   Gonfa, Taye T, MD  furosemide  (LASIX ) 20 MG tablet Take 1 tablet (20 mg total) by mouth daily. Patient not taking: Reported on 08/03/2023 10/17/21   Elmarie Hacking, FNP  senna-docusate (SENOKOT-S) 8.6-50 MG tablet Take 1 tablet by mouth 2 (two) times daily. Patient not taking: Reported on 08/03/2023 06/07/22   Theadore Finger, MD    Past Medical History: Past Medical History:  Diagnosis Date   CAD S/P percutaneous coronary angioplasty 11/20/2013   a. 09/2009: EKG with Inf STEMI - no obstructive CAD;  b. 11/2013 Inflat STEMI/PCI: LM nl, LAD 20p, D1 sm - diff dzs, D2 large - nl, LCX 95-99, OM1 100  (3.5x38 Promus Premier DES), RCA 95-52m, 80d (3.0x20 and 3.0x16 Promus Premier DES') - normal EF;  c. NSTEMI 5/18 - patent stents, otw minimal CAD.- EF by Echo 35-40%; d. NSTEMI 10/2018 - ? culprit - Small branch of  D2,Med Rx,.  EF by Echo ~25%   Cardiomyopathy, ischemic 07/2016   h/o Inf STEMI 11/2013 - EF was "Normal"; b) NSTEMI 07/2016 (NO Cultprit on Cath) - Echo EF 35-40% (Basal Ant-Lat HK, basal-mid Inferolateral & apical Akinesis); c) NSTEMI 10/2018 -felt to be occluded small branch of D2, Echo EF further decreased to 20-25%.    Essential hypertension    Hyperlipidemia with target LDL less than 100    Marijuana abuse    Migraine    Morbid obesity (HCC)  ST-segment elevation myocardial infarction (STEMI) of inferior wall (HCC) 11/20/2013    Past Surgical History: Past Surgical History:  Procedure Laterality Date   LEFT HEART CATH AND CORONARY ANGIOGRAPHY N/A 08/01/2016   Procedure: Left Heart Cath and Coronary Angiography;  Surgeon: Arnoldo Lapping, MD;  Location: [facility] INVASIVE CV LAB;;  Stable 2 V CAD - patent RCA & OM stents. Diffuse, non-obstructive CAD elsewhere. Moderately elevated LVEDP --> No culprit lesion  => restarted Plavix  & titrate Med Rx.   LEFT HEART CATH AND CORONARY ANGIOGRAPHY N/A 10/24/2018   Procedure: LEFT HEART CATH AND CORONARY ANGIOGRAPHY;  Surgeon: Millicent Ally, MD;  Location: [facility] INVASIVE CV LAB;  60% small D1, D2 has 3 branches 1 of which is small and appears to be 100%, LCx-OM widely patent with mild 55% stenosis in the jailed AV groove LCx. RCA has mild luminal irregularities 30% proximal with widely patent stents in the mid and distal RCA. (Culprit 100% small branch of D2)   LEFT HEART CATH AND CORONARY ANGIOGRAPHY  09/2009   (Presumably in the setting of STEMI) mild RCA luminal Irregularities - MIld INf HK -- Med Rx; b)   LEFT HEART CATHETERIZATION WITH CORONARY ANGIOGRAM N/A 11/20/2013   Procedure: LEFT HEART CATHETERIZATION WITH CORONARY ANGIOGRAM;  Surgeon:  Arleen Lacer, MD;  Location: [facility] CATH LAB;  Service: Cardiovascular;   Inf STEMI: OM1 100%, m-dRCA 95-99% thrombotic mRCA with tandem 80% -- PCI, LAD & Cx relatively normal.   PERCUTANEOUS CORONARY STENT INTERVENTION (PCI-S)  11/20/2013   a) PCI - OM1 Promus Premier DES 3.5 mm x 38 mm, m-d RCA Promus Premier DES  3.0 mm x 20 mm & 3.0 mm x 16 mm   RIGHT/LEFT HEART CATH AND CORONARY ANGIOGRAPHY N/A 10/11/2021   Procedure: RIGHT/LEFT HEART CATH AND CORONARY ANGIOGRAPHY;  Surgeon: Darlis Eisenmenger, MD;  Location: [facility] INVASIVE CV LAB;  Service: Cardiovascular;  Laterality: N/A;   TRANSTHORACIC ECHOCARDIOGRAM  11/20/2013   LV EF 55-60%. No regional wall motion abnormality.  Normal valves    TRANSTHORACIC ECHOCARDIOGRAM  08/11/2016   (Non-STEMI-no culprit lesion): Moderately dilated LV.  Basal anterolateral HK with basal-mid inferolateral akinesis.  Mild-apical inferior akinesis.  EF 35 to 40%.  GRII DD.  Mild biatrial dilation.  Normal RV size and function. Mostly normal valves.   TRANSTHORACIC ECHOCARDIOGRAM  10/24/2018   (Non-STEMI-culprit lesion was 1 of 3 small 2 branches.)  EF 20-25%.  Mild mildly dilated LV.  GR 2 DD.  Mild biatrial dilation.  Normal PA pressures.    Family History: Family History  Problem Relation Age of Onset   Hypertension Mother    Heart attack Father 10       2 MIs by age 3 (first at 74)-- CABG   Hypertension Father    Heart failure Father    Hyperlipidemia Father    Heart attack Maternal Grandfather        45's   Heart attack Paternal Grandfather        18's   Heart attack Other     Social History: Social History   Socioeconomic History   Marital status: Married    Spouse name: Not on file   Number of children: 2   Years of education: Not on file   Highest education level: Not on file  Occupational History    Employer: Psychologist, counselling    Comment: [employer]  Tobacco Use   Smoking status: Former    Current packs/day: 0.00  Types:  Cigarettes    Quit date: 09/2016    Years since quitting: 6.8   Smokeless tobacco: Never  Vaping Use   Vaping status: Every Day  Substance and Sexual Activity   Alcohol use: No    Alcohol/week: 0.0 standard drinks of alcohol   Drug use: No   Sexual activity: Yes  Other Topics Concern   Not on file  Social History Narrative   Lives in [place] with wife.  Works with mentally challenged adults.   Social Drivers of Corporate investment banker Strain: Not on file  Food Insecurity: No Food Insecurity (08/03/2023)   Hunger Vital Sign    Worried About Running Out of Food in the Last Year: Never true    Ran Out of Food in the Last Year: Never true  Transportation Needs: No Transportation Needs (08/03/2023)   PRAPARE - Administrator, Civil Service (Medical): No    Lack of Transportation (Non-Medical): No  Physical Activity: Not on file  Stress: Not on file  Social Connections: Not on file    Allergies:  Allergies  Allergen Reactions   Penicillins Other (See Comments)    Childhood allergy Unknown reaction  Did it involve swelling of the face/tongue/throat, SOB, or low BP? No Did it involve sudden or severe rash/hives, skin peeling, or any reaction on the inside of your mouth or nose? No Did you need to seek medical attention at a hospital or doctor's office? No When did it last happen?      child If all above answers are "NO", may proceed with cephalosporin use.    Objective:    Vital Signs:   Temp:  [97.5 F (36.4 C)-98.6 F (37 C)] 97.5 F (36.4 C) (05/19 0855) Pulse Rate:  [44-86] 80 (05/19 0855) Resp:  [16-19] 17 (05/19 0855) BP: (106-128)/(67-98) 128/98 (05/19 0855) SpO2:  [90 %-100 %] 97 % (05/19 0855)   Weight change: Filed Weights   08/03/23 1018  Weight: 111.6 kg   Intake/Output:  Intake/Output Summary (Last 24 hours) at 08/05/2023 0906 Last data filed at 08/04/2023 1557 Gross per 24 hour  Intake 326.66 ml  Output --  Net 326.66 ml    Physical  Exam    General: Well appearing. No distress on RA Cardiac: JVP flat. S1 and S2 present. No murmurs or rub. Extremities: Warm and dry.  No peripheral edema.  Neuro: Alert and oriented x3. Affect pleasant. Moves all extremities without difficulty.  Telemetry   SR in 90s, high PVC burden (~20%), NSVT 14beat run this morning (personally reviewed)  EKG    SR in 98 bpm with polymorphic PVCs (personally reviewed)  Labs   Basic Metabolic Panel: Recent Labs  Lab 08/03/23 1025 08/04/23 1802 08/05/23 0446  NA 136 137 133*  K 3.9 4.1 4.2  CL 99 96* 97*  CO2 25 28 25   GLUCOSE 191* 117* 108*  BUN 12 12 16   CREATININE 1.17 1.21 1.04  CALCIUM  8.9 9.3 8.9  MG  --   --  2.1  PHOS  --   --  3.8   Liver Function Tests: Recent Labs  Lab 08/05/23 0446  AST 18  ALT 15  ALKPHOS 64  BILITOT 5.6*  PROT 8.1  ALBUMIN 4.0   CBC: Recent Labs  Lab 08/03/23 1025 08/04/23 0850 08/05/23 0446  WBC 13.4* 15.3* 18.7*  HGB 16.5 16.2 16.7  HCT 48.2 48.4 48.9  MCV 89.8 90.1 90.6  PLT 202 198 200  BNP (last 3 results) Recent Labs    08/03/23 1028  BNP 892.8*   Imaging   VAS US  LOWER EXTREMITY VENOUS (DVT) Result Date: 08/04/2023  Lower Venous DVT Study Patient Name:  Reginald Fernandez  Date of Exam:   08/04/2023 Medical Rec #: [id number]       Accession #:    [id number] Date of Birth: 1980/03/31       Patient Gender: M Patient Age:   38 years Exam Location:  [facility] Procedure:      VAS US  LOWER EXTREMITY VENOUS (DVT) Referring Phys: Arne Langdon --------------------------------------------------------------------------------  Indications: Pulmonary embolism.  Risk Factors: PE/DVT (2024) - patient stopped taking Eliquis  2 months ago. Comparison Study: Previous exam on 06/05/2022 was positive for DVT - RT PopV & LT                   FV and PTV. Performing Technologist: Arlyce Berger RVT, RDMS  Examination Guidelines: A complete evaluation includes B-mode imaging, spectral Doppler, color  Doppler, and power Doppler as needed of all accessible portions of each vessel. Bilateral testing is considered an integral part of a complete examination. Limited examinations for reoccurring indications may be performed as noted. The reflux portion of the exam is performed with the patient in reverse Trendelenburg.  +---------+---------------+---------+-----------+----------+--------------+ RIGHT    CompressibilityPhasicitySpontaneityPropertiesThrombus Aging +---------+---------------+---------+-----------+----------+--------------+ CFV      Full           No       Yes                                 +---------+---------------+---------+-----------+----------+--------------+ SFJ      Full                                                        +---------+---------------+---------+-----------+----------+--------------+ FV Prox  Full           No       Yes                                 +---------+---------------+---------+-----------+----------+--------------+ FV Mid   Full           No       Yes                                 +---------+---------------+---------+-----------+----------+--------------+ FV DistalFull           No       Yes                                 +---------+---------------+---------+-----------+----------+--------------+ PFV      Full                                                        +---------+---------------+---------+-----------+----------+--------------+ POP      Full           No  Yes                                 +---------+---------------+---------+-----------+----------+--------------+ PTV      Full                                                        +---------+---------------+---------+-----------+----------+--------------+ PERO     Full                                                        +---------+---------------+---------+-----------+----------+--------------+ diminished, pulsatile flow  throughout extremity  +---------+---------------+---------+-----------+----------+-----------------+ LEFT     CompressibilityPhasicitySpontaneityPropertiesThrombus Aging    +---------+---------------+---------+-----------+----------+-----------------+ CFV      Full           No       Yes                                    +---------+---------------+---------+-----------+----------+-----------------+ SFJ      Full                                                           +---------+---------------+---------+-----------+----------+-----------------+ FV Prox  Full           No       Yes                                    +---------+---------------+---------+-----------+----------+-----------------+ FV Mid   Full           No       Yes                                    +---------+---------------+---------+-----------+----------+-----------------+ FV DistalFull           No       Yes                                    +---------+---------------+---------+-----------+----------+-----------------+ PFV      Full                                                           +---------+---------------+---------+-----------+----------+-----------------+ POP      Full           No       Yes                                    +---------+---------------+---------+-----------+----------+-----------------+  PTV      None           No       No                   Age Indeterminate +---------+---------------+---------+-----------+----------+-----------------+ PERO     Full                                                           +---------+---------------+---------+-----------+----------+-----------------+ pulsatile flow throughout extremity   Summary: BILATERAL: -No evidence of popliteal cyst, bilaterally. RIGHT: - There is no evidence of deep vein thrombosis in the lower extremity.  LEFT: - Findings consistent with age indeterminate deep vein thrombosis involving the  left posterior tibial veins.   *See table(s) above for measurements and observations.    Preliminary    ECHOCARDIOGRAM COMPLETE Result Date: 08/04/2023    ECHOCARDIOGRAM REPORT   Patient Name:   Reginald Fernandez Date of Exam: 08/04/2023 Medical Rec #:  [id number]      Height:       74.0 in Accession #:    [id number]     Weight:       246.0 lb Date of Birth:  03-09-1981      BSA:          2.373 m Patient Age:    43 years       BP:           106/75 mmHg Patient Gender: M              HR:           96 bpm. Exam Location:  Inpatient Procedure: 2D Echo, Cardiac Doppler, Color Doppler and Intracardiac            Opacification Agent (Both Spectral and Color Flow Doppler were            utilized during procedure). Indications:    Pulmonary Embolus  History:        Patient has prior history of Echocardiogram examinations, most                 recent 06/04/2022. Risk Factors:Hypertension.  Sonographer:    Janette Medley Referring Phys: [phone] A CALDWELL POWELL JR IMPRESSIONS  1. Left ventricular ejection fraction, by estimation, is <20%. The left ventricle has severely decreased function. The left ventricle demonstrates global hypokinesis. The left ventricular internal cavity size was severely dilated. Left ventricular diastolic parameters are indeterminate.  2. Right ventricular systolic function is severely reduced. The right ventricular size is severely enlarged. There is mildly elevated pulmonary artery systolic pressure. The estimated right ventricular systolic pressure is 40.4 mmHg.  3. Left atrial size was severely dilated.  4. Right atrial size was severely dilated.  5. The mitral valve is normal in structure. Trivial mitral valve regurgitation. No evidence of mitral stenosis.  6. The aortic valve is tricuspid. There is mild calcification of the aortic valve. Aortic valve regurgitation is not visualized. No aortic stenosis is present.  7. The inferior vena cava is dilated in size with <50% respiratory variability,  suggesting right atrial pressure of 15 mmHg. FINDINGS  Left Ventricle: Left ventricular ejection fraction, by estimation, is <20%. The left ventricle has severely decreased function. The left ventricle demonstrates global hypokinesis. The left ventricular internal cavity size  was severely dilated. There is no left ventricular hypertrophy. Left ventricular diastolic parameters are indeterminate. Right Ventricle: The right ventricular size is severely enlarged. No increase in right ventricular wall thickness. Right ventricular systolic function is severely reduced. There is mildly elevated pulmonary artery systolic pressure. The tricuspid regurgitant velocity is 2.52 m/s, and with an assumed right atrial pressure of 15 mmHg, the estimated right ventricular systolic pressure is 40.4 mmHg. Left Atrium: Left atrial size was severely dilated. Right Atrium: Right atrial size was severely dilated. Pericardium: There is no evidence of pericardial effusion. Mitral Valve: The mitral valve is normal in structure. Trivial mitral valve regurgitation. No evidence of mitral valve stenosis. Tricuspid Valve: The tricuspid valve is normal in structure. Tricuspid valve regurgitation is trivial. No evidence of tricuspid stenosis. Aortic Valve: The aortic valve is tricuspid. There is mild calcification of the aortic valve. Aortic valve regurgitation is not visualized. No aortic stenosis is present. Pulmonic Valve: The pulmonic valve was normal in structure. Pulmonic valve regurgitation is trivial. No evidence of pulmonic stenosis. Aorta: The aortic root is normal in size and structure. Venous: The inferior vena cava is dilated in size with less than 50% respiratory variability, suggesting right atrial pressure of 15 mmHg. IAS/Shunts: No atrial level shunt detected by color flow Doppler.  LEFT VENTRICLE PLAX 2D LVIDd:         6.60 cm   Diastology LVIDs:         5.90 cm   LV e' lateral: 7.77 cm/s LV PW:         1.10 cm LV IVS:        1.10  cm LVOT diam:     2.10 cm LV SV:         50 LV SV Index:   21 LVOT Area:     3.46 cm  RIGHT VENTRICLE             IVC RV S prime:     11.00 cm/s  IVC diam: 2.80 cm TAPSE (M-mode): 2.8 cm LEFT ATRIUM              Index        RIGHT ATRIUM           Index LA diam:        4.20 cm  1.77 cm/m   RA Area:     28.70 cm LA Vol (A2C):   113.0 ml 47.61 ml/m  RA Volume:   98.50 ml  41.50 ml/m LA Vol (A4C):   106.0 ml 44.66 ml/m LA Biplane Vol: 114.0 ml 48.03 ml/m  AORTIC VALVE LVOT Vmax:   101.00 cm/s LVOT Vmean:  63.700 cm/s LVOT VTI:    0.145 m  AORTA Ao Root diam: 3.20 cm Ao Asc diam:  3.50 cm TRICUSPID VALVE TR Peak grad:   25.4 mmHg TR Vmax:        252.00 cm/s  SHUNTS Systemic VTI:  0.14 m Systemic Diam: 2.10 cm Jules Oar MD Electronically signed by Jules Oar MD Signature Date/Time: 08/04/2023/3:42:15 PM    Final    Medications:    Current Medications:  aspirin  EC  81 mg Oral Daily   atorvastatin   80 mg Oral q1800   azithromycin   500 mg Oral Daily   carvedilol   6.25 mg Oral BID WC   dapagliflozin  propanediol  10 mg Oral Daily   digoxin   0.125 mg Oral Daily   ezetimibe   10 mg Oral Daily   furosemide   40 mg Intravenous  BID   guaiFENesin   600 mg Oral BID   pantoprazole   40 mg Oral Daily   sacubitril -valsartan   1 tablet Oral BID   Infusions:  cefTRIAXone  (ROCEPHIN )  IV 1 g (08/05/23 0557)   heparin  2,000 Units/hr (08/05/23 0600)    Patient Profile   Reginald Fernandez is a 43 y.o. male w/ h/o premature CAD s/p multiple MIs, initial inferior STEMI in 2011, HTN, HLD, tobacco use and poor compliance.   Assessment/Plan   1. Chronic systolic CHF: Ischemic cardiomyopathy. ?Component of NICM d/t HTN +/- PVCs. Echo 7/23 EF < 20%, RV severely reduced. [facility] 7/23 demonstrated diffuse CAD but no severe stenosis to explain worsening of his EF. RHC mildly elevated PCWP w/ pulmonary venous hypertension, normal RA pressure and preserved CO. Most recent eco 3/24 EF < 20%, RV severely reduced  (unchanged from prior).  - Has been off HF medications prior to admission. Compliance remains an issue. - Extremities cool on exam. PICC line and obtain Co-ox - Volume appears ok. Switch IV Lasix  to Torsemide  20 mg daily.  - Hold Entresto  for now with PNA and and rising white count - Continue Farxiga  10 mg daily - Continue Spiro 25 mg daily  - Continue digoxin  0.125 mg daily   - hold ? blocker until co-ox  - Needs ICD, however has not made appointment with referral to EP - Advanced therapies options limited by long history of noncompliance, as well as tobacco use. Will discuss with patient prior to discharge.    2. Acute DVT/PE: H/o prior PE in 3/24. Now presenting with incidental finding of minimal clot burden PE in RML and LLL. Has been off of Eliquis  for at least a month.  - Continue heparin  gtt. Will need to resume Eliqusi at discharge.   3. PNA: Noted on CTPE in [facility]. Leukocytosis. WBC up to 18. - significant pleuritic chest pain at location of PNA - start colchicine  0.6 mg daily for inflammation - check blood cultures prior to PICC placement, procal 0.11 - check ESR, CRP tomorrow morning - continue abx per primary  4. CAD: Strong family history of premature CAD.  H/o inferolateral STEMI in 9/15 with DES to LCx into OM1 and DES to RCA.  LHC 7/23 demonstrated diffuse CAD. - No chest pain.  - Continue ASA 81 mg daily +  atorvastatin  80 mg daily + Zetia  10 mg daily.  5. PVCs:  - High PVC burden; ~20% on tele - having short runs of NSVT - start mexiletine 200 mg bid - hold ? blocker, until coox obtained to rule out low output given exam findings.   6. HTN: Controlled. - GDMT as above.  7. Fatty liver disease: Noted on prior CT abdomen 3/24.   Length of Stay: 2  [place] Lee, NP  08/05/2023, 9:06 AM  Advanced Heart Failure Team Pager [phone] (M-F; 7a - 5p)  Please contact [facility] for night-coverage after hours (4p -7a ) and weekends on [email or web address]  Patient seen and  examined with the above-signed Advanced Practice Provider and/or Housestaff. I personally reviewed laboratory data, imaging studies and relevant notes. I independently examined the patient and formulated the important aspects of the plan. I have edited the note to reflect any of my changes or salient points. I have personally discussed the plan with the patient and/or family.  43 y/o male with severe systolic HF due to NICM. Has been noncompliant with HF f/u (last seen 5/24)   Admitted with CP and SOB   CT  chest reviewed. Large RUL PNA. Very small distal PE   C/o severe pleuritic CP. Hstrop and ECG negative. Frequent PVCs  General:  Sitting up leaning over bed No resp difficulty HEENT: normal Neck: supple. no JVD. Carotids 2+ bilat; no bruits. No lymphadenopathy or thryomegaly appreciated. Cor:  Regular rate & rhythm. No rubs, gallops or murmurs. Lungs: crackles on R Abdomen: obese soft, nontender, nondistended. No hepatosplenomegaly. No bruits or masses. Good bowel sounds. Extremities: no cyanosis, clubbing, rash, edema Neuro: alert & orientedx3, cranial nerves grossly intact. moves all 4 extremities w/o difficulty. Affect pleasant  Suspect main issue is his PNA with related pleuritic CP. Has severe biventricular dysfunction on echo but does not appear overtly decompensated currently though habitus makes evaluation difficult.   Would continue abx. Start colchicine . Place PICC   Doubt PEs are the issue here but agree with anticoagulation.   Jules Oar, MD  5:20 PM

## 2023-08-05 NOTE — Plan of Care (Signed)
   Problem: Education: Goal: Knowledge of General Education information will improve Description Including pain rating scale, medication(s)/side effects and non-pharmacologic comfort measures Outcome: Progressing   Problem: Health Behavior/Discharge Planning: Goal: Ability to manage health-related needs will improve Outcome: Progressing

## 2023-08-06 DIAGNOSIS — I2609 Other pulmonary embolism with acute cor pulmonale: Secondary | ICD-10-CM

## 2023-08-06 DIAGNOSIS — I2699 Other pulmonary embolism without acute cor pulmonale: Secondary | ICD-10-CM | POA: Diagnosis not present

## 2023-08-06 LAB — COMPREHENSIVE METABOLIC PANEL WITH GFR
ALT: 15 U/L (ref 0–44)
AST: 18 U/L (ref 15–41)
Albumin: 3.5 g/dL (ref 3.5–5.0)
Alkaline Phosphatase: 63 U/L (ref 38–126)
Anion gap: 9 (ref 5–15)
BUN: 16 mg/dL (ref 6–20)
CO2: 27 mmol/L (ref 22–32)
Calcium: 8.7 mg/dL — ABNORMAL LOW (ref 8.9–10.3)
Chloride: 96 mmol/L — ABNORMAL LOW (ref 98–111)
Creatinine, Ser: 0.98 mg/dL (ref 0.61–1.24)
GFR, Estimated: >60 mL/min (ref >60)
Glucose, Bld: 132 mg/dL — ABNORMAL HIGH (ref 70–99)
Potassium: 3.7 mmol/L (ref 3.5–5.1)
Sodium: 132 mmol/L — ABNORMAL LOW (ref 135–145)
Total Bilirubin: 3.6 mg/dL — ABNORMAL HIGH (ref 0.0–1.2)
Total Protein: 7.4 g/dL (ref 6.5–8.1)

## 2023-08-06 LAB — CBC
HCT: 44.8 % (ref 39.0–52.0)
Hemoglobin: 14.9 g/dL (ref 13.0–17.0)
MCH: 29.8 pg (ref 26.0–34.0)
MCHC: 33.3 g/dL (ref 30.0–36.0)
MCV: 89.6 fL (ref 80.0–100.0)
Platelets: 195 10*3/uL (ref 150–400)
RBC: 5 MIL/uL (ref 4.22–5.81)
RDW: 13.2 % (ref 11.5–15.5)
WBC: 14.9 10*3/uL — ABNORMAL HIGH (ref 4.0–10.5)
nRBC: 0 % (ref 0.0–0.2)

## 2023-08-06 LAB — SURGICAL PCR SCREEN
MRSA, PCR: NEGATIVE
Staphylococcus aureus: NEGATIVE

## 2023-08-06 LAB — COOXEMETRY PANEL
Carboxyhemoglobin: 1.3 % (ref 0.5–1.5)
Carboxyhemoglobin: 1.4 % (ref 0.5–1.5)
Methemoglobin: <0.7 % (ref 0.0–1.5)
Methemoglobin: <0.7 % (ref 0.0–1.5)
O2 Saturation: 49.6 %
O2 Saturation: 62.9 %
Total hemoglobin: 15.4 g/dL (ref 12.0–16.0)
Total hemoglobin: 14.3 g/dL (ref 12.0–16.0)

## 2023-08-06 LAB — C-REACTIVE PROTEIN: CRP: 19.8 mg/dL — ABNORMAL HIGH (ref <1.0)

## 2023-08-06 LAB — SEDIMENTATION RATE: Sed Rate: 36 mm/h — ABNORMAL HIGH (ref 0–16)

## 2023-08-06 LAB — HEPARIN LEVEL (UNFRACTIONATED): Heparin Unfractionated: 0.41 [IU]/mL (ref 0.30–0.70)

## 2023-08-06 MED ORDER — SPIRONOLACTONE 12.5 MG HALF TABLET: 12.5000 mg | ORAL_TABLET | Freq: Every day | ORAL | Status: DC

## 2023-08-06 MED ORDER — MEXILETINE HCL 150 MG PO CAPS
300.0000 mg | ORAL_CAPSULE | Freq: Two times a day (BID) | ORAL | Status: DC
  Administered 2023-08-06 – 2023-08-10 (×8): 300 mg via ORAL
  Filled 2023-08-06 (×8): qty 2

## 2023-08-06 MED ORDER — MEXILETINE HCL 250 MG PO CAPS: 250.0000 mg | ORAL_CAPSULE | Freq: Two times a day (BID) | ORAL | Status: DC

## 2023-08-06 MED ORDER — SACUBITRIL-VALSARTAN 24-26 MG PO TABS
1.0000 | ORAL_TABLET | Freq: Two times a day (BID) | ORAL | Status: DC
  Administered 2023-08-06 – 2023-08-07 (×4): 1 via ORAL
  Filled 2023-08-06 (×4): qty 1

## 2023-08-06 MED ORDER — SPIRONOLACTONE 25 MG PO TABS
25.0000 mg | ORAL_TABLET | Freq: Every day | ORAL | Status: DC
  Administered 2023-08-06 – 2023-08-10 (×5): 25 mg via ORAL
  Filled 2023-08-06 (×5): qty 1

## 2023-08-06 MED ORDER — MEXILETINE HCL 150 MG PO CAPS: 300.0000 mg | ORAL_CAPSULE | Freq: Two times a day (BID) | ORAL | Status: DC

## 2023-08-06 NOTE — Progress Notes (Signed)
 PHARMACY - ANTICOAGULATION CONSULT NOTE  Pharmacy Consult for heparin    Indication: pulmonary embolus  Allergies  Allergen Reactions   Penicillins Other (See Comments)    Childhood allergy Unknown reaction  Did it involve swelling of the face/tongue/throat, SOB, or low BP? No Did it involve sudden or severe rash/hives, skin peeling, or any reaction on the inside of your mouth or nose? No Did you need to seek medical attention at a hospital or doctor's office? No When did it last happen?      child If all above answers are "NO", may proceed with cephalosporin use.    Patient Measurements: Height: 6\' 2"  (188 cm) Weight: 117.8 kg (259 lb 12.8 oz) (Scale a) IBW/kg (Calculated) : 82.2 HEPARIN  DW (KG): 105.4  Vital Signs: Temp: 97.7 F (36.5 C) (05/20 0413) Temp Source: Oral (05/20 0413) BP: 153/97 (05/20 0413) Pulse Rate: 89 (05/20 0413)  Labs: Recent Labs    08/03/23 1025 08/03/23 1225 08/03/23 2156 08/04/23 0850 08/04/23 1802 08/05/23 0446 08/06/23 0405  HGB 16.5  --   --  16.2  --  16.7 14.9  HCT 48.2  --   --  48.4  --  48.9 44.8  PLT 202  --   --  198  --  200 195  HEPARINUNFRC  --   --    < > 0.43 0.30 0.34 0.41  CREATININE 1.17  --   --   --  1.21 1.04 0.98  TROPONINIHS 30* 29*  --   --   --   --   --    < > = values in this interval not displayed.    Estimated Creatinine Clearance: 132.5 mL/min (by C-G formula based on SCr of 0.98 mg/dL).   Medical History: Past Medical History:  Diagnosis Date   CAD S/P percutaneous coronary angioplasty 11/20/2013   a. 09/2009: EKG with Inf STEMI - no obstructive CAD;  b. 11/2013 Inflat STEMI/PCI: LM nl, LAD 20p, D1 sm - diff dzs, D2 large - nl, LCX 95-99, OM1 100 (3.5x38 Promus Premier DES), RCA 95-46m, 80d (3.0x20 and 3.0x16 Promus Premier DES') - normal EF;  c. NSTEMI 5/18 - patent stents, otw minimal CAD.- EF by Echo 35-40%; d. NSTEMI 10/2018 - ? culprit - Small branch of  D2,Med Rx,.  EF by Echo ~25%   Cardiomyopathy,  ischemic 07/2016   h/o Inf STEMI 11/2013 - EF was "Normal"; b) NSTEMI 07/2016 (NO Cultprit on Cath) - Echo EF 35-40% (Basal Ant-Lat HK, basal-mid Inferolateral & apical Akinesis); c) NSTEMI 10/2018 -felt to be occluded small branch of D2, Echo EF further decreased to 20-25%.    Essential hypertension    Hyperlipidemia with target LDL less than 100    Marijuana abuse    Migraine    Morbid obesity (HCC)    ST-segment elevation myocardial infarction (STEMI) of inferior wall (HCC) 11/20/2013   Assessment: Patient presenting with chest tightness, found to have PE w/o RHS. Hx of PE supposed to be taking Eliquis  however has not taken for approximately 1 month, pt reports wanting to trial off of medication. Pharmacy consulted to dose heparin .   Heparin  level this morning therapeutic at 0.4 on heparin  infusion at 2000 units/hr. Hgb 15 stable, plt 198. No infusion issues. Pulmonary hemorrhage noted on CT will monitor and keep heparin  level on lower end of goal.    Goal of Therapy:  Heparin  level 0.3-0.5 Monitor platelets by anticoagulation protocol: Yes   Plan:  Continue heparin  infusion 2000 units/hr  Daily cbc and heparin  level  Monitor s/s sx of bleeding    Hortensia Ma Pharm.D. CPP, BCPS Clinical Pharmacist [phone] 08/06/2023 7:35 AM

## 2023-08-06 NOTE — Plan of Care (Signed)
  Problem: Education: Goal: Knowledge of General Education information will improve Description: Including pain rating scale, medication(s)/side effects and non-pharmacologic comfort measures Outcome: Progressing   Problem: Clinical Measurements: Goal: Will remain free from infection Outcome: Progressing Goal: Diagnostic test results will improve Outcome: Progressing Goal: Respiratory complications will improve Outcome: Progressing Goal: Cardiovascular complication will be avoided Outcome: Progressing   Problem: Activity: Goal: Risk for activity intolerance will decrease Outcome: Progressing   Problem: Nutrition: Goal: Adequate nutrition will be maintained Outcome: Progressing   Problem: Elimination: Goal: Will not experience complications related to bowel motility Outcome: Progressing Goal: Will not experience complications related to urinary retention Outcome: Progressing   Problem: Pain Managment: Goal: General experience of comfort will improve and/or be controlled Outcome: Progressing   Problem: Safety: Goal: Ability to remain free from injury will improve Outcome: Progressing   Problem: Skin Integrity: Goal: Risk for impaired skin integrity will decrease Outcome: Progressing

## 2023-08-06 NOTE — Progress Notes (Addendum)
 [name]  HYQ:[id number] DOB: 1980-07-31 DOA: 08/03/2023 PCP: Lawrance Presume, MD  Chief Complaint  Patient presents with   Shortness of Breath   Cough    Brief Narrative:   Reginald Fernandez is Reginald Fernandez 43 y.o. male with medical history significant of CAD s/p PCI, HTN, HLD, and morbid obesity p/w SOB/DOE 2/2 RML PE/LLL PE as well as RLL CAP.   HF team following, he's on milrinone .   Assessment & Plan:   Principal Problem:   Pulmonary emboli (HCC) Active Problems:   Pneumonia of right lower lobe due to infectious organism  Pleuritic Chest Pain  Due to pulmonary embolism Repeat CT with segmental PE in lateral segment R middle lobe and posterior basal segment LLL.  RML airspace opacity from pulmonary hemorrhage.  Faint GGO in RLL, pneumonia not excluded.  Continue pain regimen, colchicine  added per cards.  Bowel regimen.   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  With recurrence, likely warrants indefinite anticoagulation.  CT with minimal segmental R middle and LLL pulmonary emboli with minimal clot burden LE US  pending Echo with EF <20%, RVSF severely reduced (this appears similar to previous echo 05/2022) Heparin  gtt Will transition back to eliquis  once stable.  Will need education regarding his need for indefinite anticoagulation (given this recurrence of VTE)  Possible Pneumonia Leukocytosis Afebrile. Follow MRSA PCR (negative), urine strep, urine legionella.  Sputum cx if able to collect. Blood cultures per HF team Negative covid, flu, RSV Continue abx, will plan for 5 day course.   HFrEF Echo as noted above Appreciate HF team assistance Digoxin , farxiga , spironolactone . Entresto  per cards.  Beta blocker on hold per cards. Torsemide  Currently on milrinone  per cardiology.   He needs  ICD.    PVC's Started on mexiletine per cards Beta blocker currently on hold  Needs EP eval for ICD+/- PVC ablation  CAD ASA, statin, zetia    Fatty Liver Dz Noted  Tobacco Abuse Encourage cessation    DVT prophylaxis: heparin  gtt Code Status: full Family Communication: none Disposition:   Status is: Inpatient Remains inpatient appropriate because: need for ongoing inpatient    Consultants:  none  Procedures:  Echo IMPRESSIONS     1. Left ventricular ejection fraction, by estimation, is <20%. The left  ventricle has severely decreased function. The left ventricle demonstrates  global hypokinesis. The left ventricular internal cavity size was severely  dilated. Left ventricular  diastolic parameters are indeterminate.   2. Right ventricular systolic function is severely reduced. The right  ventricular size is severely enlarged. There is mildly elevated pulmonary  artery systolic pressure. The estimated right ventricular systolic  pressure is 40.4 mmHg.   3. Left atrial size was severely dilated.   4. Right atrial size was severely dilated.   5. The mitral valve is normal in structure. Trivial mitral valve  regurgitation. No evidence of mitral stenosis.   6. The aortic valve is tricuspid. There is mild calcification of the  aortic valve. Aortic valve regurgitation is not visualized. No aortic  stenosis is present.   7. The inferior vena cava is dilated in size with <50% respiratory  variability, suggesting right atrial pressure of 15 mmHg.     Antimicrobials:  Anti-infectives (From admission, onward)    Start  Dose/Rate Route Frequency Ordered Stop   08/06/23 1000  azithromycin  (ZITHROMAX ) tablet 500 mg        500 mg Oral Daily 08/05/23 1310 08/08/23 0959   08/04/23 1000  azithromycin  (ZITHROMAX ) tablet 500 mg        500 mg Oral Daily 08/03/23 1614 08/05/23 0942   08/04/23 0600  cefTRIAXone  (ROCEPHIN ) 1 g in sodium chloride  0.9 % 100 mL IVPB        1  g 200 mL/hr over 30 Minutes Intravenous Every 24 hours 08/03/23 1614 08/08/23 0559   08/03/23 1515  cefTRIAXone  (ROCEPHIN ) 1 g in sodium chloride  0.9 % 100 mL IVPB        1 g 200 mL/hr over 30 Minutes Intravenous  Once 08/03/23 1505 08/03/23 1646   08/03/23 1515  azithromycin  (ZITHROMAX ) 500 mg in sodium chloride  0.9 % 250 mL IVPB        500 mg 250 mL/hr over 60 Minutes Intravenous  Once 08/03/23 1505 08/03/23 2106       Subjective: Pain better  Objective: Vitals:   08/06/23 0413 08/06/23 0755 08/06/23 1021 08/06/23 1145  BP: (!) 153/97 121/84  (!) 141/106  Pulse: 89  91   Resp: 19 19  20   Temp: 97.7 F (36.5 C) 97.7 F (36.5 C)  97.8 F (36.6 C)  TempSrc: Oral Oral  Oral  SpO2: 98% 98%  97%  Weight:      Height:        Intake/Output Summary (Last 24 hours) at 08/06/2023 1203 Last data filed at 08/06/2023 1100 Gross per 24 hour  Intake 1298.86 ml  Output 1600 ml  Net -301.14 ml   Filed Weights   08/03/23 1018 08/06/23 0400  Weight: 111.6 kg 117.8 kg    Examination:  General: No acute distress. Sitting in chair, comfortable. Cardiovascular: Heart sounds show Reginald Fernandez regular rate, and rhythm.  Lungs: Clear to auscultation bilaterally  Abdomen: Soft, nontender, nondistended. Neurological: Alert and oriented 3. Moves all extremities 4 with equal strength. Cranial nerves II through XII grossly intact. Extremities: No clubbing or cyanosis. No edema.    Data Reviewed: I have personally reviewed following labs and imaging studies  CBC: Recent Labs  Lab 08/03/23 1025 08/04/23 0850 08/05/23 0446 08/06/23 0405  WBC 13.4* 15.3* 18.7* 14.9*  HGB 16.5 16.2 16.7 14.9  HCT 48.2 48.4 48.9 44.8  MCV 89.8 90.1 90.6 89.6  PLT 202 198 200 195    Basic Metabolic Panel: Recent Labs  Lab 08/03/23 1025 08/04/23 1802 08/05/23 0446 08/06/23 0405  NA 136 137 133* 132*  K 3.9 4.1 4.2 3.7  CL 99 96* 97* 96*  CO2 25 28 25 27   GLUCOSE 191* 117* 108* 132*  BUN 12 12 16 16    CREATININE 1.17 1.21 1.04 0.98  CALCIUM  8.9 9.3 8.9 8.7*  MG  --   --  2.1  --   PHOS  --   --  3.8  --     GFR: Estimated Creatinine Clearance: 132.5 mL/min (by C-G formula based on SCr of 0.98 mg/dL).  Liver Function Tests: Recent Labs  Lab 08/05/23 0446 08/06/23 0405  AST 18 18  ALT 15 15  ALKPHOS 64 63  BILITOT 5.6* 3.6*  PROT 8.1 7.4  ALBUMIN 4.0 3.5    CBG: No results for input(s): "GLUCAP" in the last 168 hours.   Recent Results (from the past 240 hours)  Resp panel by RT-PCR (RSV, Flu Otoniel Myhand&B, Covid) Nasal Mucosa  Status: None   Collection Time: 08/05/23  9:32 AM   Specimen: Nasal Mucosa; Nasal Swab  Result Value Ref Range Status   SARS Coronavirus 2 by RT PCR NEGATIVE NEGATIVE Final   Influenza Kinzlee Selvy by PCR NEGATIVE NEGATIVE Final   Influenza B by PCR NEGATIVE NEGATIVE Final    Comment: (NOTE) The Xpert Xpress SARS-CoV-2/FLU/RSV plus assay is intended as an aid in the diagnosis of influenza from Nasopharyngeal swab specimens and should not be used as Jorene Kaylor sole basis for treatment. Nasal washings and aspirates are unacceptable for Xpert Xpress SARS-CoV-2/FLU/RSV testing.  Fact Sheet for Patients: BloggerCourse.com  Fact Sheet for Healthcare Providers: SeriousBroker.it  This test is not yet approved or cleared by the [place]  FDA and has been authorized for detection and/or diagnosis of SARS-CoV-2 by FDA under an Emergency Use Authorization (EUA). This EUA will remain in effect (meaning this test can be used) for the duration of the COVID-19 declaration under Section 564(b)(1) of the Act, 21 U.S.C. section 360bbb-3(b)(1), unless the authorization is terminated or revoked.     Resp Syncytial Virus by PCR NEGATIVE NEGATIVE Final    Comment: (NOTE) Fact Sheet for Patients: BloggerCourse.com  Fact Sheet for Healthcare Providers: SeriousBroker.it  This  test is not yet approved or cleared by the [place]  FDA and has been authorized for detection and/or diagnosis of SARS-CoV-2 by FDA under an Emergency Use Authorization (EUA). This EUA will remain in effect (meaning this test can be used) for the duration of the COVID-19 declaration under Section 564(b)(1) of the Act, 21 U.S.C. section 360bbb-3(b)(1), unless the authorization is terminated or revoked.  Performed at [facility], [address] [place], [place] [address]   Culture, blood (Routine X 2) w Reflex to ID Panel     Status: None (Preliminary result)   Collection Time: 08/05/23 10:35 AM   Specimen: BLOOD  Result Value Ref Range Status   Specimen Description BLOOD SITE NOT SPECIFIED  Final   Special Requests   Final    BOTTLES DRAWN AEROBIC AND ANAEROBIC Blood Culture results may not be optimal due to an inadequate volume of blood received in culture bottles   Culture   Final    NO GROWTH < 24 HOURS Performed at [facility], [address] [place], [place] [address]    Report Status PENDING  Incomplete  Culture, blood (Routine X 2) w Reflex to ID Panel     Status: None (Preliminary result)   Collection Time: 08/05/23 10:35 AM   Specimen: BLOOD  Result Value Ref Range Status   Specimen Description BLOOD SITE NOT SPECIFIED  Final   Special Requests   Final    BOTTLES DRAWN AEROBIC AND ANAEROBIC Blood Culture results may not be optimal due to an inadequate volume of blood received in culture bottles   Culture   Final    NO GROWTH < 24 HOURS Performed at [facility], [address] [place], [place] [address]    Report Status PENDING  Incomplete  Expectorated Sputum Assessment w Gram Stain, Rflx to Resp Cult     Status: None   Collection Time: 08/05/23  3:00 PM   Specimen: Expectorated Sputum  Result Value Ref Range Status   Specimen Description EXPECTORATED SPUTUM  Final   Special Requests NONE  Final   Sputum evaluation   Final    Sputum  specimen not acceptable for testing.  Please recollect.   Gram Stain Report Called to,Read Back  By and Verified With: RN C. ACEBO [id number] @1754  FH Performed at [facility], [address] [place], [place] [address]    Report Status 08/05/2023 FINAL  Final  Surgical pcr screen     Status: None   Collection Time: 08/06/23  4:00 AM   Specimen: Nasal Mucosa; Nasal Swab  Result Value Ref Range Status   MRSA, PCR NEGATIVE NEGATIVE Final   Staphylococcus aureus NEGATIVE NEGATIVE Final    Comment: (NOTE) The Xpert SA Assay (FDA approved for NASAL specimens in patients 6 years of age and older), is one component of Cameka Rae comprehensive surveillance program. It is not intended to diagnose infection nor to guide or monitor treatment. Performed at [facility], [address], [place], [place] [address]          Radiology Studies: CT Angio Chest Pulmonary Embolism (PE) W or WO Contrast Result Date: 08/05/2023 CLINICAL DATA:  Shortness of breath and history of pulmonary embolus EXAM: CT ANGIOGRAPHY CHEST WITH CONTRAST TECHNIQUE: Multidetector CT imaging of the chest was performed using the standard protocol during bolus administration of intravenous contrast. Multiplanar CT image reconstructions and MIPs were obtained to evaluate the vascular anatomy. RADIATION DOSE REDUCTION: This exam was performed according to the departmental dose-optimization program which includes automated exposure control, adjustment of the mA and/or kV according to patient size and/or use of iterative reconstruction technique. CONTRAST:  75mL OMNIPAQUE  IOHEXOL  350 MG/ML SOLN COMPARISON:  08/03/2023 FINDINGS: Despite efforts by the technologist and patient, motion artifact is present on today's exam and could not be eliminated. This reduces exam sensitivity and specificity. Cardiovascular: Small lateral segmental right middle lobe acute pulmonary embolus with localized airspace opacity possibly from pulmonary  hemorrhage. Stable posterior basal segmental pulmonary embolus in the left lower lobe compared to 08/03/2023. No new pulmonary embolus identified. Stable moderate cardiomegaly. Aberrant right subclavian artery passes behind the esophagus. Coronary atherosclerotic calcifications. Mediastinum/Nodes: 1.0 cm right paratracheal node on image 30 series 5, previously 1.1 cm. This node is borderline enlarged. Lungs/Pleura: In addition to the airspace opacity in the lateral segment of the right middle lobe, there is some faint airspace opacity anteriorly in the right lower lobe. Upper Abdomen: Unremarkable Musculoskeletal: Mild to moderate degenerative left glenohumeral arthropathy. Mild lower thoracic spondylosis. Review of the MIP images confirms the above findings. IMPRESSION: 1. No change in appearance or distribution of segmental pulmonary emboli in the lateral segment right middle lobe and posterior basal segment left lower lobe. 2. Continued right middle lobe airspace opacity probably from pulmonary hemorrhage given the correlation with the embolus. There is also some faint ground-glass opacity in the right lower lobe and superimposed minimal right lower lobe pneumonia is not excluded. 3. Stable moderate cardiomegaly. 4. Aberrant right subclavian artery passes behind the esophagus. 5. Coronary atherosclerotic calcifications. 6. Mild to moderate degenerative left glenohumeral arthropathy. 7. Mild lower thoracic spondylosis. 8.  Aortic Atherosclerosis (ICD10-I70.0). Electronically Signed   By: Freida Jes M.D.   On: 08/05/2023 18:31   VAS US  LOWER EXTREMITY VENOUS (DVT) Result Date: 08/05/2023  Lower Venous DVT Study Patient Name:  JAMARIAN JACINTO  Date of Exam:   08/04/2023 Medical Rec #: [id number]       Accession #:    [id number] Date of Birth: June 01, 1980       Patient Gender: M Patient Age:   66 years Exam Location:  [facility] Procedure:      VAS US  LOWER EXTREMITY VENOUS (DVT) Referring Phys: Cindie Credit  MOORE --------------------------------------------------------------------------------  Indications: Pulmonary embolism.  Risk Factors: PE/DVT (2024) - patient stopped taking Eliquis  2 months ago. Comparison Study: Previous exam on 06/05/2022 was positive for DVT - RT PopV & LT                   FV and PTV. Performing Technologist: Arlyce Berger RVT, RDMS  Examination Guidelines: Casanova Schurman complete evaluation includes B-mode imaging, spectral Doppler, color Doppler, and power Doppler as needed of all accessible portions of each vessel. Bilateral testing is considered an integral part of Brissa Asante complete examination. Limited examinations for reoccurring indications may be performed as noted. The reflux portion of the exam is performed with the patient in reverse Trendelenburg.  +---------+---------------+---------+-----------+----------+--------------+ RIGHT    CompressibilityPhasicitySpontaneityPropertiesThrombus Aging +---------+---------------+---------+-----------+----------+--------------+ CFV      Full           No       Yes                                 +---------+---------------+---------+-----------+----------+--------------+ SFJ      Full                                                        +---------+---------------+---------+-----------+----------+--------------+ FV Prox  Full           No       Yes                                 +---------+---------------+---------+-----------+----------+--------------+ FV Mid   Full           No       Yes                                 +---------+---------------+---------+-----------+----------+--------------+ FV DistalFull           No       Yes                                 +---------+---------------+---------+-----------+----------+--------------+ PFV      Full                                                        +---------+---------------+---------+-----------+----------+--------------+ POP      Full           No       Yes                                  +---------+---------------+---------+-----------+----------+--------------+ PTV      Full                                                        +---------+---------------+---------+-----------+----------+--------------+  PERO     Full                                                        +---------+---------------+---------+-----------+----------+--------------+ diminished, pulsatile flow throughout extremity  +---------+---------------+---------+-----------+----------+-----------------+ LEFT     CompressibilityPhasicitySpontaneityPropertiesThrombus Aging    +---------+---------------+---------+-----------+----------+-----------------+ CFV      Full           No       Yes                                    +---------+---------------+---------+-----------+----------+-----------------+ SFJ      Full                                                           +---------+---------------+---------+-----------+----------+-----------------+ FV Prox  Full           No       Yes                                    +---------+---------------+---------+-----------+----------+-----------------+ FV Mid   Full           No       Yes                                    +---------+---------------+---------+-----------+----------+-----------------+ FV DistalFull           No       Yes                                    +---------+---------------+---------+-----------+----------+-----------------+ PFV      Full                                                           +---------+---------------+---------+-----------+----------+-----------------+ POP      Full           No       Yes                                    +---------+---------------+---------+-----------+----------+-----------------+ PTV      None           No       No                   Age Indeterminate  +---------+---------------+---------+-----------+----------+-----------------+ PERO     Full                                                           +---------+---------------+---------+-----------+----------+-----------------+  pulsatile flow throughout extremity    Summary: BILATERAL: -No evidence of popliteal cyst, bilaterally. RIGHT: - There is no evidence of deep vein thrombosis in the lower extremity.  LEFT: - Findings consistent with age indeterminate deep vein thrombosis involving the left posterior tibial veins.   *See table(s) above for measurements and observations. Electronically signed by Genny Kid MD on 08/05/2023 at 3:42:51 PM.    Final    US  EKG SITE RITE Result Date: 08/05/2023 If Site Rite image not attached, placement could not be confirmed due to current cardiac rhythm.  ECHOCARDIOGRAM COMPLETE Result Date: 08/04/2023    ECHOCARDIOGRAM REPORT   Patient Name:   NAYTHEN HEIKKILA Date of Exam: 08/04/2023 Medical Rec #:  [id number]      Height:       74.0 in Accession #:    [id number]     Weight:       246.0 lb Date of Birth:  Feb 15, 1981      BSA:          2.373 m Patient Age:    43 years       BP:           106/75 mmHg Patient Gender: M              HR:           96 bpm. Exam Location:  Inpatient Procedure: 2D Echo, Cardiac Doppler, Color Doppler and Intracardiac            Opacification Agent (Both Spectral and Color Flow Doppler were            utilized during procedure). Indications:    Pulmonary Embolus  History:        Patient has prior history of Echocardiogram examinations, most                 recent 06/04/2022. Risk Factors:Hypertension.  Sonographer:    Janette Medley Referring Phys: [phone] Netanel Yannuzzi CALDWELL POWELL JR IMPRESSIONS  1. Left ventricular ejection fraction, by estimation, is <20%. The left ventricle has severely decreased function. The left ventricle demonstrates global hypokinesis. The left ventricular internal cavity size was severely dilated. Left ventricular diastolic  parameters are indeterminate.  2. Right ventricular systolic function is severely reduced. The right ventricular size is severely enlarged. There is mildly elevated pulmonary artery systolic pressure. The estimated right ventricular systolic pressure is 40.4 mmHg.  3. Left atrial size was severely dilated.  4. Right atrial size was severely dilated.  5. The mitral valve is normal in structure. Trivial mitral valve regurgitation. No evidence of mitral stenosis.  6. The aortic valve is tricuspid. There is mild calcification of the aortic valve. Aortic valve regurgitation is not visualized. No aortic stenosis is present.  7. The inferior vena cava is dilated in size with <50% respiratory variability, suggesting right atrial pressure of 15 mmHg. FINDINGS  Left Ventricle: Left ventricular ejection fraction, by estimation, is <20%. The left ventricle has severely decreased function. The left ventricle demonstrates global hypokinesis. The left ventricular internal cavity size was severely dilated. There is no left ventricular hypertrophy. Left ventricular diastolic parameters are indeterminate. Right Ventricle: The right ventricular size is severely enlarged. No increase in right ventricular wall thickness. Right ventricular systolic function is severely reduced. There is mildly elevated pulmonary artery systolic pressure. The tricuspid regurgitant velocity is 2.52 m/s, and with an assumed right atrial pressure of 15 mmHg, the estimated right ventricular systolic pressure is 40.4 mmHg. Left Atrium: Left atrial  size was severely dilated. Right Atrium: Right atrial size was severely dilated. Pericardium: There is no evidence of pericardial effusion. Mitral Valve: The mitral valve is normal in structure. Trivial mitral valve regurgitation. No evidence of mitral valve stenosis. Tricuspid Valve: The tricuspid valve is normal in structure. Tricuspid valve regurgitation is trivial. No evidence of tricuspid stenosis. Aortic Valve:  The aortic valve is tricuspid. There is mild calcification of the aortic valve. Aortic valve regurgitation is not visualized. No aortic stenosis is present. Pulmonic Valve: The pulmonic valve was normal in structure. Pulmonic valve regurgitation is trivial. No evidence of pulmonic stenosis. Aorta: The aortic root is normal in size and structure. Venous: The inferior vena cava is dilated in size with less than 50% respiratory variability, suggesting right atrial pressure of 15 mmHg. IAS/Shunts: No atrial level shunt detected by color flow Doppler.  LEFT VENTRICLE PLAX 2D LVIDd:         6.60 cm   Diastology LVIDs:         5.90 cm   LV e' lateral: 7.77 cm/s LV PW:         1.10 cm LV IVS:        1.10 cm LVOT diam:     2.10 cm LV SV:         50 LV SV Index:   21 LVOT Area:     3.46 cm  RIGHT VENTRICLE             IVC RV S prime:     11.00 cm/s  IVC diam: 2.80 cm TAPSE (M-mode): 2.8 cm LEFT ATRIUM              Index        RIGHT ATRIUM           Index LA diam:        4.20 cm  1.77 cm/m   RA Area:     28.70 cm LA Vol (A2C):   113.0 ml 47.61 ml/m  RA Volume:   98.50 ml  41.50 ml/m LA Vol (A4C):   106.0 ml 44.66 ml/m LA Biplane Vol: 114.0 ml 48.03 ml/m  AORTIC VALVE LVOT Vmax:   101.00 cm/s LVOT Vmean:  63.700 cm/s LVOT VTI:    0.145 m  AORTA Ao Root diam: 3.20 cm Ao Asc diam:  3.50 cm TRICUSPID VALVE TR Peak grad:   25.4 mmHg TR Vmax:        252.00 cm/s  SHUNTS Systemic VTI:  0.14 m Systemic Diam: 2.10 cm Jules Oar MD Electronically signed by Jules Oar MD Signature Date/Time: 08/04/2023/3:42:15 PM    Final         Scheduled Meds:  acetaminophen   1,000 mg Oral Q8H   aspirin  EC  81 mg Oral Daily   atorvastatin   80 mg Oral q1800   azithromycin   500 mg Oral Daily   Chlorhexidine  Gluconate Cloth  6 each Topical Daily   colchicine   0.6 mg Oral Daily   dapagliflozin  propanediol  10 mg Oral Daily   digoxin   0.125 mg Oral Daily   ezetimibe   10 mg Oral Daily   guaiFENesin   600 mg Oral BID    mexiletine  200 mg Oral Q12H   pantoprazole   40 mg Oral Daily   polyethylene glycol  17 g Oral BID   sacubitril -valsartan   1 tablet Oral BID   sodium chloride  flush  10-40 mL Intracatheter Q12H   torsemide   20 mg Oral Daily   Continuous Infusions:  cefTRIAXone  (ROCEPHIN )  IV 1 g (08/06/23 0610)   heparin  2,000 Units/hr (08/06/23 0825)   milrinone  0.375 mcg/kg/min (08/06/23 1022)     LOS: 3 days    Time spent: over 30 min    Donnetta Gains, MD [facility]   To contact the attending provider between 7A-7P or the covering provider during after hours 7P-7A, please log into the web site [email or web address] and access using universal [place] password for that web site. If you do not have the password, please call the hospital operator.  08/06/2023, 12:03 PM

## 2023-08-06 NOTE — Progress Notes (Addendum)
 Advanced Heart Failure Rounding Note  Cardiologist: Randene Bustard, MD  Chief Complaint: Low output Heart Failure Subjective:    Co-ox 50. CO/CI (fick) 3.5/1.4. On Milrinone  0.25 Hypertensive overnight. PVC burden remains high (~25%).  Pain improved this morning. Sitting up in the chair. Ambulating around room.   Objective:    Weight Range: 117.8 kg Body mass index is 33.36 kg/m.   Vital Signs:   Temp:  [97.5 F (36.4 C)-98.6 F (37 C)] 97.7 F (36.5 C) (05/20 0413) Pulse Rate:  [80-95] 89 (05/20 0413) Resp:  [15-24] 19 (05/20 0413) BP: (109-153)/(68-98) 153/97 (05/20 0413) SpO2:  [97 %-100 %] 98 % (05/20 0413) Weight:  [117.8 kg] 117.8 kg (05/20 0400) Last BM Date : 08/03/23  Weight change: Filed Weights   08/03/23 1018 08/06/23 0400  Weight: 111.6 kg 117.8 kg   Intake/Output: Intake/Output Summary (Last 24 hours) at 08/06/2023 0713 Last data filed at 08/06/2023 0432 Gross per 24 hour  Intake 938.86 ml  Output 600 ml  Net 338.86 ml    Physical Exam    General: Well appearing. No distress on RA Cardiac: JVP difficult to assess. S1 and S2 present. No murmurs or rub. Resp: Lung sounds clear and equal B/L Extremities: Warm and dry.  No peripheral edema.  Neuro: Alert and oriented x3. Affect pleasant. Moves all extremities without difficulty. Lines/Devices:  RUE PICC  Telemetry   Trigeminy 90s, 20-30 PVCs/min  EKG    N/A  Labs    CBC Recent Labs    08/05/23 0446 08/06/23 0405  WBC 18.7* 14.9*  HGB 16.7 14.9  HCT 48.9 44.8  MCV 90.6 89.6  PLT 200 195   Basic Metabolic Panel Recent Labs    16/10/96 0446 08/06/23 0405  NA 133* 132*  K 4.2 3.7  CL 97* 96*  CO2 25 27  GLUCOSE 108* 132*  BUN 16 16  CREATININE 1.04 0.98  CALCIUM  8.9 8.7*  MG 2.1  --   PHOS 3.8  --    Liver Function Tests Recent Labs    08/05/23 0446 08/06/23 0405  AST 18 18  ALT 15 15  ALKPHOS 64 63  BILITOT 5.6* 3.6*  PROT 8.1 7.4  ALBUMIN 4.0 3.5   BNP  (last 3 results) Recent Labs    08/03/23 1028  BNP 892.8*   Medications:    Scheduled Medications:  acetaminophen   1,000 mg Oral Q8H   aspirin  EC  81 mg Oral Daily   atorvastatin   80 mg Oral q1800   azithromycin   500 mg Oral Daily   Chlorhexidine  Gluconate Cloth  6 each Topical Daily   colchicine   0.6 mg Oral Daily   dapagliflozin  propanediol  10 mg Oral Daily   digoxin   0.125 mg Oral Daily   ezetimibe   10 mg Oral Daily   guaiFENesin   600 mg Oral BID   mexiletine  200 mg Oral Q12H   pantoprazole   40 mg Oral Daily   polyethylene glycol  17 g Oral BID   sodium chloride  flush  10-40 mL Intracatheter Q12H   torsemide   20 mg Oral Daily    Infusions:  cefTRIAXone  (ROCEPHIN )  IV 1 g (08/06/23 0610)   heparin  2,000 Units/hr (08/06/23 0432)   milrinone  0.25 mcg/kg/min (08/06/23 0432)    PRN Medications: acetaminophen  **FOLLOWED BY** [START ON 08/10/2023] acetaminophen , melatonin, morphine  injection, oxyCODONE  **OR** oxyCODONE , polyethylene glycol, prochlorperazine , sodium chloride  flush  Patient Profile   Reginald Fernandez is a 43 y.o. male w/ h/o HFrEF,  premature CAD s/p multiple MIs, PE/DVT, HTN, HLD, fatty liver disease, tobacco use and poor compliance.   Assessment/Plan   1. Acute on Chronic systolic CHF: ICM. ?Component of NICM d/t HTN +/- PVCs. Echo 7/23 EF < 20%, RV severely reduced. [facility] 7/23 demonstrated diffuse CAD, mildly elevated PCWP w/ pulmonary venous hypertension, normal RA pressure and preserved CO.  - Echo this admission EF <20 , severely reduced RV function BAE - Has been off HF medications prior to admission. Compliance remains an issue. - Low output HF. Initial co-ox 48%. Started on Milrinone  0.25. - Coox 50%. CO/CI (fick) 3.5/1.4. Repeat coox, if remains low, increase inotrope support - CVP 4. Volume okay. Continue Torsemide  20 mg daily - Resume Entresto  24/26 mg bid with hypertension - Continue Farxiga  10 mg daily - Continue Spiro 25 mg daily  - Continue  digoxin  0.125 mg daily   - hold ? blocker with low output - Needs ICD, however has not made appointment with referral to EP - Advanced therapies options limited by long history of noncompliance, as well as tobacco use. Will discuss with patient prior to discharge.     2. Acute DVT/PE: H/o prior PE in 3/24.  - incidental finding of minimal clot burden PE in RML and LLL. Has been off of Eliquis  for at least a month.  - repeat CTPE appears more concerning for pulmonary hemorrhage then PNA - will continue heparin  gtt. Hgb stable.   3. ?PNA: Noted opacity on CTPE in RLL. Leukocytosis. WBC up to 18>15 today.  - significant pleuritic chest pain at location of PNA more concerning for hemorrhage.  - continue colchicine  0.6 mg daily; CRP 20 and ESR 36 - procal 0.11, bcx and sputum cx pending. - continue abx per primary   4. CAD: Strong family history of premature CAD.  H/o inferolateral STEMI in 9/15 with DES to LCx into OM1 and DES to RCA.  LHC 7/23 demonstrated diffuse CAD. - hs-trop 30>29. - Continue ASA 81 mg +  atorva 80 mg + Zetia  10 mg daily   5. PVCs:  - High PVC burden; 20-30/min - trigeminy on tele - continue mexiletine 200 mg bid - hold ? blocker with low output - eventually will need EP eval for ICD +/- PVC ablation   6. HTN: elevated overnight. - GDMT as above.   Length of Stay: 3  [place] Lee, NP  08/06/2023, 7:13 AM  Advanced Heart Failure Team Pager [phone] (M-F; 7a - 5p)  Please contact [facility] for night-coverage after hours (5p -7a ) and weekends on [email or web address]  Patient seen and examined with the above-signed Advanced Practice Provider and/or Housestaff. I personally reviewed laboratory data, imaging studies and relevant notes. I independently examined the patient and formulated the important aspects of the plan. I have edited the note to reflect any of my changes or salient points. I have personally discussed the plan with the patient and/or family.  Repeat CT  last night c/w PE with pulmonary infarct. Co-ox with low output. Started on milrinone   CP much improved today. CVP 4-5  General:  Sitting in chair No resp difficulty HEENT: normal Neck: supple. no JVD. Carotids 2+ bilat; no bruits. No lymphadenopathy or thryomegaly appreciated. Cor: Regular rate & rhythm. No rubs, gallops or murmurs. Lungs: clear Abdomen: soft, nontender, nondistended. No hepatosplenomegaly. No bruits or masses. Good bowel sounds. Extremities: no cyanosis, clubbing, rash, edema Neuro: alert & orientedx3, cranial nerves grossly intact. moves all 4 extremities w/o difficulty. Affect pleasant  W/u consistent with PE and pulmonary infarct. Has related low output in setting of severe chronic biventricular dysfunction. Would continue milrinone  for now and then wean. Ideally will need advanced therapies but compliance has been an issue.   Increase mexilitene to 300 bid for PVCs. If not responding will switch to amio. Hopefully will improve as milrinone  weaned and PE settles down. Will need outpatient sleep study. D/w TRH at bedside.   Jules Oar, MD  2:07 PM

## 2023-08-06 NOTE — Plan of Care (Signed)

## 2023-08-07 DIAGNOSIS — I5021 Acute systolic (congestive) heart failure: Secondary | ICD-10-CM | POA: Diagnosis not present

## 2023-08-07 DIAGNOSIS — I2694 Multiple subsegmental pulmonary emboli without acute cor pulmonale: Secondary | ICD-10-CM | POA: Diagnosis not present

## 2023-08-07 DIAGNOSIS — J189 Pneumonia, unspecified organism: Secondary | ICD-10-CM | POA: Diagnosis not present

## 2023-08-07 DIAGNOSIS — E66811 Obesity, class 1: Secondary | ICD-10-CM | POA: Insufficient documentation

## 2023-08-07 DIAGNOSIS — I700 Atherosclerosis of aorta: Secondary | ICD-10-CM

## 2023-08-07 LAB — COOXEMETRY PANEL
Carboxyhemoglobin: 1.6 % — ABNORMAL HIGH (ref 0.5–1.5)
Carboxyhemoglobin: 1.5 % (ref 0.5–1.5)
Methemoglobin: 0.9 % (ref 0.0–1.5)
Methemoglobin: 0.7 % (ref 0.0–1.5)
O2 Saturation: 63.9 %
O2 Saturation: 63.9 %
Total hemoglobin: 15.2 g/dL (ref 12.0–16.0)
Total hemoglobin: 15.4 g/dL (ref 12.0–16.0)

## 2023-08-07 LAB — HEPARIN LEVEL (UNFRACTIONATED): Heparin Unfractionated: 0.44 [IU]/mL (ref 0.30–0.70)

## 2023-08-07 LAB — COMPREHENSIVE METABOLIC PANEL WITH GFR
ALT: 15 U/L (ref 0–44)
AST: 20 U/L (ref 15–41)
Albumin: 3.2 g/dL — ABNORMAL LOW (ref 3.5–5.0)
Alkaline Phosphatase: 55 U/L (ref 38–126)
Anion gap: 11 (ref 5–15)
BUN: 15 mg/dL (ref 6–20)
CO2: 27 mmol/L (ref 22–32)
Calcium: 8.4 mg/dL — ABNORMAL LOW (ref 8.9–10.3)
Chloride: 97 mmol/L — ABNORMAL LOW (ref 98–111)
Creatinine, Ser: 0.94 mg/dL (ref 0.61–1.24)
GFR, Estimated: >60 mL/min (ref >60)
Glucose, Bld: 129 mg/dL — ABNORMAL HIGH (ref 70–99)
Potassium: 3.3 mmol/L — ABNORMAL LOW (ref 3.5–5.1)
Sodium: 135 mmol/L (ref 135–145)
Total Bilirubin: 1.6 mg/dL — ABNORMAL HIGH (ref 0.0–1.2)
Total Protein: 6.8 g/dL (ref 6.5–8.1)

## 2023-08-07 LAB — CBC WITH DIFFERENTIAL/PLATELET
Abs Immature Granulocytes: 0.05 10*3/uL (ref 0.00–0.07)
Basophils Absolute: 0.1 10*3/uL (ref 0.0–0.1)
Basophils Relative: 1 %
Eosinophils Absolute: 0.2 10*3/uL (ref 0.0–0.5)
Eosinophils Relative: 2 %
HCT: 43.2 % (ref 39.0–52.0)
Hemoglobin: 14.5 g/dL (ref 13.0–17.0)
Immature Granulocytes: 1 %
Lymphocytes Relative: 21 %
Lymphs Abs: 2.1 10*3/uL (ref 0.7–4.0)
MCH: 30.1 pg (ref 26.0–34.0)
MCHC: 33.6 g/dL (ref 30.0–36.0)
MCV: 89.8 fL (ref 80.0–100.0)
Monocytes Absolute: 1.3 10*3/uL — ABNORMAL HIGH (ref 0.1–1.0)
Monocytes Relative: 13 %
Neutro Abs: 6.3 10*3/uL (ref 1.7–7.7)
Neutrophils Relative %: 62 %
Platelets: 191 10*3/uL (ref 150–400)
RBC: 4.81 MIL/uL (ref 4.22–5.81)
RDW: 13.2 % (ref 11.5–15.5)
WBC: 10.1 10*3/uL (ref 4.0–10.5)
nRBC: 0 % (ref 0.0–0.2)

## 2023-08-07 LAB — MAGNESIUM: Magnesium: 2.1 mg/dL (ref 1.7–2.4)

## 2023-08-07 LAB — PHOSPHORUS: Phosphorus: 4 mg/dL (ref 2.5–4.6)

## 2023-08-07 MED ORDER — MORPHINE SULFATE (PF) 2 MG/ML IV SOLN: 4.0000 mg | Freq: Three times a day (TID) | INTRAVENOUS | Status: DC | PRN

## 2023-08-07 MED ORDER — MILRINONE LACTATE IN DEXTROSE 20-5 MG/100ML-% IV SOLN
0.2500 ug/kg/min | INTRAVENOUS | Status: DC
  Administered 2023-08-07 – 2023-08-08 (×2): 0.25 ug/kg/min via INTRAVENOUS
  Filled 2023-08-07 (×2): qty 100

## 2023-08-07 MED ORDER — POTASSIUM CHLORIDE CRYS ER 20 MEQ PO TBCR
40.0000 meq | EXTENDED_RELEASE_TABLET | Freq: Once | ORAL | Status: AC
  Administered 2023-08-07: 40 meq via ORAL
  Filled 2023-08-07: qty 2

## 2023-08-07 NOTE — Plan of Care (Signed)

## 2023-08-07 NOTE — Progress Notes (Signed)
 PHARMACY - ANTICOAGULATION CONSULT NOTE  Pharmacy Consult for heparin    Indication: pulmonary embolus  Allergies  Allergen Reactions   Penicillins Other (See Comments)    Childhood allergy Unknown reaction  Did it involve swelling of the face/tongue/throat, SOB, or low BP? No Did it involve sudden or severe rash/hives, skin peeling, or any reaction on the inside of your mouth or nose? No Did you need to seek medical attention at a hospital or doctor's office? No When did it last happen?      child If all above answers are "NO", may proceed with cephalosporin use.    Patient Measurements: Height: 6\' 2"  (188 cm) Weight: 118.3 kg (260 lb 13.9 oz) IBW/kg (Calculated) : 82.2 HEPARIN  DW (KG): 105.4  Vital Signs: Temp: 98 F (36.7 C) (05/21 0426) Temp Source: Oral (05/21 0426) BP: 92/71 (05/21 0426) Pulse Rate: 84 (05/21 0426)  Labs: Recent Labs    08/05/23 0446 08/06/23 0405 08/07/23 0415  HGB 16.7 14.9 14.5  HCT 48.9 44.8 43.2  PLT 200 195 191  HEPARINUNFRC 0.34 0.41 0.44  CREATININE 1.04 0.98 0.94    Estimated Creatinine Clearance: 138.4 mL/min (by C-G formula based on SCr of 0.94 mg/dL).   Medical History: Past Medical History:  Diagnosis Date   CAD S/P percutaneous coronary angioplasty 11/20/2013   a. 09/2009: EKG with Inf STEMI - no obstructive CAD;  b. 11/2013 Inflat STEMI/PCI: LM nl, LAD 20p, D1 sm - diff dzs, D2 large - nl, LCX 95-99, OM1 100 (3.5x38 Promus Premier DES), RCA 95-51m, 80d (3.0x20 and 3.0x16 Promus Premier DES') - normal EF;  c. NSTEMI 5/18 - patent stents, otw minimal CAD.- EF by Echo 35-40%; d. NSTEMI 10/2018 - ? culprit - Small branch of  D2,Med Rx,.  EF by Echo ~25%   Cardiomyopathy, ischemic 07/2016   h/o Inf STEMI 11/2013 - EF was "Normal"; b) NSTEMI 07/2016 (NO Cultprit on Cath) - Echo EF 35-40% (Basal Ant-Lat HK, basal-mid Inferolateral & apical Akinesis); c) NSTEMI 10/2018 -felt to be occluded small branch of D2, Echo EF further decreased to  20-25%.    Essential hypertension    Hyperlipidemia with target LDL less than 100    Marijuana abuse    Migraine    Morbid obesity (HCC)    ST-segment elevation myocardial infarction (STEMI) of inferior wall (HCC) 11/20/2013   Assessment: 43 yoM on apixaban  PTA for hx PE but noncompliant and not taking for ~1 month admitted with chest tightness, found to have PE. Subsequent CT noted pulmonary hemorrhage. Pharmacy to dose IV heparin .  Heparin  level therapeutic at 0.44, CBC ok.  Goal of Therapy:  Heparin  level 0.3-0.5 Monitor platelets by anticoagulation protocol: Yes   Plan:  Continue heparin  infusion 2000 units/hr  Daily CBC and heparin  level  Monitor s/s sx of bleeding     Levin Reamer, PharmD, BCPS, [facility] Clinical Pharmacist [phone] Please check AMION for all [facility] Pharmacy numbers 08/07/2023

## 2023-08-07 NOTE — Hospital Course (Addendum)
 Reginald Fernandez is a 43 y.o. male with a history of CAD s/p PCI, hypertension, hyperlipidemia, morbid obesity.  Patient presented secondary to dyspnea on exertion with evidence of acute PE and pneumonia. Patient was managed on heparin  IV and antibiotics During workup, patient with evidence of acute heart failure from known severe HFrEF. Cardiology consulted for management; patient started on diuresis and milrinone  with improvement of symptoms. Goal directed medical therapy for management of heart failure adjusted. Patient to discharge on Eliquis  as well.

## 2023-08-07 NOTE — Progress Notes (Signed)
 Advanced Heart Failure Rounding Note  Cardiologist: Randene Bustard, MD  Chief Complaint: Low output Heart Failure Subjective:    CO-OX 50% yesterday am with Fick CI 1.4. Milrinone  increased to 0.375 mcg/kg/min.  CO-OX 64% on 0.375 milrinone   CVP 1.   Dyspnea and pleuritic chest pain improved. Has been ambulating around the room.   Objective:    Weight Range: 118.3 kg Body mass index is 33.49 kg/m.   Vital Signs:   Temp:  [97.8 F (36.6 C)-98.5 F (36.9 C)] 98.3 F (36.8 C) (05/21 0739) Pulse Rate:  [67-97] 67 (05/21 0739) Resp:  [16-20] 16 (05/21 0843) BP: (92-141)/(65-106) 128/65 (05/21 0739) SpO2:  [90 %-100 %] 90 % (05/21 0739) Weight:  [118.3 kg] 118.3 kg (05/21 0426) Last BM Date : 08/07/23  Weight change: Filed Weights   08/03/23 1018 08/06/23 0400 08/07/23 0426  Weight: 111.6 kg 117.8 kg 118.3 kg   Intake/Output: Intake/Output Summary (Last 24 hours) at 08/07/2023 0955 Last data filed at 08/07/2023 0739 Gross per 24 hour  Intake 1513.61 ml  Output 600 ml  Net 913.61 ml    Physical Exam    General:  Well appearing Neck: No JVD Cor: Regular rate & rhythm. No rubs, gallops or murmurs. Lungs: clear Abdomen: soft, nontender, nondistended.  Extremities: no edema Neuro: alert & orientedx3. Affect pleasant   Telemetry   SR 90s-100s, 20-30 PVCs/min  EKG    N/A  Labs    CBC Recent Labs    08/06/23 0405 08/07/23 0415  WBC 14.9* 10.1  NEUTROABS  --  6.3  HGB 14.9 14.5  HCT 44.8 43.2  MCV 89.6 89.8  PLT 195 191   Basic Metabolic Panel Recent Labs    40/98/11 0446 08/06/23 0405 08/07/23 0415  NA 133* 132* 135  K 4.2 3.7 3.3*  CL 97* 96* 97*  CO2 25 27 27   GLUCOSE 108* 132* 129*  BUN 16 16 15   CREATININE 1.04 0.98 0.94  CALCIUM  8.9 8.7* 8.4*  MG 2.1  --  2.1  PHOS 3.8  --  4.0   Liver Function Tests Recent Labs    08/06/23 0405 08/07/23 0415  AST 18 20  ALT 15 15  ALKPHOS 63 55  BILITOT 3.6* 1.6*  PROT 7.4 6.8   ALBUMIN 3.5 3.2*   BNP (last 3 results) Recent Labs    08/03/23 1028  BNP 892.8*   Medications:    Scheduled Medications:  acetaminophen   1,000 mg Oral Q8H   aspirin  EC  81 mg Oral Daily   atorvastatin   80 mg Oral q1800   Chlorhexidine  Gluconate Cloth  6 each Topical Daily   colchicine   0.6 mg Oral Daily   dapagliflozin  propanediol  10 mg Oral Daily   digoxin   0.125 mg Oral Daily   ezetimibe   10 mg Oral Daily   guaiFENesin   600 mg Oral BID   mexiletine  300 mg Oral Q12H   pantoprazole   40 mg Oral Daily   polyethylene glycol  17 g Oral BID   sacubitril -valsartan   1 tablet Oral BID   sodium chloride  flush  10-40 mL Intracatheter Q12H   spironolactone   25 mg Oral Daily   torsemide   20 mg Oral Daily    Infusions:  heparin  2,000 Units/hr (08/07/23 0502)   milrinone  0.375 mcg/kg/min (08/07/23 9147)    PRN Medications: acetaminophen  **FOLLOWED BY** [START ON 08/10/2023] acetaminophen , melatonin, morphine  injection, oxyCODONE  **OR** oxyCODONE , polyethylene glycol, prochlorperazine , sodium chloride  flush  Patient Profile  Reginald Fernandez is a 43 y.o. male w/ h/o HFrEF, premature CAD s/p multiple MIs, PE/DVT, HTN, HLD, fatty liver disease, tobacco use and poor compliance.   Assessment/Plan   1. Acute on Chronic systolic CHF: ICM. ?Component of NICM d/t HTN +/- PVCs. Echo 7/23 EF < 20%, RV severely reduced. [facility] 7/23 demonstrated diffuse CAD, mildly elevated PCWP w/ pulmonary venous hypertension, normal RA pressure and preserved CO.  - Echo this admission EF <20 , severely reduced RV function BAE - Has been off HF medications prior to admission. Compliance remains an issue. - Low output HF. Initial co-ox 48%. Started on Milrinone  0.25. Increased to 0.375 on 05/20. - CO-OX 64% on 0.375 milrinone  - CVP down to 1 after restarting entresto . Hold Torsemide . - Continue Entresto  24/26 mg bid  - Continue Farxiga  10 mg daily - Continue Spiro 25 mg daily  - Continue digoxin  0.125 mg  daily   - hold ? blocker with low output - Needs ICD, however has not made appointment with referral to EP - Worry that he is end-stage and need to consider advanced therapies. However, options limited by long history of noncompliance and tobacco use (vaping).    2. Acute DVT/PE: H/o prior PE in 3/24.  - CT 05/17 minimal clot burden PE in RML and LLL. Had been off of Eliquis  for at least a month.  - repeat CTPE with stable clot burden and RML opacity appears more concerning for pulmonary hemorrhage then PNA - Will likely need indefinite anticoagulation, currently on heparin . Eventually Eliquis .   3. Pleuritic chest pain: Noted opacity on CTPE in RLL. Leukocytosis. WBC up to 18>15>10 today  - Findings on imaging more concerning for hemorrhage rather than PNA on follow-up imaging  - continue colchicine  0.6 mg daily; CRP 20 and ESR 36 - procal 0.11, bcx and sputum culture pending - continue abx for 5 day course per primary   4. CAD: Strong family history of premature CAD.  H/o inferolateral STEMI in 9/15 with DES to LCx into OM1 and DES to RCA.  LHC 7/23 demonstrated diffuse CAD. - hs-trop 30>29. Not ACS. - Continue ASA 81 mg +  atorva 80 mg + Zetia  10 mg daily   5. PVCs:  - Continues with high PVC burden, 20-30%, hopefully this will improve with weaning inotrope - continue mexiletine 300 mg bid - hold ? blocker with low output - eventually would need EP eval for ICD +/- PVC ablation but HF may be too far advanced for benefit   6. HTN: BP improved - GDMT as above.   Length of Stay: 4  Maudell Stanbrough N, PA-C  08/07/2023, 9:55 AM  Advanced Heart Failure Team Pager [phone] (M-F; 7a - 5p)  Please contact [facility] for night-coverage after hours (5p -7a ) and weekends on [email or web address]

## 2023-08-07 NOTE — Progress Notes (Addendum)
 [name]  XBJ:[id number] DOB: [birth year] DOA: 08/03/2023 PCP: Lawrance Presume, MD   Brief Narrative: Reginald Fernandez is a 43 y.o. male with a history of CAD s/p PCI, hypertension, hyperlipidemia, morbid obesity.  Patient presented secondary to dyspnea on exertion with evidence of acute PE and pneumonia. Patient was managed on heparin  IV and antibiotics During workup, patient with evidence of acute heart failure from known severe HFrEF. Cardiology consulted for management; patient started on diuresis and milrinone .   Assessment and Plan:  Acute pulmonary embolism Patient with pleuritic chest pain with evidence of segmental pulmonary emboli within the right middle and left lower lobes on CTA chest. Patient started on heparin  IV for management. Transthoracic Echocardiogram significant for severe RV dysfunction, however unlikely related to acute PE with level of clot burden. -Continue Heparin  IV with plan to transition to Eliquis   RLL pneumonia Right lower lobe ground-glass opacity noted on CT imaging. Patient started on Ceftriaxone  and azithromycin .  Acute HFrEF Transthoracic Echocardiogram this admission significant for an LVEF less than 20% which is stable from prior Transthoracic Echocardiogram.. Cardiology managing with diuresis and milrinone . Concern for end-stage disease. -Cardiology recommendations: milrinone , plan for RHC on 5/23 -Continue Entresto , Farxiga , spironolactone , digoxin   PVCs Patient started on mexiletine.  CAD Aortic atherosclerosis -Continue Zetia  and Lipitor   Fatty liver disease Noted.  Tobacco use Cessation discussed.  Obesity, class I Estimated body mass index is 33.49 kg/m as calculated from the following:   Height as of this encounter: 6\' 2"  (1.88 m).   Weight as of this encounter: 118.3 kg.   DVT prophylaxis: Heparin  IV Code Status:   Code Status: Full Code Family Communication: None at bedside Disposition Plan:  Discharge home pending cardiology recommendations   Consultants:  Cardiology  Procedures:  Transthoracic Echocardiogram  Antimicrobials: Ceftriaxone  IV Azithromycin     Subjective: Patient reports no dyspnea. No issues overnight. He does have some back pain and pleuritic chest pain which is improving.  Objective: BP 130/73 (BP Location: Left Arm)   Pulse 100   Temp 98.5 F (36.9 C) (Oral)   Resp 15   Ht 6\' 2"  (1.88 m)   Wt 118.3 kg   SpO2 95%   BMI 33.49 kg/m   Examination:  General exam: Appears calm and comfortable Respiratory system: Clear to auscultation. Respiratory effort normal. Cardiovascular system: S1 & S2 heard, RRR. No murmurs. Gastrointestinal system: Abdomen is nondistended, soft and nontender. Normal bowel sounds heard. Central nervous system: Alert and oriented. No focal neurological deficits. Musculoskeletal: No edema. No calf tenderness Skin: No cyanosis. No rashes Psychiatry: Judgement and insight appear normal. Mood & affect appropriate.    Data Reviewed: I have personally reviewed following labs and imaging studies  CBC Lab Results  Component Value Date   WBC 10.1 08/07/2023   RBC 4.81 08/07/2023   HGB 14.5 08/07/2023   HCT 43.2 08/07/2023   MCV 89.8 08/07/2023   MCH 30.1 08/07/2023   PLT 191 08/07/2023   MCHC 33.6 08/07/2023   RDW 13.2 08/07/2023   LYMPHSABS 2.1 08/07/2023   MONOABS 1.3 (H) 08/07/2023   EOSABS 0.2 08/07/2023   BASOSABS 0.1 08/07/2023     Last metabolic panel Lab Results  Component Value Date   NA 135 08/07/2023   K 3.3 (L) 08/07/2023   CL 97 (L) 08/07/2023   CO2 27 08/07/2023   BUN 15 08/07/2023   CREATININE 0.94 08/07/2023   GLUCOSE 129 (H) 08/07/2023   GFRNONAA >60  08/07/2023   GFRAA 141 10/31/2018   CALCIUM  8.4 (L) 08/07/2023   PHOS 4.0 08/07/2023   PROT 6.8 08/07/2023   ALBUMIN 3.2 (L) 08/07/2023   BILITOT 1.6 (H) 08/07/2023   ALKPHOS 55 08/07/2023   AST 20 08/07/2023   ALT 15 08/07/2023    ANIONGAP 11 08/07/2023    GFR: Estimated Creatinine Clearance: 138.4 mL/min (by C-G formula based on SCr of 0.94 mg/dL).  Recent Results (from the past 240 hours)  Resp panel by RT-PCR (RSV, Flu A&B, Covid) Nasal Mucosa     Status: None   Collection Time: 08/05/23  9:32 AM   Specimen: Nasal Mucosa; Nasal Swab  Result Value Ref Range Status   SARS Coronavirus 2 by RT PCR NEGATIVE NEGATIVE Final   Influenza A by PCR NEGATIVE NEGATIVE Final   Influenza B by PCR NEGATIVE NEGATIVE Final    Comment: (NOTE) The Xpert Xpress SARS-CoV-2/FLU/RSV plus assay is intended as an aid in the diagnosis of influenza from Nasopharyngeal swab specimens and should not be used as a sole basis for treatment. Nasal washings and aspirates are unacceptable for Xpert Xpress SARS-CoV-2/FLU/RSV testing.  Fact Sheet for Patients: BloggerCourse.com  Fact Sheet for Healthcare Providers: SeriousBroker.it  This test is not yet approved or cleared by the [place]  FDA and has been authorized for detection and/or diagnosis of SARS-CoV-2 by FDA under an Emergency Use Authorization (EUA). This EUA will remain in effect (meaning this test can be used) for the duration of the COVID-19 declaration under Section 564(b)(1) of the Act, 21 U.S.C. section 360bbb-3(b)(1), unless the authorization is terminated or revoked.     Resp Syncytial Virus by PCR NEGATIVE NEGATIVE Final    Comment: (NOTE) Fact Sheet for Patients: BloggerCourse.com  Fact Sheet for Healthcare Providers: SeriousBroker.it  This test is not yet approved or cleared by the [place]  FDA and has been authorized for detection and/or diagnosis of SARS-CoV-2 by FDA under an Emergency Use Authorization (EUA). This EUA will remain in effect (meaning this test can be used) for the duration of the COVID-19 declaration under Section 564(b)(1) of the Act,  21 U.S.C. section 360bbb-3(b)(1), unless the authorization is terminated or revoked.  Performed at [facility], [address], [place], [place] [address]   Culture, blood (Routine X 2) w Reflex to ID Panel     Status: None (Preliminary result)   Collection Time: 08/05/23 10:35 AM   Specimen: BLOOD  Result Value Ref Range Status   Specimen Description BLOOD SITE NOT SPECIFIED  Final   Special Requests   Final    BOTTLES DRAWN AEROBIC AND ANAEROBIC Blood Culture results may not be optimal due to an inadequate volume of blood received in culture bottles   Culture   Final    NO GROWTH 2 DAYS Performed at [facility], [address] [place], [place] [address]    Report Status PENDING  Incomplete  Culture, blood (Routine X 2) w Reflex to ID Panel     Status: None (Preliminary result)   Collection Time: 08/05/23 10:35 AM   Specimen: BLOOD  Result Value Ref Range Status   Specimen Description BLOOD SITE NOT SPECIFIED  Final   Special Requests   Final    BOTTLES DRAWN AEROBIC AND ANAEROBIC Blood Culture results may not be optimal due to an inadequate volume of blood received in culture bottles   Culture   Final    NO GROWTH 2 DAYS Performed at [facility],  [address], [place], [place] [address]    Report Status PENDING  Incomplete  Expectorated Sputum Assessment w Gram Stain, Rflx to Resp Cult     Status: None   Collection Time: 08/05/23  3:00 PM   Specimen: Expectorated Sputum  Result Value Ref Range Status   Specimen Description EXPECTORATED SPUTUM  Final   Special Requests NONE  Final   Sputum evaluation   Final    Sputum specimen not acceptable for testing.  Please recollect.   Gram Stain Report Called to,Read Back By and Verified With: RN C. ACEBO [id number] @1754  FH Performed at [facility], [address], [place], [place] [address]    Report Status 08/05/2023 FINAL  Final  Surgical pcr screen     Status: None   Collection Time: 08/06/23  4:00  AM   Specimen: Nasal Mucosa; Nasal Swab  Result Value Ref Range Status   MRSA, PCR NEGATIVE NEGATIVE Final   Staphylococcus aureus NEGATIVE NEGATIVE Final    Comment: (NOTE) The Xpert SA Assay (FDA approved for NASAL specimens in patients 78 years of age and older), is one component of a comprehensive surveillance program. It is not intended to diagnose infection nor to guide or monitor treatment. Performed at [facility], [address], [place], [place] [address]       Radiology Studies: CT Angio Chest Pulmonary Embolism (PE) W or WO Contrast Result Date: 08/05/2023 CLINICAL DATA:  Shortness of breath and history of pulmonary embolus EXAM: CT ANGIOGRAPHY CHEST WITH CONTRAST TECHNIQUE: Multidetector CT imaging of the chest was performed using the standard protocol during bolus administration of intravenous contrast. Multiplanar CT image reconstructions and MIPs were obtained to evaluate the vascular anatomy. RADIATION DOSE REDUCTION: This exam was performed according to the departmental dose-optimization program which includes automated exposure control, adjustment of the mA and/or kV according to patient size and/or use of iterative reconstruction technique. CONTRAST:  75mL OMNIPAQUE  IOHEXOL  350 MG/ML SOLN COMPARISON:  08/03/2023 FINDINGS: Despite efforts by the technologist and patient, motion artifact is present on today's exam and could not be eliminated. This reduces exam sensitivity and specificity. Cardiovascular: Small lateral segmental right middle lobe acute pulmonary embolus with localized airspace opacity possibly from pulmonary hemorrhage. Stable posterior basal segmental pulmonary embolus in the left lower lobe compared to 08/03/2023. No new pulmonary embolus identified. Stable moderate cardiomegaly. Aberrant right subclavian artery passes behind the esophagus. Coronary atherosclerotic calcifications. Mediastinum/Nodes: 1.0 cm right paratracheal node on image 30 series 5,  previously 1.1 cm. This node is borderline enlarged. Lungs/Pleura: In addition to the airspace opacity in the lateral segment of the right middle lobe, there is some faint airspace opacity anteriorly in the right lower lobe. Upper Abdomen: Unremarkable Musculoskeletal: Mild to moderate degenerative left glenohumeral arthropathy. Mild lower thoracic spondylosis. Review of the MIP images confirms the above findings. IMPRESSION: 1. No change in appearance or distribution of segmental pulmonary emboli in the lateral segment right middle lobe and posterior basal segment left lower lobe. 2. Continued right middle lobe airspace opacity probably from pulmonary hemorrhage given the correlation with the embolus. There is also some faint ground-glass opacity in the right lower lobe and superimposed minimal right lower lobe pneumonia is not excluded. 3. Stable moderate cardiomegaly. 4. Aberrant right subclavian artery passes behind the esophagus. 5. Coronary atherosclerotic calcifications. 6. Mild to moderate degenerative left glenohumeral arthropathy. 7. Mild lower thoracic spondylosis. 8.  Aortic Atherosclerosis (ICD10-I70.0). Electronically Signed   By: Tanner Fanny.D.  On: 08/05/2023 18:31      LOS: 4 days    Aneita Keens, MD [facility] 08/07/2023, 4:39 PM   If 7PM-7AM, please contact night-coverage [email or web address]

## 2023-08-08 ENCOUNTER — Other Ambulatory Visit (HOSPITAL_COMMUNITY): Payer: Self-pay

## 2023-08-08 ENCOUNTER — Telehealth: Payer: Self-pay | Admitting: Internal Medicine

## 2023-08-08 ENCOUNTER — Other Ambulatory Visit (HOSPITAL_COMMUNITY): Payer: Self-pay | Admitting: Cardiology

## 2023-08-08 DIAGNOSIS — I5021 Acute systolic (congestive) heart failure: Secondary | ICD-10-CM | POA: Diagnosis not present

## 2023-08-08 DIAGNOSIS — I493 Ventricular premature depolarization: Secondary | ICD-10-CM

## 2023-08-08 DIAGNOSIS — I2694 Multiple subsegmental pulmonary emboli without acute cor pulmonale: Secondary | ICD-10-CM | POA: Diagnosis not present

## 2023-08-08 DIAGNOSIS — I700 Atherosclerosis of aorta: Secondary | ICD-10-CM | POA: Diagnosis not present

## 2023-08-08 DIAGNOSIS — J189 Pneumonia, unspecified organism: Secondary | ICD-10-CM | POA: Diagnosis not present

## 2023-08-08 DIAGNOSIS — I38 Endocarditis, valve unspecified: Secondary | ICD-10-CM

## 2023-08-08 LAB — COOXEMETRY PANEL
Carboxyhemoglobin: 0.9 % (ref 0.5–1.5)
Carboxyhemoglobin: 0.9 % (ref 0.5–1.5)
Methemoglobin: <0.7 % (ref 0.0–1.5)
Methemoglobin: <0.7 % (ref 0.0–1.5)
O2 Saturation: 53.3 %
O2 Saturation: 61.2 %
Total hemoglobin: 15.4 g/dL (ref 12.0–16.0)
Total hemoglobin: 14.5 g/dL (ref 12.0–16.0)

## 2023-08-08 LAB — CBC
HCT: 44.3 % (ref 39.0–52.0)
Hemoglobin: 14.9 g/dL (ref 13.0–17.0)
MCH: 30.1 pg (ref 26.0–34.0)
MCHC: 33.6 g/dL (ref 30.0–36.0)
MCV: 89.5 fL (ref 80.0–100.0)
Platelets: 217 10*3/uL (ref 150–400)
RBC: 4.95 MIL/uL (ref 4.22–5.81)
RDW: 13.3 % (ref 11.5–15.5)
WBC: 9.6 10*3/uL (ref 4.0–10.5)
nRBC: 0 % (ref 0.0–0.2)

## 2023-08-08 LAB — BASIC METABOLIC PANEL WITH GFR
Anion gap: 7 (ref 5–15)
BUN: 13 mg/dL (ref 6–20)
CO2: 28 mmol/L (ref 22–32)
Calcium: 8.2 mg/dL — ABNORMAL LOW (ref 8.9–10.3)
Chloride: 101 mmol/L (ref 98–111)
Creatinine, Ser: 0.84 mg/dL (ref 0.61–1.24)
GFR, Estimated: >60 mL/min (ref >60)
Glucose, Bld: 106 mg/dL — ABNORMAL HIGH (ref 70–99)
Potassium: 3.6 mmol/L (ref 3.5–5.1)
Sodium: 136 mmol/L (ref 135–145)

## 2023-08-08 LAB — MAGNESIUM: Magnesium: 2.2 mg/dL (ref 1.7–2.4)

## 2023-08-08 LAB — HEPARIN LEVEL (UNFRACTIONATED): Heparin Unfractionated: 0.45 [IU]/mL (ref 0.30–0.70)

## 2023-08-08 MED ORDER — AMIODARONE HCL 200 MG PO TABS
400.0000 mg | ORAL_TABLET | Freq: Two times a day (BID) | ORAL | Status: DC
  Administered 2023-08-08 – 2023-08-09 (×3): 400 mg via ORAL
  Filled 2023-08-08 (×3): qty 2

## 2023-08-08 MED ORDER — APIXABAN 5 MG PO TABS
10.0000 mg | ORAL_TABLET | Freq: Two times a day (BID) | ORAL | Status: DC
  Administered 2023-08-08 – 2023-08-10 (×5): 10 mg via ORAL
  Filled 2023-08-08 (×5): qty 2

## 2023-08-08 MED ORDER — POTASSIUM CHLORIDE CRYS ER 20 MEQ PO TBCR
40.0000 meq | EXTENDED_RELEASE_TABLET | Freq: Once | ORAL | Status: AC
  Administered 2023-08-08: 40 meq via ORAL
  Filled 2023-08-08: qty 2

## 2023-08-08 MED ORDER — MILRINONE LACTATE IN DEXTROSE 20-5 MG/100ML-% IV SOLN
0.1250 ug/kg/min | INTRAVENOUS | Status: AC
Start: 2023-08-08 — End: 2023-08-08

## 2023-08-08 MED ORDER — APIXABAN 5 MG PO TABS: 5.0000 mg | ORAL_TABLET | Freq: Two times a day (BID) | ORAL | Status: DC

## 2023-08-08 MED ORDER — SACUBITRIL-VALSARTAN 49-51 MG PO TABS
1.0000 | ORAL_TABLET | Freq: Two times a day (BID) | ORAL | Status: DC
  Administered 2023-08-08 (×2): 1 via ORAL
  Filled 2023-08-08 (×3): qty 1

## 2023-08-08 NOTE — Progress Notes (Addendum)
 PHARMACY - ANTICOAGULATION CONSULT NOTE  Pharmacy Consult for heparin    Indication: pulmonary embolus  Allergies  Allergen Reactions   Penicillins Other (See Comments)    Childhood allergy Unknown reaction  Did it involve swelling of the face/tongue/throat, SOB, or low BP? No Did it involve sudden or severe rash/hives, skin peeling, or any reaction on the inside of your mouth or nose? No Did you need to seek medical attention at a hospital or doctor's office? No When did it last happen?      child If all above answers are "NO", may proceed with cephalosporin use.    Patient Measurements: Height: 6\' 2"  (188 cm) Weight: 118.2 kg (260 lb 8 oz) IBW/kg (Calculated) : 82.2 HEPARIN  DW (KG): 105.4  Vital Signs: Temp: 97.6 F (36.4 C) (05/22 0440) Temp Source: Oral (05/22 0440) BP: 132/77 (05/22 0440) Pulse Rate: 91 (05/22 0440)  Labs: Recent Labs    08/06/23 0405 08/07/23 0415 08/08/23 0450  HGB 14.9 14.5 14.9  HCT 44.8 43.2 44.3  PLT 195 191 217  HEPARINUNFRC 0.41 0.44 0.45  CREATININE 0.98 0.94 0.84    Estimated Creatinine Clearance: 154.9 mL/min (by C-G formula based on SCr of 0.84 mg/dL).   Medical History: Past Medical History:  Diagnosis Date   CAD S/P percutaneous coronary angioplasty 11/20/2013   a. 09/2009: EKG with Inf STEMI - no obstructive CAD;  b. 11/2013 Inflat STEMI/PCI: LM nl, LAD 20p, D1 sm - diff dzs, D2 large - nl, LCX 95-99, OM1 100 (3.5x38 Promus Premier DES), RCA 95-18m, 80d (3.0x20 and 3.0x16 Promus Premier DES') - normal EF;  c. NSTEMI 5/18 - patent stents, otw minimal CAD.- EF by Echo 35-40%; d. NSTEMI 10/2018 - ? culprit - Small branch of  D2,Med Rx,.  EF by Echo ~25%   Cardiomyopathy, ischemic 07/2016   h/o Inf STEMI 11/2013 - EF was "Normal"; b) NSTEMI 07/2016 (NO Cultprit on Cath) - Echo EF 35-40% (Basal Ant-Lat HK, basal-mid Inferolateral & apical Akinesis); c) NSTEMI 10/2018 -felt to be occluded small branch of D2, Echo EF further decreased to  20-25%.    Essential hypertension    Hyperlipidemia with target LDL less than 100    Marijuana abuse    Migraine    Morbid obesity (HCC)    ST-segment elevation myocardial infarction (STEMI) of inferior wall (HCC) 11/20/2013   Assessment: 43 yoM on apixaban  PTA for hx PE but noncompliant and not taking for ~1 month admitted with chest tightness, found to have PE. Subsequent CT noted pulmonary hemorrhage. Pharmacy to dose IV heparin .  Heparin  level therapeutic at 0.45, CBC ok.  Goal of Therapy:  Heparin  level 0.3-0.5 Monitor platelets by anticoagulation protocol: Yes   Plan:  Continue heparin  infusion 2000 units/hr  Daily CBC and heparin  level  Monitor s/s sx of bleeding   ADDENDUM 1025: transitioning to apixaban . Ok to load per MD, will give abbreviated load as pt has been on parenteral anticoagulation for several days. Stop heparin , begin apixaban  10mg  BID 6x doses then 5mg  BID indefinitely.    Levin Reamer, PharmD, BCPS, [facility] Clinical Pharmacist [phone] Please check AMION for all [facility] Pharmacy numbers 08/08/2023

## 2023-08-08 NOTE — TOC Progression Note (Addendum)
 Transition of Care [facility]) - Progression Note    Patient Details  Name: Reginald Fernandez MRN: [id number] Date of Birth: 13-Aug-1980  [facility] CM/SW Contact  Benjiman Bras, RN Phone Number: [phone] 08/08/2023, 3:59 PM  Clinical Narrative:     [facility] CM contacted PCP office for hospital follow up appt. They will have to search for an appt and will give pt a call. Updated pt.   Hospital follow up scheduled with [facility] 08/23/2023 at 930 am    Expected Discharge Plan: Home/Self Care Barriers to Discharge: No Barriers Identified  Expected Discharge Plan and Services   Discharge Planning Services: CM Consult   Living arrangements for the past 2 months: Single Family Home                                       Social Determinants of Health (SDOH) Interventions SDOH Screenings   Food Insecurity: No Food Insecurity (08/03/2023)  Housing: Low Risk  (08/03/2023)  Transportation Needs: No Transportation Needs (08/03/2023)  Utilities: Not At Risk (08/03/2023)  Depression (PHQ2-9): Low Risk  (06/27/2022)  Tobacco Use: Medium Risk (08/03/2023)    Readmission Risk Interventions     No data to display

## 2023-08-08 NOTE — Progress Notes (Signed)
 [name]  ION:[id number] DOB: 1980-08-17 DOA: 08/03/2023 PCP: Lawrance Presume, MD   Brief Narrative: Reginald KROTZER is a 43 y.o. male with a history of CAD s/p PCI, hypertension, hyperlipidemia, morbid obesity.  Patient presented secondary to dyspnea on exertion with evidence of acute PE and pneumonia. Patient was managed on heparin  IV and antibiotics During workup, patient with evidence of acute heart failure from known severe HFrEF. Cardiology consulted for management; patient started on diuresis and milrinone .   Assessment and Plan:  Acute pulmonary embolism Patient with pleuritic chest pain with evidence of segmental pulmonary emboli within the right middle and left lower lobes on CTA chest. Patient started on heparin  IV for management. Transthoracic Echocardiogram significant for severe RV dysfunction, however unlikely related to acute PE with level of clot burden. -Continue Heparin  IV with plan to transition to Eliquis   RLL pneumonia Right lower lobe ground-glass opacity noted on CT imaging. Patient started on Ceftriaxone  and azithromycin .  Acute HFrEF Transthoracic Echocardiogram this admission significant for an LVEF less than 20% which is stable from prior Transthoracic Echocardiogram.. Cardiology managing with diuresis and milrinone . Concern for end-stage disease. -Cardiology recommendations: milrinone , plan for RHC on 5/23 -Continue Entresto , Farxiga , spironolactone , digoxin   PVCs Patient started on mexiletine.  CAD Aortic atherosclerosis -Continue Zetia  and Lipitor   Fatty liver disease Noted.  Tobacco use Cessation discussed.  Obesity, class I Estimated body mass index is 33.45 kg/m as calculated from the following:   Height as of this encounter: 6\' 2"  (1.88 m).   Weight as of this encounter: 118.2 kg.   DVT prophylaxis: Heparin  IV Code Status:   Code Status: Full Code Family Communication: None at bedside Disposition Plan:  Discharge home pending cardiology recommendations   Consultants:  Cardiology  Procedures:  Transthoracic Echocardiogram  Antimicrobials: Ceftriaxone  IV Azithromycin     Subjective: No issues this morning. Breathing well. No chest pain.  Objective: BP (!) 123/91 (BP Location: Left Arm)   Pulse 100   Temp 98 F (36.7 C) (Oral)   Resp 15   Ht 6\' 2"  (1.88 m)   Wt 118.2 kg   SpO2 93%   BMI 33.45 kg/m   Examination:  General exam: Appears calm and comfortable Respiratory system: Respiratory effort normal. Central nervous system: Alert and oriented. No focal neurological deficits. Psychiatry: Judgement and insight appear normal. Mood & affect appropriate.    Data Reviewed: I have personally reviewed following labs and imaging studies  CBC Lab Results  Component Value Date   WBC 9.6 08/08/2023   RBC 4.95 08/08/2023   HGB 14.9 08/08/2023   HCT 44.3 08/08/2023   MCV 89.5 08/08/2023   MCH 30.1 08/08/2023   PLT 217 08/08/2023   MCHC 33.6 08/08/2023   RDW 13.3 08/08/2023   LYMPHSABS 2.1 08/07/2023   MONOABS 1.3 (H) 08/07/2023   EOSABS 0.2 08/07/2023   BASOSABS 0.1 08/07/2023     Last metabolic panel Lab Results  Component Value Date   NA 136 08/08/2023   K 3.6 08/08/2023   CL 101 08/08/2023   CO2 28 08/08/2023   BUN 13 08/08/2023   CREATININE 0.84 08/08/2023   GLUCOSE 106 (H) 08/08/2023   GFRNONAA >60 08/08/2023   GFRAA 141 10/31/2018   CALCIUM  8.2 (L) 08/08/2023   PHOS 4.0 08/07/2023   PROT 6.8 08/07/2023   ALBUMIN 3.2 (L) 08/07/2023   BILITOT 1.6 (H) 08/07/2023   ALKPHOS 55 08/07/2023   AST 20 08/07/2023   ALT  15 08/07/2023   ANIONGAP 7 08/08/2023    GFR: Estimated Creatinine Clearance: 154.9 mL/min (by C-G formula based on SCr of 0.84 mg/dL).  Recent Results (from the past 240 hours)  Resp panel by RT-PCR (RSV, Flu A&B, Covid) Nasal Mucosa     Status: None   Collection Time: 08/05/23  9:32 AM   Specimen: Nasal Mucosa; Nasal Swab  Result  Value Ref Range Status   SARS Coronavirus 2 by RT PCR NEGATIVE NEGATIVE Final   Influenza A by PCR NEGATIVE NEGATIVE Final   Influenza B by PCR NEGATIVE NEGATIVE Final    Comment: (NOTE) The Xpert Xpress SARS-CoV-2/FLU/RSV plus assay is intended as an aid in the diagnosis of influenza from Nasopharyngeal swab specimens and should not be used as a sole basis for treatment. Nasal washings and aspirates are unacceptable for Xpert Xpress SARS-CoV-2/FLU/RSV testing.  Fact Sheet for Patients: BloggerCourse.com  Fact Sheet for Healthcare Providers: SeriousBroker.it  This test is not yet approved or cleared by the [place]  FDA and has been authorized for detection and/or diagnosis of SARS-CoV-2 by FDA under an Emergency Use Authorization (EUA). This EUA will remain in effect (meaning this test can be used) for the duration of the COVID-19 declaration under Section 564(b)(1) of the Act, 21 U.S.C. section 360bbb-3(b)(1), unless the authorization is terminated or revoked.     Resp Syncytial Virus by PCR NEGATIVE NEGATIVE Final    Comment: (NOTE) Fact Sheet for Patients: BloggerCourse.com  Fact Sheet for Healthcare Providers: SeriousBroker.it  This test is not yet approved or cleared by the [place]  FDA and has been authorized for detection and/or diagnosis of SARS-CoV-2 by FDA under an Emergency Use Authorization (EUA). This EUA will remain in effect (meaning this test can be used) for the duration of the COVID-19 declaration under Section 564(b)(1) of the Act, 21 U.S.C. section 360bbb-3(b)(1), unless the authorization is terminated or revoked.  Performed at [facility], [address], [place], [place] [address]   Culture, blood (Routine X 2) w Reflex to ID Panel     Status: None (Preliminary result)   Collection Time: 08/05/23 10:35 AM   Specimen: BLOOD  Result  Value Ref Range Status   Specimen Description BLOOD SITE NOT SPECIFIED  Final   Special Requests   Final    BOTTLES DRAWN AEROBIC AND ANAEROBIC Blood Culture results may not be optimal due to an inadequate volume of blood received in culture bottles   Culture   Final    NO GROWTH 3 DAYS Performed at [facility], [address], [place], [place] [address]    Report Status PENDING  Incomplete  Culture, blood (Routine X 2) w Reflex to ID Panel     Status: None (Preliminary result)   Collection Time: 08/05/23 10:35 AM   Specimen: BLOOD  Result Value Ref Range Status   Specimen Description BLOOD SITE NOT SPECIFIED  Final   Special Requests   Final    BOTTLES DRAWN AEROBIC AND ANAEROBIC Blood Culture results may not be optimal due to an inadequate volume of blood received in culture bottles   Culture   Final    NO GROWTH 3 DAYS Performed at [facility], [address], [place], [place] [address]    Report Status PENDING  Incomplete  Expectorated Sputum Assessment w Gram Stain, Rflx to Resp Cult     Status: None   Collection Time: 08/05/23  3:00 PM   Specimen: Expectorated Sputum  Result  Value Ref Range Status   Specimen Description EXPECTORATED SPUTUM  Final   Special Requests NONE  Final   Sputum evaluation   Final    Sputum specimen not acceptable for testing.  Please recollect.   Gram Stain Report Called to,Read Back By and Verified With: RN C. ACEBO [id number] @1754  FH Performed at [facility], [address] [place], [place] [address]    Report Status 08/05/2023 FINAL  Final  Surgical pcr screen     Status: None   Collection Time: 08/06/23  4:00 AM   Specimen: Nasal Mucosa; Nasal Swab  Result Value Ref Range Status   MRSA, PCR NEGATIVE NEGATIVE Final   Staphylococcus aureus NEGATIVE NEGATIVE Final    Comment: (NOTE) The Xpert SA Assay (FDA approved for NASAL specimens in patients 23 years of age and older), is one component of a comprehensive surveillance  program. It is not intended to diagnose infection nor to guide or monitor treatment. Performed at [facility], [address] [place], [place] [address]       Radiology Studies: No results found.     LOS: 5 days    Aneita Keens, MD [facility] 08/08/2023, 12:48 PM   If 7PM-7AM, please contact night-coverage [email or web address]

## 2023-08-08 NOTE — Plan of Care (Signed)

## 2023-08-08 NOTE — Progress Notes (Signed)
 Advanced Heart Failure Rounding Note  Cardiologist: Randene Bustard, MD  Chief Complaint: Low output Heart Failure Subjective:    Co-ox 53 on Milrinone  0.25. CVP 1.  Sitting on EOB. Feeling well this morning. Was able to sleep lying flat overnight.   Objective:    Weight Range: 118.2 kg Body mass index is 33.45 kg/m.   Vital Signs:   Temp:  [97.6 F (36.4 C)-98.6 F (37 C)] 97.6 F (36.4 C) (05/22 0440) Pulse Rate:  [67-100] 91 (05/22 0440) Resp:  [13-18] 18 (05/22 0440) BP: (105-145)/(65-78) 132/77 (05/22 0440) SpO2:  [90 %-100 %] 100 % (05/22 0440) Weight:  [118.2 kg] 118.2 kg (05/22 0440) Last BM Date : 08/07/23  Weight change: Filed Weights   08/06/23 0400 08/07/23 0426 08/08/23 0440  Weight: 117.8 kg 118.3 kg 118.2 kg   Intake/Output: Intake/Output Summary (Last 24 hours) at 08/08/2023 0708 Last data filed at 08/08/2023 0513 Gross per 24 hour  Intake 2026.24 ml  Output 2025 ml  Net 1.24 ml    Physical Exam    CVP 1 General: Well appearing. No distress on RA Cardiac: JVP flat. S1 and S2 present. No murmurs or rub. Extremities: Warm and dry.  No peripheral edema.  Neuro: Alert and oriented x3. Affect pleasant. Moves all extremities without difficulty.  Telemetry   Trigeminy in 90s, ~25 PVCs/min  EKG    N/A  Labs    CBC Recent Labs    08/07/23 0415 08/08/23 0450  WBC 10.1 9.6  NEUTROABS 6.3  --   HGB 14.5 14.9  HCT 43.2 44.3  MCV 89.8 89.5  PLT 191 217   Basic Metabolic Panel Recent Labs    16/10/96 0415 08/08/23 0450  NA 135 136  K 3.3* 3.6  CL 97* 101  CO2 27 28  GLUCOSE 129* 106*  BUN 15 13  CREATININE 0.94 0.84  CALCIUM  8.4* 8.2*  MG 2.1 2.2  PHOS 4.0  --    Liver Function Tests Recent Labs    08/06/23 0405 08/07/23 0415  AST 18 20  ALT 15 15  ALKPHOS 63 55  BILITOT 3.6* 1.6*  PROT 7.4 6.8  ALBUMIN 3.5 3.2*   BNP (last 3 results) Recent Labs    08/03/23 1028  BNP 892.8*   Medications:    Scheduled  Medications:  acetaminophen   1,000 mg Oral Q8H   aspirin  EC  81 mg Oral Daily   atorvastatin   80 mg Oral q1800   Chlorhexidine  Gluconate Cloth  6 each Topical Daily   colchicine   0.6 mg Oral Daily   dapagliflozin  propanediol  10 mg Oral Daily   digoxin   0.125 mg Oral Daily   ezetimibe   10 mg Oral Daily   guaiFENesin   600 mg Oral BID   mexiletine  300 mg Oral Q12H   pantoprazole   40 mg Oral Daily   polyethylene glycol  17 g Oral BID   sacubitril -valsartan   1 tablet Oral BID   sodium chloride  flush  10-40 mL Intracatheter Q12H   spironolactone   25 mg Oral Daily    Infusions:  heparin  2,000 Units/hr (08/08/23 0513)   milrinone  0.125 mcg/kg/min (08/08/23 0624)    PRN Medications: acetaminophen  **FOLLOWED BY** [START ON 08/10/2023] acetaminophen , melatonin, oxyCODONE  **OR** oxyCODONE , polyethylene glycol, prochlorperazine , sodium chloride  flush  Patient Profile   Alanson Hausmann is a 43 y.o. male w/ h/o HFrEF, premature CAD s/p multiple MIs, PE/DVT, HTN, HLD, fatty liver disease, tobacco use and poor compliance.   Assessment/Plan  1. Acute on Chronic systolic CHF: ICM. ?Component of NICM d/t HTN +/- PVCs. Echo 7/23 EF < 20%, RV severely reduced. [facility] 7/23 demonstrated diffuse CAD, mildly elevated PCWP w/ pulmonary venous hypertension, normal RA pressure and preserved CO.  - Echo this admission EF <20 , severely reduced RV function BAE - Has been off HF medications prior to admission. Compliance remains an issue. - Low output HF. Initial co-ox 48%. Started on Milrinone  0.25. Increased to 0.375 on 5/20. - Co-ox 53 on Milrinone  0.25. Weaned to 0.125 this morning. Repeat coox.  - CVP 1. No need for diuretics at this time. PRN at discharge. - Increase Entresto  49/51 mg bid  - Continue Farxiga  10 mg daily - Continue Spiro 25 mg daily  - Continue digoxin  0.125 mg daily   - hold ? blocker with low output - Needs ICD, however has not made appointment with prev referrals to EP - Worry that  he is end-stage and need to consider advanced therapies. However, options limited by long history of noncompliance and tobacco use (vaping).    2. Acute DVT/PE: H/o prior PE in 3/24.  - CT 5/17 minimal clot burden PE in RML and LLL. Had been off of Eliquis  for at least a month.  - repeat CTPE with stable clot burden and RML opacity appears more concerning for pulmonary hemorrhage then PNA - Will likely need indefinite anticoagulation, currently on heparin . Eventually Eliquis .   3. Pleuritic chest pain: Noted opacity on CTPE in RLL. Leukocytosis. WBC up to 18>15>10 today  - Findings on imaging more concerning for hemorrhage rather than PNA on follow-up imaging  - continue colchicine  0.6 mg daily; CRP 20 and ESR 36 - procal 0.11, bcx and sputum culture pending - continue abx for 5 day course per primary   4. CAD: Strong family history of premature CAD.  H/o inferolateral STEMI in 9/15 with DES to LCx into OM1 and DES to RCA. LHC 7/23 demonstrated diffuse CAD. - hs-trop 30>29. Not ACS. - Continue ASA 81 mg +  atorva 80 mg + Zetia  10 mg daily   5. PVCs:  - Continues with high PVC burden, 20-30%, hopefully this will improve with weaning inotrope - continue mexiletine 300 mg bid - hold ? blocker with low output - eventually would need EP eval for ICD +/- PVC ablation but HF may be too far advanced for benefit   6. HTN: BP improved - GDMT as above.   Length of Stay: 5  [place] Kenyada Dosch, NP  08/08/2023, 7:08 AM  Advanced Heart Failure Team Pager [phone] (M-F; 7a - 5p)  Please contact [facility] for night-coverage after hours (5p -7a ) and weekends on [email or web address]

## 2023-08-08 NOTE — Telephone Encounter (Signed)
 Called & spoke to the patient. Verified name & DOB. Patient appointment scheduled for August 23, 2023 at 9:30 am patient acknowledged and had no further questions.  Copied from CRM [id number]. Topic: Appointments - Appointment Scheduling >> Aug 08, 2023  3:57 PM Emylou G wrote: F/u appt discharge 5/22.. for PE.Aaron Aas  Needs to be seen in 14 days?

## 2023-08-09 ENCOUNTER — Telehealth: Payer: Self-pay

## 2023-08-09 ENCOUNTER — Encounter (HOSPITAL_COMMUNITY)
Admission: EM | Disposition: A | Discharge status: Home / Self Care | Payer: Self-pay | Source: Home / Self Care | Attending: Family Medicine

## 2023-08-09 ENCOUNTER — Inpatient Hospital Stay (HOSPITAL_COMMUNITY)

## 2023-08-09 ENCOUNTER — Encounter (HOSPITAL_COMMUNITY): Payer: Self-pay | Admitting: Cardiology

## 2023-08-09 ENCOUNTER — Inpatient Hospital Stay (HOSPITAL_COMMUNITY)
Admit: 2023-08-09 | Discharge: 2023-08-09 | Disposition: A | Discharge status: Home / Self Care | Attending: Cardiology | Admitting: Cardiology

## 2023-08-09 DIAGNOSIS — I700 Atherosclerosis of aorta: Secondary | ICD-10-CM | POA: Diagnosis not present

## 2023-08-09 DIAGNOSIS — I493 Ventricular premature depolarization: Secondary | ICD-10-CM

## 2023-08-09 DIAGNOSIS — J189 Pneumonia, unspecified organism: Secondary | ICD-10-CM | POA: Diagnosis not present

## 2023-08-09 DIAGNOSIS — I38 Endocarditis, valve unspecified: Secondary | ICD-10-CM

## 2023-08-09 DIAGNOSIS — I2694 Multiple subsegmental pulmonary emboli without acute cor pulmonale: Secondary | ICD-10-CM | POA: Diagnosis not present

## 2023-08-09 DIAGNOSIS — I5021 Acute systolic (congestive) heart failure: Secondary | ICD-10-CM | POA: Diagnosis not present

## 2023-08-09 HISTORY — PX: RIGHT HEART CATH: CATH118263

## 2023-08-09 LAB — CBC
HCT: 47.4 % (ref 39.0–52.0)
Hemoglobin: 16 g/dL (ref 13.0–17.0)
MCH: 30.1 pg (ref 26.0–34.0)
MCHC: 33.8 g/dL (ref 30.0–36.0)
MCV: 89.3 fL (ref 80.0–100.0)
Platelets: 257 10*3/uL (ref 150–400)
RBC: 5.31 MIL/uL (ref 4.22–5.81)
RDW: 13.5 % (ref 11.5–15.5)
WBC: 11 10*3/uL — ABNORMAL HIGH (ref 4.0–10.5)
nRBC: 0 % (ref 0.0–0.2)

## 2023-08-09 LAB — POCT I-STAT EG7
Acid-Base Excess: 0 mmol/L (ref 0.0–2.0)
Acid-base deficit: 1 mmol/L (ref 0.0–2.0)
Bicarbonate: 24.3 mmol/L (ref 20.0–28.0)
Bicarbonate: 25.5 mmol/L (ref 20.0–28.0)
Calcium, Ion: 1.1 mmol/L — ABNORMAL LOW (ref 1.15–1.40)
Calcium, Ion: 1.21 mmol/L (ref 1.15–1.40)
HCT: 44 % (ref 39.0–52.0)
HCT: 46 % (ref 39.0–52.0)
Hemoglobin: 15 g/dL (ref 13.0–17.0)
Hemoglobin: 15.6 g/dL (ref 13.0–17.0)
O2 Saturation: 49 %
O2 Saturation: 52 %
Potassium: 3.9 mmol/L (ref 3.5–5.1)
Potassium: 4.2 mmol/L (ref 3.5–5.1)
Sodium: 140 mmol/L (ref 135–145)
Sodium: 141 mmol/L (ref 135–145)
TCO2: 26 mmol/L (ref 22–32)
TCO2: 27 mmol/L (ref 22–32)
pCO2, Ven: 40.8 mmHg — ABNORMAL LOW (ref 44–60)
pCO2, Ven: 43.8 mmHg — ABNORMAL LOW (ref 44–60)
pH, Ven: 7.374 (ref 7.25–7.43)
pH, Ven: 7.384 (ref 7.25–7.43)
pO2, Ven: 27 mmHg — CL (ref 32–45)
pO2, Ven: 28 mmHg — CL (ref 32–45)

## 2023-08-09 LAB — COOXEMETRY PANEL
Carboxyhemoglobin: 1.1 % (ref 0.5–1.5)
Methemoglobin: <0.7 % (ref 0.0–1.5)
O2 Saturation: 57 %
Total hemoglobin: 16.3 g/dL — ABNORMAL HIGH (ref 12.0–16.0)

## 2023-08-09 LAB — BASIC METABOLIC PANEL WITH GFR
Anion gap: 9 (ref 5–15)
BUN: 12 mg/dL (ref 6–20)
CO2: 23 mmol/L (ref 22–32)
Calcium: 9 mg/dL (ref 8.9–10.3)
Chloride: 104 mmol/L (ref 98–111)
Creatinine, Ser: 0.92 mg/dL (ref 0.61–1.24)
GFR, Estimated: >60 mL/min (ref >60)
Glucose, Bld: 152 mg/dL — ABNORMAL HIGH (ref 70–99)
Potassium: 4 mmol/L (ref 3.5–5.1)
Sodium: 136 mmol/L (ref 135–145)

## 2023-08-09 LAB — ABO/RH: ABO/RH(D): B POS

## 2023-08-09 LAB — MAGNESIUM: Magnesium: 2.2 mg/dL (ref 1.7–2.4)

## 2023-08-09 SURGERY — RIGHT HEART CATH
Anesthesia: LOCAL

## 2023-08-09 MED ORDER — HEPARIN (PORCINE) IN NACL 1000-0.9 UT/500ML-% IV SOLN
INTRAVENOUS | Status: DC | PRN
  Administered 2023-08-09: 500 mL

## 2023-08-09 MED ORDER — AMIODARONE HCL 200 MG PO TABS: 400.0000 mg | ORAL_TABLET | Freq: Every day | ORAL | Status: DC

## 2023-08-09 MED ORDER — TORSEMIDE 20 MG PO TABS
20.0000 mg | ORAL_TABLET | Freq: Every day | ORAL | Status: DC
  Administered 2023-08-09 – 2023-08-10 (×2): 20 mg via ORAL
  Filled 2023-08-09 (×2): qty 1

## 2023-08-09 MED ORDER — LIDOCAINE HCL (PF) 1 % IJ SOLN
INTRAMUSCULAR | Status: DC | PRN
Start: 2023-08-09 — End: 2023-08-09
  Administered 2023-08-09: 2 mL via INTRADERMAL

## 2023-08-09 MED ORDER — SODIUM CHLORIDE 0.9 % IV SOLN: INTRAVENOUS | Status: DC

## 2023-08-09 MED ORDER — AMIODARONE HCL 200 MG PO TABS: 200.0000 mg | ORAL_TABLET | Freq: Every day | ORAL | Status: DC

## 2023-08-09 MED ORDER — SACUBITRIL-VALSARTAN 97-103 MG PO TABS
1.0000 | ORAL_TABLET | Freq: Two times a day (BID) | ORAL | Status: DC
  Administered 2023-08-09 – 2023-08-10 (×3): 1 via ORAL
  Filled 2023-08-09 (×3): qty 1

## 2023-08-09 MED ORDER — FUROSEMIDE 10 MG/ML IJ SOLN
80.0000 mg | Freq: Once | INTRAMUSCULAR | Status: AC
  Administered 2023-08-09: 80 mg via INTRAVENOUS
  Filled 2023-08-09: qty 8

## 2023-08-09 MED ORDER — LIDOCAINE HCL (PF) 1 % IJ SOLN
INTRAMUSCULAR | Status: AC
  Filled 2023-08-09: qty 30

## 2023-08-09 MED ORDER — AMIODARONE HCL 200 MG PO TABS
400.0000 mg | ORAL_TABLET | Freq: Two times a day (BID) | ORAL | Status: DC
  Administered 2023-08-09 – 2023-08-10 (×2): 400 mg via ORAL
  Filled 2023-08-09 (×2): qty 2

## 2023-08-09 SURGICAL SUPPLY — 6 items
CATH SWAN GANZ 7F STRAIGHT (CATHETERS) IMPLANT
GLIDESHEATH SLENDER 7FR .021G (SHEATH) IMPLANT
PACK CARDIAC CATHETERIZATION (CUSTOM PROCEDURE TRAY) ×1 IMPLANT
SHEATH PROBE COVER 6X72 (BAG) IMPLANT
TRANSDUCER W/STOPCOCK (MISCELLANEOUS) IMPLANT
TUBING ART PRESS 72 MALE/FEM (TUBING) IMPLANT

## 2023-08-09 NOTE — Progress Notes (Signed)
 MCS EDUCATION NOTE:                VAD evaluation consent reviewed and signed by patient Reginald Fernandez. Initial VAD teaching completed with pt and caregiver.   VAD educational packet including "Understanding Your Options with Advanced Heart Failure", "[place] Patient Agreement for VAD Evaluation and Potential Implantation" consent, and [employer] "Heartmate 3 Left Ventricular Device (LVAD) Patient Guide", Heartmate 3 Left Ventricular Assist System Patient Education Program DVD", "[place] HM III Patient Education", "[place] Mechanical Circulatory Support Program", and "Decision Aids for Left Ventricular Assist Device" reviewed in detail and left at bedside for continued reference.   All questions answered regarding VAD implant, hospital stay, and what to expect when discharged home living with a heart pump. Explained need for 24/7 care when pt is discharged home due to sternal precautions, adaptation to living on support, emotional support, consistent and meticulous exit site care and management, medication adherence and high volume of follow up visits with the VAD Clinic after discharge; both pt and caregiver verbalized understanding of above.   Explained that LVAD can be implanted for two indications in the setting of advanced left ventricular heart failure treatment:  Bridge to transplant - used for patients who cannot safely wait for heart transplant without this device.  Or    Destination therapy - used for patients until end of life or recovery of heart function.  Patient and caregiver(s) acknowledge that the indication at this point in time for LVAD therapy would be for destination therapy due to active tobacco use.   Provided brief equipment overview and demonstration with HeartMate III training loop including discussion on the following:   a) mobile power unit b) system controller   c) universal Magazine features editor   d) battery clips   e) Batteries   f)  Perc lock   g) Percutaneous  lead   Demonstrated and discussed:  a) changing power source on system controller from tethered (MPU) to untethered (battery) mode   b) changing power source on system controller from untethered (battery) to tethered (MPU) mode   c) how to monitor battery life both on the system controller and on each individual battery   d) changing batteries   Reviewed and supplied a copy of home inspection check list stressing that only three pronged grounded power outlets can be used for VAD equipment. Patient confirmed home has electrical outlets that will support the equipment along with access working telephone.  Identified the following lifestyle modifications while living on MCS:    1. No driving for at least three months and then only if doctor gives permission to do so.   2. No tub baths while pump implanted, and shower only when doctor gives permission.   3. No swimming or submersion in water while implanted with pump.   4. No contact sports or engaging in jumping activities.   5. Always have a backup controller, charged spare batteries, and battery clips nearby at all times in case of emergency.   6. Call the doctor or hospital contact person if any change in how the pump sounds, feels, or works.   7. Plan to sleep only when connected to the power module.   8. Do not sleep on your stomach.   9. Keep a backup system controller, charged batteries, battery clips, and flashlight near you during sleep in case of electrical power outage.   10. Exit site care including dressing changes, monitoring for infection, and importance of keeping  percutaneous lead stabilized at all times.      Extended the option to have one of our current patients and caregiver(s) come to talk with them about living on support to assist with decision making. Will set this up in outpatient setting.  Reviewed pictures of VAD drive line, site care, dressing changes, and drive line stabilization including securement attachment  device and abdominal binder. Discussed with pt and family that they will be required to purchase dressing supplies as long as patient has the VAD in place.   Reinforced need for 24 hour/7 day week caregivers. Pt to talk to family about caregiver role to see who will be agreeable to act as a caregiver. He will also need to abide by sternal precautions with no lifting >10lbs, pushing, pulling and will need assistance with adapting to new life style with VAD equipment and care.   Intermacs patient survival statistics through December 2024 reviewed with patient and caregiver as follows:    The patient understands that from this discussion it does not mean that they will receive the device, but that depends on an extensive evaluation process. The patient is aware of the fact that if at anytime they want to stop the evaluation process they can.  All questions have been answered at this time and contact information was provided should they encounter any further questions.  They are both agreeable at this time to the evaluation process and will move forward.    Laurice Pope RN,BSN VAD Coordinator  Office: [phone] 24/7 VAD Pager: [phone]

## 2023-08-09 NOTE — TOC Progression Note (Signed)
 Transition of Care [facility]) - Progression Note    Patient Details  Name: Reginald Fernandez MRN: [id number] Date of Birth: 1980/10/18  [facility] CM/SW Contact  Ernst Heap Phone Number: [phone] 08/09/2023, 9:50 AM  Clinical Narrative:  HF CSW called and attempted to schedule the patients hospital follow up appointment. Patients PCP does not have any appts. In a 2 week timeframe. PCP will call patient directly to schedule.   HF CSW called and spoke with the patient over the phone. Patient stated that he has transportation home for dc.      Expected Discharge Plan: Home/Self Care Barriers to Discharge: No Barriers Identified  Expected Discharge Plan and Services   Discharge Planning Services: CM Consult   Living arrangements for the past 2 months: Single Family Home                                       Social Determinants of Health (SDOH) Interventions SDOH Screenings   Food Insecurity: No Food Insecurity (08/03/2023)  Housing: Low Risk  (08/03/2023)  Transportation Needs: No Transportation Needs (08/03/2023)  Utilities: Not At Risk (08/03/2023)  Depression (PHQ2-9): Low Risk  (06/27/2022)  Tobacco Use: Medium Risk (08/03/2023)    Readmission Risk Interventions     No data to display

## 2023-08-09 NOTE — Telephone Encounter (Signed)
 Copied from CRM [id number]. Topic: Appointments - Appointment Scheduling >> Aug 09, 2023  9:46 AM Baldemar Lev wrote: Patient/patient representative is calling to schedule an appointment. Refer to attachments for appointment information.   Pt needs to be scheduled for a hosp fu. SW is okay with patient being seen beyond 2-3 weeks. Please advise   Best contact: [phone]

## 2023-08-09 NOTE — Progress Notes (Signed)
 Advanced Heart Failure Rounding Note  Cardiologist: Randene Bustard, MD  Chief Complaint: Low output Heart Failure Subjective:    Off Milrinone , co-ox 57%.  Plan for RHC today w Dr. Alease Amend  Sitting up on EOB. Feeling well. No SOB, CP or dizziness.   Objective:    Weight Range: 116.8 kg Body mass index is 33.06 kg/m.   Vital Signs:   Temp:  [98 F (36.7 C)-98.8 F (37.1 C)] 98.8 F (37.1 C) (05/23 0735) Pulse Rate:  [78-100] 78 (05/23 0735) Resp:  [14-20] 20 (05/23 0735) BP: (112-152)/(79-98) 112/79 (05/23 0735) SpO2:  [95 %-100 %] 95 % (05/23 0735) Weight:  [116.8 kg] 116.8 kg (05/23 0454) Last BM Date : 08/08/23  Weight change: Filed Weights   08/07/23 0426 08/08/23 0440 08/09/23 0454  Weight: 118.3 kg 118.2 kg 116.8 kg   Intake/Output: Intake/Output Summary (Last 24 hours) at 08/09/2023 0828 Last data filed at 08/09/2023 0736 Gross per 24 hour  Intake 875 ml  Output 1000 ml  Net -125 ml    Physical Exam    General: Well appearing. No distress on RA Cardiac: JVP flat. S1 and S2 present. No murmurs or rub. Extremities: Warm and dry.  No peripheral edema.  Neuro: Alert and oriented x3. Affect pleasant. Moves all extremities without difficulty. Lines/Devices:  RUE PICC  Telemetry   Ventricular Bigeminy in 80-90s, PVC burden remains high (personally reviewed)  EKG    N/A  Labs    CBC Recent Labs    08/07/23 0415 08/08/23 0450 08/09/23 0610  WBC 10.1 9.6 11.0*  NEUTROABS 6.3  --   --   HGB 14.5 14.9 16.0  HCT 43.2 44.3 47.4  MCV 89.8 89.5 89.3  PLT 191 217 257   Basic Metabolic Panel Recent Labs    78/29/56 0415 08/08/23 0450 08/09/23 0610  NA 135 136 136  K 3.3* 3.6 4.0  CL 97* 101 104  CO2 27 28 23   GLUCOSE 129* 106* 152*  BUN 15 13 12   CREATININE 0.94 0.84 0.92  CALCIUM  8.4* 8.2* 9.0  MG 2.1 2.2 2.2  PHOS 4.0  --   --    Liver Function Tests Recent Labs    08/07/23 0415  AST 20  ALT 15  ALKPHOS 55  BILITOT 1.6*  PROT  6.8  ALBUMIN 3.2*   BNP (last 3 results) Recent Labs    08/03/23 1028  BNP 892.8*   Medications:    Scheduled Medications:  acetaminophen   1,000 mg Oral Q8H   amiodarone  400 mg Oral BID   apixaban   10 mg Oral BID   Followed by   Cecily Cohen ON 08/11/2023] apixaban   5 mg Oral BID   aspirin  EC  81 mg Oral Daily   atorvastatin   80 mg Oral q1800   Chlorhexidine  Gluconate Cloth  6 each Topical Daily   colchicine   0.6 mg Oral Daily   dapagliflozin  propanediol  10 mg Oral Daily   digoxin   0.125 mg Oral Daily   ezetimibe   10 mg Oral Daily   guaiFENesin   600 mg Oral BID   mexiletine  300 mg Oral Q12H   pantoprazole   40 mg Oral Daily   polyethylene glycol  17 g Oral BID   sacubitril -valsartan   1 tablet Oral BID   sodium chloride  flush  10-40 mL Intracatheter Q12H   spironolactone   25 mg Oral Daily    Infusions:  sodium chloride  10 mL/hr at 08/09/23 0821    PRN Medications: acetaminophen  **  FOLLOWED BY** [START ON 08/10/2023] acetaminophen , melatonin, oxyCODONE  **OR** oxyCODONE , polyethylene glycol, prochlorperazine , sodium chloride  flush  Patient Profile   Reginald Fernandez is a 43 y.o. male w/ h/o HFrEF, premature CAD s/p multiple MIs, PE/DVT, HTN, HLD, fatty liver disease, tobacco use and poor compliance.   Assessment/Plan   1. Acute on Chronic systolic CHF: ICM. ?Component of NICM d/t HTN +/- PVCs. Echo 7/23 EF < 20%, RV severely reduced. [facility] 7/23 demonstrated diffuse CAD, mildly elevated PCWP w/ pulmonary venous hypertension, normal RA pressure and preserved CO.  - Echo this admission EF <20 , severely reduced RV function BAE - Has been off HF medications prior to admission. Compliance remains an issue. - Low output HF. Initial co-ox 48%. Started on Milrinone  0.25. Increased to 0.375 on 5/20. Now off.  - Co-ox 57. Hold diuretics - Increase Entresto  97/103 mg bid for hypertension - Continue Farxiga  10 mg daily - Continue Spiro 25 mg daily  - Continue digoxin  0.125 mg daily   -  hold ? blocker with low output - Needs ICD, however has not made appointment with prev referrals to EP - Worry that he is end-stage and need to consider advanced therapies. However, options limited by long history of noncompliance and tobacco use (vaping).  - Plan for RHC today with Dr. Alease Amend Informed Consent   Shared Decision Making/Informed Consent The risks [stroke (1 in 1000), death (1 in 1000), kidney failure [usually temporary] (1 in 500), bleeding (1 in 200), allergic reaction [possibly serious] (1 in 200)], benefits (diagnostic support and management of coronary artery disease) and alternatives of a cardiac catheterization were discussed in detail with Mr. Kleinman and he is willing to proceed.    2. Acute DVT/PE: H/o prior PE in 3/24.  - CT 5/17 minimal clot burden PE in RML and LLL. Had been off of Eliquis  for at least a month.  - repeat CTPE with stable clot burden and RML opacity appears more concerning for pulmonary hemorrhage then PNA - continue Eliquis  load 10 mg bid x3, then 5 mg bid   3. Pleuritic chest pain: Noted opacity on CTPE in RLL. Leukocytosis. WBC up to 18>15>10 today  - Findings on imaging more concerning for hemorrhage rather than PNA on follow-up imaging  - continue colchicine  0.6 mg daily; CRP 20 and ESR 36 - procal 0.11, cultures negative - completed 5 day course abx per primary   4. CAD: Strong family history of premature CAD.  H/o inferolateral STEMI in 9/15 with DES to LCx into OM1 and DES to RCA. LHC 7/23 demonstrated diffuse CAD. - hs-trop 30>29. Not ACS. - Continue ASA 81 mg +  atorva 80 mg + Zetia  10 mg daily   5. PVCs:  - Continues with high PVC burden, 20-30%, hopefully this will improve with weaning inotrope - continue mexiletine 300 mg bid - continue amio 400 mg bid for 7 days, then 400 mg daily x7 days, than 200 mg daily - hold ? blocker with low output - eventually would need EP eval for ICD +/- PVC ablation but HF may be too far advanced for  benefit - will place Zio AT at discharge.    6. HTN: BP improving - GDMT as above.   Length of Stay: 6  [place] Damaree Sargent, NP  08/09/2023, 8:28 AM  Advanced Heart Failure Team Pager [phone] (M-F; 7a - 5p)  Please contact [facility] for night-coverage after hours (5p -7a ) and weekends on [email or web address]

## 2023-08-09 NOTE — Plan of Care (Signed)
  Problem: Education: Goal: Knowledge of General Education information will improve Description: Including pain rating scale, medication(s)/side effects and non-pharmacologic comfort measures Outcome: Progressing   Problem: Health Behavior/Discharge Planning: Goal: Ability to manage health-related needs will improve Outcome: Progressing   Problem: Clinical Measurements: Goal: Ability to maintain clinical measurements within normal limits will improve Outcome: Progressing Goal: Diagnostic test results will improve Outcome: Progressing Goal: Respiratory complications will improve Outcome: Progressing   Problem: Activity: Goal: Risk for activity intolerance will decrease Outcome: Progressing   Problem: Nutrition: Goal: Adequate nutrition will be maintained Outcome: Progressing   Problem: Pain Managment: Goal: General experience of comfort will improve and/or be controlled Outcome: Progressing   Problem: Safety: Goal: Ability to remain free from injury will improve Outcome: Progressing   Problem: Skin Integrity: Goal: Risk for impaired skin integrity will decrease Outcome: Progressing   Problem: Activity: Goal: Ability to return to baseline activity level will improve Outcome: Progressing   Problem: Cardiovascular: Goal: Vascular access site(s) Level 0-1 will be maintained Outcome: Progressing

## 2023-08-09 NOTE — Telephone Encounter (Signed)
 Call placed to patient unable to reach message left on VM. Call to schedule appointment as requested

## 2023-08-09 NOTE — Progress Notes (Signed)
 [name]  WGN:[id number] DOB: [birth year] DOA: 08/03/2023 PCP: Lawrance Presume, MD   Brief Narrative: Reginald Fernandez is a 43 y.o. male with a history of CAD s/p PCI, hypertension, hyperlipidemia, morbid obesity.  Patient presented secondary to dyspnea on exertion with evidence of acute PE and pneumonia. Patient was managed on heparin  IV and antibiotics During workup, patient with evidence of acute heart failure from known severe HFrEF. Cardiology consulted for management; patient started on diuresis and milrinone .   Assessment and Plan:  Acute pulmonary embolism Patient with pleuritic chest pain with evidence of segmental pulmonary emboli within the right middle and left lower lobes on CTA chest. Patient started on heparin  IV for management. Transthoracic Echocardiogram significant for severe RV dysfunction, however unlikely related to acute PE with level of clot burden. Patient transitioned to Eliquis  -Continue Eliquis   RLL pneumonia Right lower lobe ground-glass opacity noted on CT imaging. Patient started on Ceftriaxone  and azithromycin .  Acute HFrEF Transthoracic Echocardiogram this admission significant for an LVEF less than 20% which is stable from prior Transthoracic Echocardiogram.. Cardiology managing with diuresis and milrinone . Concern for end-stage disease. Right heart catheterization performed on 5/23. -Cardiology recommendations: uptitrate GDMT aggressively, LVAD/advanced therapies workup -Continue Entresto , Farxiga , spironolactone , digoxin   PVCs Patient started on mexiletine.  CAD Aortic atherosclerosis -Continue Zetia  and Lipitor   Fatty liver disease Noted.  Tobacco use Cessation discussed.  Obesity, class I Estimated body mass index is 33.06 kg/m as calculated from the following:   Height as of this encounter: 6\' 2"  (1.88 m).   Weight as of this encounter: 116.8 kg.   DVT prophylaxis: Eliquis  Code Status:   Code Status: Full  Code Family Communication: None at bedside Disposition Plan: Discharge home pending cardiology recommendations   Consultants:  Cardiology  Procedures:  Transthoracic Echocardiogram  Antimicrobials: Ceftriaxone  IV Azithromycin     Subjective: No concerns today. Eager to be discharged. Feels well.  Objective: BP 106/75 (BP Location: Left Arm)   Pulse 97   Temp 98 F (36.7 C) (Oral)   Resp 19   Ht 6\' 2"  (1.88 m)   Wt 116.8 kg   SpO2 100%   BMI 33.06 kg/m   Examination:  General exam: Appears calm and comfortable Respiratory system: Respiratory effort normal. Central nervous system: Alert and oriented. Psychiatry: Judgement and insight appear normal. Mood & affect appropriate.    Data Reviewed: I have personally reviewed following labs and imaging studies  CBC Lab Results  Component Value Date   WBC 11.0 (H) 08/09/2023   RBC 5.31 08/09/2023   HGB 16.0 08/09/2023   HCT 47.4 08/09/2023   MCV 89.3 08/09/2023   MCH 30.1 08/09/2023   PLT 257 08/09/2023   MCHC 33.8 08/09/2023   RDW 13.5 08/09/2023   LYMPHSABS 2.1 08/07/2023   MONOABS 1.3 (H) 08/07/2023   EOSABS 0.2 08/07/2023   BASOSABS 0.1 08/07/2023     Last metabolic panel Lab Results  Component Value Date   NA 136 08/09/2023   K 4.0 08/09/2023   CL 104 08/09/2023   CO2 23 08/09/2023   BUN 12 08/09/2023   CREATININE 0.92 08/09/2023   GLUCOSE 152 (H) 08/09/2023   GFRNONAA >60 08/09/2023   GFRAA 141 10/31/2018   CALCIUM  9.0 08/09/2023   PHOS 4.0 08/07/2023   PROT 6.8 08/07/2023   ALBUMIN 3.2 (L) 08/07/2023   BILITOT 1.6 (H) 08/07/2023   ALKPHOS 55 08/07/2023   AST 20 08/07/2023   ALT 15 08/07/2023  ANIONGAP 9 08/09/2023    GFR: Estimated Creatinine Clearance: 140.6 mL/min (by C-G formula based on SCr of 0.92 mg/dL).  Recent Results (from the past 240 hours)  Resp panel by RT-PCR (RSV, Flu A&B, Covid) Nasal Mucosa     Status: None   Collection Time: 08/05/23  9:32 AM   Specimen: Nasal  Mucosa; Nasal Swab  Result Value Ref Range Status   SARS Coronavirus 2 by RT PCR NEGATIVE NEGATIVE Final   Influenza A by PCR NEGATIVE NEGATIVE Final   Influenza B by PCR NEGATIVE NEGATIVE Final    Comment: (NOTE) The Xpert Xpress SARS-CoV-2/FLU/RSV plus assay is intended as an aid in the diagnosis of influenza from Nasopharyngeal swab specimens and should not be used as a sole basis for treatment. Nasal washings and aspirates are unacceptable for Xpert Xpress SARS-CoV-2/FLU/RSV testing.  Fact Sheet for Patients: BloggerCourse.com  Fact Sheet for Healthcare Providers: SeriousBroker.it  This test is not yet approved or cleared by the [place]  FDA and has been authorized for detection and/or diagnosis of SARS-CoV-2 by FDA under an Emergency Use Authorization (EUA). This EUA will remain in effect (meaning this test can be used) for the duration of the COVID-19 declaration under Section 564(b)(1) of the Act, 21 U.S.C. section 360bbb-3(b)(1), unless the authorization is terminated or revoked.     Resp Syncytial Virus by PCR NEGATIVE NEGATIVE Final    Comment: (NOTE) Fact Sheet for Patients: BloggerCourse.com  Fact Sheet for Healthcare Providers: SeriousBroker.it  This test is not yet approved or cleared by the [place]  FDA and has been authorized for detection and/or diagnosis of SARS-CoV-2 by FDA under an Emergency Use Authorization (EUA). This EUA will remain in effect (meaning this test can be used) for the duration of the COVID-19 declaration under Section 564(b)(1) of the Act, 21 U.S.C. section 360bbb-3(b)(1), unless the authorization is terminated or revoked.  Performed at [facility], [address] [place], [place] [address]   Culture, blood (Routine X 2) w Reflex to ID Panel     Status: None (Preliminary result)   Collection Time: 08/05/23 10:35 AM    Specimen: BLOOD  Result Value Ref Range Status   Specimen Description BLOOD SITE NOT SPECIFIED  Final   Special Requests   Final    BOTTLES DRAWN AEROBIC AND ANAEROBIC Blood Culture results may not be optimal due to an inadequate volume of blood received in culture bottles   Culture   Final    NO GROWTH 4 DAYS Performed at [facility], [address] [place], [place] [address]    Report Status PENDING  Incomplete  Culture, blood (Routine X 2) w Reflex to ID Panel     Status: None (Preliminary result)   Collection Time: 08/05/23 10:35 AM   Specimen: BLOOD  Result Value Ref Range Status   Specimen Description BLOOD SITE NOT SPECIFIED  Final   Special Requests   Final    BOTTLES DRAWN AEROBIC AND ANAEROBIC Blood Culture results may not be optimal due to an inadequate volume of blood received in culture bottles   Culture   Final    NO GROWTH 4 DAYS Performed at [facility], [address] [place], [place] [address]    Report Status PENDING  Incomplete  Expectorated Sputum Assessment w Gram Stain, Rflx to Resp Cult     Status: None   Collection Time: 08/05/23  3:00 PM   Specimen: Expectorated Sputum  Result Value Ref Range Status  Specimen Description EXPECTORATED SPUTUM  Final   Special Requests NONE  Final   Sputum evaluation   Final    Sputum specimen not acceptable for testing.  Please recollect.   Gram Stain Report Called to,Read Back By and Verified With: RN C. ACEBO [id number] @1754  FH Performed at [facility], [address], [place], [place] [address]    Report Status 08/05/2023 FINAL  Final  Surgical pcr screen     Status: None   Collection Time: 08/06/23  4:00 AM   Specimen: Nasal Mucosa; Nasal Swab  Result Value Ref Range Status   MRSA, PCR NEGATIVE NEGATIVE Final   Staphylococcus aureus NEGATIVE NEGATIVE Final    Comment: (NOTE) The Xpert SA Assay (FDA approved for NASAL specimens in patients 38 years of age and older), is one component of a  comprehensive surveillance program. It is not intended to diagnose infection nor to guide or monitor treatment. Performed at [facility], [address], [place], [place] [address]       Radiology Studies: CARDIAC CATHETERIZATION Result Date: 08/09/2023 HEMODYNAMICS: RA:       12 mmHg (mean) RV:       60/5, 12 mmHg PA:       60/40 mmHg (47 mean) PCWP: 35 mmHg (mean)    Estimated Fick CO/CI   3.08L/min, 1.27L/min/m2 Thermodilution CO/CI   4.11L/min, 1.7L/min/m2    TPG  12  mmHg     PVR  2.9 Wood Units PAPi  1.7 SVR: 1810, 2415 dynes/sec/cm-5 IMPRESSION: Right heart catheterization for acute on chronic systolic heart failure. Severely elevated left sided filling pressures Severely reduced cardiac output by assumed fick, mildly improved with TD Moderate Group II precapillary PH Elevated SVR, did not receive AM medications RECOMMENDATIONS: Uptitrate GDMT aggressively, follow coox from PICC Start LVAD/advanced therapies workup       LOS: 6 days    Aneita Keens, MD [facility] 08/09/2023, 1:45 PM   If 7PM-7AM, please contact night-coverage [email or web address]

## 2023-08-09 NOTE — Plan of Care (Signed)
  Problem: Education: Goal: Knowledge of General Education information will improve Description: Including pain rating scale, medication(s)/side effects and non-pharmacologic comfort measures Outcome: Progressing   Problem: Health Behavior/Discharge Planning: Goal: Ability to manage health-related needs will improve Outcome: Progressing   Problem: Activity: Goal: Risk for activity intolerance will decrease Outcome: Progressing   Problem: Nutrition: Goal: Adequate nutrition will be maintained Outcome: Progressing   Problem: Coping: Goal: Level of anxiety will decrease Outcome: Progressing   Problem: Elimination: Goal: Will not experience complications related to bowel motility Outcome: Progressing Goal: Will not experience complications related to urinary retention Outcome: Progressing   Problem: Pain Managment: Goal: General experience of comfort will improve and/or be controlled Outcome: Progressing

## 2023-08-10 ENCOUNTER — Other Ambulatory Visit (HOSPITAL_COMMUNITY): Payer: Self-pay

## 2023-08-10 ENCOUNTER — Ambulatory Visit: Payer: Self-pay | Admitting: Internal Medicine

## 2023-08-10 DIAGNOSIS — I5023 Acute on chronic systolic (congestive) heart failure: Secondary | ICD-10-CM

## 2023-08-10 LAB — HIV ANTIBODY (ROUTINE TESTING W REFLEX): HIV Screen 4th Generation wRfx: NONREACTIVE

## 2023-08-10 LAB — BASIC METABOLIC PANEL WITH GFR
Anion gap: 15 (ref 5–15)
BUN: 20 mg/dL (ref 6–20)
CO2: 22 mmol/L (ref 22–32)
Calcium: 9 mg/dL (ref 8.9–10.3)
Chloride: 97 mmol/L — ABNORMAL LOW (ref 98–111)
Creatinine, Ser: 1.15 mg/dL (ref 0.61–1.24)
GFR, Estimated: >60 mL/min (ref >60)
Glucose, Bld: 127 mg/dL — ABNORMAL HIGH (ref 70–99)
Potassium: 3.9 mmol/L (ref 3.5–5.1)
Sodium: 134 mmol/L — ABNORMAL LOW (ref 135–145)

## 2023-08-10 LAB — LIPID PANEL
Cholesterol: 104 mg/dL (ref 0–200)
HDL: 28 mg/dL — ABNORMAL LOW (ref >40)
LDL Cholesterol: 58 mg/dL (ref 0–99)
Total CHOL/HDL Ratio: 3.7 ratio
Triglycerides: 92 mg/dL (ref <150)
VLDL: 18 mg/dL (ref 0–40)

## 2023-08-10 LAB — ANTITHROMBIN III: AntiThromb III Func: 90 % (ref 75–120)

## 2023-08-10 LAB — TSH: TSH: 5 u[IU]/mL — ABNORMAL HIGH (ref 0.350–4.500)

## 2023-08-10 LAB — CBC
HCT: 52.1 % — ABNORMAL HIGH (ref 39.0–52.0)
Hemoglobin: 17.9 g/dL — ABNORMAL HIGH (ref 13.0–17.0)
MCH: 30.2 pg (ref 26.0–34.0)
MCHC: 34.4 g/dL (ref 30.0–36.0)
MCV: 88 fL (ref 80.0–100.0)
Platelets: 340 10*3/uL (ref 150–400)
RBC: 5.92 MIL/uL — ABNORMAL HIGH (ref 4.22–5.81)
RDW: 13.4 % (ref 11.5–15.5)
WBC: 10.8 10*3/uL — ABNORMAL HIGH (ref 4.0–10.5)
nRBC: 0 % (ref 0.0–0.2)

## 2023-08-10 LAB — CULTURE, BLOOD (ROUTINE X 2)
Culture: NO GROWTH
Culture: NO GROWTH

## 2023-08-10 LAB — HEMOGLOBIN A1C
Hgb A1c MFr Bld: 5.6 % (ref 4.8–5.6)
Mean Plasma Glucose: 114.02 mg/dL

## 2023-08-10 LAB — COOXEMETRY PANEL
Carboxyhemoglobin: 1.2 % (ref 0.5–1.5)
Methemoglobin: <0.7 % (ref 0.0–1.5)
O2 Saturation: 57.9 %
Total hemoglobin: 17.5 g/dL — ABNORMAL HIGH (ref 12.0–16.0)

## 2023-08-10 LAB — APTT: aPTT: 34 s (ref 24–36)

## 2023-08-10 LAB — URIC ACID: Uric Acid, Serum: 7.2 mg/dL (ref 3.7–8.6)

## 2023-08-10 LAB — PREALBUMIN: Prealbumin: 15 mg/dL — ABNORMAL LOW (ref 18–38)

## 2023-08-10 LAB — T4, FREE: Free T4: 0.93 ng/dL (ref 0.61–1.12)

## 2023-08-10 LAB — LACTATE DEHYDROGENASE: LDH: 213 U/L — ABNORMAL HIGH (ref 98–192)

## 2023-08-10 LAB — TYPE AND SCREEN
ABO/RH(D): B POS
Antibody Screen: NEGATIVE

## 2023-08-10 LAB — HEPATITIS B SURFACE ANTIGEN: Hepatitis B Surface Ag: NONREACTIVE

## 2023-08-10 LAB — HEPATITIS C ANTIBODY: HCV Ab: NONREACTIVE

## 2023-08-10 LAB — MAGNESIUM: Magnesium: 2.2 mg/dL (ref 1.7–2.4)

## 2023-08-10 MED ORDER — COLCHICINE 0.6 MG PO TABS
0.6000 mg | ORAL_TABLET | Freq: Every day | ORAL | 0 refills | Status: AC
  Filled 2023-08-10 (×2): qty 7, 7d supply, fill #0

## 2023-08-10 MED ORDER — APIXABAN 5 MG PO TABS
ORAL_TABLET | ORAL | 0 refills | Status: DC
  Filled 2023-08-10: qty 182, 90d supply, fill #0
  Filled 2023-08-10: qty 180, 90d supply, fill #0

## 2023-08-10 MED ORDER — AMIODARONE HCL 200 MG PO TABS
ORAL_TABLET | ORAL | 0 refills | Status: DC
  Filled 2023-08-10 (×2): qty 94, 72d supply, fill #0

## 2023-08-10 MED ORDER — SACUBITRIL-VALSARTAN 97-103 MG PO TABS
1.0000 | ORAL_TABLET | Freq: Two times a day (BID) | ORAL | 2 refills | Status: AC
  Filled 2023-08-10 (×2): qty 60, 30d supply, fill #0

## 2023-08-10 MED ORDER — TORSEMIDE 20 MG PO TABS
20.0000 mg | ORAL_TABLET | Freq: Every day | ORAL | 2 refills | Status: DC
  Filled 2023-08-10 (×2): qty 30, 30d supply, fill #0

## 2023-08-10 MED ORDER — MEXILETINE HCL 150 MG PO CAPS
300.0000 mg | ORAL_CAPSULE | Freq: Two times a day (BID) | ORAL | 1 refills | Status: DC
  Filled 2023-08-10 (×2): qty 120, 30d supply, fill #0

## 2023-08-10 NOTE — Plan of Care (Signed)

## 2023-08-10 NOTE — Progress Notes (Signed)
 Patient ID: Reginald Fernandez, male   DOB: 1980-09-06, 43 y.o.   MRN: [id number]     Advanced Heart Failure Rounding Note  Cardiologist: Randene Bustard, MD  Chief Complaint: Low output Heart Failure Subjective:    Off Milrinone , co-ox 58%.  He diuresed well yesterday, I/Os net negative 2475.  PICC line is now out so unable to measure CVP.  He walked in the hall without dyspnea.  No BMET this morning.   He is very eager to go home.   RHC: mean RA 12, PA 60/40, mean PCWP 35, CI 1.7 thermo, SVR 1810 (had not had am meds)   Objective:    Weight Range: 114 kg Body mass index is 32.27 kg/m.   Vital Signs:   Temp:  [97.7 F (36.5 C)-98.5 F (36.9 C)] 98.4 F (36.9 C) (05/24 1056) Pulse Rate:  [50-97] 86 (05/24 0715) Resp:  [18-23] 19 (05/24 0326) BP: (96-130)/(62-102) 113/102 (05/24 1056) SpO2:  [93 %-100 %] 99 % (05/24 0715) Weight:  [295 kg] 114 kg (05/24 0609) Last BM Date : 08/09/23  Weight change: Filed Weights   08/08/23 0440 08/09/23 0454 08/10/23 0609  Weight: 118.2 kg 116.8 kg 114 kg   Intake/Output: Intake/Output Summary (Last 24 hours) at 08/10/2023 1141 Last data filed at 08/10/2023 0030 Gross per 24 hour  Intake 960 ml  Output 3675 ml  Net -2715 ml    Physical Exam    General: NAD Neck: No JVD, no thyromegaly or thyroid nodule.  Lungs: Clear to auscultation bilaterally with normal respiratory effort. CV: Lateral PMI.  Heart regular S1/S2, no S3/S4, no murmur.  No peripheral edema.    Abdomen: Soft, nontender, no hepatosplenomegaly, no distention.  Skin: Intact without lesions or rashes.  Neurologic: Alert and oriented x 3.  Psych: Normal affect. Extremities: No clubbing or cyanosis.  HEENT: Normal.   Telemetry   NSR with PVCs (personally reviewed)  EKG    N/A  Labs    CBC Recent Labs    08/08/23 0450 08/09/23 0610 08/09/23 1042  WBC 9.6 11.0*  --   HGB 14.9 16.0 15.0  15.6  HCT 44.3 47.4 44.0  46.0  MCV 89.5 89.3  --   PLT 217 257  --     Basic Metabolic Panel Recent Labs    28/41/32 0450 08/09/23 0610 08/09/23 1042  NA 136 136 141  140  K 3.6 4.0 3.9  4.2  CL 101 104  --   CO2 28 23  --   GLUCOSE 106* 152*  --   BUN 13 12  --   CREATININE 0.84 0.92  --   CALCIUM  8.2* 9.0  --   MG 2.2 2.2  --    Liver Function Tests No results for input(s): "AST", "ALT", "ALKPHOS", "BILITOT", "PROT", "ALBUMIN" in the last 72 hours.  BNP (last 3 results) Recent Labs    08/03/23 1028  BNP 892.8*   Medications:    Scheduled Medications:  amiodarone  400 mg Oral BID   Followed by   Cecily Cohen ON 08/15/2023] amiodarone  400 mg Oral Daily   Followed by   Cecily Cohen ON 08/22/2023] amiodarone  200 mg Oral Daily   apixaban   10 mg Oral BID   Followed by   Cecily Cohen ON 08/11/2023] apixaban   5 mg Oral BID   aspirin  EC  81 mg Oral Daily   atorvastatin   80 mg Oral q1800   Chlorhexidine  Gluconate Cloth  6 each Topical Daily   colchicine   0.6 mg Oral Daily   dapagliflozin  propanediol  10 mg Oral Daily   digoxin   0.125 mg Oral Daily   ezetimibe   10 mg Oral Daily   guaiFENesin   600 mg Oral BID   mexiletine  300 mg Oral Q12H   pantoprazole   40 mg Oral Daily   polyethylene glycol  17 g Oral BID   sacubitril -valsartan   1 tablet Oral BID   sodium chloride  flush  10-40 mL Intracatheter Q12H   spironolactone   25 mg Oral Daily   torsemide   20 mg Oral Daily    Infusions:    PRN Medications: [COMPLETED] acetaminophen  **FOLLOWED BY** acetaminophen , melatonin, oxyCODONE  **OR** oxyCODONE , polyethylene glycol, prochlorperazine , sodium chloride  flush  Patient Profile   Reginald Fernandez is a 43 y.o. male w/ h/o HFrEF, premature CAD s/p multiple MIs, PE/DVT, HTN, HLD, fatty liver disease, tobacco use and poor compliance.   Assessment/Plan   1. Acute on Chronic systolic CHF: MIxed ischemic/nonischemic cardiomyopathy  (HTN and PVCs likely play a role). Echo 7/23 EF < 20%, RV severely reduced. [facility] 7/23 demonstrated diffuse CAD, mildly elevated PCWP  w/ pulmonary venous hypertension, normal RA pressure and preserved CO.  - Echo this admission EF <20 , severely reduced RV function [facility] - Had been off HF medications prior to admission. Compliance remains an issue. - Low output HF. Initial co-ox 48%. Started on Milrinone  0.25. Increased to 0.375 on 5/20. Now off.  Repeat RHC in setting of high SVR (had not had meds yet) on 5/23 showed elevated filling pressures with CI 1.7 by thermo.  - Patient diuresed well yesterday, I/Os net negative 2475.  Now on po torsemide  20 mg daily.  - Co-ox 58% this morning.  - Continue Entresto  97/103 mg bid for hypertension - Continue Farxiga  10 mg daily - Continue Spiro 25 mg daily  - Continue digoxin  0.125 mg daily   - hold ? blocker with low output - Needs ICD, however has not made appointment with previous referrals to EP - Worry that he is end-stage and need to consider advanced therapies. Cardiac output remains marginal with co-ox 58% but end organ function stable (creatinine not elevated) and he feels good.  RHC yesterday was done pre-am meds and SVR was very high.  Will not use home milrinone .  He will need close followup as outpatient.  With RV dysfunction and age, heart transplant would be ideal.  However, he vapes and must quit this.  Also must be compliant with followup.  LVAD would be an option if needed.   2. Acute DVT/PE: H/o prior PE in 3/24.  - CTA chest 5/17 minimal clot burden PE in RML and LLL. Had been off of Eliquis  for at least a month.  - repeat CTA chest with stable clot burden and RML opacity appears more concerning for pulmonary hemorrhage then PNA - continue Eliquis  load 10 mg bid x3, then 5 mg bid   3. Pleuritic chest pain: Noted opacity on CTPE in RLL. Leukocytosis. WBC up to 18>15>10 today  - Findings on imaging more concerning for hemorrhage rather than PNA on follow-up imaging  - continue colchicine  0.6 mg daily; CRP 20 and ESR 36 - procal 0.11, cultures negative - completed 5 day  course abx per primary   4. CAD: Strong family history of premature CAD.  H/o inferolateral STEMI in 9/15 with DES to LCx into OM1 and DES to RCA. LHC 7/23 demonstrated diffuse CAD. - hs-trop 30>29. Not ACS. - Continue atorva 80 mg + Zetia   10 mg daily - Can stop ASA with Eliquis  use.    5. PVCs:  - Continues with high PVC burden, 20-30%, hopefully this will improve with weaning inotrope - continue mexiletine 300 mg bid - continue amio 400 mg bid for 7 days, then 400 mg daily x7 days, than 200 mg daily - hold ? blocker with low output - eventually would need EP eval for ICD +/- PVC ablation but HF may be too far advanced for benefit - will place Zio AT at discharge.    6. HTN: BP improving - GDMT as above.  7. Disposition: I think he is stable for home today.  Has followup in CHF clinic.  Meds for home: Amiodarone taper as above, Eliquis  load/maintenance as above, Entresto  97/103 bid, spironolactone  25 daily, torsemide  20 mg daily, Farxiga  10 daily, digoxin  0.125 daily, atorvastatin  80 daily, Zetia  10 daily, mexiletine 300 bid, colchicine  0.6 daily x 1 week then stop.    Length of Stay: 7  Peder Bourdon, MD  08/10/2023, 11:41 AM  Advanced Heart Failure Team Pager [phone] (M-F; 7a - 5p)  Please contact [facility] for night-coverage after hours (5p -7a ) and weekends on [email or web address]

## 2023-08-10 NOTE — Progress Notes (Signed)
PICC line was removed per order with no complications.  Pt supine and suspended inspiration during removal with instructions to remain supine for 30 minutes after removal.  Pressure held to achieve hemostasis.  Vaseline/gauze/tegaderm applied.  Patient education provided regarding lifting restrictions, site care, and signs of infection.  Pt verbalized understanding and had no questions.

## 2023-08-10 NOTE — Discharge Instructions (Signed)
.  nbame,  You were in the hospital because of trouble breathing and were found to have evidence of blood clots in your lung arteries (Pulmonary Embolism). You have improved with blood thinners. While you were here, you were also found to have pneumonia and acute heart failure. Your pneumonia was treated with antibiotics and your heart failure was managed with diuretics and other medications to help your heart pump more efficiently. Your medications have changed; please adhere to your new regimen. Please follow-up with your PCP and your cardiologist.

## 2023-08-10 NOTE — Discharge Summary (Signed)
 Physician Discharge Summary   Patient: Reginald Fernandez MRN: [id number] DOB: 06-21-1980  Admit date:     08/03/2023  Discharge date: 08/10/23  Discharge Physician: Aneita Keens, MD   PCP: Lawrance Presume, MD   Recommendations at discharge:  PCP visit for hospital follow-up Cardiology visit for hospital follow-up  Discharge Diagnoses: Principal Problem:   Pulmonary emboli [facility]) Active Problems:   Pneumonia of right lower lobe due to infectious organism   Obesity, class 1  Resolved Problems:   * No resolved hospital problems. *  Hospital Course: Reginald Fernandez is a 43 y.o. male with a history of CAD s/p PCI, hypertension, hyperlipidemia, morbid obesity.  Patient presented secondary to dyspnea on exertion with evidence of acute PE and pneumonia. Patient was managed on heparin  IV and antibiotics During workup, patient with evidence of acute heart failure from known severe HFrEF. Cardiology consulted for management; patient started on diuresis and milrinone  with improvement of symptoms. Goal directed medical therapy for management of heart failure adjusted. Patient to discharge on Eliquis  as well.  Assessment and Plan:  Acute pulmonary embolism Patient with pleuritic chest pain with evidence of segmental pulmonary emboli within the right middle and left lower lobes on CTA chest. Patient started on heparin  IV for management. Transthoracic Echocardiogram significant for severe RV dysfunction, however unlikely related to acute PE with level of clot burden. Patient transitioned to Eliquis . Continue Eliquis  on discharge.   RLL pneumonia Right lower lobe ground-glass opacity noted on CT imaging. Patient started on Ceftriaxone  and azithromycin  and completed treatment.   Acute HFrEF Transthoracic Echocardiogram this admission significant for an LVEF less than 20% which is stable from prior Transthoracic Echocardiogram.. Cardiology managing with diuresis and milrinone . Concern for end-stage  disease. Right heart catheterization performed on 5/23. Amiodarone with taper started. Patient to also discharge on Entresto , Farxiga , spironolactone , digoxin .   PVCs Patient started on mexiletine.   CAD Aortic atherosclerosis Continue Zetia  and Lipitor    Fatty liver disease Noted.   Tobacco use Cessation discussed.   Obesity, class I Estimated body mass index is 32.27 kg/m as calculated from the following:   Height as of this encounter: 6\' 2"  (1.88 m).   Weight as of this encounter: 114 kg.   Consultants:  Cardiology   Procedures:  Transthoracic Echocardiogram Right heart catheterizatino  Disposition: Home Diet recommendation: Low-sodium diet   DISCHARGE MEDICATION: Allergies as of 08/10/2023       Reactions   Penicillins Other (See Comments)   Childhood allergy Unknown reaction Did it involve swelling of the face/tongue/throat, SOB, or low BP? No Did it involve sudden or severe rash/hives, skin peeling, or any reaction on the inside of your mouth or nose? No Did you need to seek medical attention at a hospital or doctor's office? No When did it last happen?      child If all above answers are "NO", may proceed with cephalosporin use.        Medication List     STOP taking these medications    Aspirin  Low Dose 81 MG tablet Generic drug: aspirin  EC   carvedilol  3.125 MG tablet Commonly known as: COREG    Entresto  24-26 MG Generic drug: sacubitril -valsartan  Replaced by: Entresto  97-103 MG   furosemide  20 MG tablet Commonly known as: LASIX        TAKE these medications    acetaminophen  325 MG tablet Commonly known as: TYLENOL  Take 2 tablets (650 mg total) by mouth every 6 (six) hours as needed for mild  pain (or Fever >/= 101).   amiodarone 200 MG tablet Commonly known as: PACERONE Take 2 tablets (400 mg total) by mouth 2 (two) times daily for 5 days, THEN 2 tablets (400 mg total) daily for 7 days, THEN 1 tablet (200 mg total) daily. Start  taking on: Aug 10, 2023   atorvastatin  80 MG tablet Commonly known as: LIPITOR  Take 1 tablet (80 mg total) by mouth daily at 6 PM   colchicine  0.6 MG tablet Take 1 tablet (0.6 mg total) by mouth daily for 7 days. Start taking on: Aug 11, 2023   digoxin  0.125 MG tablet Commonly known as: LANOXIN  Take 1 tablet (0.125 mg total) by mouth daily.   docusate sodium  100 MG capsule Commonly known as: COLACE Take 1 capsule (100 mg total) by mouth every 12 (twelve) hours. What changed:  when to take this reasons to take this   Eliquis  5 MG Tabs tablet Generic drug: apixaban  Take 2 tablets (10 mg total) by mouth 2 (two) times daily for 1 day, THEN 1 tablet (5 mg total) 2 (two) times daily. Start taking on: Aug 10, 2023 What changed: See the new instructions.   Entresto  97-103 MG Generic drug: sacubitril -valsartan  Take 1 tablet by mouth 2 (two) times daily. Replaces: Entresto  24-26 MG   ezetimibe  10 MG tablet Commonly known as: ZETIA  Take 1 tablet (10 mg total) by mouth daily.   Farxiga  10 MG Tabs tablet Generic drug: dapagliflozin  propanediol Take 1 tablet (10 mg total) by mouth daily.   mexiletine 150 MG capsule Commonly known as: MEXITIL  Take 2 capsules (300 mg total) by mouth 2 (two) times daily.   nitroGLYCERIN  0.4 MG SL tablet Commonly known as: Nitrostat  Place 1 tablet (0.4 mg total) under the tongue every 5 (five) minutes as needed for chest pain.   pantoprazole  40 MG tablet Commonly known as: PROTONIX  Take 1 tablet (40 mg total) by mouth daily. What changed: additional instructions   senna-docusate 8.6-50 MG tablet Commonly known as: Senokot-S Take 1 tablet by mouth 2 (two) times daily.   spironolactone  25 MG tablet Commonly known as: ALDACTONE  Take 1 tablet (25 mg total) by mouth daily.   torsemide  20 MG tablet Commonly known as: DEMADEX  Take 1 tablet (20 mg total) by mouth daily. Start taking on: Aug 11, 2023 What changed: when to take this   traMADol   50 MG tablet Commonly known as: ULTRAM  Take 1 tablet (50 mg total) by mouth every 6 (six) hours as needed.        Follow-up Information     [facility] Follow up on 08/16/2023.   Specialty: Cardiology Why: at 8:30a Arlin Benes, Entrance C Contact information: [address] [place]  [address] [phone]        Lawrance Presume, MD. Schedule an appointment as soon as possible for a visit in 1 week(s).   Specialty: Internal Medicine Why: For hospital follow-up Contact information: [address] [place] [address] [phone]                Discharge Exam: BP (!) 113/102 (BP Location: Left Arm)   Pulse 86   Temp 98.4 F (36.9 C) (Oral)   Resp 19   Ht 6\' 2"  (1.88 m)   Wt 114 kg   SpO2 99%   BMI 32.27 kg/m   General exam: Appears calm and comfortable Respiratory system: Respiratory effort normal. Central nervous system: Alert and oriented.  Psychiatry: Judgement and insight appear normal. Mood & affect appropriate.   Condition at discharge: stable  The results of significant diagnostics from this hospitalization (including imaging, microbiology, ancillary and laboratory) are listed below for reference.   Imaging Studies: DG Orthopantogram Result Date: 08/09/2023 CLINICAL DATA:  Preop eval, planned heart surgery EXAM: ORTHOPANTOGRAM/PANORAMIC COMPARISON:  None Available. FINDINGS: Multiple dental fillings. No suspicious apical lucencies. No obvious tooth fracture or dental caries by radiography. IMPRESSION: Multiple dental fillings.  Otherwise negative examination Electronically Signed   By: Esmeralda Hedge M.D.   On: 08/09/2023 19:42   CARDIAC CATHETERIZATION Result Date: 08/09/2023 HEMODYNAMICS: RA:       12 mmHg (mean) RV:       60/5, 12 mmHg PA:       60/40 mmHg (47 mean) PCWP: 35 mmHg (mean)    Estimated Fick CO/CI   3.08L/min, 1.27L/min/m2 Thermodilution CO/CI   4.11L/min,  1.7L/min/m2    TPG  12  mmHg     PVR  2.9 Wood Units PAPi  1.7 SVR: 1810, 2415 dynes/sec/cm-5 IMPRESSION: Right heart catheterization for acute on chronic systolic heart failure. Severely elevated left sided filling pressures Severely reduced cardiac output by assumed fick, mildly improved with TD Moderate Group II precapillary PH Elevated SVR, did not receive AM medications RECOMMENDATIONS: Uptitrate GDMT aggressively, follow coox from PICC Start LVAD/advanced therapies workup   CT Angio Chest Pulmonary Embolism (PE) W or WO Contrast Result Date: 08/05/2023 CLINICAL DATA:  Shortness of breath and history of pulmonary embolus EXAM: CT ANGIOGRAPHY CHEST WITH CONTRAST TECHNIQUE: Multidetector CT imaging of the chest was performed using the standard protocol during bolus administration of intravenous contrast. Multiplanar CT image reconstructions and MIPs were obtained to evaluate the vascular anatomy. RADIATION DOSE REDUCTION: This exam was performed according to the departmental dose-optimization program which includes automated exposure control, adjustment of the mA and/or kV according to patient size and/or use of iterative reconstruction technique. CONTRAST:  75mL OMNIPAQUE  IOHEXOL  350 MG/ML SOLN COMPARISON:  08/03/2023 FINDINGS: Despite efforts by the technologist and patient, motion artifact is present on today's exam and could not be eliminated. This reduces exam sensitivity and specificity. Cardiovascular: Small lateral segmental right middle lobe acute pulmonary embolus with localized airspace opacity possibly from pulmonary hemorrhage. Stable posterior basal segmental pulmonary embolus in the left lower lobe compared to 08/03/2023. No new pulmonary embolus identified. Stable moderate cardiomegaly. Aberrant right subclavian artery passes behind the esophagus. Coronary atherosclerotic calcifications. Mediastinum/Nodes: 1.0 cm right paratracheal node on image 30 series 5, previously 1.1 cm. This node is  borderline enlarged. Lungs/Pleura: In addition to the airspace opacity in the lateral segment of the right middle lobe, there is some faint airspace opacity anteriorly in the right lower lobe. Upper Abdomen: Unremarkable Musculoskeletal: Mild to moderate degenerative left glenohumeral arthropathy. Mild lower thoracic spondylosis. Review of the MIP images confirms the above findings. IMPRESSION: 1. No change in appearance or distribution of segmental pulmonary emboli in the lateral segment right middle lobe and posterior basal segment left lower lobe. 2. Continued right middle lobe airspace opacity probably from pulmonary hemorrhage given the correlation with the embolus. There is also some faint ground-glass opacity in the right lower lobe and superimposed minimal right lower lobe pneumonia is not excluded. 3. Stable moderate cardiomegaly. 4. Aberrant right subclavian artery passes behind the esophagus. 5. Coronary atherosclerotic calcifications. 6. Mild to moderate degenerative left glenohumeral arthropathy. 7. Mild lower thoracic spondylosis. 8.  Aortic Atherosclerosis (ICD10-I70.0). Electronically Signed   By: Freida Jes  M.D.   On: 08/05/2023 18:31   VAS US  LOWER EXTREMITY VENOUS (DVT) Result Date: 08/05/2023  Lower Venous DVT Study Patient Name:  RENEE BEALE  Date of Exam:   08/04/2023 Medical Rec #: [id number]       Accession #:    [id number] Date of Birth: February 09, 1981       Patient Gender: M Patient Age:   24 years Exam Location:  [facility] Procedure:      VAS US  LOWER EXTREMITY VENOUS (DVT) Referring Phys: Arne Langdon --------------------------------------------------------------------------------  Indications: Pulmonary embolism.  Risk Factors: PE/DVT (2024) - patient stopped taking Eliquis  2 months ago. Comparison Study: Previous exam on 06/05/2022 was positive for DVT - RT PopV & LT                   FV and PTV. Performing Technologist: Arlyce Berger RVT, RDMS  Examination Guidelines: A  complete evaluation includes B-mode imaging, spectral Doppler, color Doppler, and power Doppler as needed of all accessible portions of each vessel. Bilateral testing is considered an integral part of a complete examination. Limited examinations for reoccurring indications may be performed as noted. The reflux portion of the exam is performed with the patient in reverse Trendelenburg.  +---------+---------------+---------+-----------+----------+--------------+ RIGHT    CompressibilityPhasicitySpontaneityPropertiesThrombus Aging +---------+---------------+---------+-----------+----------+--------------+ CFV      Full           No       Yes                                 +---------+---------------+---------+-----------+----------+--------------+ SFJ      Full                                                        +---------+---------------+---------+-----------+----------+--------------+ FV Prox  Full           No       Yes                                 +---------+---------------+---------+-----------+----------+--------------+ FV Mid   Full           No       Yes                                 +---------+---------------+---------+-----------+----------+--------------+ FV DistalFull           No       Yes                                 +---------+---------------+---------+-----------+----------+--------------+ PFV      Full                                                        +---------+---------------+---------+-----------+----------+--------------+ POP      Full           No       Yes                                 +---------+---------------+---------+-----------+----------+--------------+  PTV      Full                                                        +---------+---------------+---------+-----------+----------+--------------+ PERO     Full                                                         +---------+---------------+---------+-----------+----------+--------------+ diminished, pulsatile flow throughout extremity  +---------+---------------+---------+-----------+----------+-----------------+ LEFT     CompressibilityPhasicitySpontaneityPropertiesThrombus Aging    +---------+---------------+---------+-----------+----------+-----------------+ CFV      Full           No       Yes                                    +---------+---------------+---------+-----------+----------+-----------------+ SFJ      Full                                                           +---------+---------------+---------+-----------+----------+-----------------+ FV Prox  Full           No       Yes                                    +---------+---------------+---------+-----------+----------+-----------------+ FV Mid   Full           No       Yes                                    +---------+---------------+---------+-----------+----------+-----------------+ FV DistalFull           No       Yes                                    +---------+---------------+---------+-----------+----------+-----------------+ PFV      Full                                                           +---------+---------------+---------+-----------+----------+-----------------+ POP      Full           No       Yes                                    +---------+---------------+---------+-----------+----------+-----------------+ PTV      None           No       No  Age Indeterminate +---------+---------------+---------+-----------+----------+-----------------+ PERO     Full                                                           +---------+---------------+---------+-----------+----------+-----------------+ pulsatile flow throughout extremity    Summary: BILATERAL: -No evidence of popliteal cyst, bilaterally. RIGHT: - There is no evidence of deep vein thrombosis in the  lower extremity.  LEFT: - Findings consistent with age indeterminate deep vein thrombosis involving the left posterior tibial veins.   *See table(s) above for measurements and observations. Electronically signed by Genny Kid MD on 08/05/2023 at 3:42:51 PM.    Final    US  EKG SITE RITE Result Date: 08/05/2023 If Site Rite image not attached, placement could not be confirmed due to current cardiac rhythm.  ECHOCARDIOGRAM COMPLETE Result Date: 08/04/2023    ECHOCARDIOGRAM REPORT   Patient Name:   SHAHMEER BUNN Date of Exam: 08/04/2023 Medical Rec #:  [id number]      Height:       74.0 in Accession #:    [id number]     Weight:       246.0 lb Date of Birth:  05/08/1980      BSA:          2.373 m Patient Age:    43 years       BP:           106/75 mmHg Patient Gender: M              HR:           96 bpm. Exam Location:  Inpatient Procedure: 2D Echo, Cardiac Doppler, Color Doppler and Intracardiac            Opacification Agent (Both Spectral and Color Flow Doppler were            utilized during procedure). Indications:    Pulmonary Embolus  History:        Patient has prior history of Echocardiogram examinations, most                 recent 06/04/2022. Risk Factors:Hypertension.  Sonographer:    Janette Medley Referring Phys: [phone] A CALDWELL POWELL JR IMPRESSIONS  1. Left ventricular ejection fraction, by estimation, is <20%. The left ventricle has severely decreased function. The left ventricle demonstrates global hypokinesis. The left ventricular internal cavity size was severely dilated. Left ventricular diastolic parameters are indeterminate.  2. Right ventricular systolic function is severely reduced. The right ventricular size is severely enlarged. There is mildly elevated pulmonary artery systolic pressure. The estimated right ventricular systolic pressure is 40.4 mmHg.  3. Left atrial size was severely dilated.  4. Right atrial size was severely dilated.  5. The mitral valve is normal in structure. Trivial  mitral valve regurgitation. No evidence of mitral stenosis.  6. The aortic valve is tricuspid. There is mild calcification of the aortic valve. Aortic valve regurgitation is not visualized. No aortic stenosis is present.  7. The inferior vena cava is dilated in size with <50% respiratory variability, suggesting right atrial pressure of 15 mmHg. FINDINGS  Left Ventricle: Left ventricular ejection fraction, by estimation, is <20%. The left ventricle has severely decreased function. The left ventricle demonstrates global hypokinesis. The left ventricular internal cavity size was severely dilated. There is no left  ventricular hypertrophy. Left ventricular diastolic parameters are indeterminate. Right Ventricle: The right ventricular size is severely enlarged. No increase in right ventricular wall thickness. Right ventricular systolic function is severely reduced. There is mildly elevated pulmonary artery systolic pressure. The tricuspid regurgitant velocity is 2.52 m/s, and with an assumed right atrial pressure of 15 mmHg, the estimated right ventricular systolic pressure is 40.4 mmHg. Left Atrium: Left atrial size was severely dilated. Right Atrium: Right atrial size was severely dilated. Pericardium: There is no evidence of pericardial effusion. Mitral Valve: The mitral valve is normal in structure. Trivial mitral valve regurgitation. No evidence of mitral valve stenosis. Tricuspid Valve: The tricuspid valve is normal in structure. Tricuspid valve regurgitation is trivial. No evidence of tricuspid stenosis. Aortic Valve: The aortic valve is tricuspid. There is mild calcification of the aortic valve. Aortic valve regurgitation is not visualized. No aortic stenosis is present. Pulmonic Valve: The pulmonic valve was normal in structure. Pulmonic valve regurgitation is trivial. No evidence of pulmonic stenosis. Aorta: The aortic root is normal in size and structure. Venous: The inferior vena cava is dilated in size with  less than 50% respiratory variability, suggesting right atrial pressure of 15 mmHg. IAS/Shunts: No atrial level shunt detected by color flow Doppler.  LEFT VENTRICLE PLAX 2D LVIDd:         6.60 cm   Diastology LVIDs:         5.90 cm   LV e' lateral: 7.77 cm/s LV PW:         1.10 cm LV IVS:        1.10 cm LVOT diam:     2.10 cm LV SV:         50 LV SV Index:   21 LVOT Area:     3.46 cm  RIGHT VENTRICLE             IVC RV S prime:     11.00 cm/s  IVC diam: 2.80 cm TAPSE (M-mode): 2.8 cm LEFT ATRIUM              Index        RIGHT ATRIUM           Index LA diam:        4.20 cm  1.77 cm/m   RA Area:     28.70 cm LA Vol (A2C):   113.0 ml 47.61 ml/m  RA Volume:   98.50 ml  41.50 ml/m LA Vol (A4C):   106.0 ml 44.66 ml/m LA Biplane Vol: 114.0 ml 48.03 ml/m  AORTIC VALVE LVOT Vmax:   101.00 cm/s LVOT Vmean:  63.700 cm/s LVOT VTI:    0.145 m  AORTA Ao Root diam: 3.20 cm Ao Asc diam:  3.50 cm TRICUSPID VALVE TR Peak grad:   25.4 mmHg TR Vmax:        252.00 cm/s  SHUNTS Systemic VTI:  0.14 m Systemic Diam: 2.10 cm Jules Oar MD Electronically signed by Jules Oar MD Signature Date/Time: 08/04/2023/3:42:15 PM    Final    CT Angio Chest PE W/Cm &/Or Wo Cm Result Date: 08/03/2023 CLINICAL DATA:  Short of breath, cough EXAM: CT ANGIOGRAPHY CHEST WITH CONTRAST TECHNIQUE: Multidetector CT imaging of the chest was performed using the standard protocol during bolus administration of intravenous contrast. Multiplanar CT image reconstructions and MIPs were obtained to evaluate the vascular anatomy. RADIATION DOSE REDUCTION: This exam was performed according to the departmental dose-optimization program which includes automated exposure control, adjustment of the mA  and/or kV according to patient size and/or use of iterative reconstruction technique. CONTRAST:  75mL ISOVUE -370 IOPAMIDOL  (ISOVUE -370) INJECTION 76% COMPARISON:  08/03/2023, 06/05/2022 FINDINGS: Cardiovascular: This is a technically adequate evaluation of  the pulmonary vasculature. Segmental pulmonary emboli are seen within the right middle and left lower lobes, with minimal clot burden. No other filling defects. No right heart strain. The heart is enlarged without pericardial effusion. Normal caliber of the thoracic aorta. 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 Extensive atherosclerosis of the coronary vasculature. Mediastinum/Nodes: Borderline enlarged mediastinal lymph nodes, measuring up to 11 mm in the right paratracheal region. Thyroid, trachea, and esophagus are unremarkable. Lungs/Pleura: There is airspace disease within the lateral segment of the right middle lobe consistent with pneumonia. No effusion or pneumothorax. Central airways are patent. Upper Abdomen: No acute abnormality. Musculoskeletal: No acute or destructive bony abnormalities. Reconstructed images demonstrate no additional findings. Review of the MIP images confirms the above findings. IMPRESSION: 1. Minimal segmental right middle and left lower lobe pulmonary emboli, with minimal clot burden and no right heart strain. 2. Right lower lobe airspace disease most consistent with pneumonia. 3. Cardiomegaly. 4. Borderline enlarged mediastinal lymph nodes, likely reactive. 5. Extensive coronary artery atherosclerosis. Critical Value/emergent results were called by telephone at the time of interpretation on 08/03/2023 at 2:21 pm to provider [facility] , who verbally acknowledged these results. Electronically Signed   By: Bobbye Burrow M.D.   On: 08/03/2023 14:21   DG Chest 2 View Result Date: 08/03/2023 CLINICAL DATA:  Shortness of breath, cough. EXAM: CHEST - 2 VIEW COMPARISON:  June 07, 2022. FINDINGS: Stable cardiomegaly. Both lungs are clear. The visualized skeletal structures are unremarkable. IMPRESSION: No active cardiopulmonary disease. Electronically Signed   By: Rosalene Colon M.D.   On: 08/03/2023 10:44    Microbiology: Results for  orders placed or performed during the hospital encounter of 08/03/23  Resp panel by RT-PCR (RSV, Flu A&B, Covid) Nasal Mucosa     Status: None   Collection Time: 08/05/23  9:32 AM   Specimen: Nasal Mucosa; Nasal Swab  Result Value Ref Range Status   SARS Coronavirus 2 by RT PCR NEGATIVE NEGATIVE Final   Influenza A by PCR NEGATIVE NEGATIVE Final   Influenza B by PCR NEGATIVE NEGATIVE Final    Comment: (NOTE) The Xpert Xpress SARS-CoV-2/FLU/RSV plus assay is intended as an aid in the diagnosis of influenza from Nasopharyngeal swab specimens and should not be used as a sole basis for treatment. Nasal washings and aspirates are unacceptable for Xpert Xpress SARS-CoV-2/FLU/RSV testing.  Fact Sheet for Patients: BloggerCourse.com  Fact Sheet for Healthcare Providers: SeriousBroker.it  This test is not yet approved or cleared by the [place]  FDA and has been authorized for detection and/or diagnosis of SARS-CoV-2 by FDA under an Emergency Use Authorization (EUA). This EUA will remain in effect (meaning this test can be used) for the duration of the COVID-19 declaration under Section 564(b)(1) of the Act, 21 U.S.C. section 360bbb-3(b)(1), unless the authorization is terminated or revoked.     Resp Syncytial Virus by PCR NEGATIVE NEGATIVE Final    Comment: (NOTE) Fact Sheet for Patients: BloggerCourse.com  Fact Sheet for Healthcare Providers: SeriousBroker.it  This test is not yet approved or cleared by the [place]  FDA and has been authorized for detection and/or diagnosis of SARS-CoV-2 by FDA under an Emergency Use Authorization (EUA). This EUA will remain in effect (meaning this test can be used) for the  duration of the COVID-19 declaration under Section 564(b)(1) of the Act, 21 U.S.C. section 360bbb-3(b)(1), unless the authorization is terminated or revoked.  Performed  at [facility], [address] [place], [place] [address]   Culture, blood (Routine X 2) w Reflex to ID Panel     Status: None   Collection Time: 08/05/23 10:35 AM   Specimen: BLOOD  Result Value Ref Range Status   Specimen Description BLOOD SITE NOT SPECIFIED  Final   Special Requests   Final    BOTTLES DRAWN AEROBIC AND ANAEROBIC Blood Culture results may not be optimal due to an inadequate volume of blood received in culture bottles   Culture   Final    NO GROWTH 5 DAYS Performed at [facility], [address] [place], [place] [address]    Report Status 08/10/2023 FINAL  Final  Culture, blood (Routine X 2) w Reflex to ID Panel     Status: None   Collection Time: 08/05/23 10:35 AM   Specimen: BLOOD  Result Value Ref Range Status   Specimen Description BLOOD SITE NOT SPECIFIED  Final   Special Requests   Final    BOTTLES DRAWN AEROBIC AND ANAEROBIC Blood Culture results may not be optimal due to an inadequate volume of blood received in culture bottles   Culture   Final    NO GROWTH 5 DAYS Performed at [facility], [address] [place], [place] [address]    Report Status 08/10/2023 FINAL  Final  Expectorated Sputum Assessment w Gram Stain, Rflx to Resp Cult     Status: None   Collection Time: 08/05/23  3:00 PM   Specimen: Expectorated Sputum  Result Value Ref Range Status   Specimen Description EXPECTORATED SPUTUM  Final   Special Requests NONE  Final   Sputum evaluation   Final    Sputum specimen not acceptable for testing.  Please recollect.   Gram Stain Report Called to,Read Back By and Verified With: RN C. ACEBO [id number] @1754  FH Performed at [facility], [address], [place], [place] [address]    Report Status 08/05/2023 FINAL  Final  Surgical pcr screen     Status: None   Collection Time: 08/06/23  4:00 AM   Specimen: Nasal Mucosa; Nasal Swab  Result Value Ref Range Status   MRSA, PCR NEGATIVE NEGATIVE Final   Staphylococcus aureus  NEGATIVE NEGATIVE Final    Comment: (NOTE) The Xpert SA Assay (FDA approved for NASAL specimens in patients 60 years of age and older), is one component of a comprehensive surveillance program. It is not intended to diagnose infection nor to guide or monitor treatment. Performed at [facility], [address] [place], [place] [address]     Labs: CBC: Recent Labs  Lab 08/06/23 0405 08/07/23 0415 08/08/23 0450 08/09/23 0610 08/09/23 1042 08/10/23 1217  WBC 14.9* 10.1 9.6 11.0*  --  10.8*  NEUTROABS  --  6.3  --   --   --   --   HGB 14.9 14.5 14.9 16.0 15.0  15.6 17.9*  HCT 44.8 43.2 44.3 47.4 44.0  46.0 52.1*  MCV 89.6 89.8 89.5 89.3  --  88.0  PLT 195 191 217 257  --  340   Basic Metabolic Panel: Recent Labs  Lab 08/05/23 0446 08/06/23 0405 08/07/23 0415 08/08/23 0450 08/09/23 0610 08/09/23 1042 08/10/23 1217  NA 133* 132* 135 136 136 141  140 134*  K  4.2 3.7 3.3* 3.6 4.0 3.9  4.2 3.9  CL 97* 96* 97* 101 104  --  97*  CO2 25 27 27 28 23   --  22  GLUCOSE 108* 132* 129* 106* 152*  --  127*  BUN 16 16 15 13 12   --  20  CREATININE 1.04 0.98 0.94 0.84 0.92  --  1.15  CALCIUM  8.9 8.7* 8.4* 8.2* 9.0  --  9.0  MG 2.1  --  2.1 2.2 2.2  --  2.2  PHOS 3.8  --  4.0  --   --   --   --    Liver Function Tests: Recent Labs  Lab 08/05/23 0446 08/06/23 0405 08/07/23 0415  AST 18 18 20   ALT 15 15 15   ALKPHOS 64 63 55  BILITOT 5.6* 3.6* 1.6*  PROT 8.1 7.4 6.8  ALBUMIN 4.0 3.5 3.2*     Discharge time spent: 35 minutes.  Signed: Aneita Keens, MD [facility] 08/10/2023

## 2023-08-11 LAB — LUPUS ANTICOAGULANT PANEL
DRVVT: 83.4 s — ABNORMAL HIGH (ref 0.0–47.0)
PTT Lupus Anticoagulant: 34.2 s (ref 0.0–43.5)

## 2023-08-11 LAB — DRVVT CONFIRM: dRVVT Confirm: 1.1 ratio (ref 0.8–1.2)

## 2023-08-11 LAB — DRVVT MIX: dRVVT Mix: 55.5 s — ABNORMAL HIGH (ref 0.0–40.4)

## 2023-08-12 LAB — PSA, TOTAL AND FREE
PSA, Free Pct: 43.3 %
PSA, Free: 0.13 ng/mL
Prostate Specific Ag, Serum: 0.3 ng/mL (ref 0.0–4.0)

## 2023-08-13 ENCOUNTER — Telehealth: Payer: Self-pay

## 2023-08-13 LAB — HEPATITIS B SURFACE ANTIBODY, QUANTITATIVE: Hep B S AB Quant (Post): <3.5 m[IU]/mL — ABNORMAL LOW

## 2023-08-13 LAB — HEPATITIS B CORE ANTIBODY, TOTAL: HEP B CORE AB: NEGATIVE

## 2023-08-13 NOTE — Transitions of Care (Post Inpatient/ED Visit) (Signed)
   08/13/2023  Name: Reginald Fernandez MRN: [id number] DOB: 1980-12-24  Today's TOC FU Call Status: Today's TOC FU Call Status:: Unsuccessful Call (1st Attempt) Unsuccessful Call (1st Attempt) Date: 08/13/23  Attempted to reach the patient regarding the most recent Inpatient/ED visit.  Follow Up Plan: Additional outreach attempts will be made to reach the patient to complete the Transitions of Care (Post Inpatient/ED visit) call.   Signature Burnett Carson, RN

## 2023-08-13 NOTE — Transitions of Care (Post Inpatient/ED Visit) (Signed)
   08/13/2023  Name: GRAYSIN LUCZYNSKI MRN: [id number] DOB: 1980/07/10  Today's TOC FU Call Status: Today's TOC FU Call Status:: Unsuccessful Call (1st Attempt) Unsuccessful Call (1st Attempt) Date: 08/13/23  Attempted to reach the patient regarding the most recent Inpatient/ED visit.  Follow Up Plan: Additional outreach attempts will be made to reach the patient to complete the Transitions of Care (Post Inpatient/ED visit) call.    Katheryn Pandy MSN, RN RN Case Sales executive Health  [facility] Office Hours M-F [phone] Direct Dial: [phone] Main Phone [phone]  Fax: [phone] [email or web address]

## 2023-08-13 NOTE — Progress Notes (Signed)
ZIO AT applied at hospital   Dr. Shirlee Latch to read.

## 2023-08-14 ENCOUNTER — Telehealth: Payer: Self-pay

## 2023-08-14 NOTE — Transitions of Care (Post Inpatient/ED Visit) (Signed)
   08/14/2023  Name: Reginald Fernandez MRN: [id number] DOB: June 10, 1980  Today's TOC FU Call Status: Today's TOC FU Call Status:: Unsuccessful Call (2nd Attempt) Unsuccessful Call (1st Attempt) Date: 08/13/23 Unsuccessful Call (2nd Attempt) Date: 08/14/23  Attempted to reach the patient regarding the most recent Inpatient/ED visit.  Follow Up Plan: Additional outreach attempts will be made to reach the patient to complete the Transitions of Care (Post Inpatient/ED visit) call.   Signature Burnett Carson, RN

## 2023-08-15 ENCOUNTER — Telehealth: Payer: Self-pay

## 2023-08-15 ENCOUNTER — Telehealth: Payer: Self-pay | Admitting: *Deleted

## 2023-08-15 NOTE — Transitions of Care (Post Inpatient/ED Visit) (Signed)
   08/15/2023  Name: Reginald Fernandez MRN: [id number] DOB: 06/12/1980  Today's TOC FU Call Status: Today's TOC FU Call Status:: Unsuccessful Call (2nd Attempt) Unsuccessful Call (2nd Attempt) Date: 08/15/23  Attempted to reach the patient regarding the most recent Inpatient/ED visit.  Follow Up Plan: Additional outreach attempts will be made to reach the patient to complete the Transitions of Care (Post Inpatient/ED visit) call.   Arna Better RN, BSN [place]  [facility] RN Care Manager [phone]

## 2023-08-15 NOTE — Transitions of Care (Post Inpatient/ED Visit) (Signed)
   08/15/2023  Name: Reginald Fernandez MRN: [id number] DOB: 1980-12-05  Today's TOC FU Call Status: Today's TOC FU Call Status:: Unsuccessful Call (3rd Attempt) Unsuccessful Call (1st Attempt) Date: 08/13/23 Unsuccessful Call (2nd Attempt) Date: 08/14/23 Unsuccessful Call (3rd Attempt) Date: 08/15/23  Attempted to reach the patient regarding the most recent Inpatient/ED visit.  Follow Up Plan: No further outreach attempts will be made at this time. We have been unable to contact the patient.  The patient has a follow up appointment with Dr Lincoln Renshaw at [facility] on 08/23/2023  Signature  Burnett Carson, RN

## 2023-08-16 ENCOUNTER — Telehealth: Payer: Self-pay

## 2023-08-16 ENCOUNTER — Encounter (HOSPITAL_COMMUNITY)

## 2023-08-16 NOTE — Transitions of Care (Post Inpatient/ED Visit) (Signed)
   08/16/2023  Name: Reginald Fernandez MRN: [id number] DOB: 1980-11-17  Today's TOC FU Call Status: Today's TOC FU Call Status:: Unsuccessful Call (3rd Attempt) Unsuccessful Call (3rd Attempt) Date: 08/16/23  Attempted to reach the patient regarding the most recent Inpatient/ED visit. Upon chart review for this outreach attempt it was noted that a PCP appointment is scheduled for 08/23/23.   Follow Up Plan: No further outreach attempts will be made at this time. We have been unable to contact the patient.   Katheryn Pandy MSN, RN RN Case Sales executive Health  [facility]Population [facility] Office Hours M-F [phone] Direct Dial: [phone] Main Phone [phone]  Fax: [phone] [email or web address]

## 2023-08-19 LAB — FACTOR 5 LEIDEN

## 2023-08-20 ENCOUNTER — Encounter (HOSPITAL_COMMUNITY)

## 2023-08-20 ENCOUNTER — Encounter (HOSPITAL_COMMUNITY): Payer: Self-pay | Admitting: Unknown Physician Specialty

## 2023-08-22 ENCOUNTER — Telehealth (INDEPENDENT_AMBULATORY_CARE_PROVIDER_SITE_OTHER): Payer: Self-pay | Admitting: Primary Care

## 2023-08-22 NOTE — Telephone Encounter (Signed)
 Called pt but was unavailable.

## 2023-08-23 ENCOUNTER — Encounter (INDEPENDENT_AMBULATORY_CARE_PROVIDER_SITE_OTHER): Payer: Self-pay | Admitting: Primary Care

## 2023-08-23 ENCOUNTER — Ambulatory Visit (INDEPENDENT_AMBULATORY_CARE_PROVIDER_SITE_OTHER): Admitting: Primary Care

## 2023-08-23 VITALS — BP 110/77 | HR 80 | Resp 16 | Ht 74.0 in | Wt 261.4 lb

## 2023-08-23 DIAGNOSIS — R7989 Other specified abnormal findings of blood chemistry: Secondary | ICD-10-CM | POA: Diagnosis not present

## 2023-08-23 DIAGNOSIS — I2129 ST elevation (STEMI) myocardial infarction involving other sites: Secondary | ICD-10-CM

## 2023-08-23 DIAGNOSIS — Z09 Encounter for follow-up examination after completed treatment for conditions other than malignant neoplasm: Secondary | ICD-10-CM

## 2023-08-23 DIAGNOSIS — I493 Ventricular premature depolarization: Secondary | ICD-10-CM | POA: Diagnosis not present

## 2023-08-23 DIAGNOSIS — J189 Pneumonia, unspecified organism: Secondary | ICD-10-CM

## 2023-08-23 NOTE — Progress Notes (Unsigned)
 Subjective:    Reginald Fernandez is a 43 y.o. obese male presents for hospital follow up and establish care. Patient presented to ED for shortness of breath on exertion. Admit date to the hospital was 08/03/23 dx were community acquired pneumonia and pulmonary embolism. He had a hx of DVT and stopped taking apixaban . Tx with abt's , oxygen as needed  Today, he still has dyspnea on exertion on diuretic an urinating more frequently. Explained medication is working. Bp is well controlled 110/77 endorses taking all his medication. Hospitalization made him realize how he needed to be taking better care of his self. Patient has No headache, No chest pain, No abdominal pain - No Nausea, No new weakness tingling or numbness, No Cough -   Allergies  Allergen Reactions   Penicillins Other (See Comments)    Childhood allergy Unknown reaction  Did it involve swelling of the face/tongue/throat, SOB, or low BP? No Did it involve sudden or severe rash/hives, skin peeling, or any reaction on the inside of your mouth or nose? No Did you need to seek medical attention at a hospital or doctor's office? No When did it last happen?      child If all above answers are NO, may proceed with cephalosporin use.    Current Outpatient Medications on File Prior to Visit  Medication Sig Dispense Refill   acetaminophen  (TYLENOL ) 325 MG tablet Take 2 tablets (650 mg total) by mouth every 6 (six) hours as needed for mild pain (or Fever >/= 101).     amiodarone  (PACERONE ) 200 MG tablet Take 2 tablets (400 mg total) by mouth 2 (two) times daily for 5 days, THEN 2 tablets (400 mg total) daily for 7 days, THEN 1 tablet (200 mg total) daily. 94 tablet 0   apixaban  (ELIQUIS ) 5 MG TABS tablet Take 2 tablets (10 mg total) by mouth 2 (two) times daily for 1 day, THEN 1 tablet (5 mg total) 2 (two) times daily. 182 tablet 0   atorvastatin  (LIPITOR ) 80 MG tablet Take 1 tablet (80 mg total) by mouth daily at 6 PM 90 tablet 1    dapagliflozin  propanediol (FARXIGA ) 10 MG TABS tablet Take 1 tablet (10 mg total) by mouth daily. 90 tablet 3   digoxin  (LANOXIN ) 0.125 MG tablet Take 1 tablet (0.125 mg total) by mouth daily. 90 tablet 3   docusate sodium  (COLACE) 100 MG capsule Take 1 capsule (100 mg total) by mouth every 12 (twelve) hours. (Patient taking differently: Take 100 mg by mouth as needed.) 60 capsule 0   ezetimibe  (ZETIA ) 10 MG tablet Take 1 tablet (10 mg total) by mouth daily. 30 tablet 1   mexiletine (MEXITIL ) 150 MG capsule Take 2 capsules (300 mg total) by mouth 2 (two) times daily. 120 capsule 1   nitroGLYCERIN  (NITROSTAT ) 0.4 MG SL tablet Place 1 tablet (0.4 mg total) under the tongue every 5 (five) minutes as needed for chest pain. 25 tablet 2   sacubitril -valsartan  (ENTRESTO ) 97-103 MG Take 1 tablet by mouth 2 (two) times daily. 60 tablet 2   senna-docusate (SENOKOT-S) 8.6-50 MG tablet Take 1 tablet by mouth 2 (two) times daily.     spironolactone  (ALDACTONE ) 25 MG tablet Take 1 tablet (25 mg total) by mouth daily. 30 tablet 5   torsemide  (DEMADEX ) 20 MG tablet Take 1 tablet (20 mg total) by mouth daily. 30 tablet 2   traMADol  (ULTRAM ) 50 MG tablet Take 1 tablet (50 mg total) by mouth every 6 (six)  hours as needed. 15 tablet 0   colchicine  0.6 MG tablet Take 1 tablet (0.6 mg total) by mouth daily for 7 days. 7 tablet 0   pantoprazole  (PROTONIX ) 40 MG tablet Take 1 tablet (40 mg total) by mouth daily. (Patient not taking: Reported on 08/23/2023) 30 tablet 1   No current facility-administered medications on file prior to visit.    Review of System: ROS Comprehensive ROS Pertinent positive and negative noted in HPI   Objective:  BP 110/77   Pulse 80   Resp 16   Ht 6' 2 (1.88 m)   Wt 261 lb 6.4 oz (118.6 kg)   SpO2 99%   BMI 33.56 kg/m   Filed Weights   08/23/23 1018  Weight: 261 lb 6.4 oz (118.6 kg)   :Physical exam: General: Vital signs reviewed.  Patient is well-developed and well-nourished,  obese male  in no acute distress and cooperative with exam. Head: Normocephalic and atraumatic. Eyes: EOMI, conjunctivae normal, no scleral icterus. Neck: Supple,thick , trachea midline, normal ROM, no JVD, masses, thyromegaly, or carotid bruit present. Cardiovascular: RRR, S1 normal, S2 normal, no murmurs, gallops, or rubs. Pulmonary/Chest: Clear to auscultation bilaterally, no wheezes, rales, or rhonchi. Abdominal: Soft, non-tender, non-distended, BS +, no masses, organomegaly, or guarding present. Musculoskeletal: No joint deformities, erythema, or stiffness, ROM full and nontender. Extremities: No lower extremity edema bilaterally,  pulses symmetric and intact bilaterally. No cyanosis or clubbing. Neurological: A&O x3, Strength is normal Skin: Warm, dry and intact. No rashes or erythema. Psychiatric: Normal mood and affect. speech and behavior is normal. Cognition and memory are normal.    Assessment:  Reginald Fernandez was seen today for hospitalization follow-up.  Diagnoses and all orders for this visit:  ST elevation myocardial infarction (STEMI) of true posterior wall, subsequent episode of care (HCC) 2/2 PVC (premature ventricular contraction) Reviewed medications with cardiologist continue current regiment and schedule appt referral with cardiology -     Ambulatory referral to Cardiology  Elevated TSH Elevated in hospital repeat to reck levels tx if indicated  -     TSH + free T4 -     T3, free  Hospital discharge follow-up 2/2 Pneumonia of right lower lobe due to infectious organism  Completed course of tx abt's   This note has been created with Education officer, environmental. Any transcriptional errors are unintentional.   Return in about 3 months (around 11/23/2023).  Marius Siemens, NP 08/28/2023, 11:45 PM

## 2023-08-24 LAB — TSH+FREE T4
Free T4: 1.29 ng/dL (ref 0.82–1.77)
TSH: 2.14 u[IU]/mL (ref 0.450–4.500)

## 2023-08-24 LAB — T3, FREE: T3, Free: 3.2 pg/mL (ref 2.0–4.4)

## 2023-08-25 ENCOUNTER — Ambulatory Visit (INDEPENDENT_AMBULATORY_CARE_PROVIDER_SITE_OTHER): Payer: Self-pay | Admitting: Primary Care

## 2023-08-27 ENCOUNTER — Telehealth (HOSPITAL_COMMUNITY): Payer: Self-pay | Admitting: Vascular Surgery

## 2023-08-27 NOTE — Telephone Encounter (Signed)
Lvm to make f/u appt  

## 2023-08-28 ENCOUNTER — Encounter (INDEPENDENT_AMBULATORY_CARE_PROVIDER_SITE_OTHER): Payer: Self-pay | Admitting: Primary Care

## 2023-08-29 NOTE — Telephone Encounter (Signed)
 LMOM

## 2023-09-23 ENCOUNTER — Encounter (HOSPITAL_COMMUNITY): Admitting: Cardiology

## 2023-10-03 ENCOUNTER — Telehealth (INDEPENDENT_AMBULATORY_CARE_PROVIDER_SITE_OTHER): Payer: Self-pay | Admitting: Primary Care

## 2023-10-03 NOTE — Telephone Encounter (Signed)
 Called pt to confirm appt 7/18 LVM

## 2023-10-04 ENCOUNTER — Ambulatory Visit (INDEPENDENT_AMBULATORY_CARE_PROVIDER_SITE_OTHER): Admitting: Primary Care

## 2024-03-04 ENCOUNTER — Emergency Department (HOSPITAL_BASED_OUTPATIENT_CLINIC_OR_DEPARTMENT_OTHER)

## 2024-03-04 ENCOUNTER — Other Ambulatory Visit: Payer: Self-pay

## 2024-03-04 ENCOUNTER — Emergency Department (HOSPITAL_BASED_OUTPATIENT_CLINIC_OR_DEPARTMENT_OTHER): Admitting: Radiology

## 2024-03-04 ENCOUNTER — Inpatient Hospital Stay (HOSPITAL_BASED_OUTPATIENT_CLINIC_OR_DEPARTMENT_OTHER)
Admission: EM | Admit: 2024-03-04 | Discharge: 2024-03-19 | DRG: 286 | Disposition: A | Discharge status: Home Health | Attending: Internal Medicine | Admitting: Internal Medicine

## 2024-03-04 ENCOUNTER — Encounter (HOSPITAL_BASED_OUTPATIENT_CLINIC_OR_DEPARTMENT_OTHER): Payer: Self-pay

## 2024-03-04 DIAGNOSIS — R7303 Prediabetes: Secondary | ICD-10-CM

## 2024-03-04 DIAGNOSIS — Z6835 Body mass index (BMI) 35.0-35.9, adult: Secondary | ICD-10-CM

## 2024-03-04 DIAGNOSIS — Z83438 Family history of other disorder of lipoprotein metabolism and other lipidemia: Secondary | ICD-10-CM

## 2024-03-04 DIAGNOSIS — E871 Hypo-osmolality and hyponatremia: Secondary | ICD-10-CM | POA: Diagnosis present

## 2024-03-04 DIAGNOSIS — Z880 Allergy status to penicillin: Secondary | ICD-10-CM

## 2024-03-04 DIAGNOSIS — E860 Dehydration: Secondary | ICD-10-CM | POA: Diagnosis present

## 2024-03-04 DIAGNOSIS — Z955 Presence of coronary angioplasty implant and graft: Secondary | ICD-10-CM

## 2024-03-04 DIAGNOSIS — Z8249 Family history of ischemic heart disease and other diseases of the circulatory system: Secondary | ICD-10-CM

## 2024-03-04 DIAGNOSIS — I2699 Other pulmonary embolism without acute cor pulmonale: Secondary | ICD-10-CM | POA: Diagnosis present

## 2024-03-04 DIAGNOSIS — R1084 Generalized abdominal pain: Secondary | ICD-10-CM

## 2024-03-04 DIAGNOSIS — I5023 Acute on chronic systolic (congestive) heart failure: Principal | ICD-10-CM | POA: Diagnosis present

## 2024-03-04 DIAGNOSIS — Z1152 Encounter for screening for COVID-19: Secondary | ICD-10-CM

## 2024-03-04 DIAGNOSIS — E785 Hyperlipidemia, unspecified: Secondary | ICD-10-CM | POA: Diagnosis present

## 2024-03-04 DIAGNOSIS — I472 Ventricular tachycardia, unspecified: Secondary | ICD-10-CM | POA: Diagnosis present

## 2024-03-04 DIAGNOSIS — Z86711 Personal history of pulmonary embolism: Secondary | ICD-10-CM

## 2024-03-04 DIAGNOSIS — I2510 Atherosclerotic heart disease of native coronary artery without angina pectoris: Secondary | ICD-10-CM | POA: Diagnosis present

## 2024-03-04 DIAGNOSIS — Z79899 Other long term (current) drug therapy: Secondary | ICD-10-CM

## 2024-03-04 DIAGNOSIS — I420 Dilated cardiomyopathy: Secondary | ICD-10-CM | POA: Diagnosis present

## 2024-03-04 DIAGNOSIS — I5082 Biventricular heart failure: Secondary | ICD-10-CM | POA: Diagnosis present

## 2024-03-04 DIAGNOSIS — K761 Chronic passive congestion of liver: Secondary | ICD-10-CM | POA: Diagnosis present

## 2024-03-04 DIAGNOSIS — I252 Old myocardial infarction: Secondary | ICD-10-CM

## 2024-03-04 DIAGNOSIS — F17290 Nicotine dependence, other tobacco product, uncomplicated: Secondary | ICD-10-CM | POA: Diagnosis present

## 2024-03-04 DIAGNOSIS — Z7901 Long term (current) use of anticoagulants: Secondary | ICD-10-CM

## 2024-03-04 DIAGNOSIS — I5084 End stage heart failure: Secondary | ICD-10-CM | POA: Diagnosis present

## 2024-03-04 DIAGNOSIS — I493 Ventricular premature depolarization: Secondary | ICD-10-CM | POA: Diagnosis present

## 2024-03-04 DIAGNOSIS — E66812 Obesity, class 2: Secondary | ICD-10-CM | POA: Diagnosis present

## 2024-03-04 DIAGNOSIS — I10 Essential (primary) hypertension: Secondary | ICD-10-CM | POA: Diagnosis present

## 2024-03-04 DIAGNOSIS — I5022 Chronic systolic (congestive) heart failure: Secondary | ICD-10-CM

## 2024-03-04 DIAGNOSIS — Z86718 Personal history of other venous thrombosis and embolism: Secondary | ICD-10-CM

## 2024-03-04 DIAGNOSIS — R7989 Other specified abnormal findings of blood chemistry: Secondary | ICD-10-CM | POA: Diagnosis present

## 2024-03-04 DIAGNOSIS — I255 Ischemic cardiomyopathy: Secondary | ICD-10-CM | POA: Diagnosis present

## 2024-03-04 DIAGNOSIS — K5900 Constipation, unspecified: Secondary | ICD-10-CM

## 2024-03-04 DIAGNOSIS — I110 Hypertensive heart disease with heart failure: Principal | ICD-10-CM | POA: Diagnosis present

## 2024-03-04 DIAGNOSIS — N179 Acute kidney failure, unspecified: Secondary | ICD-10-CM | POA: Diagnosis present

## 2024-03-04 DIAGNOSIS — Z91148 Patient's other noncompliance with medication regimen for other reason: Secondary | ICD-10-CM

## 2024-03-04 LAB — COMPREHENSIVE METABOLIC PANEL WITH GFR
ALT: 21 U/L (ref 0–44)
AST: 24 U/L (ref 15–41)
Albumin: 4.7 g/dL (ref 3.5–5.0)
Alkaline Phosphatase: 82 U/L (ref 38–126)
Anion gap: 11 (ref 5–15)
BUN: 13 mg/dL (ref 6–20)
CO2: 29 mmol/L (ref 22–32)
Calcium: 9.9 mg/dL (ref 8.9–10.3)
Chloride: 101 mmol/L (ref 98–111)
Creatinine, Ser: 1.2 mg/dL (ref 0.61–1.24)
GFR, Estimated: >60 mL/min (ref >60)
Glucose, Bld: 111 mg/dL — ABNORMAL HIGH (ref 70–99)
Potassium: 4.6 mmol/L (ref 3.5–5.1)
Sodium: 141 mmol/L (ref 135–145)
Total Bilirubin: 4.1 mg/dL — ABNORMAL HIGH (ref 0.0–1.2)
Total Protein: 8.3 g/dL — ABNORMAL HIGH (ref 6.5–8.1)

## 2024-03-04 LAB — URINALYSIS, ROUTINE W REFLEX MICROSCOPIC
Bilirubin Urine: NEGATIVE
Glucose, UA: NEGATIVE mg/dL
Ketones, ur: NEGATIVE mg/dL
Nitrite: NEGATIVE
Protein, ur: 100 mg/dL — AB
Specific Gravity, Urine: 1.03 (ref 1.005–1.030)
pH: 6 (ref 5.0–8.0)

## 2024-03-04 LAB — PRO BRAIN NATRIURETIC PEPTIDE: Pro Brain Natriuretic Peptide: 3698 pg/mL — ABNORMAL HIGH (ref <300.0)

## 2024-03-04 LAB — RESP PANEL BY RT-PCR (RSV, FLU A&B, COVID)  RVPGX2
Influenza A by PCR: NEGATIVE
Influenza B by PCR: NEGATIVE
Resp Syncytial Virus by PCR: NEGATIVE
SARS Coronavirus 2 by RT PCR: NEGATIVE

## 2024-03-04 LAB — CBC
HCT: 45.5 % (ref 39.0–52.0)
Hemoglobin: 15.4 g/dL (ref 13.0–17.0)
MCH: 31.2 pg (ref 26.0–34.0)
MCHC: 33.8 g/dL (ref 30.0–36.0)
MCV: 92.1 fL (ref 80.0–100.0)
Platelets: 271 K/uL (ref 150–400)
RBC: 4.94 MIL/uL (ref 4.22–5.81)
RDW: 13.8 % (ref 11.5–15.5)
WBC: 10.6 K/uL — ABNORMAL HIGH (ref 4.0–10.5)
nRBC: 0 % (ref 0.0–0.2)

## 2024-03-04 LAB — TROPONIN T, HIGH SENSITIVITY
Troponin T High Sensitivity: 47 ng/L — ABNORMAL HIGH (ref 0–19)
Troponin T High Sensitivity: 46 ng/L — ABNORMAL HIGH (ref 0–19)

## 2024-03-04 LAB — LIPASE, BLOOD: Lipase: 23 U/L (ref 11–51)

## 2024-03-04 MED ORDER — ONDANSETRON HCL 4 MG/2ML IJ SOLN
4.0000 mg | Freq: Once | INTRAMUSCULAR | Status: AC
  Administered 2024-03-04: 20:00:00 4 mg via INTRAVENOUS
  Filled 2024-03-04: qty 2

## 2024-03-04 MED ORDER — FUROSEMIDE 10 MG/ML IJ SOLN
60.0000 mg | Freq: Once | INTRAMUSCULAR | Status: AC
  Administered 2024-03-04: 21:00:00 60 mg via INTRAVENOUS
  Filled 2024-03-04: qty 6

## 2024-03-04 MED ORDER — MORPHINE SULFATE (PF) 4 MG/ML IV SOLN
4.0000 mg | Freq: Once | INTRAVENOUS | Status: AC
  Administered 2024-03-04: 20:00:00 4 mg via INTRAVENOUS
  Filled 2024-03-04: qty 1

## 2024-03-04 MED ORDER — IOHEXOL 350 MG/ML SOLN
100.0000 mL | Freq: Once | INTRAVENOUS | Status: AC | PRN
  Administered 2024-03-04: 21:00:00 100 mL via INTRAVENOUS

## 2024-03-04 NOTE — ED Provider Notes (Signed)
°  Physical Exam  BP (!) 151/91   Pulse (!) 48   Temp 98 F (36.7 C)   Resp 19   Wt 126.1 kg   SpO2 93%   BMI 35.68 kg/m   Physical Exam Vitals and nursing note reviewed.  Constitutional:      Appearance: Normal appearance.  HENT:     Head: Normocephalic and atraumatic.  Eyes:     General:        Right eye: No discharge.        Left eye: No discharge.     Conjunctiva/sclera: Conjunctivae normal.  Pulmonary:     Effort: Pulmonary effort is normal.  Skin:    General: Skin is warm and dry.     Findings: No rash.  Neurological:     General: No focal deficit present.     Mental Status: He is alert.  Psychiatric:        Mood and Affect: Mood normal.        Behavior: Behavior normal.     Procedures  Procedures  ED Course / MDM    Medical Decision Making Amount and/or Complexity of Data Reviewed Labs: ordered. Radiology: ordered.  Risk Prescription drug management. Decision regarding hospitalization.   Accepted handoff at shift change from Barrett PA-C. Please see prior provider note for more detail.   Briefly: Patient is 43 y.o. male patient who presents to the emergency department today for further evaluation of shortness of breath and abdominal distention.  DDX: concern for CHF, PE, acute abdomen  Plan: Admit to hospitalist service for acute exacerbation of CHF.  Patient has had urine output here.  Stable for admission.  Not currently having a chest pain.            Theotis Peers [place], PA-C 03/04/24 7676    Charlyn Sora, MD 03/05/24 [phone]

## 2024-03-04 NOTE — Progress Notes (Signed)
 Hospitalist Transfer Note:    Nursing staff, Please call [facility] Admits & Consults System-Wide number on Amion [phone]) as soon as patient's arrival, so appropriate admitting provider can evaluate the pt.   Transferring facility: [facility] Requesting provider: Cameron Servant, PA (EDP at [facility]) Reason for transfer: admission for further evaluation and management of acute on chronic systolic heart failure.    43 year old male with history of chronic systolic heart failure, who presented to [facility] ED complaining of 3 to 4 days of progressive shortness of breath associated with recent increase in abdominal fullness.  Denies any significant recent increase in edema in the lower extremities, but notes a 20 pound weight gain since being admitted to the hospital in June 2025 for acutely decompensated heart failure.  No recent chest pain.   He notes suboptimal compliance with his outpatient heart failure regimen, noting that he will miss multiple doses of these medications, on average, each week.   Most recent echocardiogram occurred in May 2025, and was notable for LVEF less than 20% along with reduced right ventricular systolic function.  Vital signs in the ED were notable for the following: Systolic pressures in the 130s to 150s.  Oxygen saturation 95 to 100% on room air.  Labs were notable for initial high sensitive troponin value 47, with repeat trending down to 46.  Prior BNP 3700.  CBC notable for mild elevation white blood cell count of 10,600.  Imaging notable for CTA chest showed no evidence of acute pulmonary embolism.  Medications administered prior to transfer included the following: Lasix  60 mg IV x 1 dose.  Subsequently, I accepted this patient for transfer for observation admission to a pcu bed at [facility] for further work-up and management of the above.       Eva Pore, DO Hospitalist

## 2024-03-04 NOTE — ED Notes (Signed)
 Patient transported to X-ray

## 2024-03-04 NOTE — ED Notes (Signed)
 Ambulated patient around unit with pulse oximetry. Lowest SpO2 94%. Max HR 130. Mild increase in WOB noted.  No dizziness or CP, per patient. Pt c/o heaviness in belly. EDP notified.

## 2024-03-04 NOTE — ED Triage Notes (Signed)
 Patient reports shortness of breath since Friday. He also says he feels dehydrated from his fluid pills. He feels some chest congestion and tightness saying he feels it dripping down into his stomach causing discomfort.

## 2024-03-04 NOTE — ED Provider Notes (Signed)
 [place] EMERGENCY DEPARTMENT AT [facility] Provider Note   CSN: [id number] Arrival date & time: 03/04/24  1825     Patient presents with: Chest Pain, Shortness of Breath, Dehydration, and Abdominal Pain   EFE FAZZINO is a 43 y.o. male patient with past medical history of STEMI, coronary artery disease, hypertension, hyperlipidemia, congestive heart failure, pulmonary embolism on Eliquis  presents to emergency room with complaint of generalized abdominal pain, constipation, abdominal distention, chest pain and shortness of breath.  Patient reports that for 5 days ago he started having this abdominal discomfort he tried Dulcolax which gave him diarrhea but did not really help with abdominal pain or abdominal distention.  He also reports that he does not feel that his Lasix  is working and his shortness of breath is getting significantly worse.  He reports that when he stands up and walks very short distances he has to stop to rest.  He denies any current chest pain or fever or cough.  He reports he is approximately taking medicine 80% of the time so he has missed some doses of his Eliquis .    Chest Pain Associated symptoms: abdominal pain and shortness of breath   Shortness of Breath Associated symptoms: abdominal pain and chest pain   Abdominal Pain Associated symptoms: chest pain and shortness of breath        Prior to Admission medications  Medication Sig Start Date End Date Taking? Authorizing Provider  acetaminophen  (TYLENOL ) 325 MG tablet Take 2 tablets (650 mg total) by mouth every 6 (six) hours as needed for mild pain (or Fever >/= 101). 06/07/22   Gonfa, Taye T, MD  amiodarone  (PACERONE ) 200 MG tablet Take 2 tablets (400 mg total) by mouth 2 (two) times daily for 5 days, THEN 2 tablets (400 mg total) daily for 7 days, THEN 1 tablet (200 mg total) daily. 08/10/23 10/21/23  Briana Elgin LABOR, MD  apixaban  (ELIQUIS ) 5 MG TABS tablet Take 2 tablets (10 mg total) by mouth 2  (two) times daily for 1 day, THEN 1 tablet (5 mg total) 2 (two) times daily. 08/10/23 11/09/23  Briana Elgin LABOR, MD  atorvastatin  (LIPITOR ) 80 MG tablet Take 1 tablet (80 mg total) by mouth daily at 6 PM 03/18/23   Milford, Harlene HERO, FNP  colchicine  0.6 MG tablet Take 1 tablet (0.6 mg total) by mouth daily for 7 days. 08/11/23 08/18/23  Briana Elgin LABOR, MD  dapagliflozin  propanediol (FARXIGA ) 10 MG TABS tablet Take 1 tablet (10 mg total) by mouth daily. 06/27/22   Danton Jon HERO, PA-C  digoxin  (LANOXIN ) 0.125 MG tablet Take 1 tablet (0.125 mg total) by mouth daily. 07/20/22   Clegg, Amy D, NP  docusate sodium  (COLACE) 100 MG capsule Take 1 capsule (100 mg total) by mouth every 12 (twelve) hours. Patient taking differently: Take 100 mg by mouth as needed. 07/11/22   Lynwood Lenis, PA-C  ezetimibe  (ZETIA ) 10 MG tablet Take 1 tablet (10 mg total) by mouth daily. 03/18/23   Milford, Harlene HERO, FNP  mexiletine (MEXITIL ) 150 MG capsule Take 2 capsules (300 mg total) by mouth 2 (two) times daily. 08/10/23   Briana Elgin LABOR, MD  nitroGLYCERIN  (NITROSTAT ) 0.4 MG SL tablet Place 1 tablet (0.4 mg total) under the tongue every 5 (five) minutes as needed for chest pain. 10/12/21   Marcine Catalan M, PA-C  pantoprazole  (PROTONIX ) 40 MG tablet Take 1 tablet (40 mg total) by mouth daily. Patient not taking: Reported on 08/23/2023 06/08/22  Gonfa, Taye T, MD  senna-docusate (SENOKOT-S) 8.6-50 MG tablet Take 1 tablet by mouth 2 (two) times daily. 06/07/22   Gonfa, Taye T, MD  spironolactone  (ALDACTONE ) 25 MG tablet Take 1 tablet (25 mg total) by mouth daily. 10/17/21   Milford, Harlene HERO, FNP  torsemide  (DEMADEX ) 20 MG tablet Take 1 tablet (20 mg total) by mouth daily. 08/11/23 11/09/23  Briana Elgin LABOR, MD  traMADol  (ULTRAM ) 50 MG tablet Take 1 tablet (50 mg total) by mouth every 6 (six) hours as needed. 07/11/22   Lynwood Lenis, PA-C    Allergies: Penicillins    Review of Systems  Respiratory:  Positive for shortness of  breath.   Cardiovascular:  Positive for chest pain.  Gastrointestinal:  Positive for abdominal pain.    Updated Vital Signs BP 131/72   Pulse (!) 45   Temp 98 F (36.7 C)   Resp 19   Wt 126.1 kg   SpO2 92%   BMI 35.68 kg/m   Physical Exam Vitals and nursing note reviewed.  Constitutional:      General: He is not in acute distress.    Appearance: He is not toxic-appearing.  HENT:     Head: Normocephalic and atraumatic.     Mouth/Throat:     Comments: Patient has mild scleral icterus on exam. Eyes:     General: Scleral icterus present.     Conjunctiva/sclera: Conjunctivae normal.  Cardiovascular:     Rate and Rhythm: Regular rhythm. Tachycardia present.     Pulses: Normal pulses.     Heart sounds: Normal heart sounds.  Pulmonary:     Effort: Pulmonary effort is normal. No respiratory distress.     Breath sounds: Normal breath sounds.     Comments: Mildly tachypneic. Chest:     Chest wall: No tenderness.  Abdominal:     General: Abdomen is flat. Bowel sounds are normal. There is distension.     Palpations: Abdomen is soft.     Tenderness: There is no abdominal tenderness.  Musculoskeletal:     Right lower leg: No edema.     Left lower leg: No edema.  Skin:    General: Skin is warm and dry.     Findings: No lesion.  Neurological:     General: No focal deficit present.     Mental Status: He is alert and oriented to person, place, and time. Mental status is at baseline.     (all labs ordered are listed, but only abnormal results are displayed) Labs Reviewed  CBC - Abnormal; Notable for the following components:      Result Value   WBC 10.6 (*)    All other components within normal limits  COMPREHENSIVE METABOLIC PANEL WITH GFR - Abnormal; Notable for the following components:   Glucose, Bld 111 (*)    Total Protein 8.3 (*)    Total Bilirubin 4.1 (*)    All other components within normal limits  PRO BRAIN NATRIURETIC PEPTIDE - Abnormal; Notable for the  following components:   Pro Brain Natriuretic Peptide 3,698.0 (*)    All other components within normal limits  TROPONIN T, HIGH SENSITIVITY - Abnormal; Notable for the following components:   Troponin T High Sensitivity 47 (*)    All other components within normal limits  RESP PANEL BY RT-PCR (RSV, FLU A&B, COVID)  RVPGX2  LIPASE, BLOOD  URINALYSIS, ROUTINE W REFLEX MICROSCOPIC  TROPONIN T, HIGH SENSITIVITY    EKG: None  Radiology: CT Angio Chest PE  W and/or Wo Contrast Result Date: 03/04/2024 EXAM: CTA CHEST 03/04/2024 09:05:28 PM TECHNIQUE: CTA of the chest was performed without and with the administration of 100 mL of iohexol  (OMNIPAQUE ) 350 MG/ML injection. Multiplanar reformatted images are provided for review. MIP images are provided for review. Automated exposure control, iterative reconstruction, and/or weight based adjustment of the mA/kV was utilized to reduce the radiation dose to as low as reasonably achievable. COMPARISON: CT chest 5:19:25 cxr 03/04/24 CLINICAL HISTORY: Pulmonary embolism (PE) suspected, high prob. Patient reports shortness of breath since Friday. He also says he feels dehydrated from his fluid pills. He feels some chest congestion and tightness FINDINGS: PULMONARY ARTERIES: Pulmonary arteries are adequately opacified for evaluation. No acute pulmonary embolus is identified at the evaluable levels. Limited evaluation at the subsegmental level due to timing of contrast and motion artifact. Main pulmonary artery is normal in caliber. MEDIASTINUM: Cardiomegaly. At least 3-vessel coronary artery calcification. Incidentally noted aberrant right brachiocephalic artery. There is no acute abnormality of the thoracic aorta. LYMPH NODES: No mediastinal, hilar or axillary lymphadenopathy. LUNGS AND PLEURA: Diffuse mild bronchial wall thickening. Right middle lobe and linear atelectasis versus scarring. Interval resolution of right middle lobe consolidation. Expiratory phase of  respiration with slight mosaic attenuation of the lower lung zones. Lingular atelectasis. No pulmonary edema. No evidence of pleural effusion or pneumothorax. UPPER ABDOMEN: Right posterior hemidiaphragmatic fat-containing hernia. SOFT TISSUES AND BONES: Trace bilateral gynecomastia. No acute bone abnormality. IMPRESSION: 1. No pulmonary embolism identified, although evaluation is limited at the subsegmental level due to timing of contrast and motion artifact. 2. Cardiomegaly with at least 3-vessel coronary artery calcification. 3. Other, non-acute and/or normal findings as above. Electronically signed by: Morgane Naveau MD 03/04/2024 09:44 PM EST RP Workstation: [id number]   CT ABDOMEN PELVIS W CONTRAST Result Date: 03/04/2024 EXAM: CT ABDOMEN AND PELVIS WITH CONTRAST 03/04/2024 09:05:28 PM TECHNIQUE: CT of the abdomen and pelvis was performed with the administration of intravenous contrast (100 mL iohexol  (OMNIPAQUE ) 350 MG/ML injection). Multiplanar reformatted images are provided for review. Automated exposure control, iterative reconstruction, and/or weight-based adjustment of the mA/kV was utilized to reduce the radiation dose to as low as reasonably achievable. COMPARISON: CT angiography of the chest 03/04/2024. CT abdomen and pelvis 03:19:24 CLINICAL HISTORY: Abdominal pain, acute, nonlocalized. FINDINGS: LOWER CHEST: Please see separately dictated CT angiography of the chest 12:17:2025. LIVER: The liver is unremarkable. GALLBLADDER AND BILE DUCTS: Gallbladder is unremarkable. No biliary ductal dilatation. SPLEEN: No acute abnormality. PANCREAS: No acute abnormality. ADRENAL GLANDS: No acute abnormality. KIDNEYS, URETERS AND BLADDER: Bilateral renal cortical scarring. No stones in the kidneys or ureters. No hydronephrosis. No perinephric or periureteral stranding. Urinary bladder is unremarkable. GI AND BOWEL: Stomach demonstrates no acute abnormality. No small or large bowel thickening or dilatation.  The appendix is unremarkable. Colonic diverticulosis. There is no bowel obstruction. PERITONEUM AND RETROPERITONEUM: No ascites. No free air. No abdominal or inguinal hernia. VASCULATURE: Aorta is normal in caliber. Moderate atherosclerotic plaque. LYMPH NODES: No lymphadenopathy. REPRODUCTIVE ORGANS: The prostate is unremarkable. BONES AND SOFT TISSUES: Small retrolisthesis of L5 on S1. Intervertebral disc space vacuum phenomenon at the L5-S1 level. No acute osseous abnormality. No focal soft tissue abnormality. IMPRESSION: 1. No acute findings in the abdomen or pelvis. 2. Please see separately dictated CT angiography of the chest 12:17:2025. Electronically signed by: Morgane Naveau MD 03/04/2024 09:15 PM EST RP Workstation: [id number]   DG Chest 2 View Result Date: 03/04/2024 EXAM: 2 VIEW(S) XRAY OF THE CHEST 03/04/2024 06:44:00  PM COMPARISON: 08/03/2023 CLINICAL HISTORY: chest pain, shortness of breath FINDINGS: LUNGS AND PLEURA: Trace superimposed pulmonary edema. No pleural effusion. No pneumothorax. HEART AND MEDIASTINUM: Stable cardiomegaly. Stable hilar enlargement likely reflecting pulmonary arterial enlargement in the setting of Pulmonary Arterial Hypertension. BONES AND SOFT TISSUES: No acute osseous abnormality. IMPRESSION: 1. Trace superimposed pulmonary edema, new from prior examination . 2. Stable cardiomegaly and hilar enlargement, likely reflecting pulmonary arterial enlargement in the setting of pulmonary arterial hypertension. Electronically signed by: Dorethia Molt MD 03/04/2024 06:48 PM EST RP Workstation: [id number]     Procedures   Medications Ordered in the ED  morphine  (PF) 4 MG/ML injection 4 mg (4 mg Intravenous Given 03/04/24 1956)  ondansetron  (ZOFRAN ) injection 4 mg (4 mg Intravenous Given 03/04/24 1956)  furosemide  (LASIX ) injection 60 mg (60 mg Intravenous Given 03/04/24 2119)  iohexol  (OMNIPAQUE ) 350 MG/ML injection 100 mL (100 mLs Intravenous Contrast Given 03/04/24  2050)                                    Medical Decision Making Amount and/or Complexity of Data Reviewed Labs: ordered. Radiology: ordered.  Risk Prescription drug management.   This patient presents to the ED for concern of shortness of breath, this involves an extensive number of treatment options, and is a complaint that carries with it a high risk of complications and morbidity.  The differential diagnosis includes CHF, PE, pneumonia, pneumothorax, pulmonary embolus, asthma, COPD   Co morbidities that complicate the patient evaluation  STARSKY NANNA is a 43 y.o. male patient with past medical history of STEMI, coronary artery disease, hypertension, hyperlipidemia, congestive heart failure, pulmonary embolism on Eliquis    Additional history obtained:  Additional history obtained from patient's last echo was 08/04/2023 and had estimated EF of less than 20%, patient had right heart cath on 08/09/2023 which showed acute on chronic systolic heart failure with severely reduced cardiac output   Lab Tests:  I personally interpreted labs.  The pertinent results include:   CBC shows mild leukocytosis and no anemia.  CMP is remarkable for total bilirubin of 4.1   Imaging Studies ordered:  I ordered imaging studies including CT PE study, CT scan of abdomen pelvis, chest x-ray I independently visualized and interpreted imaging which showed no acute findings in abdomen and pelvis, x-ray shows cardiomegaly with pulmonary edema, CT angio with no pulmonary embolism. I agree with the radiologist interpretation   Cardiac Monitoring: / EKG:  The patient was maintained on a cardiac monitor.  I personally viewed and interpreted the cardiac monitored which showed an underlying rhythm of: [place] with a rate of 110   Problem List / ED Course / Critical interventions / Medication management  Patient presents to emergency room with complaint of abdominal distention, chest pain shortness  of breath.  On arrival he is tachycardic but not hypoxic.  Mildly tachypneic but no wheezing or decreased breath sounds on exam.  His lab work is remarkable for troponin of 47, delta still pending.  He has elevated proBNP at 3600.  Lab work is overall reassuring.  Given that he is chest congestion, history of heart failure and shortness of breath this is concerning for CHF exacerbation.  He was given dose of IV Lasix  here.  He does have approximately 20 pound weight gain since his last discharge from the hospital.  I did rule out PE and pneumonia on CT angio.  No  other cute findings on CT scan of abdomen and pelvis. I ordered medication including patient was provided with morphine , Zofran  and 60 of IV Lasix  Reevaluation of the patient after these medicines showed that the patient stayed the same I have reviewed the patients home medicines and have made adjustments as needed. Patient was signed out to oncoming PA at shift change.       Final diagnoses:  Acute on chronic systolic congestive heart failure [facility])  Generalized abdominal pain    ED Discharge Orders     None          Shermon Warren SAILOR, PA-C 03/04/24 2201    Charlyn Sora, MD 03/05/24 1555

## 2024-03-05 ENCOUNTER — Observation Stay (HOSPITAL_COMMUNITY)

## 2024-03-05 DIAGNOSIS — K5900 Constipation, unspecified: Secondary | ICD-10-CM | POA: Diagnosis not present

## 2024-03-05 DIAGNOSIS — I420 Dilated cardiomyopathy: Secondary | ICD-10-CM | POA: Diagnosis not present

## 2024-03-05 DIAGNOSIS — R0609 Other forms of dyspnea: Secondary | ICD-10-CM

## 2024-03-05 DIAGNOSIS — I5023 Acute on chronic systolic (congestive) heart failure: Secondary | ICD-10-CM | POA: Diagnosis not present

## 2024-03-05 LAB — ECHOCARDIOGRAM COMPLETE
Est EF: <20
S' Lateral: 5.9 cm
Weight: 4446.24 [oz_av]

## 2024-03-05 LAB — DIGOXIN LEVEL: Digoxin Level: <0.6 ng/mL — ABNORMAL LOW (ref 0.8–2.0)

## 2024-03-05 MED ORDER — FUROSEMIDE 10 MG/ML IJ SOLN: 40.0000 mg | Freq: Two times a day (BID) | INTRAMUSCULAR | Status: DC

## 2024-03-05 MED ORDER — ATORVASTATIN CALCIUM 80 MG PO TABS
80.0000 mg | ORAL_TABLET | Freq: Every day | ORAL | Status: DC
  Administered 2024-03-05 – 2024-03-18 (×14): 80 mg via ORAL
  Filled 2024-03-05 (×11): qty 1

## 2024-03-05 MED ORDER — MEXILETINE HCL 150 MG PO CAPS
300.0000 mg | ORAL_CAPSULE | Freq: Two times a day (BID) | ORAL | Status: DC
  Administered 2024-03-05 – 2024-03-19 (×28): 300 mg via ORAL
  Filled 2024-03-05 (×21): qty 2

## 2024-03-05 MED ORDER — AMIODARONE HCL 200 MG PO TABS
200.0000 mg | ORAL_TABLET | Freq: Every day | ORAL | Status: DC
  Administered 2024-03-05 – 2024-03-06 (×2): 200 mg via ORAL
  Filled 2024-03-05 (×2): qty 1

## 2024-03-05 MED ORDER — DAPAGLIFLOZIN PROPANEDIOL 10 MG PO TABS
10.0000 mg | ORAL_TABLET | Freq: Every day | ORAL | Status: DC
  Administered 2024-03-06 – 2024-03-19 (×14): 10 mg via ORAL
  Filled 2024-03-05 (×11): qty 1

## 2024-03-05 MED ORDER — EZETIMIBE 10 MG PO TABS
10.0000 mg | ORAL_TABLET | Freq: Every day | ORAL | Status: DC
  Administered 2024-03-06 – 2024-03-19 (×14): 10 mg via ORAL
  Filled 2024-03-05 (×10): qty 1

## 2024-03-05 MED ORDER — POLYETHYLENE GLYCOL 3350 17 G PO PACK: 17.0000 g | PACK | Freq: Two times a day (BID) | ORAL | Status: DC | PRN

## 2024-03-05 MED ORDER — ACETAMINOPHEN 650 MG RE SUPP: 650.0000 mg | Freq: Four times a day (QID) | RECTAL | Status: DC | PRN

## 2024-03-05 MED ORDER — PERFLUTREN LIPID MICROSPHERE
1.0000 mL | INTRAVENOUS | Status: AC | PRN
  Administered 2024-03-05: 18:00:00 3 mL via INTRAVENOUS

## 2024-03-05 MED ORDER — SENNOSIDES-DOCUSATE SODIUM 8.6-50 MG PO TABS
1.0000 | ORAL_TABLET | Freq: Two times a day (BID) | ORAL | Status: DC
  Administered 2024-03-05 – 2024-03-06 (×2): 1 via ORAL
  Filled 2024-03-05 (×2): qty 1

## 2024-03-05 MED ORDER — PANTOPRAZOLE SODIUM 40 MG PO TBEC
40.0000 mg | DELAYED_RELEASE_TABLET | Freq: Every day | ORAL | Status: DC
  Administered 2024-03-05 – 2024-03-19 (×15): 40 mg via ORAL
  Filled 2024-03-05 (×11): qty 1

## 2024-03-05 MED ORDER — NITROGLYCERIN 0.4 MG SL SUBL: 0.4000 mg | SUBLINGUAL_TABLET | SUBLINGUAL | Status: DC | PRN

## 2024-03-05 MED ORDER — NALOXONE HCL 0.4 MG/ML IJ SOLN: 0.4000 mg | INTRAMUSCULAR | Status: DC | PRN

## 2024-03-05 MED ORDER — ACETAMINOPHEN 325 MG PO TABS: 650.0000 mg | ORAL_TABLET | Freq: Four times a day (QID) | ORAL | Status: DC | PRN

## 2024-03-05 MED ORDER — ONDANSETRON HCL 4 MG PO TABS: 4.0000 mg | ORAL_TABLET | Freq: Four times a day (QID) | ORAL | Status: AC | PRN

## 2024-03-05 MED ORDER — APIXABAN 5 MG PO TABS
5.0000 mg | ORAL_TABLET | Freq: Two times a day (BID) | ORAL | Status: DC
  Administered 2024-03-05 – 2024-03-19 (×29): 5 mg via ORAL
  Filled 2024-03-05: qty 1
  Filled 2024-03-05: qty 2
  Filled 2024-03-05 (×22): qty 1

## 2024-03-05 MED ORDER — HYDROMORPHONE HCL 1 MG/ML IJ SOLN
0.5000 mg | INTRAMUSCULAR | Status: DC | PRN
  Administered 2024-03-05 – 2024-03-13 (×18): 0.5 mg via INTRAVENOUS
  Filled 2024-03-05 (×19): qty 1

## 2024-03-05 MED ORDER — FUROSEMIDE 10 MG/ML IJ SOLN
40.0000 mg | Freq: Two times a day (BID) | INTRAMUSCULAR | Status: DC
  Administered 2024-03-05: 15:00:00 40 mg via INTRAVENOUS
  Filled 2024-03-05 (×2): qty 4

## 2024-03-05 MED ORDER — SPIRONOLACTONE 25 MG PO TABS
25.0000 mg | ORAL_TABLET | Freq: Every day | ORAL | Status: DC
  Administered 2024-03-05 – 2024-03-19 (×15): 25 mg via ORAL
  Filled 2024-03-05 (×2): qty 1
  Filled 2024-03-05: qty 2
  Filled 2024-03-05 (×9): qty 1

## 2024-03-05 MED ORDER — DOCUSATE SODIUM 100 MG PO CAPS
100.0000 mg | ORAL_CAPSULE | Freq: Two times a day (BID) | ORAL | Status: DC
  Administered 2024-03-05: 12:00:00 100 mg via ORAL
  Filled 2024-03-05: qty 1

## 2024-03-05 MED ADMIN — Morphine Sulfate IV Soln PF 4 MG/ML: 4 mg | INTRAVENOUS | @ 01:00:00 | NDC 00641612501

## 2024-03-05 MED ADMIN — Ondansetron HCl Inj 4 MG/2ML (2 MG/ML): 4 mg | INTRAVENOUS | @ 21:00:00 | NDC 00409475518

## 2024-03-05 MED ADMIN — Ondansetron HCl Inj 4 MG/2ML (2 MG/ML): 4 mg | INTRAVENOUS | @ 15:00:00 | NDC 00409475518

## 2024-03-05 MED FILL — Morphine Sulfate IV Soln PF 4 MG/ML: 4.0000 mg | INTRAVENOUS | Qty: 1 | Status: AC

## 2024-03-05 MED FILL — Ondansetron HCl Inj 4 MG/2ML (2 MG/ML): 4.0000 mg | INTRAMUSCULAR | Qty: 2 | Status: AC

## 2024-03-05 NOTE — Assessment & Plan Note (Addendum)
 Continue atorvastatin  and ezetimibe 

## 2024-03-05 NOTE — Assessment & Plan Note (Addendum)
 Continue blood pressure control with bidil , entresto , spironolactone 

## 2024-03-05 NOTE — Progress Notes (Signed)
 Heart Failure Navigator Progress Note  Assessed for Heart & Vascular TOC clinic readiness.  Patient does not meet criteria due to he is an Advanced Heart Failure Team patient of Dr. Rolan. .   Navigator will sign off at this time.   Stephane Haddock, BSN, Scientist, clinical (histocompatibility and immunogenetics) Only

## 2024-03-05 NOTE — H&P (Addendum)
 History and Physical - Telemedicine  REGINALD WEIDA FMW:[id number] DOB: 08/24/1980 DOA: 03/04/2024  PCP: [name], MD  Patient coming from: home  Referring provider: Cameron Servant, PA EDP Telemedicine provider: Dr. Sherre Patient location: [facility] Emergency Department [place] Referring diagnosis: Heart failure exacerbation Patient name and DOB verified: Patient was able to verify her first and last name: Reginald Fernandez, date of birth: 1980-11-23. Patient consented to Telemedicine Evaluation: yes RN virtual assistant: Arlyne Fujisawa, RN Video encounter time and date: 03/05/2024 and at approximately: 10:10a  Chief Concern: Shortness of breath, abdominal pain, constipation  HPI: Reginald Fernandez is a 43 year old male with history of CAD, hypertension, hyperlipidemia, morbid obesity, history of PE on Eliquis , constipation, medication nonadherence.  03/04/2024: he presents emergency department for chief concerns of abdominal pain, constipation, shortness of breath since Friday.  Vitals at the time of my evaluation showed temperature of 98, respiration rate 17, heart rate 83, blood pressure 140/93, SpO2 97% on 2 L nasal cannula.  Serum sodium is 141, potassium 4.6, chloride 101, bicarb 29, BUN of 13, serum creatinine 1.20, nonfasting blood glucose 111, WBC 10.6, hemoglobin 15.5, platelet 271.  proBNP elevated at 3698.  High sensitive troponin is 47 and on repeat is 46.  COVID/influenza A/influenza B/RSV PCR were negative.  ED treatment: Furosemide  60 mg IV one-time dose, morphine  4 mg IV one-time dose, ondansetron  4 mg IV one-time dose.  CTA chest PE: Was read as no PE.  Three-vessel artery calcification.    CT abdomen pelvis: Read as no acute intra-abdominal findings.  03/05/2024: Patient admitted to [facility] for chief concerns of heart failure exacerbation secondary to medication nonadherence, and increased fluid intake. --------------------------- At  bedside, via telemedicine encounter, patient was able to confirm his first and last name and DOB.  Patient reports that he has been short of breath since Friday.  He reports that he has gained about 20 pounds since June 2025.  He reports this is due to to increase fluid intake and likely also increase in sedentary lifestyle without changes to his diet.  He reports he does not have any financial difficulty regarding obtaining his medications.  Reports he just does not take it regularly.  Approximate daily fluid intake: Water: 5-6 bottles per day (16 oz bottles) Juice: About 1 glass/day Tea: 3 glasses/week Sodas: 3 cans/week. Etoh: 1-2 drinks every 2 weeks.  He denies history of binge drinking.  Social history: Patient lives at home with his parents.  He denies tobacco and recreational drug use.  He infrequently drinks EtOH, last drink was about 2 to 3 weeks ago.  Patient denies history of binge drinking.  ROS: Constitutional: + weight change (+20 lbs since June 2025), no fever ENT/Mouth: no sore throat, no rhinorrhea Eyes: no eye pain, no vision changes Cardiovascular: no chest pain, + dyspnea,  no edema, no palpitations Respiratory: no cough, no sputum, no wheezing Gastrointestinal: no nausea, no vomiting, no diarrhea, + constipation Genitourinary: no urinary incontinence, no dysuria, no hematuria Musculoskeletal: no arthralgias, no myalgias Skin: no skin lesions, no pruritus, Neuro: no weakness, no loss of consciousness, no syncope Psych: no anxiety, no depression, no decrease appetite Heme/Lymph: no bruising, no bleeding  Assessment/Plan  Principal Problem:   Acute on chronic systolic heart failure (HCC) Active Problems:   Dilated cardiomyopathy (HCC)   Elevated troponin   Pulmonary emboli (HCC)   Morbid obesity (HCC)   Constipation   Essential hypertension   Hyperlipidemia LDL goal <70   CAD  S/P percutaneous coronary angioplasty: DES PCI -Cx-OM1, RCA x 2   Assessment and  Plan:  * Acute on chronic systolic heart failure (HCC) Secondary to medication nonadherence and increased fluid intake due to patient not understanding fluid restriction in setting of heart failure with reduced ejection fraction Continue strict I's and O's Status post furosemide  60 mg IV one-time dose per EDP Continue with furosemide  40 mg IV twice daily on admission Complete echo ordered  Dilated cardiomyopathy (HCC) Patient has not been taking medication for a while Farxiga  10 mg daily, meclizine 300 mg p.o. twice daily, amiodarone  200 mg daily resumed  Pulmonary emboli (HCC) Home Eliquis  5 mg p.o. twice daily resumed Patient takes this about 80% of the time and therefore has missed some Eliquis  dosing CTA chest was negative for PE  Elevated troponin Second troponin was 46 which came down from 47 I suspect this is secondary to demand ischemia.  I have low clinical suspicion for ACS at this time as patient denies chest pain, chest pressure.  Constipation Patient reports Lasix  medication causes him to have constipation and this is one of the reasons why he does not take his fluid medications regularly Senna-docusate, 1 tablet twice daily scheduled MiraLAX  twice daily as needed for moderate constipation  Morbid obesity (HCC) This complicates overall care and prognosis.   Hyperlipidemia LDL goal <70 Home atorvastatin  80 mg daily, ezetimibe  10 mg daily resumed  Essential hypertension Spironolactone  25 mg daily resumed  Complete echo on 08/04/2023: Estimated ejection fraction is less than 20% left ventricular demonstrates global hypokinesis.  Extensive counseling at bedside regarding medication compliance and drinking about 4 bottles of water per day.  Chart reviewed.   DVT prophylaxis: Eliquis  5 mg p.o. twice daily Code Status: Full code Diet: Heart healthy Family Communication: A phone call was offered, patient declined Disposition Plan: Pending clinical course Consults  called: Pharmacy Admission status: Telemetry  Past Medical History:  Diagnosis Date   CAD S/P percutaneous coronary angioplasty 11/20/2013   a. 09/2009: EKG with Inf STEMI - no obstructive CAD;  b. 11/2013 Inflat STEMI/PCI: LM nl, LAD 20p, D1 sm - diff dzs, D2 large - nl, LCX 95-99, OM1 100 (3.5x38 Promus Premier DES), RCA 95-26m, 80d (3.0x20 and 3.0x16 Promus Premier DES') - normal EF;  c. NSTEMI 5/18 - patent stents, otw minimal CAD.- EF by Echo 35-40%; d. NSTEMI 10/2018 - ? culprit - Small branch of  D2,Med Rx,.  EF by Echo ~25%   Cardiomyopathy, ischemic 07/2016   h/o Inf STEMI 11/2013 - EF was Normal; b) NSTEMI 07/2016 (NO Cultprit on Cath) - Echo EF 35-40% (Basal Ant-Lat HK, basal-mid Inferolateral & apical Akinesis); c) NSTEMI 10/2018 -felt to be occluded small branch of D2, Echo EF further decreased to 20-25%.    Essential hypertension    Hyperlipidemia with target LDL less than 100    Marijuana abuse    Migraine    Morbid obesity (HCC)    ST-segment elevation myocardial infarction (STEMI) of inferior wall (HCC) 11/20/2013   Past Surgical History:  Procedure Laterality Date   LEFT HEART CATH AND CORONARY ANGIOGRAPHY N/A 08/01/2016   Procedure: Left Heart Cath and Coronary Angiography;  Surgeon: Wonda Sharper, MD;  Location: [facility] INVASIVE CV LAB;;  Stable 2 V CAD - patent RCA & OM stents. Diffuse, non-obstructive CAD elsewhere. Moderately elevated LVEDP --> No culprit lesion  => restarted Plavix  & titrate Med Rx.   LEFT HEART CATH AND CORONARY ANGIOGRAPHY N/A 10/24/2018   Procedure: LEFT  HEART CATH AND CORONARY ANGIOGRAPHY;  Surgeon: Burnard Debby LABOR, MD;  Location: [facility] INVASIVE CV LAB;  60% small D1, D2 has 3 branches 1 of which is small and appears to be 100%, LCx-OM widely patent with mild 55% stenosis in the jailed AV groove LCx. RCA has mild luminal irregularities 30% proximal with widely patent stents in the mid and distal RCA. (Culprit 100% small branch of D2)   LEFT HEART CATH AND  CORONARY ANGIOGRAPHY  09/2009   (Presumably in the setting of STEMI) mild RCA luminal Irregularities - MIld INf HK -- Med Rx; b)   LEFT HEART CATHETERIZATION WITH CORONARY ANGIOGRAM N/A 11/20/2013   Procedure: LEFT HEART CATHETERIZATION WITH CORONARY ANGIOGRAM;  Surgeon: Alm LELON Clay, MD;  Location: [facility] CATH LAB;  Service: Cardiovascular;   Inf STEMI: OM1 100%, m-dRCA 95-99% thrombotic mRCA with tandem 80% -- PCI, LAD & Cx relatively normal.   PERCUTANEOUS CORONARY STENT INTERVENTION (PCI-S)  11/20/2013   a) PCI - OM1 Promus Premier DES 3.5 mm x 38 mm, m-d RCA Promus Premier DES  3.0 mm x 20 mm & 3.0 mm x 16 mm   RIGHT HEART CATH N/A 08/09/2023   Procedure: RIGHT HEART CATH;  Surgeon: Zenaida Morene PARAS, MD;  Location: [facility] INVASIVE CV LAB;  Service: Cardiovascular;  Laterality: N/A;   RIGHT/LEFT HEART CATH AND CORONARY ANGIOGRAPHY N/A 10/11/2021   Procedure: RIGHT/LEFT HEART CATH AND CORONARY ANGIOGRAPHY;  Surgeon: Rolan Ezra RAMAN, MD;  Location: [facility] INVASIVE CV LAB;  Service: Cardiovascular;  Laterality: N/A;   TRANSTHORACIC ECHOCARDIOGRAM  11/20/2013   LV EF 55-60%. No regional wall motion abnormality.  Normal valves    TRANSTHORACIC ECHOCARDIOGRAM  08/11/2016   (Non-STEMI-no culprit lesion): Moderately dilated LV.  Basal anterolateral HK with basal-mid inferolateral akinesis.  Mild-apical inferior akinesis.  EF 35 to 40%.  GRII DD.  Mild biatrial dilation.  Normal RV size and function. Mostly normal valves.   TRANSTHORACIC ECHOCARDIOGRAM  10/24/2018   (Non-STEMI-culprit lesion was 1 of 3 small 2 branches.)  EF 20-25%.  Mild mildly dilated LV.  GR 2 DD.  Mild biatrial dilation.  Normal PA pressures.   Social History:  reports that he quit smoking about 7 years ago. His smoking use included cigarettes. He has never used smokeless tobacco. He reports that he does not drink alcohol and does not use drugs.  Allergies[1] Family History  Problem Relation Age of Onset   Hypertension Mother    Heart  attack Father 29       2 MIs by age 18 (first at 38)-- CABG   Hypertension Father    Heart failure Father    Hyperlipidemia Father    Heart attack Maternal Grandfather        78's   Heart attack Paternal Grandfather        25's   Heart attack Other    Family history: Family history reviewed and not pertinent.  Prior to Admission medications  Medication Sig Start Date End Date Taking? Authorizing Provider  amiodarone  (PACERONE ) 200 MG tablet Take 2 tablets (400 mg total) by mouth 2 (two) times daily for 5 days, THEN 2 tablets (400 mg total) daily for 7 days, THEN 1 tablet (200 mg total) daily. 08/10/23 03/05/24 Yes Briana Elgin LABOR, MD  apixaban  (ELIQUIS ) 5 MG TABS tablet Take 2 tablets (10 mg total) by mouth 2 (two) times daily for 1 day, THEN 1 tablet (5 mg total) 2 (two) times daily. 08/10/23 03/05/24 Yes Briana Elgin LABOR,  MD  atorvastatin  (LIPITOR ) 80 MG tablet Take 1 tablet (80 mg total) by mouth daily at 6 PM 03/18/23  Yes Milford, Harlene HERO, FNP  dapagliflozin  propanediol (FARXIGA ) 10 MG TABS tablet Take 1 tablet (10 mg total) by mouth daily. 06/27/22  Yes Danton Jon HERO, PA-C  digoxin  (LANOXIN ) 0.125 MG tablet Take 1 tablet (0.125 mg total) by mouth daily. 07/20/22  Yes Clegg, Makesha Belitz D, NP  docusate sodium  (COLACE) 100 MG capsule Take 1 capsule (100 mg total) by mouth every 12 (twelve) hours. Patient taking differently: Take 100 mg by mouth as needed. 07/11/22  Yes Lynwood Lenis, PA-C  ezetimibe  (ZETIA ) 10 MG tablet Take 1 tablet (10 mg total) by mouth daily. 03/18/23  Yes Milford, Harlene HERO, FNP  mexiletine (MEXITIL ) 150 MG capsule Take 2 capsules (300 mg total) by mouth 2 (two) times daily. 08/10/23  Yes Briana Elgin LABOR, MD  pantoprazole  (PROTONIX ) 40 MG tablet Take 1 tablet (40 mg total) by mouth daily. 06/08/22  Yes Gonfa, Taye T, MD  spironolactone  (ALDACTONE ) 25 MG tablet Take 1 tablet (25 mg total) by mouth daily. 10/17/21  Yes Milford, Harlene HERO, FNP  torsemide  (DEMADEX ) 20 MG tablet Take  1 tablet (20 mg total) by mouth daily. 08/11/23 03/05/24 Yes Briana Elgin LABOR, MD  acetaminophen  (TYLENOL ) 325 MG tablet Take 2 tablets (650 mg total) by mouth every 6 (six) hours as needed for mild pain (or Fever >/= 101). 06/07/22   Gonfa, Mignon DASEN, MD  colchicine  0.6 MG tablet Take 1 tablet (0.6 mg total) by mouth daily for 7 days. 08/11/23 08/18/23  Briana Elgin LABOR, MD  nitroGLYCERIN  (NITROSTAT ) 0.4 MG SL tablet Place 1 tablet (0.4 mg total) under the tongue every 5 (five) minutes as needed for chest pain. 10/12/21   Marcine Catalan M, PA-C  senna-docusate (SENOKOT-S) 8.6-50 MG tablet Take 1 tablet by mouth 2 (two) times daily. 06/07/22   Gonfa, Taye T, MD  traMADol  (ULTRAM ) 50 MG tablet Take 1 tablet (50 mg total) by mouth every 6 (six) hours as needed. 07/11/22   Lynwood Lenis, PA-C   Physical Exam completed with assistance of: Arlyne Fujisawa, RN, who was at bedside during this portion of the virtual encounter:  Vitals:   03/05/24 1215 03/05/24 1230 03/05/24 1245 03/05/24 1300  BP:    (!) 164/106  Pulse: 82 63 93   Resp: 17 14    Temp:      TempSrc:      SpO2: 99% 99% 98%   Weight:       Constitutional: appears age-appropriate, NAD, calm Eyes: EOMI,  conjunctivae normal ENMT: Mucous membranes are moist. Hearing appropriate Neck: normal, supple, no masses, no thyromegaly Respiratory: clear to auscultation bilaterally, no wheezing. Normal respiratory effort. No accessory muscle use.  Cardiovascular: Regular rate and irregular rhythm, no murmurs. No extremity edema. Abdomen: no tenderness. Bowel sounds positive.  Musculoskeletal: No joint deformity upper and lower extremities. Good ROM, no contractures of bilateral upper extremities. Skin: no rashes, ulcers on visible skin Neurologic: Strength is appropriate upper extremities.  Psychiatric: Lacks judgment and insight in chronic medical condition. Alert and oriented x 3. Normal mood.   EKG: independently reviewed, showing sinus  tachycardia with rate of 118, QTc 419  Chest x-ray on Admission: I personally reviewed and I agree with radiologist reading as below.  CT Angio Chest PE W and/or Wo Contrast Result Date: 03/04/2024 EXAM: CTA CHEST 03/04/2024 09:05:28 PM TECHNIQUE: CTA of the chest was performed without and with  the administration of 100 mL of iohexol  (OMNIPAQUE ) 350 MG/ML injection. Multiplanar reformatted images are provided for review. MIP images are provided for review. Automated exposure control, iterative reconstruction, and/or weight based adjustment of the mA/kV was utilized to reduce the radiation dose to as low as reasonably achievable. COMPARISON: CT chest 5:19:25 cxr 03/04/24 CLINICAL HISTORY: Pulmonary embolism (PE) suspected, high prob. Patient reports shortness of breath since Friday. He also says he feels dehydrated from his fluid pills. He feels some chest congestion and tightness FINDINGS: PULMONARY ARTERIES: Pulmonary arteries are adequately opacified for evaluation. No acute pulmonary embolus is identified at the evaluable levels. Limited evaluation at the subsegmental level due to timing of contrast and motion artifact. Main pulmonary artery is normal in caliber. MEDIASTINUM: Cardiomegaly. At least 3-vessel coronary artery calcification. Incidentally noted aberrant right brachiocephalic artery. There is no acute abnormality of the thoracic aorta. LYMPH NODES: No mediastinal, hilar or axillary lymphadenopathy. LUNGS AND PLEURA: Diffuse mild bronchial wall thickening. Right middle lobe and linear atelectasis versus scarring. Interval resolution of right middle lobe consolidation. Expiratory phase of respiration with slight mosaic attenuation of the lower lung zones. Lingular atelectasis. No pulmonary edema. No evidence of pleural effusion or pneumothorax. UPPER ABDOMEN: Right posterior hemidiaphragmatic fat-containing hernia. SOFT TISSUES AND BONES: Trace bilateral gynecomastia. No acute bone abnormality.  IMPRESSION: 1. No pulmonary embolism identified, although evaluation is limited at the subsegmental level due to timing of contrast and motion artifact. 2. Cardiomegaly with at least 3-vessel coronary artery calcification. 3. Other, non-acute and/or normal findings as above. Electronically signed by: Morgane Naveau MD 03/04/2024 09:44 PM EST RP Workstation: [id number]   CT ABDOMEN PELVIS W CONTRAST Result Date: 03/04/2024 EXAM: CT ABDOMEN AND PELVIS WITH CONTRAST 03/04/2024 09:05:28 PM TECHNIQUE: CT of the abdomen and pelvis was performed with the administration of intravenous contrast (100 mL iohexol  (OMNIPAQUE ) 350 MG/ML injection). Multiplanar reformatted images are provided for review. Automated exposure control, iterative reconstruction, and/or weight-based adjustment of the mA/kV was utilized to reduce the radiation dose to as low as reasonably achievable. COMPARISON: CT angiography of the chest 03/04/2024. CT abdomen and pelvis 03:19:24 CLINICAL HISTORY: Abdominal pain, acute, nonlocalized. FINDINGS: LOWER CHEST: Please see separately dictated CT angiography of the chest 12:17:2025. LIVER: The liver is unremarkable. GALLBLADDER AND BILE DUCTS: Gallbladder is unremarkable. No biliary ductal dilatation. SPLEEN: No acute abnormality. PANCREAS: No acute abnormality. ADRENAL GLANDS: No acute abnormality. KIDNEYS, URETERS AND BLADDER: Bilateral renal cortical scarring. No stones in the kidneys or ureters. No hydronephrosis. No perinephric or periureteral stranding. Urinary bladder is unremarkable. GI AND BOWEL: Stomach demonstrates no acute abnormality. No small or large bowel thickening or dilatation. The appendix is unremarkable. Colonic diverticulosis. There is no bowel obstruction. PERITONEUM AND RETROPERITONEUM: No ascites. No free air. No abdominal or inguinal hernia. VASCULATURE: Aorta is normal in caliber. Moderate atherosclerotic plaque. LYMPH NODES: No lymphadenopathy. REPRODUCTIVE ORGANS: The  prostate is unremarkable. BONES AND SOFT TISSUES: Small retrolisthesis of L5 on S1. Intervertebral disc space vacuum phenomenon at the L5-S1 level. No acute osseous abnormality. No focal soft tissue abnormality. IMPRESSION: 1. No acute findings in the abdomen or pelvis. 2. Please see separately dictated CT angiography of the chest 12:17:2025. Electronically signed by: Morgane Naveau MD 03/04/2024 09:15 PM EST RP Workstation: [id number]   DG Chest 2 View Result Date: 03/04/2024 EXAM: 2 VIEW(S) XRAY OF THE CHEST 03/04/2024 06:44:00 PM COMPARISON: 08/03/2023 CLINICAL HISTORY: chest pain, shortness of breath FINDINGS: LUNGS AND PLEURA: Trace superimposed pulmonary edema. No pleural effusion. No pneumothorax.  HEART AND MEDIASTINUM: Stable cardiomegaly. Stable hilar enlargement likely reflecting pulmonary arterial enlargement in the setting of Pulmonary Arterial Hypertension. BONES AND SOFT TISSUES: No acute osseous abnormality. IMPRESSION: 1. Trace superimposed pulmonary edema, new from prior examination . 2. Stable cardiomegaly and hilar enlargement, likely reflecting pulmonary arterial enlargement in the setting of pulmonary arterial hypertension. Electronically signed by: Dorethia Molt MD 03/04/2024 06:48 PM EST RP Workstation: [id number]   Labs on Admission: I have personally reviewed following labs CBC: Recent Labs  Lab 03/04/24 1833  WBC 10.6*  HGB 15.4  HCT 45.5  MCV 92.1  PLT 271   Basic Metabolic Panel: Recent Labs  Lab 03/04/24 1915  NA 141  K 4.6  CL 101  CO2 29  GLUCOSE 111*  BUN 13  CREATININE 1.20  CALCIUM  9.9   GFR: CrCl cannot be calculated (Unknown ideal weight.). Liver Function Tests: Recent Labs  Lab 03/04/24 1915  AST 24  ALT 21  ALKPHOS 82  BILITOT 4.1*  PROT 8.3*  ALBUMIN 4.7   Recent Labs  Lab 03/04/24 1915  LIPASE 23   BNP (last 3 results) Recent Labs    03/04/24 1833  PROBNP 3,698.0*   Urine analysis:    Component Value Date/Time    COLORURINE YELLOW 03/04/2024 2121   APPEARANCEUR CLEAR 03/04/2024 2121   LABSPEC 1.030 03/04/2024 2121   PHURINE 6.0 03/04/2024 2121   GLUCOSEU NEGATIVE 03/04/2024 2121   HGBUR TRACE (A) 03/04/2024 2121   BILIRUBINUR NEGATIVE 03/04/2024 2121   KETONESUR NEGATIVE 03/04/2024 2121   PROTEINUR 100 (A) 03/04/2024 2121   NITRITE NEGATIVE 03/04/2024 2121   LEUKOCYTESUR TRACE (A) 03/04/2024 2121   This document was prepared using Dragon Voice Recognition software and may include unintentional dictation errors.  Dr. Sherre [facility] Location: [place]  If 7PM-7AM, please contact overnight-coverage provider If 7AM-7PM, please contact day attending provider [email or web address]  03/05/2024, 1:28 PM      [1]  Allergies Allergen Reactions   Penicillins Other (See Comments)    Childhood allergy Unknown reaction  Did it involve swelling of the face/tongue/throat, SOB, or low BP? No Did it involve sudden or severe rash/hives, skin peeling, or any reaction on the inside of your mouth or nose? No Did you need to seek medical attention at a hospital or doctor's office? No When did it last happen?      child If all above answers are NO, may proceed with cephalosporin use.

## 2024-03-05 NOTE — Progress Notes (Signed)
 Patient was seen and examined-on room air-not in any distress-continue treatment plan as outlined by Dr. Sherre

## 2024-03-05 NOTE — Assessment & Plan Note (Deleted)
 Secondary to medication nonadherence and increased fluid intake due to patient not understanding fluid restriction in setting of heart failure with reduced ejection fraction Continue strict I's and O's Status post furosemide  60 mg IV one-time dose per EDP Continue with furosemide  40 mg IV twice daily on admission Complete echo ordered

## 2024-03-05 NOTE — Assessment & Plan Note (Addendum)
 Echocardiogram with reduced LV systolic function with EF < 20 %, global hypokinesis, moderate dilatated cavity, grade II diastolic dysfunction with pseudo normalization EA, severe reduction of RV systolic function, LA with severe dilation, RA with mild dilatation, no significant valvular disease.   Urine output is 2,425 ml Systolic blood pressure 120 mmHg.  SV02 61.8  Plan to continue medical therapy with SGLT 2 inh, digoxin , bidil , entresto , and sprionolactone Inotropic support with milrinone  0.125 mcg/kg/min  Loop diuretic with torsemide  40 mg po daily.   NSVT/ PVC Continue with amiodarone  and mexiletine   Plan for  home inotropic support and follow up as outpatient for possible heart transplant at tertiary care center.  lifeVest has been approved

## 2024-03-05 NOTE — Hospital Course (Addendum)
 Mr. Cromartie was admitted to the hospital with the working diagnosis of heart failure exacerbation.   Patient 44 year old gentleman history of CAD, hypertension, hyperlipidemia, morbid obesity, history of PE and medication nonadherence due to financial constraints presented to the ED with abdominal pain, constipation, and dyspnea.  Patient reported 6 days of worsening symptoms prior to admission, along with a 20 lbs weight gain since June 2025.  On his initial physical examination his blood pressure was 164/101, HR 82, RR 17 and 02 saturation 98% Lungs with no wheezing or rhonchi, heart with S1 and S2 present and regular with no gallops, abdomen protuberant, soft and non tender, no lower extremity edema.   Patient placed on IV furosemide  for diuresis.  12/19 noted low output state and placed on IV milrinone  for inotropic support.    12/23 cardiac catheterization, normal LV filling pressure, but persistent low output failure due to biventricular dysfunction.  12/25 pending arrangement for outpatient home inotropic support, possible referral for heart transplant at tertiary care.  12/26 lifeVest has been approved, pending home milrinone   12/27 pending approval of  home inotropic support  12/28 pending home milrinone   12/29 chest tunneled PICC in place per IR  12/30 pending insurance approval for home milrinone 

## 2024-03-05 NOTE — Progress Notes (Signed)
 [facility] night cross cover note:   I was notified by the patient's RN of pt's request for pain meds for the abdominal pain with which he had presented. I subsequently added as needed IV Dilaudid  to the existing order for as needed acetaminophen .    Eva Pore, DO Hospitalist

## 2024-03-05 NOTE — ED Notes (Signed)
 Called Monisha at [facility] for transport 12:47-TC

## 2024-03-05 NOTE — Assessment & Plan Note (Deleted)
 Patient reports Lasix  medication causes him to have constipation and this is one of the reasons why he does not take his fluid medications regularly Senna-docusate, 1 tablet twice daily scheduled MiraLAX  twice daily as needed for moderate constipation

## 2024-03-05 NOTE — Assessment & Plan Note (Addendum)
 Calculated BMI 35.3

## 2024-03-05 NOTE — Assessment & Plan Note (Addendum)
 Continue anticoagulation with apixaban 

## 2024-03-05 NOTE — Assessment & Plan Note (Deleted)
 Second troponin was 46 which came down from 47 I suspect this is secondary to demand ischemia.  I have low clinical suspicion for ACS at this time as patient denies chest pain, chest pressure.

## 2024-03-05 NOTE — Plan of Care (Signed)

## 2024-03-05 NOTE — Progress Notes (Signed)
 Echocardiogram 2D Echocardiogram has been performed.  Reginald Fernandez 03/05/2024, 5:53 PM

## 2024-03-06 ENCOUNTER — Other Ambulatory Visit: Payer: Self-pay

## 2024-03-06 DIAGNOSIS — I10 Essential (primary) hypertension: Secondary | ICD-10-CM

## 2024-03-06 DIAGNOSIS — R109 Unspecified abdominal pain: Secondary | ICD-10-CM | POA: Diagnosis not present

## 2024-03-06 DIAGNOSIS — N179 Acute kidney failure, unspecified: Secondary | ICD-10-CM | POA: Diagnosis present

## 2024-03-06 DIAGNOSIS — Z9861 Coronary angioplasty status: Secondary | ICD-10-CM | POA: Diagnosis not present

## 2024-03-06 DIAGNOSIS — I509 Heart failure, unspecified: Secondary | ICD-10-CM | POA: Diagnosis not present

## 2024-03-06 DIAGNOSIS — F17290 Nicotine dependence, other tobacco product, uncomplicated: Secondary | ICD-10-CM | POA: Diagnosis present

## 2024-03-06 DIAGNOSIS — Z01810 Encounter for preprocedural cardiovascular examination: Secondary | ICD-10-CM | POA: Diagnosis not present

## 2024-03-06 DIAGNOSIS — I5084 End stage heart failure: Secondary | ICD-10-CM | POA: Diagnosis present

## 2024-03-06 DIAGNOSIS — R7989 Other specified abnormal findings of blood chemistry: Secondary | ICD-10-CM | POA: Diagnosis not present

## 2024-03-06 DIAGNOSIS — Z8249 Family history of ischemic heart disease and other diseases of the circulatory system: Secondary | ICD-10-CM | POA: Diagnosis not present

## 2024-03-06 DIAGNOSIS — Z79899 Other long term (current) drug therapy: Secondary | ICD-10-CM | POA: Diagnosis not present

## 2024-03-06 DIAGNOSIS — E66812 Obesity, class 2: Secondary | ICD-10-CM | POA: Diagnosis present

## 2024-03-06 DIAGNOSIS — I2510 Atherosclerotic heart disease of native coronary artery without angina pectoris: Secondary | ICD-10-CM | POA: Diagnosis present

## 2024-03-06 DIAGNOSIS — I2694 Multiple subsegmental pulmonary emboli without acute cor pulmonale: Secondary | ICD-10-CM

## 2024-03-06 DIAGNOSIS — I5023 Acute on chronic systolic (congestive) heart failure: Secondary | ICD-10-CM

## 2024-03-06 DIAGNOSIS — I472 Ventricular tachycardia, unspecified: Secondary | ICD-10-CM | POA: Diagnosis present

## 2024-03-06 DIAGNOSIS — Z91148 Patient's other noncompliance with medication regimen for other reason: Secondary | ICD-10-CM | POA: Diagnosis not present

## 2024-03-06 DIAGNOSIS — R1084 Generalized abdominal pain: Secondary | ICD-10-CM | POA: Insufficient documentation

## 2024-03-06 DIAGNOSIS — Z955 Presence of coronary angioplasty implant and graft: Secondary | ICD-10-CM | POA: Diagnosis not present

## 2024-03-06 DIAGNOSIS — Z1152 Encounter for screening for COVID-19: Secondary | ICD-10-CM | POA: Diagnosis not present

## 2024-03-06 DIAGNOSIS — I255 Ischemic cardiomyopathy: Secondary | ICD-10-CM | POA: Diagnosis present

## 2024-03-06 DIAGNOSIS — E860 Dehydration: Secondary | ICD-10-CM | POA: Diagnosis present

## 2024-03-06 DIAGNOSIS — Z880 Allergy status to penicillin: Secondary | ICD-10-CM | POA: Diagnosis not present

## 2024-03-06 DIAGNOSIS — I252 Old myocardial infarction: Secondary | ICD-10-CM | POA: Diagnosis not present

## 2024-03-06 DIAGNOSIS — Z86711 Personal history of pulmonary embolism: Secondary | ICD-10-CM | POA: Diagnosis not present

## 2024-03-06 DIAGNOSIS — I5082 Biventricular heart failure: Secondary | ICD-10-CM | POA: Diagnosis not present

## 2024-03-06 DIAGNOSIS — I420 Dilated cardiomyopathy: Secondary | ICD-10-CM

## 2024-03-06 DIAGNOSIS — E785 Hyperlipidemia, unspecified: Secondary | ICD-10-CM | POA: Diagnosis present

## 2024-03-06 DIAGNOSIS — Z86718 Personal history of other venous thrombosis and embolism: Secondary | ICD-10-CM | POA: Diagnosis not present

## 2024-03-06 DIAGNOSIS — E871 Hypo-osmolality and hyponatremia: Secondary | ICD-10-CM | POA: Diagnosis present

## 2024-03-06 DIAGNOSIS — Z7189 Other specified counseling: Secondary | ICD-10-CM | POA: Diagnosis not present

## 2024-03-06 DIAGNOSIS — Z91141 Patient's other noncompliance with medication regimen due to financial hardship: Secondary | ICD-10-CM | POA: Diagnosis not present

## 2024-03-06 DIAGNOSIS — I110 Hypertensive heart disease with heart failure: Secondary | ICD-10-CM | POA: Diagnosis present

## 2024-03-06 DIAGNOSIS — K761 Chronic passive congestion of liver: Secondary | ICD-10-CM | POA: Diagnosis present

## 2024-03-06 DIAGNOSIS — Z515 Encounter for palliative care: Secondary | ICD-10-CM | POA: Diagnosis not present

## 2024-03-06 DIAGNOSIS — Z7901 Long term (current) use of anticoagulants: Secondary | ICD-10-CM | POA: Diagnosis not present

## 2024-03-06 LAB — CBC
HCT: 44.4 % (ref 39.0–52.0)
Hemoglobin: 15.1 g/dL (ref 13.0–17.0)
MCH: 31.9 pg (ref 26.0–34.0)
MCHC: 34 g/dL (ref 30.0–36.0)
MCV: 93.9 fL (ref 80.0–100.0)
Platelets: 264 K/uL (ref 150–400)
RBC: 4.73 MIL/uL (ref 4.22–5.81)
RDW: 13.8 % (ref 11.5–15.5)
WBC: 9.9 K/uL (ref 4.0–10.5)
nRBC: 0 % (ref 0.0–0.2)

## 2024-03-06 LAB — BASIC METABOLIC PANEL WITH GFR
Anion gap: 12 (ref 5–15)
BUN: 17 mg/dL (ref 6–20)
CO2: 27 mmol/L (ref 22–32)
Calcium: 9.1 mg/dL (ref 8.9–10.3)
Chloride: 97 mmol/L — ABNORMAL LOW (ref 98–111)
Creatinine, Ser: 1.25 mg/dL — ABNORMAL HIGH (ref 0.61–1.24)
GFR, Estimated: >60 mL/min
Glucose, Bld: 132 mg/dL — ABNORMAL HIGH (ref 70–99)
Potassium: 3.6 mmol/L (ref 3.5–5.1)
Sodium: 136 mmol/L (ref 135–145)

## 2024-03-06 LAB — MAGNESIUM
Magnesium: 2.3 mg/dL (ref 1.7–2.4)
Magnesium: 2.4 mg/dL (ref 1.7–2.4)

## 2024-03-06 LAB — COOXEMETRY PANEL
Carboxyhemoglobin: 0.6 % (ref 0.5–1.5)
Carboxyhemoglobin: 1.6 % — ABNORMAL HIGH (ref 0.5–1.5)
Methemoglobin: <0.7 % (ref 0.0–1.5)
Methemoglobin: 0.7 % (ref 0.0–1.5)
O2 Saturation: 40.9 %
O2 Saturation: 59.6 %
Total hemoglobin: 15.3 g/dL (ref 12.0–16.0)
Total hemoglobin: 15.5 g/dL (ref 12.0–16.0)

## 2024-03-06 LAB — LACTIC ACID, PLASMA
Lactic Acid, Venous: 1.1 mmol/L (ref 0.5–1.9)
Lactic Acid, Venous: 1.1 mmol/L (ref 0.5–1.9)

## 2024-03-06 LAB — TYPE AND SCREEN
ABO/RH(D): B POS
Antibody Screen: NEGATIVE

## 2024-03-06 LAB — POTASSIUM: Potassium: 4.2 mmol/L (ref 3.5–5.1)

## 2024-03-06 MED ORDER — FUROSEMIDE 10 MG/ML IJ SOLN: 80.0000 mg | Freq: Two times a day (BID) | INTRAMUSCULAR | Status: DC

## 2024-03-06 MED ORDER — DIGOXIN 125 MCG PO TABS
0.1250 mg | ORAL_TABLET | Freq: Every day | ORAL | Status: DC
  Administered 2024-03-06 – 2024-03-11 (×6): 0.125 mg via ORAL
  Filled 2024-03-06 (×6): qty 1

## 2024-03-06 MED ORDER — SODIUM CHLORIDE 0.9% FLUSH
10.0000 mL | Freq: Two times a day (BID) | INTRAVENOUS | Status: DC
  Administered 2024-03-06 – 2024-03-14 (×16): 10 mL
  Administered 2024-03-15: 30 mL
  Administered 2024-03-15 – 2024-03-16 (×3): 10 mL
  Administered 2024-03-17: 20 mL
  Administered 2024-03-17 – 2024-03-19 (×4): 10 mL

## 2024-03-06 MED ORDER — MILRINONE LACTATE IN DEXTROSE 20-5 MG/100ML-% IV SOLN
0.1250 ug/kg/min | INTRAVENOUS | Status: DC
  Administered 2024-03-06 – 2024-03-08 (×6): 0.25 ug/kg/min via INTRAVENOUS
  Administered 2024-03-09 – 2024-03-10 (×2): 0.125 ug/kg/min via INTRAVENOUS
  Filled 2024-03-06 (×8): qty 100

## 2024-03-06 MED ORDER — SACUBITRIL-VALSARTAN 24-26 MG PO TABS
1.0000 | ORAL_TABLET | Freq: Two times a day (BID) | ORAL | Status: DC
  Administered 2024-03-06 – 2024-03-16 (×22): 1 via ORAL
  Filled 2024-03-06 (×20): qty 1

## 2024-03-06 MED ORDER — POTASSIUM CHLORIDE CRYS ER 20 MEQ PO TBCR
40.0000 meq | EXTENDED_RELEASE_TABLET | Freq: Once | ORAL | Status: AC
  Administered 2024-03-06: 40 meq via ORAL
  Filled 2024-03-06: qty 2

## 2024-03-06 MED ORDER — SORBITOL 70 % SOLN
30.0000 mL | Status: DC
  Administered 2024-03-06: 30 mL via ORAL
  Filled 2024-03-06: qty 30

## 2024-03-06 MED ORDER — SODIUM CHLORIDE 0.9% FLUSH: 10.0000 mL | INTRAVENOUS | Status: DC | PRN

## 2024-03-06 MED ORDER — FUROSEMIDE 10 MG/ML IJ SOLN
40.0000 mg | Freq: Two times a day (BID) | INTRAMUSCULAR | Status: DC
  Administered 2024-03-06: 40 mg via INTRAVENOUS

## 2024-03-06 MED ORDER — ACETAMINOPHEN 650 MG RE SUPP: 650.0000 mg | Freq: Four times a day (QID) | RECTAL | Status: AC | PRN

## 2024-03-06 MED ORDER — FUROSEMIDE 10 MG/ML IJ SOLN
80.0000 mg | Freq: Three times a day (TID) | INTRAMUSCULAR | Status: DC
  Administered 2024-03-06 – 2024-03-08 (×8): 80 mg via INTRAVENOUS
  Filled 2024-03-06 (×8): qty 8

## 2024-03-06 MED ORDER — SENNOSIDES-DOCUSATE SODIUM 8.6-50 MG PO TABS: 1.0000 | ORAL_TABLET | Freq: Every evening | ORAL | Status: DC | PRN

## 2024-03-06 MED ORDER — CHLORHEXIDINE GLUCONATE CLOTH 2 % EX PADS
6.0000 | MEDICATED_PAD | Freq: Every day | CUTANEOUS | Status: DC
  Administered 2024-03-06 – 2024-03-19 (×13): 6 via TOPICAL

## 2024-03-06 MED ORDER — SENNOSIDES-DOCUSATE SODIUM 8.6-50 MG PO TABS: 1.0000 | ORAL_TABLET | Freq: Every day | ORAL | Status: DC

## 2024-03-06 MED ORDER — POTASSIUM CHLORIDE CRYS ER 20 MEQ PO TBCR: 40.0000 meq | EXTENDED_RELEASE_TABLET | Freq: Once | ORAL | Status: DC

## 2024-03-06 MED ORDER — POLYETHYLENE GLYCOL 3350 17 G PO PACK
17.0000 g | PACK | Freq: Two times a day (BID) | ORAL | Status: DC
  Administered 2024-03-06: 17 g via ORAL
  Filled 2024-03-06: qty 1

## 2024-03-06 MED ORDER — SMOG ENEMA
960.0000 mL | Freq: Once | RECTAL | Status: DC
  Filled 2024-03-06: qty 960

## 2024-03-06 MED ORDER — ACETAMINOPHEN 500 MG PO TABS
1000.0000 mg | ORAL_TABLET | Freq: Four times a day (QID) | ORAL | Status: AC | PRN
  Administered 2024-03-06 – 2024-03-09 (×3): 1000 mg via ORAL
  Filled 2024-03-06 (×3): qty 2

## 2024-03-06 MED ORDER — METHOCARBAMOL 500 MG PO TABS
500.0000 mg | ORAL_TABLET | Freq: Three times a day (TID) | ORAL | Status: DC | PRN
  Administered 2024-03-06 – 2024-03-16 (×11): 500 mg via ORAL
  Filled 2024-03-06 (×11): qty 1

## 2024-03-06 MED ORDER — POTASSIUM CHLORIDE CRYS ER 20 MEQ PO TBCR
40.0000 meq | EXTENDED_RELEASE_TABLET | Freq: Two times a day (BID) | ORAL | Status: DC
  Administered 2024-03-06 – 2024-03-08 (×6): 40 meq via ORAL
  Filled 2024-03-06 (×6): qty 2

## 2024-03-06 MED ORDER — POLYETHYLENE GLYCOL 3350 17 G PO PACK: 17.0000 g | PACK | Freq: Every day | ORAL | Status: DC

## 2024-03-06 MED ORDER — POLYETHYLENE GLYCOL 3350 17 G PO PACK
17.0000 g | PACK | Freq: Every day | ORAL | Status: DC | PRN
  Administered 2024-03-07: 17 g via ORAL
  Filled 2024-03-06: qty 1

## 2024-03-06 MED ORDER — AMIODARONE HCL 200 MG PO TABS
400.0000 mg | ORAL_TABLET | Freq: Two times a day (BID) | ORAL | Status: DC
  Administered 2024-03-06 – 2024-03-18 (×25): 400 mg via ORAL
  Filled 2024-03-06 (×19): qty 2

## 2024-03-06 NOTE — Progress Notes (Signed)
" °  PICC placed. CO-OX 41%. QX Calculate Fick CO 2.9 CI 1.13  Started on milrinone  0.25 mcg about 1 hour ago.    CVP 10-11  Repeat CO-OX now. Check lactic acid now.   Jefferey Lippmann NP-C    4:15 PM  "

## 2024-03-06 NOTE — Progress Notes (Signed)
 Peripherally Inserted Central Catheter Placement  The IV Nurse has discussed with the patient and/or persons authorized to consent for the patient, the purpose of this procedure and the potential benefits and risks involved with this procedure.  The benefits include less needle sticks, lab draws from the catheter, and the patient may be discharged home with the catheter. Risks include, but not limited to, infection, bleeding, blood clot (thrombus formation), and puncture of an artery; nerve damage and irregular heartbeat and possibility to perform a PICC exchange if needed/ordered by physician.  Alternatives to this procedure were also discussed.  Bard Power PICC patient education guide, fact sheet on infection prevention and patient information card has been provided to patient /or left at bedside.    PICC Placement Documentation  PICC Double Lumen 03/06/24 Right Brachial 44 cm 1 cm (Active)  Indication for Insertion or Continuance of Line Vasoactive infusions 03/06/24 1425  Exposed Catheter (cm) 1 cm 03/06/24 1425  Site Assessment Clean, Dry, Intact 03/06/24 1425  Lumen #1 Status Flushed;Blood return noted;Saline locked 03/06/24 1425  Lumen #2 Status Flushed;Blood return noted;Saline locked 03/06/24 1425  Dressing Type Transparent 03/06/24 1425  Dressing Status Antimicrobial disc/dressing in place 03/06/24 1425  Line Care Connections checked and tightened 03/06/24 1425  Line Adjustment (NICU/IV Team Only) No 03/06/24 1425  Dressing Intervention New dressing 03/06/24 1425  Dressing Change Due 03/13/24 03/06/24 1425       Reginald Fernandez 03/06/2024, 2:26 PM

## 2024-03-06 NOTE — TOC Initial Note (Signed)
 Transition of Care [facility]) - Initial/Assessment Note    Patient Details  Name: Reginald Fernandez MRN: [id number] Date of Birth: Jul 27, 1980  [facility]) CM/SW Contact:    Reginald Fernandez Phone Number: [phone] 03/06/2024, 3:41 PM  Clinical Narrative: HF CSW met with patient at bedside. Patient stated that he lives with his parents. Patient stated that he has no HH/DME history. Patient stated that he has a scale at home. Patient stated that he works full-time, but does not need a work note at this time. Patient stated that he has a PCP. CSW explained that a hospital follow up appointment is typically scheduled closer towards dc. Patient is agreeable, and requested a morning appointment. Patient stated that family will provide transportation at dc.   HF CSW/CM will continue to follow and monitor for dc readiness.                         Patient Goals and CMS Choice            Expected Discharge Plan and Services                                              Prior Living Arrangements/Services                       Activities of Daily Living      Permission Sought/Granted                  Emotional Assessment              Admission diagnosis:  Acute on chronic systolic heart failure (HCC) [I50.23] Generalized abdominal pain [R10.84] Acute on chronic systolic congestive heart failure (HCC) [I50.23] Patient Active Problem List   Diagnosis Date Noted   Constipation 03/05/2024   Acute on chronic systolic heart failure (HCC) 03/04/2024   Obesity, 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   Pneumonia of right lower lobe due to infectious organism 08/04/2023   Pulmonary emboli (HCC) 08/03/2023   Acute pyelonephritis 06/07/2022   Acute pulmonary embolism (HCC) 06/06/2022   Elevated troponin 06/04/2022   Acute on chronic combined systolic and diastolic CHF (congestive heart failure) (HCC) 06/04/2022   CHF (congestive heart failure) (HCC) 10/09/2021    Educated about COVID-19 virus infection 12/06/2019   Prediabetes 10/25/2018   Dilated cardiomyopathy (HCC) 08/01/2016   Demand ischemia of myocardium (HCC) 07/31/2016   Snoring 02/22/2014   Daytime somnolence 02/22/2014   Snores 11/23/2013   Marijuana abuse    Morbid obesity (HCC)    History of acute inferior wall MI 11/20/2013   ST elevation myocardial infarction (STEMI) of true posterior wall, subsequent episode of care [facility]) 11/20/2013   CAD S/P percutaneous coronary angioplasty: DES PCI -Cx-OM1, RCA x 2 11/20/2013    Class: Status post   Essential hypertension    Hyperlipidemia LDL goal <70    HYPERGLYCEMIA 10/25/2009   ASTHMA 10/24/2009   PCP:  Reginald Barnie NOVAK, MD Pharmacy:   [facility] [address], [address] [place] [address] Phone: [phone] Fax: [phone]  [facility] [address] [place] [address] Phone: [phone] Fax: [phone]  [id number] - [place], [place] - [address] AT [address] [facility] OF [facility] & [address] [place] [address] Phone: [phone] Fax: [phone]     Social Drivers of Health (SDOH) Social History: SDOH Screenings   Food Insecurity: No Food Insecurity (03/06/2024)  Housing: Low Risk (03/06/2024)  Transportation Needs: No Transportation Needs (03/06/2024)  Utilities: Not At Risk (03/06/2024)  Depression (PHQ2-9): Low Risk (06/27/2022)  Tobacco Use: Medium Risk (03/04/2024)   SDOH Interventions:     Readmission Risk Interventions     No data to display

## 2024-03-06 NOTE — Consult Note (Addendum)
 "   Advanced Heart Failure Team Consult Note   Primary Physician: [name], MD Cardiologist:  Alm Clay, MD HPI:    Reginald Fernandez is seen today for evaluation of heart failure at the request of Dr Sebastian .   Reginald Fernandez is a 43 y.o. male with CAD s/p multiple MIs, initial inferior STEMI in 2011, HTN, HLD, tobacco use, chronic HFrEF, and previously poor medication compliance.   Over the last several years he has had multiple caths. Last LHC 2023 diffuse CAD but no severe stenoses that would explain worsening of EF.    RHC 2025 RA 12, PA 60/40 (47), PCW 35, SVR 1810 Fick CO 3.08, CI 1.3, Thermo CO 4.1 CI 1.7   No Showed/cancelled HF & VAD appointment May June and July 2025.  Last Thursday he stopped all medications except for lasix  and eliqius. Says he started having trouble eating food. Complaining of constipation. Having difficulty walking out of his house. +Orthopnea. He has been working 14 hour days and 2 of his 3 children live with him.   Presented to [facility] with nausea/vomiting and shortness of breath. ProBNP [id number]. Resp Panel negative, HS Trop 46>47, Creatinine 1.2, dig level < 0.6. T Bili 4.1. Echo  LVEF < 20% RV severely reduced. CT abdomen no acute findings. CTA- NO PE. Started on IV lasix .    Home Medications Prior to Admission medications  Medication Sig Start Date End Date Taking? Authorizing Provider  acetaminophen  (TYLENOL ) 325 MG tablet Take 2 tablets (650 mg total) by mouth every 6 (six) hours as needed for mild pain (or Fever >/= 101). 06/07/22  Yes Gonfa, Taye T, MD  amiodarone  (PACERONE ) 200 MG tablet Take 2 tablets (400 mg total) by mouth 2 (two) times daily for 5 days, THEN 2 tablets (400 mg total) daily for 7 days, THEN 1 tablet (200 mg total) daily. 08/10/23 03/05/24 Yes Briana Elgin LABOR, MD  apixaban  (ELIQUIS ) 5 MG TABS tablet Take 2 tablets (10 mg total) by mouth 2 (two) times daily for 1 day, THEN 1 tablet (5 mg total) 2 (two) times daily. 08/10/23  03/05/24 Yes Briana Elgin LABOR, MD  atorvastatin  (LIPITOR ) 80 MG tablet Take 1 tablet (80 mg total) by mouth daily at 6 PM 03/18/23  Yes Milford, Harlene HERO, FNP  dapagliflozin  propanediol (FARXIGA ) 10 MG TABS tablet Take 1 tablet (10 mg total) by mouth daily. 06/27/22  Yes McClung, Angela M, PA-C  digoxin  (LANOXIN ) 0.125 MG tablet Take 1 tablet (0.125 mg total) by mouth daily. 07/20/22  Yes Kei Mcelhiney D, NP  docusate sodium  (COLACE) 100 MG capsule Take 1 capsule (100 mg total) by mouth every 12 (twelve) hours. Patient taking differently: Take 100 mg by mouth as needed. 07/11/22  Yes Lynwood Alm, PA-C  ezetimibe  (ZETIA ) 10 MG tablet Take 1 tablet (10 mg total) by mouth daily. 03/18/23  Yes Milford, Harlene HERO, FNP  mexiletine (MEXITIL ) 150 MG capsule Take 2 capsules (300 mg total) by mouth 2 (two) times daily. 08/10/23  Yes Briana Elgin LABOR, MD  nitroGLYCERIN  (NITROSTAT ) 0.4 MG SL tablet Place 1 tablet (0.4 mg total) under the tongue every 5 (five) minutes as needed for chest pain. 10/12/21  Yes Marcine Catalan M, PA-C  pantoprazole  (PROTONIX ) 40 MG tablet Take 1 tablet (40 mg total) by mouth daily. 06/08/22  Yes Gonfa, Taye T, MD  sacubitril -valsartan  (ENTRESTO ) 24-26 MG Take 1 tablet by mouth 2 (two) times daily.   Yes [provider]  senna-docusate (  SENOKOT-S) 8.6-50 MG tablet Take 1 tablet by mouth 2 (two) times daily. 06/07/22  Yes Gonfa, Taye T, MD  spironolactone  (ALDACTONE ) 25 MG tablet Take 1 tablet (25 mg total) by mouth daily. 10/17/21  Yes Milford, Harlene HERO, FNP  torsemide  (DEMADEX ) 20 MG tablet Take 1 tablet (20 mg total) by mouth daily. Patient taking differently: Take 20 mg by mouth 2 (two) times daily. 08/11/23 03/05/24 Yes Briana Elgin LABOR, MD  traMADol  (ULTRAM ) 50 MG tablet Take 1 tablet (50 mg total) by mouth every 6 (six) hours as needed. Patient taking differently: Take 50 mg by mouth every 6 (six) hours as needed for moderate pain (pain score 4-6). 07/11/22  Yes Lynwood Lenis, PA-C   colchicine  0.6 MG tablet Take 1 tablet (0.6 mg total) by mouth daily for 7 days. 08/11/23 08/18/23  Briana Elgin LABOR, MD    Past Medical History: Past Medical History:  Diagnosis Date   CAD S/P percutaneous coronary angioplasty 11/20/2013   a. 09/2009: EKG with Inf STEMI - no obstructive CAD;  b. 11/2013 Inflat STEMI/PCI: LM nl, LAD 20p, D1 sm - diff dzs, D2 large - nl, LCX 95-99, OM1 100 (3.5x38 Promus Premier DES), RCA 95-68m, 80d (3.0x20 and 3.0x16 Promus Premier DES') - normal EF;  c. NSTEMI 5/18 - patent stents, otw minimal CAD.- EF by Echo 35-40%; d. NSTEMI 10/2018 - ? culprit - Small branch of  D2,Med Rx,.  EF by Echo ~25%   Cardiomyopathy, ischemic 07/2016   h/o Inf STEMI 11/2013 - EF was Normal; b) NSTEMI 07/2016 (NO Cultprit on Cath) - Echo EF 35-40% (Basal Ant-Lat HK, basal-mid Inferolateral & apical Akinesis); c) NSTEMI 10/2018 -felt to be occluded small branch of D2, Echo EF further decreased to 20-25%.    Essential hypertension    Hyperlipidemia with target LDL less than 100    Marijuana abuse    Migraine    Morbid obesity (HCC)    ST-segment elevation myocardial infarction (STEMI) of inferior wall (HCC) 11/20/2013    Past Surgical History: Past Surgical History:  Procedure Laterality Date   LEFT HEART CATH AND CORONARY ANGIOGRAPHY N/A 08/01/2016   Procedure: Left Heart Cath and Coronary Angiography;  Surgeon: Wonda Sharper, MD;  Location: [facility] INVASIVE CV LAB;;  Stable 2 V CAD - patent RCA & OM stents. Diffuse, non-obstructive CAD elsewhere. Moderately elevated LVEDP --> No culprit lesion  => restarted Plavix  & titrate Med Rx.   LEFT HEART CATH AND CORONARY ANGIOGRAPHY N/A 10/24/2018   Procedure: LEFT HEART CATH AND CORONARY ANGIOGRAPHY;  Surgeon: Burnard Debby LABOR, MD;  Location: [facility] INVASIVE CV LAB;  60% small D1, D2 has 3 branches 1 of which is small and appears to be 100%, LCx-OM widely patent with mild 55% stenosis in the jailed AV groove LCx. RCA has mild luminal irregularities 30%  proximal with widely patent stents in the mid and distal RCA. (Culprit 100% small branch of D2)   LEFT HEART CATH AND CORONARY ANGIOGRAPHY  09/2009   (Presumably in the setting of STEMI) mild RCA luminal Irregularities - MIld INf HK -- Med Rx; b)   LEFT HEART CATHETERIZATION WITH CORONARY ANGIOGRAM N/A 11/20/2013   Procedure: LEFT HEART CATHETERIZATION WITH CORONARY ANGIOGRAM;  Surgeon: Lenis LELON Clay, MD;  Location: [facility] CATH LAB;  Service: Cardiovascular;   Inf STEMI: OM1 100%, m-dRCA 95-99% thrombotic mRCA with tandem 80% -- PCI, LAD & Cx relatively normal.   PERCUTANEOUS CORONARY STENT INTERVENTION (PCI-S)  11/20/2013   a) PCI - OM1  Promus Premier DES 3.5 mm x 38 mm, m-d RCA Promus Premier DES  3.0 mm x 20 mm & 3.0 mm x 16 mm   RIGHT HEART CATH N/A 08/09/2023   Procedure: RIGHT HEART CATH;  Surgeon: Zenaida Morene PARAS, MD;  Location: [facility] INVASIVE CV LAB;  Service: Cardiovascular;  Laterality: N/A;   RIGHT/LEFT HEART CATH AND CORONARY ANGIOGRAPHY N/A 10/11/2021   Procedure: RIGHT/LEFT HEART CATH AND CORONARY ANGIOGRAPHY;  Surgeon: Rolan Ezra RAMAN, MD;  Location: [facility] INVASIVE CV LAB;  Service: Cardiovascular;  Laterality: N/A;   TRANSTHORACIC ECHOCARDIOGRAM  11/20/2013   LV EF 55-60%. No regional wall motion abnormality.  Normal valves    TRANSTHORACIC ECHOCARDIOGRAM  08/11/2016   (Non-STEMI-no culprit lesion): Moderately dilated LV.  Basal anterolateral HK with basal-mid inferolateral akinesis.  Mild-apical inferior akinesis.  EF 35 to 40%.  GRII DD.  Mild biatrial dilation.  Normal RV size and function. Mostly normal valves.   TRANSTHORACIC ECHOCARDIOGRAM  10/24/2018   (Non-STEMI-culprit lesion was 1 of 3 small 2 branches.)  EF 20-25%.  Mild mildly dilated LV.  GR 2 DD.  Mild biatrial dilation.  Normal PA pressures.    Family History: Family History  Problem Relation Age of Onset   Hypertension Mother    Heart attack Father 75       2 MIs by age 40 (first at 43)-- CABG   Hypertension Father     Heart failure Father    Hyperlipidemia Father    Heart attack Maternal Grandfather        27's   Heart attack Paternal Grandfather        54's   Heart attack Other     Social History: Social History   Socioeconomic History   Marital status: Married    Spouse name: Not on file   Number of children: 2   Years of education: Not on file   Highest education level: Not on file  Occupational History    Employer: [employer]    Comment: [employer]  Tobacco Use   Smoking status: Former    Current packs/day: 0.00    Types: Cigarettes    Quit date: 09/2016    Years since quitting: 7.4   Smokeless tobacco: Never  Vaping Use   Vaping status: Every Day  Substance and Sexual Activity   Alcohol use: No    Alcohol/week: 0.0 standard drinks of alcohol   Drug use: No   Sexual activity: Yes  Other Topics Concern   Not on file  Social History Narrative   Lives in [place] with wife.  Works with mentally challenged adults.   Social Drivers of Health   Tobacco Use: Medium Risk (03/04/2024)   Patient History    Smoking Tobacco Use: Former    Smokeless Tobacco Use: Never    Passive Exposure: Not on file  Financial Resource Strain: Not on file  Food Insecurity: No Food Insecurity (08/03/2023)   Hunger Vital Sign    Worried About Running Out of Food in the Last Year: Never true    Ran Out of Food in the Last Year: Never true  Transportation Needs: No Transportation Needs (08/03/2023)   PRAPARE - Administrator, Civil Service (Medical): No    Lack of Transportation (Non-Medical): No  Physical Activity: Not on file  Stress: Not on file  Social Connections: Not on file  Depression (PHQ2-9): Low Risk (06/27/2022)   Depression (PHQ2-9)    PHQ-2 Score: 2  Alcohol Screen:  Not on file  Housing: Low Risk (08/03/2023)   Housing Stability Vital Sign    Unable to Pay for Housing in the Last Year: No    Number of Times Moved in the Last Year: 1    Homeless in  the Last Year: No  Utilities: Not At Risk (08/03/2023)   AHC Utilities    Threatened with loss of utilities: No  Health Literacy: Not on file    Allergies:  Allergies[1]  Objective:   Vital Signs:   Temp:  [97.8 F (36.6 C)-98.5 F (36.9 C)] 97.8 F (36.6 C) (12/19 1118) Pulse Rate:  [45-93] 89 (12/19 1118) Resp:  [12-19] 16 (12/19 1118) BP: (119-173)/(83-106) 134/87 (12/19 1118) SpO2:  [90 %-100 %] 99 % (12/19 1118) Weight:  [125.8 kg] 125.8 kg (12/19 0348) Last BM Date : 03/04/24  Weight change: Filed Weights   03/04/24 2118 03/06/24 0348  Weight: 126.1 kg 125.8 kg    Intake/Output:   Intake/Output Summary (Last 24 hours) at 03/06/2024 1157 Last data filed at 03/06/2024 1121 Gross per 24 hour  Intake 480 ml  Output 2550 ml  Net -2070 ml    Physical Exam  General:  Weak  appearing.  Sitting on the side of the bed.  Cor: Regular rate & rhythm. No murmurs. JVD 11-12  cm.  Lungs: clear Extremities: no edema . Cool extremities  Telemetry   SR with NSVT /PVCS   EKG    SR with freqent PVCS   Labs   Basic Metabolic Panel: Recent Labs  Lab 03/04/24 1915 03/06/24 0328  NA 141 136  K 4.6 3.6  CL 101 97*  CO2 29 27  GLUCOSE 111* 132*  BUN 13 17  CREATININE 1.20 1.25*  CALCIUM  9.9 9.1    Liver Function Tests: Recent Labs  Lab 03/04/24 1915  AST 24  ALT 21  ALKPHOS 82  BILITOT 4.1*  PROT 8.3*  ALBUMIN 4.7   Recent Labs  Lab 03/04/24 1915  LIPASE 23   No results for input(s): AMMONIA in the last 168 hours.  CBC: Recent Labs  Lab 03/04/24 1833 03/06/24 0328  WBC 10.6* 9.9  HGB 15.4 15.1  HCT 45.5 44.4  MCV 92.1 93.9  PLT 271 264    Cardiac Enzymes: No results for input(s): CKTOTAL, CKMB, CKMBINDEX, TROPONINI in the last 168 hours.  BNP: BNP (last 3 results) Recent Labs    08/03/23 1028  BNP 892.8*    ProBNP (last 3 results) Recent Labs    03/04/24 1833  PROBNP 3,698.0*     CBG: No results for input(s):  GLUCAP in the last 168 hours.  Coagulation Studies: No results for input(s): LABPROT, INR in the last 72 hours.   Imaging   ECHOCARDIOGRAM COMPLETE Result Date: 03/05/2024    ECHOCARDIOGRAM REPORT   Patient Name:   Reginald Fernandez Date of Exam: 03/05/2024 Medical Rec #:  [id number]      Height:       74.0 in Accession #:    [id number]     Weight:       277.9 lb Date of Birth:  November 05, 1980      BSA:          2.500 m Patient Age:    43 years       BP:           151/106 mmHg Patient Gender: M              HR:  94 bpm. Exam Location:  Inpatient Procedure: 2D Echo, Cardiac Doppler, Color Doppler and Intracardiac            Opacification Agent (Both Spectral and Color Flow Doppler were            utilized during procedure). Indications:    Dyspnea                 Elevated Troponin  History:        Patient has prior history of Echocardiogram examinations, most                 recent 08/04/2023. CHF and Cardiomyopathy, CAD and Previous                 Myocardial Infarction; Risk Factors:Hypertension, Dyslipidemia                 and Former Smoker.  Sonographer:    Juliene Rucks Referring Phys: [id number] Barnell Shieh N [facility]  Sonographer Comments: Patient is obese. IMPRESSIONS  1. Left ventricular ejection fraction, by estimation, is <20%. The left ventricle has severely decreased function. The left ventricle demonstrates global hypokinesis. The left ventricular internal cavity size was moderately dilated. Left ventricular diastolic parameters are consistent with Grade II diastolic dysfunction (pseudonormalization).  2. Right ventricular systolic function is severely reduced. The right ventricular size is normal. There is normal pulmonary artery systolic pressure.  3. Left atrial size was severely dilated.  4. Right atrial size was mildly dilated.  5. The mitral valve is normal in structure. Trivial mitral valve regurgitation. No evidence of mitral stenosis.  6. The aortic valve is tricuspid. Aortic valve  regurgitation is not visualized. No aortic stenosis is present.  7. The inferior vena cava is dilated in size with >50% respiratory variability, suggesting right atrial pressure of 8 mmHg. FINDINGS  Left Ventricle: Left ventricular ejection fraction, by estimation, is <20%. The left ventricle has severely decreased function. The left ventricle demonstrates global hypokinesis. Definity  contrast agent was given IV to delineate the left ventricular endocardial borders. The left ventricular internal cavity size was moderately dilated. There is no left ventricular hypertrophy. Left ventricular diastolic parameters are consistent with Grade II diastolic dysfunction (pseudonormalization). Right Ventricle: The right ventricular size is normal. No increase in right ventricular wall thickness. Right ventricular systolic function is severely reduced. There is normal pulmonary artery systolic pressure. The tricuspid regurgitant velocity is 1.47 m/s, and with an assumed right atrial pressure of 8 mmHg, the estimated right ventricular systolic pressure is 16.6 mmHg. Left Atrium: Left atrial size was severely dilated. Right Atrium: Right atrial size was mildly dilated. Pericardium: Trivial pericardial effusion is present. Mitral Valve: The mitral valve is normal in structure. Trivial mitral valve regurgitation. No evidence of mitral valve stenosis. Tricuspid Valve: The tricuspid valve is normal in structure. Tricuspid valve regurgitation is trivial. No evidence of tricuspid stenosis. Aortic Valve: The aortic valve is tricuspid. Aortic valve regurgitation is not visualized. No aortic stenosis is present. Pulmonic Valve: The pulmonic valve was normal in structure. Pulmonic valve regurgitation is not visualized. No evidence of pulmonic stenosis. Aorta: The aortic root is normal in size and structure. Venous: The inferior vena cava is dilated in size with greater than 50% respiratory variability, suggesting right atrial pressure of 8  mmHg. IAS/Shunts: No atrial level shunt detected by color flow Doppler.  LEFT VENTRICLE PLAX 2D LVIDd:         6.50 cm   Diastology LVIDs:  5.90 cm   LV e' medial:  6.45 cm/s LV PW:         1.00 cm   LV e' lateral: 6.68 cm/s LV IVS:        1.00 cm LVOT diam:     2.00 cm LVOT Area:     3.14 cm  RIGHT VENTRICLE            IVC RV Basal diam:  3.30 cm    IVC diam: 2.30 cm RV Mid diam:    3.10 cm RV S prime:     9.75 cm/s TAPSE (M-mode): 1.2 cm LEFT ATRIUM            Index        RIGHT ATRIUM           Index LA diam:      5.90 cm  2.36 cm/m   RA Area:     25.20 cm LA Vol (A2C): 114.0 ml 45.61 ml/m  RA Volume:   83.00 ml  33.21 ml/m   AORTA Ao Root diam: 3.00 cm Ao Asc diam:  3.20 cm TRICUSPID VALVE TR Peak grad:   8.6 mmHg TR Vmax:        147.00 cm/s  SHUNTS Systemic Diam: 2.00 cm Annabella Scarce MD Electronically signed by Annabella Scarce MD Signature Date/Time: 03/05/2024/6:08:00 PM    Final     Medications:   Current Medications:  amiodarone   200 mg Oral Daily   apixaban   5 mg Oral BID   atorvastatin   80 mg Oral q1800   dapagliflozin  propanediol  10 mg Oral Daily   ezetimibe   10 mg Oral Daily   furosemide   40 mg Intravenous BID   mexiletine  300 mg Oral BID   pantoprazole   40 mg Oral Daily   polyethylene glycol  17 g Oral BID   senna-docusate  1 tablet Oral BID   sorbitol   30 mL Oral Q3H   SMOG  960 mL Rectal Once   spironolactone   25 mg Oral Daily    Infusions:  Patient Profile   Reginald Fernandez is a 43 y.o. male with CAD s/p multiple MIs, initial inferior STEMI in 2011, HTN, HLD, tobacco use, chronic HFrEF, and  previously poor compliance. He has been doing much better with medications.     Assessment/Plan   #A/C Biventricular HFrEF, 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 Followed in the HF clinic with previous discussions about advanced therapies. We are still concerned he may need advanced therapies. Suspect abdominal issues are related to low output  HF.  Check Blood Type.  NYHA Stage D. Needs PICC placed. Check CO-OX.  -Add milrinone  0.25 mcg.   - Volume overloaded. Increase lasix  80 mg twice a day. Supp K.  -Add digoxin  0.125 mg daily  -Entresto  24-26 mg twice a day. -Add Spiro 25 mg daily   - Will need RHC next week.   #NSVT/PVCs   - High PVC burden.  -Continue amio 200 mg daily + mexitil  300 mg twice a day.  -Follow K and Mag. Supp K  -Check TSH  # Abdominal Pain -CT negative. Suspect this is related to low output heart failure.   # Elevated TBili -Suspect in the setting of low output HF. Follow daily CMET  # CAD  Strong family history of premature CAD.  H/o inferolateral STEMI in 9/15 with DES to LCx into OM1 and DES to RCA.  LHC this admit 7/23 demonstrated diffuse CAD  but no severe stenosis to explain worsening of his EF.   No chest pain  Continue atorvastatin  and zetia   # H/O PE/DVT Continue eliquis  5 mg twice a day    Length of Stay: 0  Alianis Trimmer, NP  03/06/2024, 11:57 AM  Advanced Heart Failure Team Pager [phone] (M-F; 7a - 5p)   Please visit [email or web address]: For overnight coverage please call cardiology fellow first. If fellow not available call Shock/ECMO MD on call.  For ECMO / Mechanical Support (Impella, IABP, LVAD) issues call Shock / ECMO MD on call.      [1]  Allergies Allergen Reactions   Penicillins Other (See Comments)    Childhood allergy Unknown reaction  Did it involve swelling of the face/tongue/throat, SOB, or low BP? No Did it involve sudden or severe rash/hives, skin peeling, or any reaction on the inside of your mouth or nose? No Did you need to seek medical attention at a hospital or doctor's office? No When did it last happen?      child If all above answers are NO, may proceed with cephalosporin use.   "

## 2024-03-06 NOTE — Progress Notes (Signed)
 " [name]  FMW:[id number] DOB: [birth year] DOA: 03/04/2024 PCP: [name], MD    Chief Complaint  Patient presents with   Chest Pain   Shortness of Breath   Dehydration   Abdominal Pain    Brief Narrative:  Patient 43 year old gentleman history of CAD, hypertension, hyperlipidemia, morbid obesity, history of PE on Eliquis , constipation, medication nonadherence due to financial constraints presented to the ED with abdominal pain, constipation, shortness of breath.  Patient noted on admission to have elevated proBNP, admitted for acute CHF exacerbation and constipation.  Patient placed on IV Lasix .  Cardiology consulted.   Assessment & Plan:   Principal Problem:   Acute on chronic systolic heart failure (HCC) Active Problems:   Dilated cardiomyopathy (HCC)   Elevated troponin   Pulmonary emboli (HCC)   Morbid obesity (HCC)   Constipation   Essential hypertension   Hyperlipidemia LDL goal <70   CAD S/P percutaneous coronary angioplasty: DES PCI -Cx-OM1, RCA x 2   Generalized abdominal pain  #1 acute on chronic systolic heart failure/dilated cardiomyopathy - Patient presented with shortness of breath, abdominal discomfort, medication nonadherence in the past per patient secondary to financial constraints (due to not having insurance). - Repeat 2D echo with EF <20%, severely decreased left ventricular function, left ventricular global hypokinesis, grade 2 DD, severely reduced right ventricular systolic function, normal pulmonary artery systolic pressure, severely dilated left atrial size, mildly dilated right atrial size, trivial MVR. - proBNP elevated at 3698. - Patient placed on Lasix  40 mg IV every 12 hours with urine output of 1.5 L over the past 24 hours. - Due to patient's dilated cardiomyopathy, complicated cardiac history cardiology consulted and patient seen by the advanced heart failure team. - Cardiology recommending PICC placement to check  coags. - Patient to be started on milrinone  and Lasix  dose increased to 80 mg IV every 12 hours. -Patient started on digoxin , Entresto , spironolactone . - Cardiology recommending RHC next week. - Appreciate cardiology input and recommendation.  2.  NSVT/PVCs -Patient noted to have runs of NSVT on telemetry and noted to have multiple PVCs. - Continue amiodarone  200 mg daily. - Patient is to be started on Mexitil  300 mg twice daily per cardiology.  3.  Abdominal pain/abdominal discomfort - Initially felt secondary to constipation. -CT abdomen and pelvis with no acute abnormalities noted. - Patient however stated he cleansed his bowels prior to admission. - Likely secondary to low cardiac output. - Follow.  4.  Elevated bilirubin - Felt likely secondary to low cardiac output - Follow.  5.  History of PE/DVT -Continue Eliquis .  6.  Elevated troponin/CAD -Likely secondary to demand ischemia as troponin levels have plateaued. - Patient denies any chest pain. -Patient with history of CAD with inferolateral STEMI 9/15 with DES to left circumflex into OM1 and DES to RCA.  Patient with family history of premature CAD. -Patient with LHC 7/23 that showed diffuse CAD but no severe stenosis noted. - Follow.  7.  Hyperlipidemia - Continue atorvastatin  80 mg daily, ezetimibe  10 mg daily.  8.  Hypertension -Continue spironolactone  - Patient on IV Lasix .  9.  Morbid obesity -Lifestyle modification. - Outpatient follow-up with PCP.   DVT prophylaxis: Eliquis  Code Status: Full Family Communication: Updated patient.  No family at bedside. Disposition: Likely home when clinically improved and cleared by cardiology.  Status is: Inpatient Remains inpatient appropriate because: Severity of illness   Consultants:  Cardiology/advanced heart failure team: Dr. Zenaida 05/08/2023  Procedures:  CT angiogram chest 03/04/2024 CT abdomen and pelvis 03/04/2024 2D echo  05/07/2023   Antimicrobials:  Anti-infectives (From admission, onward)    None         Subjective: Patient sitting up on the side of the bed, advanced heart failure clinic PA in room assessing patient.  Patient denies any chest pain.  Denies any significant shortness of breath.  Still with abdominal discomfort.  Patient states cleansed his bowels prior to admission.  States he feels full and unable to eat.  States his urine was dark and recently felt some relief after urine is lighter now.  Objective: Vitals:   03/06/24 0348 03/06/24 0725 03/06/24 1118 03/06/24 1518  BP: 133/86 119/88 134/87 (!) 125/101  Pulse:  (!) 56 89 66  Resp:  16 16 16   Temp: 98.5 F (36.9 C) 98.3 F (36.8 C) 97.8 F (36.6 C) (!) 97.5 F (36.4 C)  TempSrc: Oral Oral Oral Oral  SpO2: 98% 100% 99% 91%  Weight: 125.8 kg       Intake/Output Summary (Last 24 hours) at 03/06/2024 1724 Last data filed at 03/06/2024 1306 Gross per 24 hour  Intake 480 ml  Output 3025 ml  Net -2545 ml   Filed Weights   03/04/24 2118 03/06/24 0348  Weight: 126.1 kg 125.8 kg    Examination:  General exam: Appears calm and comfortable  Respiratory system: Decreased breath sounds in the bases.  Some bibasilar crackles.  No wheezing.  Fair air movement.  No use of accessory muscles of respiration clear to auscultation. Cardiovascular system: S1 & S2 heard, RRR. No JVD, murmurs, rubs, gallops or clicks.  1-2+ bilateral lower extremity edema.  Gastrointestinal system: Abdomen is nondistended, soft and nontender. No organomegaly or masses felt. Normal bowel sounds heard. Central nervous system: Alert and oriented. No focal neurological deficits. Extremities: 1-2+ bilateral lower extremity edema.  Skin: No rashes, lesions or ulcers Psychiatry: Judgement and insight appear normal. Mood & affect appropriate.     Data Reviewed: I have personally reviewed following labs and imaging studies  CBC: Recent Labs  Lab  03/04/24 1833 03/06/24 0328  WBC 10.6* 9.9  HGB 15.4 15.1  HCT 45.5 44.4  MCV 92.1 93.9  PLT 271 264    Basic Metabolic Panel: Recent Labs  Lab 03/04/24 1915 03/06/24 0328  NA 141 136  K 4.6 3.6  CL 101 97*  CO2 29 27  GLUCOSE 111* 132*  BUN 13 17  CREATININE 1.20 1.25*  CALCIUM  9.9 9.1    GFR: CrCl cannot be calculated (Unknown ideal weight.).  Liver Function Tests: Recent Labs  Lab 03/04/24 1915  AST 24  ALT 21  ALKPHOS 82  BILITOT 4.1*  PROT 8.3*  ALBUMIN 4.7    CBG: No results for input(s): GLUCAP in the last 168 hours.   Recent Results (from the past 240 hours)  Resp panel by RT-PCR (RSV, Flu A&B, Covid) Anterior Nasal Swab     Status: None   Collection Time: 03/04/24  6:33 PM   Specimen: Anterior Nasal Swab  Result Value Ref Range Status   SARS Coronavirus 2 by RT PCR NEGATIVE NEGATIVE Final    Comment: (NOTE) SARS-CoV-2 target nucleic acids are NOT DETECTED.  The SARS-CoV-2 RNA is generally detectable in upper respiratory specimens during the acute phase of infection. The lowest concentration of SARS-CoV-2 viral copies this assay can detect is 138 copies/mL. A negative result does not preclude SARS-Cov-2 infection and should not be used as the  sole basis for treatment or other patient management decisions. A negative result may occur with  improper specimen collection/handling, submission of specimen other than nasopharyngeal swab, presence of viral mutation(s) within the areas targeted by this assay, and inadequate number of viral copies(<138 copies/mL). A negative result must be combined with clinical observations, patient history, and epidemiological information. The expected result is Negative.  Fact Sheet for Patients:  bloggercourse.com  Fact Sheet for Healthcare Providers:  seriousbroker.it  This test is no t yet approved or cleared by the [place]  FDA and  has been  authorized for detection and/or diagnosis of SARS-CoV-2 by FDA under an Emergency Use Authorization (EUA). This EUA will remain  in effect (meaning this test can be used) for the duration of the COVID-19 declaration under Section 564(b)(1) of the Act, 21 U.S.C.section 360bbb-3(b)(1), unless the authorization is terminated  or revoked sooner.       Influenza A by PCR NEGATIVE NEGATIVE Final   Influenza B by PCR NEGATIVE NEGATIVE Final    Comment: (NOTE) The Xpert Xpress SARS-CoV-2/FLU/RSV plus assay is intended as an aid in the diagnosis of influenza from Nasopharyngeal swab specimens and should not be used as a sole basis for treatment. Nasal washings and aspirates are unacceptable for Xpert Xpress SARS-CoV-2/FLU/RSV testing.  Fact Sheet for Patients: bloggercourse.com  Fact Sheet for Healthcare Providers: seriousbroker.it  This test is not yet approved or cleared by the [place]  FDA and has been authorized for detection and/or diagnosis of SARS-CoV-2 by FDA under an Emergency Use Authorization (EUA). This EUA will remain in effect (meaning this test can be used) for the duration of the COVID-19 declaration under Section 564(b)(1) of the Act, 21 U.S.C. section 360bbb-3(b)(1), unless the authorization is terminated or revoked.     Resp Syncytial Virus by PCR NEGATIVE NEGATIVE Final    Comment: (NOTE) Fact Sheet for Patients: bloggercourse.com  Fact Sheet for Healthcare Providers: seriousbroker.it  This test is not yet approved or cleared by the [place]  FDA and has been authorized for detection and/or diagnosis of SARS-CoV-2 by FDA under an Emergency Use Authorization (EUA). This EUA will remain in effect (meaning this test can be used) for the duration of the COVID-19 declaration under Section 564(b)(1) of the Act, 21 U.S.C. section 360bbb-3(b)(1), unless the  authorization is terminated or revoked.  Performed at [facility], [address], [place], [place] [address]          Radiology Studies: US  EKG SITE RITE Result Date: 03/06/2024 If Site Rite image not attached, placement could not be confirmed due to current cardiac rhythm.  ECHOCARDIOGRAM COMPLETE Result Date: 03/05/2024    ECHOCARDIOGRAM REPORT   Patient Name:   Reginald Fernandez Date of Exam: 03/05/2024 Medical Rec #:  [id number]      Height:       74.0 in Accession #:    [id number]     Weight:       277.9 lb Date of Birth:  1980/04/11      BSA:          2.500 m Patient Age:    43 years       BP:           151/106 mmHg Patient Gender: M              HR:           94 bpm. Exam Location:  Inpatient Procedure: 2D Echo, Cardiac Doppler, Color Doppler and Intracardiac  Opacification Agent (Both Spectral and Color Flow Doppler were            utilized during procedure). Indications:    Dyspnea                 Elevated Troponin  History:        Patient has prior history of Echocardiogram examinations, most                 recent 08/04/2023. CHF and Cardiomyopathy, CAD and Previous                 Myocardial Infarction; Risk Factors:Hypertension, Dyslipidemia                 and Former Smoker.  Sonographer:    Juliene Rucks Referring Phys: [id number] AMY N COX  Sonographer Comments: Patient is obese. IMPRESSIONS  1. Left ventricular ejection fraction, by estimation, is <20%. The left ventricle has severely decreased function. The left ventricle demonstrates global hypokinesis. The left ventricular internal cavity size was moderately dilated. Left ventricular diastolic parameters are consistent with Grade II diastolic dysfunction (pseudonormalization).  2. Right ventricular systolic function is severely reduced. The right ventricular size is normal. There is normal pulmonary artery systolic pressure.  3. Left atrial size was severely dilated.  4. Right atrial size was mildly  dilated.  5. The mitral valve is normal in structure. Trivial mitral valve regurgitation. No evidence of mitral stenosis.  6. The aortic valve is tricuspid. Aortic valve regurgitation is not visualized. No aortic stenosis is present.  7. The inferior vena cava is dilated in size with >50% respiratory variability, suggesting right atrial pressure of 8 mmHg. FINDINGS  Left Ventricle: Left ventricular ejection fraction, by estimation, is <20%. The left ventricle has severely decreased function. The left ventricle demonstrates global hypokinesis. Definity  contrast agent was given IV to delineate the left ventricular endocardial borders. The left ventricular internal cavity size was moderately dilated. There is no left ventricular hypertrophy. Left ventricular diastolic parameters are consistent with Grade II diastolic dysfunction (pseudonormalization). Right Ventricle: The right ventricular size is normal. No increase in right ventricular wall thickness. Right ventricular systolic function is severely reduced. There is normal pulmonary artery systolic pressure. The tricuspid regurgitant velocity is 1.47 m/s, and with an assumed right atrial pressure of 8 mmHg, the estimated right ventricular systolic pressure is 16.6 mmHg. Left Atrium: Left atrial size was severely dilated. Right Atrium: Right atrial size was mildly dilated. Pericardium: Trivial pericardial effusion is present. Mitral Valve: The mitral valve is normal in structure. Trivial mitral valve regurgitation. No evidence of mitral valve stenosis. Tricuspid Valve: The tricuspid valve is normal in structure. Tricuspid valve regurgitation is trivial. No evidence of tricuspid stenosis. Aortic Valve: The aortic valve is tricuspid. Aortic valve regurgitation is not visualized. No aortic stenosis is present. Pulmonic Valve: The pulmonic valve was normal in structure. Pulmonic valve regurgitation is not visualized. No evidence of pulmonic stenosis. Aorta: The aortic  root is normal in size and structure. Venous: The inferior vena cava is dilated in size with greater than 50% respiratory variability, suggesting right atrial pressure of 8 mmHg. IAS/Shunts: No atrial level shunt detected by color flow Doppler.  LEFT VENTRICLE PLAX 2D LVIDd:         6.50 cm   Diastology LVIDs:         5.90 cm   LV e' medial:  6.45 cm/s LV PW:         1.00 cm  LV e' lateral: 6.68 cm/s LV IVS:        1.00 cm LVOT diam:     2.00 cm LVOT Area:     3.14 cm  RIGHT VENTRICLE            IVC RV Basal diam:  3.30 cm    IVC diam: 2.30 cm RV Mid diam:    3.10 cm RV S prime:     9.75 cm/s TAPSE (M-mode): 1.2 cm LEFT ATRIUM            Index        RIGHT ATRIUM           Index LA diam:      5.90 cm  2.36 cm/m   RA Area:     25.20 cm LA Vol (A2C): 114.0 ml 45.61 ml/m  RA Volume:   83.00 ml  33.21 ml/m   AORTA Ao Root diam: 3.00 cm Ao Asc diam:  3.20 cm TRICUSPID VALVE TR Peak grad:   8.6 mmHg TR Vmax:        147.00 cm/s  SHUNTS Systemic Diam: 2.00 cm Annabella Scarce MD Electronically signed by Annabella Scarce MD Signature Date/Time: 03/05/2024/6:08:00 PM    Final    CT Angio Chest PE W and/or Wo Contrast Result Date: 03/04/2024 EXAM: CTA CHEST 03/04/2024 09:05:28 PM TECHNIQUE: CTA of the chest was performed without and with the administration of 100 mL of iohexol  (OMNIPAQUE ) 350 MG/ML injection. Multiplanar reformatted images are provided for review. MIP images are provided for review. Automated exposure control, iterative reconstruction, and/or weight based adjustment of the mA/kV was utilized to reduce the radiation dose to as low as reasonably achievable. COMPARISON: CT chest 5:19:25 cxr 03/04/24 CLINICAL HISTORY: Pulmonary embolism (PE) suspected, high prob. Patient reports shortness of breath since Friday. He also says he feels dehydrated from his fluid pills. He feels some chest congestion and tightness FINDINGS: PULMONARY ARTERIES: Pulmonary arteries are adequately opacified for evaluation. No  acute pulmonary embolus is identified at the evaluable levels. Limited evaluation at the subsegmental level due to timing of contrast and motion artifact. Main pulmonary artery is normal in caliber. MEDIASTINUM: Cardiomegaly. At least 3-vessel coronary artery calcification. Incidentally noted aberrant right brachiocephalic artery. There is no acute abnormality of the thoracic aorta. LYMPH NODES: No mediastinal, hilar or axillary lymphadenopathy. LUNGS AND PLEURA: Diffuse mild bronchial wall thickening. Right middle lobe and linear atelectasis versus scarring. Interval resolution of right middle lobe consolidation. Expiratory phase of respiration with slight mosaic attenuation of the lower lung zones. Lingular atelectasis. No pulmonary edema. No evidence of pleural effusion or pneumothorax. UPPER ABDOMEN: Right posterior hemidiaphragmatic fat-containing hernia. SOFT TISSUES AND BONES: Trace bilateral gynecomastia. No acute bone abnormality. IMPRESSION: 1. No pulmonary embolism identified, although evaluation is limited at the subsegmental level due to timing of contrast and motion artifact. 2. Cardiomegaly with at least 3-vessel coronary artery calcification. 3. Other, non-acute and/or normal findings as above. Electronically signed by: Morgane Naveau MD 03/04/2024 09:44 PM EST RP Workstation: [id number]   CT ABDOMEN PELVIS W CONTRAST Result Date: 03/04/2024 EXAM: CT ABDOMEN AND PELVIS WITH CONTRAST 03/04/2024 09:05:28 PM TECHNIQUE: CT of the abdomen and pelvis was performed with the administration of intravenous contrast (100 mL iohexol  (OMNIPAQUE ) 350 MG/ML injection). Multiplanar reformatted images are provided for review. Automated exposure control, iterative reconstruction, and/or weight-based adjustment of the mA/kV was utilized to reduce the radiation dose to as low as reasonably achievable. COMPARISON: CT angiography of the chest 03/04/2024. CT  abdomen and pelvis 03:19:24 CLINICAL HISTORY: Abdominal pain,  acute, nonlocalized. FINDINGS: LOWER CHEST: Please see separately dictated CT angiography of the chest 12:17:2025. LIVER: The liver is unremarkable. GALLBLADDER AND BILE DUCTS: Gallbladder is unremarkable. No biliary ductal dilatation. SPLEEN: No acute abnormality. PANCREAS: No acute abnormality. ADRENAL GLANDS: No acute abnormality. KIDNEYS, URETERS AND BLADDER: Bilateral renal cortical scarring. No stones in the kidneys or ureters. No hydronephrosis. No perinephric or periureteral stranding. Urinary bladder is unremarkable. GI AND BOWEL: Stomach demonstrates no acute abnormality. No small or large bowel thickening or dilatation. The appendix is unremarkable. Colonic diverticulosis. There is no bowel obstruction. PERITONEUM AND RETROPERITONEUM: No ascites. No free air. No abdominal or inguinal hernia. VASCULATURE: Aorta is normal in caliber. Moderate atherosclerotic plaque. LYMPH NODES: No lymphadenopathy. REPRODUCTIVE ORGANS: The prostate is unremarkable. BONES AND SOFT TISSUES: Small retrolisthesis of L5 on S1. Intervertebral disc space vacuum phenomenon at the L5-S1 level. No acute osseous abnormality. No focal soft tissue abnormality. IMPRESSION: 1. No acute findings in the abdomen or pelvis. 2. Please see separately dictated CT angiography of the chest 12:17:2025. Electronically signed by: Morgane Naveau MD 03/04/2024 09:15 PM EST RP Workstation: [id number]   DG Chest 2 View Result Date: 03/04/2024 EXAM: 2 VIEW(S) XRAY OF THE CHEST 03/04/2024 06:44:00 PM COMPARISON: 08/03/2023 CLINICAL HISTORY: chest pain, shortness of breath FINDINGS: LUNGS AND PLEURA: Trace superimposed pulmonary edema. No pleural effusion. No pneumothorax. HEART AND MEDIASTINUM: Stable cardiomegaly. Stable hilar enlargement likely reflecting pulmonary arterial enlargement in the setting of Pulmonary Arterial Hypertension. BONES AND SOFT TISSUES: No acute osseous abnormality. IMPRESSION: 1. Trace superimposed pulmonary edema, new from  prior examination . 2. Stable cardiomegaly and hilar enlargement, likely reflecting pulmonary arterial enlargement in the setting of pulmonary arterial hypertension. Electronically signed by: Dorethia Molt MD 03/04/2024 06:48 PM EST RP Workstation: [id number]        Scheduled Meds:  amiodarone   400 mg Oral BID   apixaban   5 mg Oral BID   atorvastatin   80 mg Oral q1800   Chlorhexidine  Gluconate Cloth  6 each Topical Daily   dapagliflozin  propanediol  10 mg Oral Daily   digoxin   0.125 mg Oral Daily   ezetimibe   10 mg Oral Daily   furosemide   80 mg Intravenous TID   mexiletine  300 mg Oral BID   pantoprazole   40 mg Oral Daily   [START ON 03/07/2024] polyethylene glycol  17 g Oral Daily   potassium chloride   40 mEq Oral BID   sacubitril -valsartan   1 tablet Oral BID   senna-docusate  1 tablet Oral QHS   sodium chloride  flush  10-40 mL Intracatheter Q12H   spironolactone   25 mg Oral Daily   Continuous Infusions:  milrinone  0.25 mcg/kg/min (03/06/24 1445)     LOS: 0 days    Time spent: 40 minutes    Toribio Hummer, MD [facility]   To contact the attending provider between 7A-7P or the covering provider during after hours 7P-7A, please log into the web site [email or web address] and access using universal [place] password for that web site. If you do not have the password, please call the hospital operator.  03/06/2024, 5:24 PM    "

## 2024-03-06 NOTE — Progress Notes (Signed)
 [facility] night cross cover note:   I was notified by the patient's RN that the patient is complaining of muscle cramps in the bilateral lower extremities, abdomen, back, chest.  He is admitted for acute on chronic systolic heart failure, undergoing IV diuresis with Lasix .  Most recent potassium level was noted to be 3.6 on the morning of 03/06/2024, and the patient has recently completed potassium supplementation.  Most recent magnesium  level was found to be 2.4 when checked at 1700 today, and most recent lactic acid level was 1.1.  He has orders for repeat CMP and magnesium  level with tomorrow morning's labs.  I subsequently ordered an updated potassium level to be checked now additionally, I added as needed Robaxin for muscle cramps/muscle spasms, and increased the dose of his prn acetaminophen  1 g p.o. every 6 hours as needed for pain.     Eva Pore, DO Hospitalist

## 2024-03-07 DIAGNOSIS — I2510 Atherosclerotic heart disease of native coronary artery without angina pectoris: Secondary | ICD-10-CM | POA: Diagnosis not present

## 2024-03-07 DIAGNOSIS — Z9861 Coronary angioplasty status: Secondary | ICD-10-CM | POA: Diagnosis not present

## 2024-03-07 DIAGNOSIS — I10 Essential (primary) hypertension: Secondary | ICD-10-CM | POA: Diagnosis not present

## 2024-03-07 DIAGNOSIS — I420 Dilated cardiomyopathy: Secondary | ICD-10-CM | POA: Diagnosis not present

## 2024-03-07 DIAGNOSIS — E785 Hyperlipidemia, unspecified: Secondary | ICD-10-CM | POA: Diagnosis not present

## 2024-03-07 DIAGNOSIS — R7989 Other specified abnormal findings of blood chemistry: Secondary | ICD-10-CM | POA: Diagnosis not present

## 2024-03-07 DIAGNOSIS — I5023 Acute on chronic systolic (congestive) heart failure: Secondary | ICD-10-CM | POA: Diagnosis not present

## 2024-03-07 DIAGNOSIS — I2694 Multiple subsegmental pulmonary emboli without acute cor pulmonale: Secondary | ICD-10-CM | POA: Diagnosis not present

## 2024-03-07 DIAGNOSIS — R1084 Generalized abdominal pain: Secondary | ICD-10-CM | POA: Diagnosis not present

## 2024-03-07 DIAGNOSIS — R109 Unspecified abdominal pain: Secondary | ICD-10-CM | POA: Diagnosis not present

## 2024-03-07 LAB — CBC
HCT: 45.3 % (ref 39.0–52.0)
Hemoglobin: 15.6 g/dL (ref 13.0–17.0)
MCH: 31.6 pg (ref 26.0–34.0)
MCHC: 34.4 g/dL (ref 30.0–36.0)
MCV: 91.9 fL (ref 80.0–100.0)
Platelets: 270 K/uL (ref 150–400)
RBC: 4.93 MIL/uL (ref 4.22–5.81)
RDW: 13.6 % (ref 11.5–15.5)
WBC: 9.5 K/uL (ref 4.0–10.5)
nRBC: 0 % (ref 0.0–0.2)

## 2024-03-07 LAB — COMPREHENSIVE METABOLIC PANEL WITH GFR
ALT: 26 U/L (ref 0–44)
AST: 27 U/L (ref 15–41)
Albumin: 4.3 g/dL (ref 3.5–5.0)
Alkaline Phosphatase: 81 U/L (ref 38–126)
Anion gap: 11 (ref 5–15)
BUN: 14 mg/dL (ref 6–20)
CO2: 28 mmol/L (ref 22–32)
Calcium: 8.6 mg/dL — ABNORMAL LOW (ref 8.9–10.3)
Chloride: 95 mmol/L — ABNORMAL LOW (ref 98–111)
Creatinine, Ser: 1.22 mg/dL (ref 0.61–1.24)
GFR, Estimated: >60 mL/min
Glucose, Bld: 174 mg/dL — ABNORMAL HIGH (ref 70–99)
Potassium: 4 mmol/L (ref 3.5–5.1)
Sodium: 134 mmol/L — ABNORMAL LOW (ref 135–145)
Total Bilirubin: 3.2 mg/dL — ABNORMAL HIGH (ref 0.0–1.2)
Total Protein: 7.6 g/dL (ref 6.5–8.1)

## 2024-03-07 LAB — COOXEMETRY PANEL
Carboxyhemoglobin: 1 % (ref 0.5–1.5)
Carboxyhemoglobin: 1.1 % (ref 0.5–1.5)
Methemoglobin: <0.7 % (ref 0.0–1.5)
Methemoglobin: 0.7 % (ref 0.0–1.5)
O2 Saturation: 44.3 %
O2 Saturation: 54.5 %
Total hemoglobin: 15.9 g/dL (ref 12.0–16.0)
Total hemoglobin: 16.1 g/dL — ABNORMAL HIGH (ref 12.0–16.0)

## 2024-03-07 LAB — MAGNESIUM: Magnesium: 2.3 mg/dL (ref 1.7–2.4)

## 2024-03-07 NOTE — Progress Notes (Signed)
 " [name]  FMW:[id number] DOB: 17-Dec-1980 DOA: 03/04/2024 PCP: [name], MD    Chief Complaint  Patient presents with   Chest Pain   Shortness of Breath   Dehydration   Abdominal Pain    Brief Narrative:  Patient 43 year old gentleman history of CAD, hypertension, hyperlipidemia, morbid obesity, history of PE on Eliquis , constipation, medication nonadherence due to financial constraints presented to the ED with abdominal pain, constipation, shortness of breath.  Patient noted on admission to have elevated proBNP, admitted for acute CHF exacerbation and constipation.  Patient placed on IV Lasix .  Cardiology consulted.   Assessment & Plan:   Principal Problem:   Acute on chronic systolic heart failure (HCC) Active Problems:   Dilated cardiomyopathy (HCC)   Elevated troponin   Pulmonary emboli (HCC)   Morbid obesity (HCC)   Constipation   Essential hypertension   Hyperlipidemia LDL goal <70   CAD S/P percutaneous coronary angioplasty: DES PCI -Cx-OM1, RCA x 2   Generalized abdominal pain  #1 acute on chronic biventricular systolic heart failure/dilated cardiomyopathy - Patient presented with shortness of breath, abdominal discomfort, medication nonadherence in the past per patient secondary to financial constraints (due to not having insurance). - Repeat 2D echo with EF <20%, severely decreased left ventricular function, left ventricular global hypokinesis, grade 2 DD, severely reduced right ventricular systolic function, normal pulmonary artery systolic pressure, severely dilated left atrial size, mildly dilated right atrial size, trivial MVR. - proBNP elevated at 3698. - Patient initially placed on Lasix  40 mg IV every 12 hours and dose increased to 80 mg IV every 12 hours with urine output of 2.5 L over the past 24 hours.   - Patient is -3.174 L during this hospitalization.   - Current weight of 272.4 pounds from 277.89 pounds on admission.  -  Due to patient's dilated cardiomyopathy, complicated cardiac history cardiology consulted and patient seen by the advanced heart failure team. - Cardiology recommending PICC placement to check Co ox. - PICC line placed 03/06/2024.. - Patient currently on milrinone  and Lasix  dose increased to 80 mg IV every 12 hours. -Patient on digoxin , Entresto , spironolactone . - Cardiology recommending RHC next week. -Per cardiology.  2.  NSVT/PVCs -Patient noted to have runs of NSVT on telemetry and noted to have multiple PVCs. - Continue amiodarone  200 mg daily and Mexitil  300 mg twice daily per cardiology. - Keep potassium approximately 4, magnesium  approximately 2.  3.  Abdominal pain/abdominal discomfort/constipation ruled out - Likely secondary to low cardiac output.   - Initially felt secondary to constipation. -CT abdomen and pelvis with no acute abnormalities noted. - Patient however stated he cleansed his bowels prior to admission. -Patient with clinical improvement. -Continue treatment as in problem #1. - Follow.  4.  Elevated bilirubin - Felt likely secondary to low cardiac output - Bilirubin levels improving with diuresis.   -Follow.  5.  History of PE/DVT - Eliquis .    6.  Elevated troponin/CAD -Likely secondary to demand ischemia as troponin levels have plateaued. - Patient denies any chest pain. -Patient with history of CAD with inferolateral STEMI 9/15 with DES to left circumflex into OM1 and DES to RCA.  Patient with family history of premature CAD. -Patient with LHC 7/23 that showed diffuse CAD but no severe stenosis noted. -Cardiology following.  7.  Hyperlipidemia - Continue atorvastatin  80 mg daily, ezetimibe  10 mg daily.  8.  Hypertension - Continue spironolactone . - Patient on IV Lasix .  9.  Morbid obesity -Lifestyle modification. - Outpatient follow-up with PCP.   DVT prophylaxis: Eliquis  Code Status: Full Family Communication: Updated patient.  No family  at bedside. Disposition: Likely home when clinically improved and cleared by cardiology.  Status is: Inpatient Remains inpatient appropriate because: Severity of illness   Consultants:  Cardiology/advanced heart failure team: Dr. Zenaida 05/08/2023  Procedures:  CT angiogram chest 03/04/2024 CT abdomen and pelvis 03/04/2024 2D echo 05/07/2023 PICC line placement 03/06/2024  Antimicrobials:  Anti-infectives (From admission, onward)    None         Subjective: Patient sitting up in chair.  Patient denies any chest pain.  States significant improvement with shortness of breath since admission.  States abdominal pain/discomfort has improved significantly.  Patient states has had good urine output.  Tolerating current diet.  Denies any constipation.   Objective: Vitals:   03/07/24 0430 03/07/24 0500 03/07/24 0831 03/07/24 1127  BP: 90/71  (!) 95/53 (!) 118/92  Pulse: 82  88 100  Resp: 18  18 20   Temp: 97.8 F (36.6 C)  98 F (36.7 C) (!) 97.5 F (36.4 C)  TempSrc: Oral  Oral Oral  SpO2: 96%  90% 100%  Weight:  123.6 kg      Intake/Output Summary (Last 24 hours) at 03/07/2024 1325 Last data filed at 03/07/2024 0600 Gross per 24 hour  Intake 846.48 ml  Output 1000 ml  Net -153.52 ml   Filed Weights   03/04/24 2118 03/06/24 0348 03/07/24 0500  Weight: 126.1 kg 125.8 kg 123.6 kg    Examination:  General exam: Appears calm and comfortable  Respiratory system: Some decreased breath sounds in the bases.  Some bibasilar crackles.  1-2+ bilateral lower extremity edema.  Cardiovascular system: RRR no murmurs rubs or gallops.  No JVD.  1-2+ bilateral lower extremity edema.  Gastrointestinal system: Abdomen is soft, nontender, nondistended, positive bowel sounds.  No rebound.  No guarding.  Central nervous system: Alert and oriented. No focal neurological deficits. Extremities: 1-2+ bilateral lower extremity edema.  Skin: No rashes, lesions or ulcers Psychiatry: Judgement  and insight appear normal. Mood & affect appropriate.     Data Reviewed: I have personally reviewed following labs and imaging studies  CBC: Recent Labs  Lab 03/04/24 1833 03/06/24 0328 03/07/24 0500  WBC 10.6* 9.9 9.5  HGB 15.4 15.1 15.6  HCT 45.5 44.4 45.3  MCV 92.1 93.9 91.9  PLT 271 264 270    Basic Metabolic Panel: Recent Labs  Lab 03/04/24 1915 03/06/24 0328 03/06/24 1549 03/06/24 1700 03/06/24 2257 03/07/24 0500  NA 141 136  --   --   --  134*  K 4.6 3.6  --   --  4.2 4.0  CL 101 97*  --   --   --  95*  CO2 29 27  --   --   --  28  GLUCOSE 111* 132*  --   --   --  174*  BUN 13 17  --   --   --  14  CREATININE 1.20 1.25*  --   --   --  1.22  CALCIUM  9.9 9.1  --   --   --  8.6*  MG  --   --  2.3 2.4  --  2.3    GFR: CrCl cannot be calculated (Unknown ideal weight.).  Liver Function Tests: Recent Labs  Lab 03/04/24 1915 03/07/24 0500  AST 24 27  ALT 21 26  ALKPHOS 82 81  BILITOT 4.1* 3.2*  PROT 8.3* 7.6  ALBUMIN 4.7 4.3    CBG: No results for input(s): GLUCAP in the last 168 hours.   Recent Results (from the past 240 hours)  Resp panel by RT-PCR (RSV, Flu A&B, Covid) Anterior Nasal Swab     Status: None   Collection Time: 03/04/24  6:33 PM   Specimen: Anterior Nasal Swab  Result Value Ref Range Status   SARS Coronavirus 2 by RT PCR NEGATIVE NEGATIVE Final    Comment: (NOTE) SARS-CoV-2 target nucleic acids are NOT DETECTED.  The SARS-CoV-2 RNA is generally detectable in upper respiratory specimens during the acute phase of infection. The lowest concentration of SARS-CoV-2 viral copies this assay can detect is 138 copies/mL. A negative result does not preclude SARS-Cov-2 infection and should not be used as the sole basis for treatment or other patient management decisions. A negative result may occur with  improper specimen collection/handling, submission of specimen other than nasopharyngeal swab, presence of viral mutation(s) within  the areas targeted by this assay, and inadequate number of viral copies(<138 copies/mL). A negative result must be combined with clinical observations, patient history, and epidemiological information. The expected result is Negative.  Fact Sheet for Patients:  bloggercourse.com  Fact Sheet for Healthcare Providers:  seriousbroker.it  This test is no t yet approved or cleared by the [place]  FDA and  has been authorized for detection and/or diagnosis of SARS-CoV-2 by FDA under an Emergency Use Authorization (EUA). This EUA will remain  in effect (meaning this test can be used) for the duration of the COVID-19 declaration under Section 564(b)(1) of the Act, 21 U.S.C.section 360bbb-3(b)(1), unless the authorization is terminated  or revoked sooner.       Influenza A by PCR NEGATIVE NEGATIVE Final   Influenza B by PCR NEGATIVE NEGATIVE Final    Comment: (NOTE) The Xpert Xpress SARS-CoV-2/FLU/RSV plus assay is intended as an aid in the diagnosis of influenza from Nasopharyngeal swab specimens and should not be used as a sole basis for treatment. Nasal washings and aspirates are unacceptable for Xpert Xpress SARS-CoV-2/FLU/RSV testing.  Fact Sheet for Patients: bloggercourse.com  Fact Sheet for Healthcare Providers: seriousbroker.it  This test is not yet approved or cleared by the [place]  FDA and has been authorized for detection and/or diagnosis of SARS-CoV-2 by FDA under an Emergency Use Authorization (EUA). This EUA will remain in effect (meaning this test can be used) for the duration of the COVID-19 declaration under Section 564(b)(1) of the Act, 21 U.S.C. section 360bbb-3(b)(1), unless the authorization is terminated or revoked.     Resp Syncytial Virus by PCR NEGATIVE NEGATIVE Final    Comment: (NOTE) Fact Sheet for  Patients: bloggercourse.com  Fact Sheet for Healthcare Providers: seriousbroker.it  This test is not yet approved or cleared by the [place]  FDA and has been authorized for detection and/or diagnosis of SARS-CoV-2 by FDA under an Emergency Use Authorization (EUA). This EUA will remain in effect (meaning this test can be used) for the duration of the COVID-19 declaration under Section 564(b)(1) of the Act, 21 U.S.C. section 360bbb-3(b)(1), unless the authorization is terminated or revoked.  Performed at [facility], [address] [place], [place] [address]          Radiology Studies: US  EKG SITE RITE Result Date: 03/06/2024 If Site Rite image not attached, placement could not be confirmed due to current cardiac rhythm.  ECHOCARDIOGRAM COMPLETE Result Date: 03/05/2024    ECHOCARDIOGRAM REPORT  Patient Name:   RODMAN RECUPERO Date of Exam: 03/05/2024 Medical Rec #:  [id number]      Height:       74.0 in Accession #:    [id number]     Weight:       277.9 lb Date of Birth:  [birth year]      BSA:          2.500 m Patient Age:    43 years       BP:           151/106 mmHg Patient Gender: M              HR:           94 bpm. Exam Location:  Inpatient Procedure: 2D Echo, Cardiac Doppler, Color Doppler and Intracardiac            Opacification Agent (Both Spectral and Color Flow Doppler were            utilized during procedure). Indications:    Dyspnea                 Elevated Troponin  History:        Patient has prior history of Echocardiogram examinations, most                 recent 08/04/2023. CHF and Cardiomyopathy, CAD and Previous                 Myocardial Infarction; Risk Factors:Hypertension, Dyslipidemia                 and Former Smoker.  Sonographer:    Juliene Rucks Referring Phys: [id number] AMY N COX  Sonographer Comments: Patient is obese. IMPRESSIONS  1. Left ventricular ejection fraction, by estimation, is  <20%. The left ventricle has severely decreased function. The left ventricle demonstrates global hypokinesis. The left ventricular internal cavity size was moderately dilated. Left ventricular diastolic parameters are consistent with Grade II diastolic dysfunction (pseudonormalization).  2. Right ventricular systolic function is severely reduced. The right ventricular size is normal. There is normal pulmonary artery systolic pressure.  3. Left atrial size was severely dilated.  4. Right atrial size was mildly dilated.  5. The mitral valve is normal in structure. Trivial mitral valve regurgitation. No evidence of mitral stenosis.  6. The aortic valve is tricuspid. Aortic valve regurgitation is not visualized. No aortic stenosis is present.  7. The inferior vena cava is dilated in size with >50% respiratory variability, suggesting right atrial pressure of 8 mmHg. FINDINGS  Left Ventricle: Left ventricular ejection fraction, by estimation, is <20%. The left ventricle has severely decreased function. The left ventricle demonstrates global hypokinesis. Definity  contrast agent was given IV to delineate the left ventricular endocardial borders. The left ventricular internal cavity size was moderately dilated. There is no left ventricular hypertrophy. Left ventricular diastolic parameters are consistent with Grade II diastolic dysfunction (pseudonormalization). Right Ventricle: The right ventricular size is normal. No increase in right ventricular wall thickness. Right ventricular systolic function is severely reduced. There is normal pulmonary artery systolic pressure. The tricuspid regurgitant velocity is 1.47 m/s, and with an assumed right atrial pressure of 8 mmHg, the estimated right ventricular systolic pressure is 16.6 mmHg. Left Atrium: Left atrial size was severely dilated. Right Atrium: Right atrial size was mildly dilated. Pericardium: Trivial pericardial effusion is present. Mitral Valve: The mitral valve is  normal in structure. Trivial mitral valve regurgitation. No evidence of mitral valve stenosis. Tricuspid  Valve: The tricuspid valve is normal in structure. Tricuspid valve regurgitation is trivial. No evidence of tricuspid stenosis. Aortic Valve: The aortic valve is tricuspid. Aortic valve regurgitation is not visualized. No aortic stenosis is present. Pulmonic Valve: The pulmonic valve was normal in structure. Pulmonic valve regurgitation is not visualized. No evidence of pulmonic stenosis. Aorta: The aortic root is normal in size and structure. Venous: The inferior vena cava is dilated in size with greater than 50% respiratory variability, suggesting right atrial pressure of 8 mmHg. IAS/Shunts: No atrial level shunt detected by color flow Doppler.  LEFT VENTRICLE PLAX 2D LVIDd:         6.50 cm   Diastology LVIDs:         5.90 cm   LV e' medial:  6.45 cm/s LV PW:         1.00 cm   LV e' lateral: 6.68 cm/s LV IVS:        1.00 cm LVOT diam:     2.00 cm LVOT Area:     3.14 cm  RIGHT VENTRICLE            IVC RV Basal diam:  3.30 cm    IVC diam: 2.30 cm RV Mid diam:    3.10 cm RV S prime:     9.75 cm/s TAPSE (M-mode): 1.2 cm LEFT ATRIUM            Index        RIGHT ATRIUM           Index LA diam:      5.90 cm  2.36 cm/m   RA Area:     25.20 cm LA Vol (A2C): 114.0 ml 45.61 ml/m  RA Volume:   83.00 ml  33.21 ml/m   AORTA Ao Root diam: 3.00 cm Ao Asc diam:  3.20 cm TRICUSPID VALVE TR Peak grad:   8.6 mmHg TR Vmax:        147.00 cm/s  SHUNTS Systemic Diam: 2.00 cm Annabella Scarce MD Electronically signed by Annabella Scarce MD Signature Date/Time: 03/05/2024/6:08:00 PM    Final         Scheduled Meds:  amiodarone   400 mg Oral BID   apixaban   5 mg Oral BID   atorvastatin   80 mg Oral q1800   Chlorhexidine  Gluconate Cloth  6 each Topical Daily   dapagliflozin  propanediol  10 mg Oral Daily   digoxin   0.125 mg Oral Daily   ezetimibe   10 mg Oral Daily   furosemide   80 mg Intravenous TID   mexiletine  300 mg  Oral BID   pantoprazole   40 mg Oral Daily   potassium chloride   40 mEq Oral BID   sacubitril -valsartan   1 tablet Oral BID   sodium chloride  flush  10-40 mL Intracatheter Q12H   spironolactone   25 mg Oral Daily   Continuous Infusions:  milrinone  0.25 mcg/kg/min (03/07/24 1125)     LOS: 1 day    Time spent: 40 minutes    Toribio Hummer, MD [facility]   To contact the attending provider between 7A-7P or the covering provider during after hours 7P-7A, please log into the web site [email or web address] and access using universal Lucas password for that web site. If you do not have the password, please call the hospital operator.  03/07/2024, 1:25 PM    "

## 2024-03-07 NOTE — Progress Notes (Signed)
 "   Advanced Heart Failure Rounding Note   Subjective:    Started on milrinone  last night for low output. Now on milrinone  0.25  Early am co-ox 60% -> 44%  Resting comfortably. Good diuresis. Weight down 5 pounds     Objective:   Weight Range:  Vital Signs:   Temp:  [97.5 F (36.4 C)-98 F (36.7 C)] 97.5 F (36.4 C) (12/20 1127) Pulse Rate:  [50-100] 100 (12/20 1127) Resp:  [18-20] 20 (12/20 1127) BP: (90-118)/(53-92) 118/92 (12/20 1127) SpO2:  [85 %-100 %] 100 % (12/20 1127) Weight:  [123.6 kg] 123.6 kg (12/20 0500) Last BM Date : 03/06/24  Weight change: Filed Weights   03/04/24 2118 03/06/24 0348 03/07/24 0500  Weight: 126.1 kg 125.8 kg 123.6 kg    Intake/Output:   Intake/Output Summary (Last 24 hours) at 03/07/2024 1525 Last data filed at 03/07/2024 1125 Gross per 24 hour  Intake 1150.35 ml  Output 1000 ml  Net 150.35 ml     Physical Exam: General:  Sitting up in chair. No resp difficulty HEENT: normal Neck: supple. JVP to ear Cor: Regular tachy. No rubs, gallops or murmurs. Lungs: clear Abdomen: obese  soft, nontender, nondistended.Good bowel sounds. Extremities: no cyanosis, clubbing, rash, 2+ edema Neuro: alert & orientedx3, cranial nerves grossly intact. moves all 4 extremities w/o difficulty. Affect pleasant  Telemetry: sinus ~100 Personally reviewed   Labs: Basic Metabolic Panel: Recent Labs  Lab 03/04/24 1915 03/06/24 0328 03/06/24 1549 03/06/24 1700 03/06/24 2257 03/07/24 0500  NA 141 136  --   --   --  134*  K 4.6 3.6  --   --  4.2 4.0  CL 101 97*  --   --   --  95*  CO2 29 27  --   --   --  28  GLUCOSE 111* 132*  --   --   --  174*  BUN 13 17  --   --   --  14  CREATININE 1.20 1.25*  --   --   --  1.22  CALCIUM  9.9 9.1  --   --   --  8.6*  MG  --   --  2.3 2.4  --  2.3    Liver Function Tests: Recent Labs  Lab 03/04/24 1915 03/07/24 0500  AST 24 27  ALT 21 26  ALKPHOS 82 81  BILITOT 4.1* 3.2*  PROT 8.3* 7.6   ALBUMIN 4.7 4.3   Recent Labs  Lab 03/04/24 1915  LIPASE 23   No results for input(s): AMMONIA in the last 168 hours.  CBC: Recent Labs  Lab 03/04/24 1833 03/06/24 0328 03/07/24 0500  WBC 10.6* 9.9 9.5  HGB 15.4 15.1 15.6  HCT 45.5 44.4 45.3  MCV 92.1 93.9 91.9  PLT 271 264 270    Cardiac Enzymes: No results for input(s): CKTOTAL, CKMB, CKMBINDEX, TROPONINI in the last 168 hours.  BNP: BNP (last 3 results) Recent Labs    08/03/23 1028  BNP 892.8*    ProBNP (last 3 results) Recent Labs    03/04/24 1833  PROBNP 3,698.0*      Other results:  Imaging: US  EKG SITE RITE Result Date: 03/06/2024 If Site Rite image not attached, placement could not be confirmed due to current cardiac rhythm.  ECHOCARDIOGRAM COMPLETE Result Date: 03/05/2024    ECHOCARDIOGRAM REPORT   Patient Name:   Reginald Fernandez Date of Exam: 03/05/2024 Medical Rec #:  [id number]      Height:  74.0 in Accession #:    [id number]     Weight:       277.9 lb Date of Birth:  [birth year]      BSA:          2.500 m Patient Age:    43 years       BP:           151/106 mmHg Patient Gender: M              HR:           94 bpm. Exam Location:  Inpatient Procedure: 2D Echo, Cardiac Doppler, Color Doppler and Intracardiac            Opacification Agent (Both Spectral and Color Flow Doppler were            utilized during procedure). Indications:    Dyspnea                 Elevated Troponin  History:        Patient has prior history of Echocardiogram examinations, most                 recent 08/04/2023. CHF and Cardiomyopathy, CAD and Previous                 Myocardial Infarction; Risk Factors:Hypertension, Dyslipidemia                 and Former Smoker.  Sonographer:    Juliene Rucks Referring Phys: [id number] AMY N COX  Sonographer Comments: Patient is obese. IMPRESSIONS  1. Left ventricular ejection fraction, by estimation, is <20%. The left ventricle has severely decreased function. The left ventricle  demonstrates global hypokinesis. The left ventricular internal cavity size was moderately dilated. Left ventricular diastolic parameters are consistent with Grade II diastolic dysfunction (pseudonormalization).  2. Right ventricular systolic function is severely reduced. The right ventricular size is normal. There is normal pulmonary artery systolic pressure.  3. Left atrial size was severely dilated.  4. Right atrial size was mildly dilated.  5. The mitral valve is normal in structure. Trivial mitral valve regurgitation. No evidence of mitral stenosis.  6. The aortic valve is tricuspid. Aortic valve regurgitation is not visualized. No aortic stenosis is present.  7. The inferior vena cava is dilated in size with >50% respiratory variability, suggesting right atrial pressure of 8 mmHg. FINDINGS  Left Ventricle: Left ventricular ejection fraction, by estimation, is <20%. The left ventricle has severely decreased function. The left ventricle demonstrates global hypokinesis. Definity  contrast agent was given IV to delineate the left ventricular endocardial borders. The left ventricular internal cavity size was moderately dilated. There is no left ventricular hypertrophy. Left ventricular diastolic parameters are consistent with Grade II diastolic dysfunction (pseudonormalization). Right Ventricle: The right ventricular size is normal. No increase in right ventricular wall thickness. Right ventricular systolic function is severely reduced. There is normal pulmonary artery systolic pressure. The tricuspid regurgitant velocity is 1.47 m/s, and with an assumed right atrial pressure of 8 mmHg, the estimated right ventricular systolic pressure is 16.6 mmHg. Left Atrium: Left atrial size was severely dilated. Right Atrium: Right atrial size was mildly dilated. Pericardium: Trivial pericardial effusion is present. Mitral Valve: The mitral valve is normal in structure. Trivial mitral valve regurgitation. No evidence of mitral  valve stenosis. Tricuspid Valve: The tricuspid valve is normal in structure. Tricuspid valve regurgitation is trivial. No evidence of tricuspid stenosis. Aortic Valve: The aortic valve is tricuspid. Aortic valve regurgitation  is not visualized. No aortic stenosis is present. Pulmonic Valve: The pulmonic valve was normal in structure. Pulmonic valve regurgitation is not visualized. No evidence of pulmonic stenosis. Aorta: The aortic root is normal in size and structure. Venous: The inferior vena cava is dilated in size with greater than 50% respiratory variability, suggesting right atrial pressure of 8 mmHg. IAS/Shunts: No atrial level shunt detected by color flow Doppler.  LEFT VENTRICLE PLAX 2D LVIDd:         6.50 cm   Diastology LVIDs:         5.90 cm   LV e' medial:  6.45 cm/s LV PW:         1.00 cm   LV e' lateral: 6.68 cm/s LV IVS:        1.00 cm LVOT diam:     2.00 cm LVOT Area:     3.14 cm  RIGHT VENTRICLE            IVC RV Basal diam:  3.30 cm    IVC diam: 2.30 cm RV Mid diam:    3.10 cm RV S prime:     9.75 cm/s TAPSE (M-mode): 1.2 cm LEFT ATRIUM            Index        RIGHT ATRIUM           Index LA diam:      5.90 cm  2.36 cm/m   RA Area:     25.20 cm LA Vol (A2C): 114.0 ml 45.61 ml/m  RA Volume:   83.00 ml  33.21 ml/m   AORTA Ao Root diam: 3.00 cm Ao Asc diam:  3.20 cm TRICUSPID VALVE TR Peak grad:   8.6 mmHg TR Vmax:        147.00 cm/s  SHUNTS Systemic Diam: 2.00 cm Annabella Scarce MD Electronically signed by Annabella Scarce MD Signature Date/Time: 03/05/2024/6:08:00 PM    Final      Medications:     Scheduled Medications:  amiodarone   400 mg Oral BID   apixaban   5 mg Oral BID   atorvastatin   80 mg Oral q1800   Chlorhexidine  Gluconate Cloth  6 each Topical Daily   dapagliflozin  propanediol  10 mg Oral Daily   digoxin   0.125 mg Oral Daily   ezetimibe   10 mg Oral Daily   furosemide   80 mg Intravenous TID   mexiletine  300 mg Oral BID   pantoprazole   40 mg Oral Daily   potassium  chloride  40 mEq Oral BID   sacubitril -valsartan   1 tablet Oral BID   sodium chloride  flush  10-40 mL Intracatheter Q12H   spironolactone   25 mg Oral Daily    Infusions:  milrinone  0.25 mcg/kg/min (03/07/24 1125)    PRN Medications: acetaminophen  **OR** acetaminophen , HYDROmorphone  (DILAUDID ) injection, methocarbamol , naLOXone  (NARCAN )  injection, nitroGLYCERIN , ondansetron  **OR** ondansetron  (ZOFRAN ) IV, polyethylene glycol, senna-docusate, sodium chloride  flush   Assessment/Plan:   #A/C Biventricular HFrEF, 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 Followed in the HF clinic with previous discussions about advanced therapies. We are still concerned he may need advanced therapies. Suspect abdominal issues are related to low output HF.  Check Blood Type.  NYHA Stage D. - On milrinone  0.25. Early am co-ox down. Will repeat. Adjust milrinone  as needed - Continue IV lasix  - Continue digoxin  0.125 mg daily  -Entresto  24-26 mg twice a day. - Continue Spiro 25 mg daily   - Will need RHC next  week.    #NSVT/PVCs   - High PVC burden.  -Continue amio 200 mg daily + mexitil  300 mg twice a day.  -Follow K and Mag. Supp K  -TSH ok   # Abdominal Pain -CT negative. Suspect this is related to low output heart failure.    # Elevated TBili -Suspect in the setting of low output HF. Follow daily CMET   # CAD  Strong family history of premature CAD.  H/o inferolateral STEMI in 9/15 with DES to LCx into OM1 and DES to RCA.  LHC this admit 7/23 demonstrated diffuse CAD but no severe stenosis to explain worsening of his EF.   No s/s angina  Continue atorvastatin  and zetia    # H/O PE/DVT Continue eliquis . No bleeding      Length of Stay: 1   Adalei Novell 03/07/2024, 3:25 PM  Advanced Heart Failure Team Pager [phone] (M-F; 7a - 4p)  Please contact [facility] for night-coverage after hours (4p -7a ) and weekends on [email or web address]  "

## 2024-03-08 ENCOUNTER — Other Ambulatory Visit: Payer: Self-pay

## 2024-03-08 DIAGNOSIS — I420 Dilated cardiomyopathy: Secondary | ICD-10-CM | POA: Diagnosis not present

## 2024-03-08 DIAGNOSIS — I10 Essential (primary) hypertension: Secondary | ICD-10-CM | POA: Diagnosis not present

## 2024-03-08 DIAGNOSIS — I5023 Acute on chronic systolic (congestive) heart failure: Secondary | ICD-10-CM | POA: Diagnosis not present

## 2024-03-08 DIAGNOSIS — I2694 Multiple subsegmental pulmonary emboli without acute cor pulmonale: Secondary | ICD-10-CM | POA: Diagnosis not present

## 2024-03-08 LAB — COMPREHENSIVE METABOLIC PANEL WITH GFR
ALT: 26 U/L (ref 0–44)
AST: 27 U/L (ref 15–41)
Albumin: 4.4 g/dL (ref 3.5–5.0)
Alkaline Phosphatase: 86 U/L (ref 38–126)
Anion gap: 11 (ref 5–15)
BUN: 15 mg/dL (ref 6–20)
CO2: 28 mmol/L (ref 22–32)
Calcium: 8.9 mg/dL (ref 8.9–10.3)
Chloride: 94 mmol/L — ABNORMAL LOW (ref 98–111)
Creatinine, Ser: 1.29 mg/dL — ABNORMAL HIGH (ref 0.61–1.24)
GFR, Estimated: >60 mL/min
Glucose, Bld: 179 mg/dL — ABNORMAL HIGH (ref 70–99)
Potassium: 4.5 mmol/L (ref 3.5–5.1)
Sodium: 133 mmol/L — ABNORMAL LOW (ref 135–145)
Total Bilirubin: 2.7 mg/dL — ABNORMAL HIGH (ref 0.0–1.2)
Total Protein: 8 g/dL (ref 6.5–8.1)

## 2024-03-08 LAB — CBC
HCT: 47.1 % (ref 39.0–52.0)
Hemoglobin: 16.5 g/dL (ref 13.0–17.0)
MCH: 31.9 pg (ref 26.0–34.0)
MCHC: 35 g/dL (ref 30.0–36.0)
MCV: 90.9 fL (ref 80.0–100.0)
Platelets: 268 K/uL (ref 150–400)
RBC: 5.18 MIL/uL (ref 4.22–5.81)
RDW: 13.4 % (ref 11.5–15.5)
WBC: 9.4 K/uL (ref 4.0–10.5)
nRBC: 0 % (ref 0.0–0.2)

## 2024-03-08 LAB — COOXEMETRY PANEL
Carboxyhemoglobin: 1.4 % (ref 0.5–1.5)
Methemoglobin: <0.7 % (ref 0.0–1.5)
O2 Saturation: 58.8 %
Total hemoglobin: 16.9 g/dL — ABNORMAL HIGH (ref 12.0–16.0)

## 2024-03-08 LAB — MAGNESIUM: Magnesium: 2.3 mg/dL (ref 1.7–2.4)

## 2024-03-08 NOTE — Plan of Care (Signed)
?  Problem: Education: ?Goal: Ability to demonstrate management of disease process will improve ?Outcome: Progressing ?  ?

## 2024-03-08 NOTE — Progress Notes (Signed)
 "   Advanced Heart Failure Rounding Note   Subjective:    Started on milrinone  for low output. Now on milrinone  0.25  Early am co-ox 60% -> 44% -> 59%   Weight down 5 pounds  Feels better No sob, orthopnea or PND  Rhythm stable.   CVP line not working well. CVP ~6  Objective:   Weight Range:  Vital Signs:   Temp:  [98 F (36.7 C)-98.7 F (37.1 C)] 98.7 F (37.1 C) (12/21 1000) Pulse Rate:  [85-92] 92 (12/21 1000) Resp:  [18] 18 (12/21 1000) BP: (94-122)/(69-91) 100/77 (12/21 1000) SpO2:  [95 %-100 %] 99 % (12/21 1000) Weight:  [121.7 kg] 121.7 kg (12/21 0500) Last BM Date : 03/06/24  Weight change: Filed Weights   03/06/24 0348 03/07/24 0500 03/08/24 0500  Weight: 125.8 kg 123.6 kg 121.7 kg    Intake/Output:   Intake/Output Summary (Last 24 hours) at 03/08/2024 1308 Last data filed at 03/08/2024 1229 Gross per 24 hour  Intake 1440 ml  Output 3025 ml  Net -1585 ml     Physical Exam: General:  Sitting up in chair No resp difficulty HEENT: normal Neck: supple. no JVD.  Cor: Regular rate & rhythm. No rubs, gallops or murmurs. Lungs: clear Abdomen: soft, nontender, nondistended.Good bowel sounds. Extremities: no cyanosis, clubbing, rash, tr-1+ edema Neuro: alert & orientedx3, cranial nerves grossly intact. moves all 4 extremities w/o difficulty. Affect pleasant   Telemetry: sinus 90-100 Personally reviewed   Labs: Basic Metabolic Panel: Recent Labs  Lab 03/04/24 1915 03/06/24 0328 03/06/24 1549 03/06/24 1700 03/06/24 2257 03/07/24 0500 03/08/24 0500  NA 141 136  --   --   --  134* 133*  K 4.6 3.6  --   --  4.2 4.0 4.5  CL 101 97*  --   --   --  95* 94*  CO2 29 27  --   --   --  28 28  GLUCOSE 111* 132*  --   --   --  174* 179*  BUN 13 17  --   --   --  14 15  CREATININE 1.20 1.25*  --   --   --  1.22 1.29*  CALCIUM  9.9 9.1  --   --   --  8.6* 8.9  MG  --   --  2.3 2.4  --  2.3 2.3    Liver Function Tests: Recent Labs  Lab 03/04/24 1915  03/07/24 0500 03/08/24 0500  AST 24 27 27   ALT 21 26 26   ALKPHOS 82 81 86  BILITOT 4.1* 3.2* 2.7*  PROT 8.3* 7.6 8.0  ALBUMIN 4.7 4.3 4.4   Recent Labs  Lab 03/04/24 1915  LIPASE 23   No results for input(s): AMMONIA in the last 168 hours.  CBC: Recent Labs  Lab 03/04/24 1833 03/06/24 0328 03/07/24 0500 03/08/24 0500  WBC 10.6* 9.9 9.5 9.4  HGB 15.4 15.1 15.6 16.5  HCT 45.5 44.4 45.3 47.1  MCV 92.1 93.9 91.9 90.9  PLT 271 264 270 268    Cardiac Enzymes: No results for input(s): CKTOTAL, CKMB, CKMBINDEX, TROPONINI in the last 168 hours.  BNP: BNP (last 3 results) Recent Labs    08/03/23 1028  BNP 892.8*    ProBNP (last 3 results) Recent Labs    03/04/24 1833  PROBNP 3,698.0*      Other results:  Imaging: No results found.    Medications:     Scheduled Medications:  amiodarone   400  mg Oral BID   apixaban   5 mg Oral BID   atorvastatin   80 mg Oral q1800   Chlorhexidine  Gluconate Cloth  6 each Topical Daily   dapagliflozin  propanediol  10 mg Oral Daily   digoxin   0.125 mg Oral Daily   ezetimibe   10 mg Oral Daily   furosemide   80 mg Intravenous TID   mexiletine  300 mg Oral BID   pantoprazole   40 mg Oral Daily   potassium chloride   40 mEq Oral BID   sacubitril -valsartan   1 tablet Oral BID   sodium chloride  flush  10-40 mL Intracatheter Q12H   spironolactone   25 mg Oral Daily    Infusions:  milrinone  0.25 mcg/kg/min (03/08/24 0934)    PRN Medications: acetaminophen  **OR** acetaminophen , HYDROmorphone  (DILAUDID ) injection, methocarbamol , naLOXone  (NARCAN )  injection, nitroGLYCERIN , ondansetron  **OR** ondansetron  (ZOFRAN ) IV, polyethylene glycol, senna-docusate, sodium chloride  flush   Assessment/Plan:   #A/C Biventricular HFrEF, 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 Followed in the HF clinic with previous discussions about advanced therapies. We are still concerned he may need advanced therapies. Suspect  abdominal issues are related to low output HF.  Check Blood Type.  NYHA Stage D. - On milrinone  0.25. Co-ox improved - Diuresing well. Nearing euvolemia. Will continue IV lasix  one more day - Continue digoxin  0.125 mg daily  - Continue Entresto  24-26 mg twice a day. - Continue Spiro 25 mg daily    Continue Farxiga  10 - Will begin milrinone  wean tomorrow if co-ox ok - Long discussion about potential need for advanced therapies. Will need RHC once milrinone  weaned (or if fails)   #NSVT/PVCs   - High PVC burden.  -Continue amio 200 mg daily + mexitil  300 mg twice a day.  -Follow K and Mag. Supp K  -TSH ok   # Abdominal Pain -CT negative. Suspect this is related to low output heart failure.    # Elevated TBili -Suspect in the setting of low output HF. Follow daily CMET  T bili 4.1 -> 2.7   # CAD  - Strong family history of premature CAD.  H/o inferolateral STEMI in 9/15 with DES to LCx into OM1 and DES to RCA.  LHC this admit 7/23 demonstrated diffuse CAD but no severe stenosis to explain worsening of his EF.   - No s/s angina - Continue atorvastatin  and zetia    # H/O PE/DVT - Continue eliquis . No bleeding      Length of Stay: 2   Toribio Fuel MD 03/08/2024, 1:08 PM  Advanced Heart Failure Team Pager [phone] (M-F; 7a - 4p)  Please contact [facility] for night-coverage after hours (4p -7a ) and weekends on [email or web address]  "

## 2024-03-08 NOTE — Progress Notes (Signed)
 " [name]  FMW:[id number] DOB: 1980/08/13 DOA: 03/04/2024 PCP: [name], MD    Chief Complaint  Patient presents with   Chest Pain   Shortness of Breath   Dehydration   Abdominal Pain    Brief Narrative:  Patient 43 year old gentleman history of CAD, hypertension, hyperlipidemia, morbid obesity, history of PE on Eliquis , constipation, medication nonadherence due to financial constraints presented to the ED with abdominal pain, constipation, shortness of breath.  Patient noted on admission to have elevated proBNP, admitted for acute CHF exacerbation and constipation.  Patient placed on IV Lasix .  Cardiology consulted.   Assessment & Plan:   Principal Problem:   Acute on chronic systolic heart failure (HCC) Active Problems:   Dilated cardiomyopathy (HCC)   Elevated troponin   Pulmonary emboli (HCC)   Morbid obesity (HCC)   Constipation   Essential hypertension   Hyperlipidemia LDL goal <70   CAD S/P percutaneous coronary angioplasty: DES PCI -Cx-OM1, RCA x 2   Generalized abdominal pain  #1 acute on chronic biventricular systolic heart failure/dilated cardiomyopathy - Patient presented with shortness of breath, abdominal discomfort, medication nonadherence in the past per patient secondary to financial constraints (due to not having insurance). - Repeat 2D echo with EF <20%, severely decreased left ventricular function, left ventricular global hypokinesis, grade 2 DD, severely reduced right ventricular systolic function, normal pulmonary artery systolic pressure, severely dilated left atrial size, mildly dilated right atrial size, trivial MVR. - proBNP elevated at 3698. - Patient initially placed on Lasix  40 mg IV every 12 hours and dose increased to 80 mg IV every 12 hours with urine output of 2.1 L over the past 24 hours and patient currently with a urine output of 2.8 L today.   - Patient is -5.317 L during this hospitalization.   - Current  weight of 268.4 pounds from 272.4 pounds from 277.89 pounds on admission.  - Due to patient's dilated cardiomyopathy, complicated cardiac history cardiology consulted and patient seen by the advanced heart failure team. - Cardiology recommended PICC placement to check Co ox. - PICC line placed 03/06/2024.. - Patient currently on milrinone  and Lasix  80 mg IV 3 times daily. -Patient on digoxin , Entresto , spironolactone , Farxiga . - Cardiology recommending RHC next week. -Per cardiology.  2.  NSVT/PVCs -Patient noted to have runs of NSVT on telemetry and noted to have multiple PVCs. - Continue amiodarone  200 mg daily and Mexitil  300 mg twice daily per cardiology. - Keep potassium approximately 4, magnesium  approximately 2.  3.  Abdominal pain/abdominal discomfort/constipation ruled out - Likely secondary to low cardiac output.   - Initially felt secondary to constipation. -CT abdomen and pelvis with no acute abnormalities noted. - Patient however stated he cleansed his bowels prior to admission. - Patient with significant clinical improvement with diuresis. -Continue treatment as in problem #1. - Follow.  4.  Elevated bilirubin - Felt likely secondary to low cardiac output - Bilirubin improving with diuresis.  -Follow.  5.  History of PE/DVT - Continue Eliquis .    6.  Elevated troponin/CAD -Likely secondary to demand ischemia as troponin levels have plateaued. - Patient denies any chest pain. -Patient with history of CAD with inferolateral STEMI 9/15 with DES to left circumflex into OM1 and DES to RCA.  Patient with family history of premature CAD. -Patient with LHC 7/23 that showed diffuse CAD but no severe stenosis noted. -Cardiology following.  7.  Hyperlipidemia - Atorvastatin  80 mg daily, ezetimibe  10  mg daily.  8.  Hypertension - Controlled on spironolactone , IV Lasix .   9.  Morbid obesity -Lifestyle modification. - Outpatient follow-up with PCP.   DVT prophylaxis:  Eliquis  Code Status: Full Family Communication: Updated patient.  No family at bedside. Disposition: Likely home when clinically improved and cleared by cardiology.  Status is: Inpatient Remains inpatient appropriate because: Severity of illness   Consultants:  Cardiology/advanced heart failure team: Dr. Zenaida 05/08/2023  Procedures:  CT angiogram chest 03/04/2024 CT abdomen and pelvis 03/04/2024 2D echo 05/07/2023 PICC line placement 03/06/2024  Antimicrobials:  Anti-infectives (From admission, onward)    None         Subjective: Patient sitting up in chair.  Denies any chest pain.  Feels shortness of breath has improved.  Denies any abdominal discomfort.  States has significant urine output over the past 24 hours.   Objective: Vitals:   03/08/24 0500 03/08/24 0506 03/08/24 0709 03/08/24 0800  BP:  122/69 94/73 104/74  Pulse:   88 85  Resp:  18 18 18   Temp:  98 F (36.7 C) 98 F (36.7 C) 98.4 F (36.9 C)  TempSrc:  Oral Oral Oral  SpO2:  97% 97% 100%  Weight: 121.7 kg     Height:        Intake/Output Summary (Last 24 hours) at 03/08/2024 1108 Last data filed at 03/08/2024 0934 Gross per 24 hour  Intake 1263.87 ml  Output 2425 ml  Net -1161.13 ml   Filed Weights   03/06/24 0348 03/07/24 0500 03/08/24 0500  Weight: 125.8 kg 123.6 kg 121.7 kg    Examination:  General exam: NAD. Respiratory system: Decreased breath sounds in the bases.  Decreased bibasilar crackles.  Trace to 1+ bilateral lower extremity edema.   Cardiovascular system: RRR no murmurs rubs or gallops.  No JVD.  Trace to 1+ bilateral lower extremity edema.  Gastrointestinal system: Abdomen soft, nontender, nondistended, positive bowel sounds.  No rebound.  No guarding.  Central nervous system: Alert and oriented.  Moving extremities spontaneously no focal neurological deficits. Extremities: Trace to 1+ bilateral lower extremity edema.  Skin: No rashes, lesions or ulcers Psychiatry:  Judgement and insight appear normal. Mood & affect appropriate.     Data Reviewed: I have personally reviewed following labs and imaging studies  CBC: Recent Labs  Lab 03/04/24 1833 03/06/24 0328 03/07/24 0500 03/08/24 0500  WBC 10.6* 9.9 9.5 9.4  HGB 15.4 15.1 15.6 16.5  HCT 45.5 44.4 45.3 47.1  MCV 92.1 93.9 91.9 90.9  PLT 271 264 270 268    Basic Metabolic Panel: Recent Labs  Lab 03/04/24 1915 03/06/24 0328 03/06/24 1549 03/06/24 1700 03/06/24 2257 03/07/24 0500 03/08/24 0500  NA 141 136  --   --   --  134* 133*  K 4.6 3.6  --   --  4.2 4.0 4.5  CL 101 97*  --   --   --  95* 94*  CO2 29 27  --   --   --  28 28  GLUCOSE 111* 132*  --   --   --  174* 179*  BUN 13 17  --   --   --  14 15  CREATININE 1.20 1.25*  --   --   --  1.22 1.29*  CALCIUM  9.9 9.1  --   --   --  8.6* 8.9  MG  --   --  2.3 2.4  --  2.3 2.3    GFR: Estimated Creatinine  Clearance: 102.3 mL/min (A) (by C-G formula based on SCr of 1.29 mg/dL (H)).  Liver Function Tests: Recent Labs  Lab 03/04/24 1915 03/07/24 0500 03/08/24 0500  AST 24 27 27   ALT 21 26 26   ALKPHOS 82 81 86  BILITOT 4.1* 3.2* 2.7*  PROT 8.3* 7.6 8.0  ALBUMIN 4.7 4.3 4.4    CBG: No results for input(s): GLUCAP in the last 168 hours.   Recent Results (from the past 240 hours)  Resp panel by RT-PCR (RSV, Flu A&B, Covid) Anterior Nasal Swab     Status: None   Collection Time: 03/04/24  6:33 PM   Specimen: Anterior Nasal Swab  Result Value Ref Range Status   SARS Coronavirus 2 by RT PCR NEGATIVE NEGATIVE Final    Comment: (NOTE) SARS-CoV-2 target nucleic acids are NOT DETECTED.  The SARS-CoV-2 RNA is generally detectable in upper respiratory specimens during the acute phase of infection. The lowest concentration of SARS-CoV-2 viral copies this assay can detect is 138 copies/mL. A negative result does not preclude SARS-Cov-2 infection and should not be used as the sole basis for treatment or other patient  management decisions. A negative result may occur with  improper specimen collection/handling, submission of specimen other than nasopharyngeal swab, presence of viral mutation(s) within the areas targeted by this assay, and inadequate number of viral copies(<138 copies/mL). A negative result must be combined with clinical observations, patient history, and epidemiological information. The expected result is Negative.  Fact Sheet for Patients:  bloggercourse.com  Fact Sheet for Healthcare Providers:  seriousbroker.it  This test is no t yet approved or cleared by the [place]  FDA and  has been authorized for detection and/or diagnosis of SARS-CoV-2 by FDA under an Emergency Use Authorization (EUA). This EUA will remain  in effect (meaning this test can be used) for the duration of the COVID-19 declaration under Section 564(b)(1) of the Act, 21 U.S.C.section 360bbb-3(b)(1), unless the authorization is terminated  or revoked sooner.       Influenza A by PCR NEGATIVE NEGATIVE Final   Influenza B by PCR NEGATIVE NEGATIVE Final    Comment: (NOTE) The Xpert Xpress SARS-CoV-2/FLU/RSV plus assay is intended as an aid in the diagnosis of influenza from Nasopharyngeal swab specimens and should not be used as a sole basis for treatment. Nasal washings and aspirates are unacceptable for Xpert Xpress SARS-CoV-2/FLU/RSV testing.  Fact Sheet for Patients: bloggercourse.com  Fact Sheet for Healthcare Providers: seriousbroker.it  This test is not yet approved or cleared by the [place]  FDA and has been authorized for detection and/or diagnosis of SARS-CoV-2 by FDA under an Emergency Use Authorization (EUA). This EUA will remain in effect (meaning this test can be used) for the duration of the COVID-19 declaration under Section 564(b)(1) of the Act, 21 U.S.C. section 360bbb-3(b)(1),  unless the authorization is terminated or revoked.     Resp Syncytial Virus by PCR NEGATIVE NEGATIVE Final    Comment: (NOTE) Fact Sheet for Patients: bloggercourse.com  Fact Sheet for Healthcare Providers: seriousbroker.it  This test is not yet approved or cleared by the [place]  FDA and has been authorized for detection and/or diagnosis of SARS-CoV-2 by FDA under an Emergency Use Authorization (EUA). This EUA will remain in effect (meaning this test can be used) for the duration of the COVID-19 declaration under Section 564(b)(1) of the Act, 21 U.S.C. section 360bbb-3(b)(1), unless the authorization is terminated or revoked.  Performed at [facility], [address], [place], [place] [address]  Radiology Studies: US  EKG SITE RITE Result Date: 03/06/2024 If Site Rite image not attached, placement could not be confirmed due to current cardiac rhythm.       Scheduled Meds:  amiodarone   400 mg Oral BID   apixaban   5 mg Oral BID   atorvastatin   80 mg Oral q1800   Chlorhexidine  Gluconate Cloth  6 each Topical Daily   dapagliflozin  propanediol  10 mg Oral Daily   digoxin   0.125 mg Oral Daily   ezetimibe   10 mg Oral Daily   furosemide   80 mg Intravenous TID   mexiletine  300 mg Oral BID   pantoprazole   40 mg Oral Daily   potassium chloride   40 mEq Oral BID   sacubitril -valsartan   1 tablet Oral BID   sodium chloride  flush  10-40 mL Intracatheter Q12H   spironolactone   25 mg Oral Daily   Continuous Infusions:  milrinone  0.25 mcg/kg/min (03/08/24 0934)     LOS: 2 days    Time spent: 40 minutes    Toribio Hummer, MD [facility]   To contact the attending provider between 7A-7P or the covering provider during after hours 7P-7A, please log into the web site [email or web address] and access using universal [place] password for that web site. If you do not have the password,  please call the hospital operator.  03/08/2024, 11:08 AM    "

## 2024-03-09 ENCOUNTER — Telehealth: Payer: Self-pay | Admitting: Internal Medicine

## 2024-03-09 DIAGNOSIS — I2510 Atherosclerotic heart disease of native coronary artery without angina pectoris: Secondary | ICD-10-CM | POA: Diagnosis not present

## 2024-03-09 DIAGNOSIS — R109 Unspecified abdominal pain: Secondary | ICD-10-CM | POA: Diagnosis not present

## 2024-03-09 DIAGNOSIS — R7989 Other specified abnormal findings of blood chemistry: Secondary | ICD-10-CM | POA: Diagnosis not present

## 2024-03-09 DIAGNOSIS — I420 Dilated cardiomyopathy: Secondary | ICD-10-CM | POA: Diagnosis not present

## 2024-03-09 DIAGNOSIS — I10 Essential (primary) hypertension: Secondary | ICD-10-CM | POA: Diagnosis not present

## 2024-03-09 DIAGNOSIS — E785 Hyperlipidemia, unspecified: Secondary | ICD-10-CM | POA: Diagnosis not present

## 2024-03-09 DIAGNOSIS — R1084 Generalized abdominal pain: Secondary | ICD-10-CM | POA: Diagnosis not present

## 2024-03-09 DIAGNOSIS — I2694 Multiple subsegmental pulmonary emboli without acute cor pulmonale: Secondary | ICD-10-CM | POA: Diagnosis not present

## 2024-03-09 DIAGNOSIS — I5023 Acute on chronic systolic (congestive) heart failure: Secondary | ICD-10-CM | POA: Diagnosis not present

## 2024-03-09 DIAGNOSIS — Z9861 Coronary angioplasty status: Secondary | ICD-10-CM | POA: Diagnosis not present

## 2024-03-09 LAB — COMPREHENSIVE METABOLIC PANEL WITH GFR
ALT: 27 U/L (ref 0–44)
AST: 29 U/L (ref 15–41)
Albumin: 4.8 g/dL (ref 3.5–5.0)
Alkaline Phosphatase: 94 U/L (ref 38–126)
Anion gap: 13 (ref 5–15)
BUN: 24 mg/dL — ABNORMAL HIGH (ref 6–20)
CO2: 25 mmol/L (ref 22–32)
Calcium: 9.3 mg/dL (ref 8.9–10.3)
Chloride: 92 mmol/L — ABNORMAL LOW (ref 98–111)
Creatinine, Ser: 1.33 mg/dL — ABNORMAL HIGH (ref 0.61–1.24)
GFR, Estimated: >60 mL/min
Glucose, Bld: 213 mg/dL — ABNORMAL HIGH (ref 70–99)
Potassium: 4.8 mmol/L (ref 3.5–5.1)
Sodium: 130 mmol/L — ABNORMAL LOW (ref 135–145)
Total Bilirubin: 1.9 mg/dL — ABNORMAL HIGH (ref 0.0–1.2)
Total Protein: 8.9 g/dL — ABNORMAL HIGH (ref 6.5–8.1)

## 2024-03-09 LAB — CBC
HCT: 51 % (ref 39.0–52.0)
Hemoglobin: 17.5 g/dL — ABNORMAL HIGH (ref 13.0–17.0)
MCH: 31.2 pg (ref 26.0–34.0)
MCHC: 34.3 g/dL (ref 30.0–36.0)
MCV: 90.9 fL (ref 80.0–100.0)
Platelets: 317 K/uL (ref 150–400)
RBC: 5.61 MIL/uL (ref 4.22–5.81)
RDW: 13.5 % (ref 11.5–15.5)
WBC: 9.2 K/uL (ref 4.0–10.5)
nRBC: 0 % (ref 0.0–0.2)

## 2024-03-09 LAB — COOXEMETRY PANEL
Carboxyhemoglobin: 1.6 % — ABNORMAL HIGH (ref 0.5–1.5)
Carboxyhemoglobin: 0.7 % (ref 0.5–1.5)
Methemoglobin: <0.7 % (ref 0.0–1.5)
Methemoglobin: <0.7 % (ref 0.0–1.5)
O2 Saturation: 57.1 %
O2 Saturation: 42 %
Total hemoglobin: 17.9 g/dL — ABNORMAL HIGH (ref 12.0–16.0)
Total hemoglobin: 18 g/dL — ABNORMAL HIGH (ref 12.0–16.0)

## 2024-03-09 LAB — MAGNESIUM: Magnesium: 2.6 mg/dL — ABNORMAL HIGH (ref 1.7–2.4)

## 2024-03-09 MED ORDER — TORSEMIDE 20 MG PO TABS
40.0000 mg | ORAL_TABLET | Freq: Every day | ORAL | Status: DC
  Administered 2024-03-11 – 2024-03-15 (×5): 40 mg via ORAL
  Filled 2024-03-09 (×6): qty 2

## 2024-03-09 MED ORDER — TORSEMIDE 20 MG PO TABS: 20.0000 mg | ORAL_TABLET | Freq: Every day | ORAL | Status: DC

## 2024-03-09 MED ORDER — SODIUM CHLORIDE 0.9 % IV SOLN: 250.0000 mL | INTRAVENOUS | Status: DC | PRN

## 2024-03-09 MED ORDER — SODIUM CHLORIDE 0.9% FLUSH
3.0000 mL | Freq: Two times a day (BID) | INTRAVENOUS | Status: DC
  Administered 2024-03-09: 3 mL via INTRAVENOUS

## 2024-03-09 MED ORDER — SODIUM CHLORIDE 0.9% FLUSH: 3.0000 mL | INTRAVENOUS | Status: DC | PRN

## 2024-03-09 MED ORDER — TORSEMIDE 20 MG PO TABS: 40.0000 mg | ORAL_TABLET | Freq: Every day | ORAL | Status: DC

## 2024-03-09 MED ORDER — ISOSORB DINITRATE-HYDRALAZINE 20-37.5 MG PO TABS
0.5000 | ORAL_TABLET | Freq: Three times a day (TID) | ORAL | Status: DC
  Administered 2024-03-09 – 2024-03-10 (×5): 0.5 via ORAL
  Filled 2024-03-09 (×5): qty 1

## 2024-03-09 MED ORDER — TORSEMIDE 20 MG PO TABS
20.0000 mg | ORAL_TABLET | Freq: Every day | ORAL | Status: DC
  Administered 2024-03-10: 20 mg via ORAL
  Filled 2024-03-09: qty 1

## 2024-03-09 NOTE — TOC Progression Note (Signed)
 Transition of Care [facility]) - Progression Note    Patient Details  Name: Reginald Fernandez MRN: [id number] Date of Birth: 03/30/1980  Transition of [facility] CM/SW Contact  [name] Phone Number: [phone] 03/09/2024, 12:45 PM  Clinical Narrative:   [facility] CSW attempted to schedule patients hospital follow up appointment. The patients provider is booked and they are not allowed to schedule past 14 days for hospital follow up appointments per Delon HERO. The office will contact the patient back directly to schedule and earlier follow up appointment.   [facility] CSW/CM will continue to follow and monitor for dc readiness.                       Expected Discharge Plan and Services                                               Social Drivers of Health (SDOH) Interventions SDOH Screenings   Food Insecurity: No Food Insecurity (03/07/2024)  Housing: Low Risk (03/07/2024)  Transportation Needs: No Transportation Needs (03/07/2024)  Utilities: Not At Risk (03/07/2024)  Depression (PHQ2-9): Low Risk (06/27/2022)  Social Connections: Unknown (03/07/2024)  Tobacco Use: Medium Risk (03/04/2024)    Readmission Risk Interventions     No data to display

## 2024-03-09 NOTE — Telephone Encounter (Signed)
 Copied from CRM #[id number]. Topic: Appointments - Scheduling Inquiry for Clinic >> Mar 09, 2024 12:44 PM Delon HERO wrote:  Reason for CRM: Adrianne calling from [facility] calling to request a hospital follow up appointment for the patient. There are no available appointments within 14 days. Please advise with the patient.

## 2024-03-09 NOTE — Progress Notes (Signed)
 Milrinone  weaned to 0.125 earlier today. Repeat co-ox following wean 42%.   Feels ok, no concerns. Plan discussed with Dr. Bensimhon, will go ahead at place orders for RHC tomorrow morning. RN updated.   Discussed RHC with patient. All questions answered.   Informed Consent   Shared Decision Making/Informed Consent The risks, including but not limited to, [bleeding or vascular complications (1 in 500), pneumothorax (1 in 1600), arrhythmia (1 in 1000) and death (1 in 5000)], benefits (diagnostic support and/or management of heart failure, pulmonary hypertension) and alternatives of a right heart catheterization were discussed in detail with Reginald Fernandez and he is willing to proceed.      Beckey LITTIE Coe AGACNP-BC  Advanced Heart Failure Team  03/09/2024

## 2024-03-09 NOTE — H&P (View-Only) (Signed)
 Milrinone  weaned to 0.125 earlier today. Repeat co-ox following wean 42%.   Feels ok, no concerns. Plan discussed with Dr. Bensimhon, will go ahead at place orders for RHC tomorrow morning. RN updated.   Discussed RHC with patient. All questions answered.   Informed Consent   Shared Decision Making/Informed Consent The risks, including but not limited to, [bleeding or vascular complications (1 in 500), pneumothorax (1 in 1600), arrhythmia (1 in 1000) and death (1 in 5000)], benefits (diagnostic support and/or management of heart failure, pulmonary hypertension) and alternatives of a right heart catheterization were discussed in detail with Mr. Spofford and he is willing to proceed.      Beckey LITTIE Coe AGACNP-BC  Advanced Heart Failure Team  03/09/2024

## 2024-03-09 NOTE — Progress Notes (Signed)
 " [name]  FMW:[id number] DOB: 1980-11-19 DOA: 03/04/2024 PCP: [name], MD    Chief Complaint  Patient presents with   Chest Pain   Shortness of Breath   Dehydration   Abdominal Pain    Brief Narrative:  Patient 43 year old gentleman history of CAD, hypertension, hyperlipidemia, morbid obesity, history of PE on Eliquis , constipation, medication nonadherence due to financial constraints presented to the ED with abdominal pain, constipation, shortness of breath.  Patient noted on admission to have elevated proBNP, admitted for acute CHF exacerbation and constipation.  Patient placed on IV Lasix .  Cardiology consulted and patient being followed by the heart failure team.   Assessment & Plan:   Principal Problem:   Acute on chronic systolic heart failure (HCC) Active Problems:   Dilated cardiomyopathy (HCC)   Elevated troponin   Pulmonary emboli (HCC)   Morbid obesity (HCC)   Constipation   Essential hypertension   Hyperlipidemia LDL goal <70   CAD S/P percutaneous coronary angioplasty: DES PCI -Cx-OM1, RCA x 2   Generalized abdominal pain  #1 acute on chronic biventricular systolic heart failure/dilated cardiomyopathy - Patient presented with shortness of breath, abdominal discomfort, medication nonadherence in the past per patient secondary to financial constraints (due to not having insurance). - Repeat 2D echo with EF <20%, severely decreased left ventricular function, left ventricular global hypokinesis, grade 2 DD, severely reduced right ventricular systolic function, normal pulmonary artery systolic pressure, severely dilated left atrial size, mildly dilated right atrial size, trivial MVR. - proBNP elevated at 3698. - Patient initially placed on Lasix  40 mg IV every 12 hours and dose increased to 80 mg IV every 12 hours and then subsequently increased to 3 times daily with a urine output of 4.6 L over the past 24 hours.  - Patient is -6.5 L  during this hospitalization.   - Current weight of 264.99 pounds from 268.4 pounds from 272.4 pounds from 277.89 pounds on admission.  - Due to patient's dilated cardiomyopathy, complicated cardiac history cardiology consulted and patient seen by the advanced heart failure team. - Cardiology recommended PICC placement to check Co ox. - PICC line placed 03/06/2024.. - Patient currently on milrinone  and Lasix  80 mg IV 3 times daily. - Per cardiology patient with Co. ox improved at 57% today and milrinone  being weaned down per cardiology.   - CVP <5, patient euvolemic on examination.   - IV Lasix  has been discontinued per cardiology and patient started on torsemide  40 mg every morning and 20 mg every afternoon.   - Continue digoxin  0.125 mg daily  - Continue Entresto  24 to 26 mg twice daily  - Continue spironolactone  25 mg daily.   - Continue Farxiga  10 mg daily.   - Cardiology recommending RHC once milrinone  has been weaned off and patient for probable RHC tomorrow.   -Per cardiology.  2.  NSVT/PVCs -Patient noted to have runs of NSVT on telemetry and noted to have multiple PVCs. - Continue amiodarone  200 mg daily and Mexitil  300 mg twice daily per cardiology. - Keep potassium approximately 4, magnesium  approximately 2.  3.  Abdominal pain/abdominal discomfort/constipation ruled out - Likely secondary to low cardiac output.   - Initially felt secondary to constipation. -CT abdomen and pelvis with no acute abnormalities noted. - Patient however stated he cleansed his bowels prior to admission. - Patient with significant clinical improvement with diuresis. -Continue treatment as in problem #1. - Follow.  4.  Elevated bilirubin -  Felt likely secondary to low cardiac output - Bilirubin improving with diuresis.  -Follow.  5.  History of PE/DVT - Eliquis .    6.  Elevated troponin/CAD -Likely secondary to demand ischemia as troponin levels have plateaued. - Patient denies any chest  pain. -Patient with history of CAD with inferolateral STEMI 9/15 with DES to left circumflex into OM1 and DES to RCA.  Patient with family history of premature CAD. -Patient with LHC 7/23 that showed diffuse CAD but no severe stenosis noted. -Cardiology following.  7.  Hyperlipidemia - Continue atorvastatin  80 mg daily, ezetimibe  10 mg daily.  8.  Hypertension - Continue spironolactone , Entresto .   - Patient to be started on BiDil  today per cardiology.   - IV Lasix  discontinued and patient being transition to Demadex .    9.  Morbid obesity -Lifestyle modification. - Outpatient follow-up with PCP.   DVT prophylaxis: Eliquis  Code Status: Full Family Communication: Updated patient.  No family at bedside. Disposition: Likely home when clinically improved and cleared by cardiology.  Status is: Inpatient Remains inpatient appropriate because: Severity of illness   Consultants:  Cardiology/advanced heart failure team: Dr. Zenaida 05/08/2023  Procedures:  CT angiogram chest 03/04/2024 CT abdomen and pelvis 03/04/2024 2D echo 05/07/2023 PICC line placement 03/06/2024  Antimicrobials:  Anti-infectives (From admission, onward)    None         Subjective: Sitting up in chair.  Continues to feel better daily.  Shortness of breath improved.  Denies any chest pain.  Denies any further abdominal discomfort.  Stated had significant urine output.  Stated ambulated in the hallways without any significant shortness of breath.   Objective: Vitals:   03/08/24 2000 03/08/24 2305 03/09/24 0300 03/09/24 0739  BP: (!) 118/92 (!) 128/101 (!) 121/98 123/76  Pulse: (!) 101 93 91   Resp: 20 20 20 19   Temp: (!) 97.4 F (36.3 C) 98 F (36.7 C) 97.7 F (36.5 C) 97.8 F (36.6 C)  TempSrc: Oral Oral Oral Oral  SpO2: 99% 93% 99% 100%  Weight:   120.2 kg   Height:   6' 2 (1.88 m)     Intake/Output Summary (Last 24 hours) at 03/09/2024 1012 Last data filed at 03/09/2024 9187 Gross per 24  hour  Intake 1727.14 ml  Output 4725 ml  Net -2997.86 ml   Filed Weights   03/07/24 0500 03/08/24 0500 03/09/24 0300  Weight: 123.6 kg 121.7 kg 120.2 kg    Examination:  General exam: NAD. Respiratory system: Decreased breath sounds in the bases.  Significantly decreased bibasilar crackles.  No lower extremity edema.  Cardiovascular system: RRR no murmurs rubs or gallops.  No JVD.  No lower extremity edema.   Gastrointestinal system: Abdomen is soft, nontender, nondistended, positive bowel sounds.  No rebound.  No guarding.   Central nervous system: Alert and oriented.  Moving extremities spontaneously no focal neurological deficits. Extremities: No lower extremity edema.  Skin: No rashes, lesions or ulcers Psychiatry: Judgement and insight appear normal. Mood & affect appropriate.     Data Reviewed: I have personally reviewed following labs and imaging studies  CBC: Recent Labs  Lab 03/04/24 1833 03/06/24 0328 03/07/24 0500 03/08/24 0500 03/09/24 0500  WBC 10.6* 9.9 9.5 9.4 9.2  HGB 15.4 15.1 15.6 16.5 17.5*  HCT 45.5 44.4 45.3 47.1 51.0  MCV 92.1 93.9 91.9 90.9 90.9  PLT 271 264 270 268 317    Basic Metabolic Panel: Recent Labs  Lab 03/04/24 1915 03/06/24 0328 03/06/24 1549 03/06/24  1700 03/06/24 2257 03/07/24 0500 03/08/24 0500 03/09/24 0500  NA 141 136  --   --   --  134* 133* 130*  K 4.6 3.6  --   --  4.2 4.0 4.5 4.8  CL 101 97*  --   --   --  95* 94* 92*  CO2 29 27  --   --   --  28 28 25   GLUCOSE 111* 132*  --   --   --  174* 179* 213*  BUN 13 17  --   --   --  14 15 24*  CREATININE 1.20 1.25*  --   --   --  1.22 1.29* 1.33*  CALCIUM  9.9 9.1  --   --   --  8.6* 8.9 9.3  MG  --   --  2.3 2.4  --  2.3 2.3 2.6*    GFR: Estimated Creatinine Clearance: 98.7 mL/min (A) (by C-G formula based on SCr of 1.33 mg/dL (H)).  Liver Function Tests: Recent Labs  Lab 03/04/24 1915 03/07/24 0500 03/08/24 0500 03/09/24 0500  AST 24 27 27 29   ALT 21 26 26  27   ALKPHOS 82 81 86 94  BILITOT 4.1* 3.2* 2.7* 1.9*  PROT 8.3* 7.6 8.0 8.9*  ALBUMIN 4.7 4.3 4.4 4.8    CBG: No results for input(s): GLUCAP in the last 168 hours.   Recent Results (from the past 240 hours)  Resp panel by RT-PCR (RSV, Flu A&B, Covid) Anterior Nasal Swab     Status: None   Collection Time: 03/04/24  6:33 PM   Specimen: Anterior Nasal Swab  Result Value Ref Range Status   SARS Coronavirus 2 by RT PCR NEGATIVE NEGATIVE Final    Comment: (NOTE) SARS-CoV-2 target nucleic acids are NOT DETECTED.  The SARS-CoV-2 RNA is generally detectable in upper respiratory specimens during the acute phase of infection. The lowest concentration of SARS-CoV-2 viral copies this assay can detect is 138 copies/mL. A negative result does not preclude SARS-Cov-2 infection and should not be used as the sole basis for treatment or other patient management decisions. A negative result may occur with  improper specimen collection/handling, submission of specimen other than nasopharyngeal swab, presence of viral mutation(s) within the areas targeted by this assay, and inadequate number of viral copies(<138 copies/mL). A negative result must be combined with clinical observations, patient history, and epidemiological information. The expected result is Negative.  Fact Sheet for Patients:  bloggercourse.com  Fact Sheet for Healthcare Providers:  seriousbroker.it  This test is no t yet approved or cleared by the [place]  FDA and  has been authorized for detection and/or diagnosis of SARS-CoV-2 by FDA under an Emergency Use Authorization (EUA). This EUA will remain  in effect (meaning this test can be used) for the duration of the COVID-19 declaration under Section 564(b)(1) of the Act, 21 U.S.C.section 360bbb-3(b)(1), unless the authorization is terminated  or revoked sooner.       Influenza A by PCR NEGATIVE NEGATIVE Final    Influenza B by PCR NEGATIVE NEGATIVE Final    Comment: (NOTE) The Xpert Xpress SARS-CoV-2/FLU/RSV plus assay is intended as an aid in the diagnosis of influenza from Nasopharyngeal swab specimens and should not be used as a sole basis for treatment. Nasal washings and aspirates are unacceptable for Xpert Xpress SARS-CoV-2/FLU/RSV testing.  Fact Sheet for Patients: bloggercourse.com  Fact Sheet for Healthcare Providers: seriousbroker.it  This test is not yet approved or cleared by the [place]   FDA and has been authorized for detection and/or diagnosis of SARS-CoV-2 by FDA under an Emergency Use Authorization (EUA). This EUA will remain in effect (meaning this test can be used) for the duration of the COVID-19 declaration under Section 564(b)(1) of the Act, 21 U.S.C. section 360bbb-3(b)(1), unless the authorization is terminated or revoked.     Resp Syncytial Virus by PCR NEGATIVE NEGATIVE Final    Comment: (NOTE) Fact Sheet for Patients: bloggercourse.com  Fact Sheet for Healthcare Providers: seriousbroker.it  This test is not yet approved or cleared by the [place]  FDA and has been authorized for detection and/or diagnosis of SARS-CoV-2 by FDA under an Emergency Use Authorization (EUA). This EUA will remain in effect (meaning this test can be used) for the duration of the COVID-19 declaration under Section 564(b)(1) of the Act, 21 U.S.C. section 360bbb-3(b)(1), unless the authorization is terminated or revoked.  Performed at [facility], [address] [place], [place] [address]          Radiology Studies: No results found.       Scheduled Meds:  amiodarone   400 mg Oral BID   apixaban   5 mg Oral BID   atorvastatin   80 mg Oral q1800   Chlorhexidine  Gluconate Cloth  6 each Topical Daily   dapagliflozin  propanediol  10 mg Oral  Daily   digoxin   0.125 mg Oral Daily   ezetimibe   10 mg Oral Daily   mexiletine  300 mg Oral BID   pantoprazole   40 mg Oral Daily   sacubitril -valsartan   1 tablet Oral BID   sodium chloride  flush  10-40 mL Intracatheter Q12H   spironolactone   25 mg Oral Daily   torsemide   20 mg Oral Daily   And   [START ON 03/10/2024] torsemide   40 mg Oral Daily   Continuous Infusions:  milrinone  0.125 mcg/kg/min (03/09/24 0812)     LOS: 3 days    Time spent: 40 minutes    Toribio Hummer, MD [facility]   To contact the attending provider between 7A-7P or the covering provider during after hours 7P-7A, please log into the web site [email or web address] and access using universal [place] password for that web site. If you do not have the password, please call the hospital operator.  03/09/2024, 10:12 AM    "

## 2024-03-09 NOTE — Telephone Encounter (Signed)
 Patient is still admitted at the hospital. I advised the patient to call back to schedule the appointment once discharged. Patient acknowledged.

## 2024-03-09 NOTE — Progress Notes (Addendum)
 "   Advanced Heart Failure Rounding Note   Subjective:    Started on milrinone  for low output. Now on milrinone  0.25  Co-ox 57%. CVP <5. -4.6L UOP with lasix  IV 80 TID. Weight down 4lbs.   Rhythm stable.   Feels good this morning, sitting up in chair eating breakfast. Denies CP/SOB. Has been walking out in the halls with no issues.   Objective:   Weight Range:  Vital Signs:   Temp:  [97.4 F (36.3 C)-98.7 F (37.1 C)] 97.7 F (36.5 C) (12/22 0300) Pulse Rate:  [85-101] 91 (12/22 0300) Resp:  [18-99] 20 (12/22 0300) BP: (100-128)/(74-101) 121/98 (12/22 0300) SpO2:  [93 %-100 %] 99 % (12/22 0300) Weight:  [120.2 kg] 120.2 kg (12/22 0300) Last BM Date : 03/06/24  Weight change: Filed Weights   03/07/24 0500 03/08/24 0500 03/09/24 0300  Weight: 123.6 kg 121.7 kg 120.2 kg    Intake/Output:   Intake/Output Summary (Last 24 hours) at 03/09/2024 0718 Last data filed at 03/09/2024 0300 Gross per 24 hour  Intake 1727.14 ml  Output 4275 ml  Net -2547.86 ml     Physical Exam: General:  well appearing, sitting up in chair.  No respiratory difficulty Neck: JVD flat.  Cor: Regular rate & rhythm. No murmurs. Lungs: clear Extremities: no edema  Neuro: alert & oriented x 3. Affect pleasant.   Telemetry: NSR 80s 0-27 PVCs (started after ~5am) Personally reviewed   Labs: Basic Metabolic Panel: Recent Labs  Lab 03/04/24 1915 03/06/24 0328 03/06/24 1549 03/06/24 1700 03/06/24 2257 03/07/24 0500 03/08/24 0500 03/09/24 0500  NA 141 136  --   --   --  134* 133* 130*  K 4.6 3.6  --   --  4.2 4.0 4.5 4.8  CL 101 97*  --   --   --  95* 94* 92*  CO2 29 27  --   --   --  28 28 25   GLUCOSE 111* 132*  --   --   --  174* 179* 213*  BUN 13 17  --   --   --  14 15 24*  CREATININE 1.20 1.25*  --   --   --  1.22 1.29* 1.33*  CALCIUM  9.9 9.1  --   --   --  8.6* 8.9 9.3  MG  --   --  2.3 2.4  --  2.3 2.3 2.6*    Liver Function Tests: Recent Labs  Lab 03/04/24 1915  03/07/24 0500 03/08/24 0500 03/09/24 0500  AST 24 27 27 29   ALT 21 26 26 27   ALKPHOS 82 81 86 94  BILITOT 4.1* 3.2* 2.7* 1.9*  PROT 8.3* 7.6 8.0 8.9*  ALBUMIN 4.7 4.3 4.4 4.8   Recent Labs  Lab 03/04/24 1915  LIPASE 23   No results for input(s): AMMONIA in the last 168 hours.  CBC: Recent Labs  Lab 03/04/24 1833 03/06/24 0328 03/07/24 0500 03/08/24 0500 03/09/24 0500  WBC 10.6* 9.9 9.5 9.4 9.2  HGB 15.4 15.1 15.6 16.5 17.5*  HCT 45.5 44.4 45.3 47.1 51.0  MCV 92.1 93.9 91.9 90.9 90.9  PLT 271 264 270 268 317    Cardiac Enzymes: No results for input(s): CKTOTAL, CKMB, CKMBINDEX, TROPONINI in the last 168 hours.  BNP: BNP (last 3 results) Recent Labs    08/03/23 1028  BNP 892.8*    ProBNP (last 3 results) Recent Labs    03/04/24 1833  PROBNP 3,698.0*    Other results:  Imaging: No results found.   Medications:   Scheduled Medications:  amiodarone   400 mg Oral BID   apixaban   5 mg Oral BID   atorvastatin   80 mg Oral q1800   Chlorhexidine  Gluconate Cloth  6 each Topical Daily   dapagliflozin  propanediol  10 mg Oral Daily   digoxin   0.125 mg Oral Daily   ezetimibe   10 mg Oral Daily   furosemide   80 mg Intravenous TID   mexiletine  300 mg Oral BID   pantoprazole   40 mg Oral Daily   sacubitril -valsartan   1 tablet Oral BID   sodium chloride  flush  10-40 mL Intracatheter Q12H   spironolactone   25 mg Oral Daily    Infusions:  milrinone  0.25 mcg/kg/min (03/08/24 2143)    PRN Medications: acetaminophen  **OR** acetaminophen , HYDROmorphone  (DILAUDID ) injection, methocarbamol , naLOXone  (NARCAN )  injection, nitroGLYCERIN , ondansetron  **OR** ondansetron  (ZOFRAN ) IV, polyethylene glycol, senna-docusate, sodium chloride  flush Assessment/Plan:   A/C Biventricular HFrEF, 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 Followed in the HF clinic with previous discussions about advanced therapies. We are still concerned he may need advanced  therapies. Suspect abdominal issues are related to low output HF.  Check Blood Type.  NYHA Stage D. - On milrinone  0.25. Co-ox improved. 57% today. Will wean to 0.125, follow co-ox this afternoon.  - CVP <5, appears euvolemic on exam. Stop IV lasix . Start Torsemide  40am/20pm, to start this afternoon (hold morning dose of diuretics).  - Continue digoxin  0.125 mg daily  - Continue Entresto  24-26 mg twice a day. - Continue Spiro 25 mg daily   - Continue Farxiga  10 mg daily - Long discussion about potential need for advanced therapies. Will need RHC once milrinone  weaned (or if fails)   NSVT/PVCs   - High PVC burden.  - Continue amio 400 mg daily + mexitil  300 mg twice a day.  - Keep K >4 and Mg >2. Stable.  - TSH ok   Abdominal Pain -CT negative. Suspect this is related to low output heart failure.    Elevated TBili -Suspect in the setting of low output HF. Follow daily CMET  T bili 4.1 -> 2.7-> 1.9   CAD  - Strong family history of premature CAD.  H/o inferolateral STEMI in 9/15 with DES to LCx into OM1 and DES to RCA.  LHC this admit 7/23 demonstrated diffuse CAD but no severe stenosis to explain worsening of his EF.   - No s/s angina - Continue atorvastatin  and zetia    H/O PE/DVT - Continue eliquis . No bleeding    Length of Stay: 3   Alma LITTIE Coe AGACNP-BC  03/09/2024, 7:18 AM  Advanced Heart Failure Team Pager [phone] (M-F; 7a - 4p)  Please contact [facility] for night-coverage after hours (4p -7a ) and weekends on [email or web address]   Patient seen and examined with the above-signed Advanced Practice Provider and/or Housestaff. I personally reviewed laboratory data, imaging studies and relevant notes. I independently examined the patient and formulated the important aspects of the plan. I have edited the note to reflect any of my changes or salient points. I have personally discussed the plan with the patient and/or family.  Remains on milrinone  0.25. Diuresed well. CVP low.  Co-ox marginal.   General:  Sitting up in chair. No resp difficulty HEENT: normal Neck: supple. no JVD.  Cor: Regular rate & rhythm. No rubs, gallops or murmurs. Lungs: clear Abdomen: soft, nontender, nondistended.Good bowel sounds. Extremities: no cyanosis, clubbing, rash, edema Neuro: alert &  orientedx3, cranial nerves grossly intact. moves all 4 extremities w/o difficulty. Affect pleasant  He is now euvolemic. Start milrinone  wean. Will need RHC prior to d/c. Suspect we are getting near time for advanced therapies. Will see how milrinone  wean goes. Start bidil .   Toribio Fuel, MD  1:03 PM  "

## 2024-03-09 NOTE — Plan of Care (Signed)
   Problem: Health Behavior/Discharge Planning: Goal: Ability to manage health-related needs will improve Outcome: Progressing

## 2024-03-10 ENCOUNTER — Other Ambulatory Visit (HOSPITAL_COMMUNITY): Payer: Self-pay

## 2024-03-10 ENCOUNTER — Telehealth (HOSPITAL_COMMUNITY): Payer: Self-pay | Admitting: Pharmacy Technician

## 2024-03-10 ENCOUNTER — Inpatient Hospital Stay (HOSPITAL_COMMUNITY)

## 2024-03-10 ENCOUNTER — Encounter (HOSPITAL_COMMUNITY)
Admission: EM | Disposition: A | Discharge status: Home Health | Payer: Self-pay | Source: Home / Self Care | Attending: Internal Medicine

## 2024-03-10 DIAGNOSIS — I5023 Acute on chronic systolic (congestive) heart failure: Secondary | ICD-10-CM

## 2024-03-10 DIAGNOSIS — Z01810 Encounter for preprocedural cardiovascular examination: Secondary | ICD-10-CM

## 2024-03-10 DIAGNOSIS — I5082 Biventricular heart failure: Secondary | ICD-10-CM

## 2024-03-10 DIAGNOSIS — I10 Essential (primary) hypertension: Secondary | ICD-10-CM | POA: Diagnosis not present

## 2024-03-10 DIAGNOSIS — I2694 Multiple subsegmental pulmonary emboli without acute cor pulmonale: Secondary | ICD-10-CM | POA: Diagnosis not present

## 2024-03-10 DIAGNOSIS — I420 Dilated cardiomyopathy: Secondary | ICD-10-CM | POA: Diagnosis not present

## 2024-03-10 HISTORY — PX: RIGHT HEART CATH: CATH118263

## 2024-03-10 LAB — BASIC METABOLIC PANEL WITH GFR
Anion gap: 10 (ref 5–15)
BUN: 24 mg/dL — ABNORMAL HIGH (ref 6–20)
CO2: 25 mmol/L (ref 22–32)
Calcium: 9.1 mg/dL (ref 8.9–10.3)
Chloride: 96 mmol/L — ABNORMAL LOW (ref 98–111)
Creatinine, Ser: 1.19 mg/dL (ref 0.61–1.24)
GFR, Estimated: >60 mL/min
Glucose, Bld: 138 mg/dL — ABNORMAL HIGH (ref 70–99)
Potassium: 5 mmol/L (ref 3.5–5.1)
Sodium: 131 mmol/L — ABNORMAL LOW (ref 135–145)

## 2024-03-10 LAB — PULMONARY FUNCTION TEST
DL/VA % pred: 102 %
DL/VA: 4.6 ml/min/mmHg/L
DLCO cor % pred: 75 %
DLCO cor: 25.97 ml/min/mmHg
DLCO unc % pred: 80 %
DLCO unc: 27.63 ml/min/mmHg
FEF 25-75 Pre: 2.24 L/s
FEF2575-%Pred-Pre: 53 %
FEV1-%Pred-Pre: 60 %
FEV1-Pre: 2.82 L
FEV1FVC-%Pred-Pre: 93 %
FEV6-%Pred-Pre: 64 %
FEV6-Pre: 3.72 L
FEV6FVC-%Pred-Pre: 102 %
FVC-%Pred-Pre: 64 %
FVC-Pre: 3.81 L
Pre FEV1/FVC ratio: 74 %
Pre FEV6/FVC Ratio: 100 %
RV % pred: 113 %
RV: 2.4 L
TLC % pred: 78 %
TLC: 6.1 L

## 2024-03-10 LAB — POCT I-STAT EG7
Acid-Base Excess: 0 mmol/L (ref 0.0–2.0)
Acid-base deficit: 1 mmol/L (ref 0.0–2.0)
Bicarbonate: 24.4 mmol/L (ref 20.0–28.0)
Bicarbonate: 25.8 mmol/L (ref 20.0–28.0)
Calcium, Ion: 1.15 mmol/L (ref 1.15–1.40)
Calcium, Ion: 1.17 mmol/L (ref 1.15–1.40)
HCT: 53 % — ABNORMAL HIGH (ref 39.0–52.0)
HCT: 53 % — ABNORMAL HIGH (ref 39.0–52.0)
Hemoglobin: 18 g/dL — ABNORMAL HIGH (ref 13.0–17.0)
Hemoglobin: 18 g/dL — ABNORMAL HIGH (ref 13.0–17.0)
O2 Saturation: 69 %
O2 Saturation: 70 %
Potassium: 4.8 mmol/L (ref 3.5–5.1)
Potassium: 4.9 mmol/L (ref 3.5–5.1)
Sodium: 136 mmol/L (ref 135–145)
Sodium: 136 mmol/L (ref 135–145)
TCO2: 26 mmol/L (ref 22–32)
TCO2: 27 mmol/L (ref 22–32)
pCO2, Ven: 41.2 mmHg — ABNORMAL LOW (ref 44–60)
pCO2, Ven: 42.8 mmHg — ABNORMAL LOW (ref 44–60)
pH, Ven: 7.381 (ref 7.25–7.43)
pH, Ven: 7.388 (ref 7.25–7.43)
pO2, Ven: 36 mmHg (ref 32–45)
pO2, Ven: 38 mmHg (ref 32–45)

## 2024-03-10 LAB — CBC
HCT: 50.5 % (ref 39.0–52.0)
Hemoglobin: 17.1 g/dL — ABNORMAL HIGH (ref 13.0–17.0)
MCH: 31 pg (ref 26.0–34.0)
MCHC: 33.9 g/dL (ref 30.0–36.0)
MCV: 91.7 fL (ref 80.0–100.0)
Platelets: 301 K/uL (ref 150–400)
RBC: 5.51 MIL/uL (ref 4.22–5.81)
RDW: 13.4 % (ref 11.5–15.5)
WBC: 8.8 K/uL (ref 4.0–10.5)
nRBC: 0 % (ref 0.0–0.2)

## 2024-03-10 LAB — COOXEMETRY PANEL
Carboxyhemoglobin: 1.3 % (ref 0.5–1.5)
Methemoglobin: 0.7 % (ref 0.0–1.5)
O2 Saturation: 66.2 %
Total hemoglobin: 17.2 g/dL — ABNORMAL HIGH (ref 12.0–16.0)

## 2024-03-10 LAB — MAGNESIUM: Magnesium: 2.7 mg/dL — ABNORMAL HIGH (ref 1.7–2.4)

## 2024-03-10 LAB — PREALBUMIN: Prealbumin: 21 mg/dL (ref 18–38)

## 2024-03-10 LAB — VAS US DOPPLER PRE VAD
Left ABI: 1.12
Right ABI: 1.15

## 2024-03-10 MED ORDER — ONDANSETRON HCL 4 MG/2ML IJ SOLN
4.0000 mg | Freq: Four times a day (QID) | INTRAMUSCULAR | Status: DC | PRN
  Administered 2024-03-11 – 2024-03-17 (×5): 4 mg via INTRAVENOUS
  Filled 2024-03-10 (×5): qty 2

## 2024-03-10 MED ORDER — SODIUM CHLORIDE 0.9% FLUSH
3.0000 mL | Freq: Two times a day (BID) | INTRAVENOUS | Status: DC
  Administered 2024-03-10 – 2024-03-19 (×18): 3 mL via INTRAVENOUS

## 2024-03-10 MED ORDER — HYDRALAZINE HCL 20 MG/ML IJ SOLN: 10.0000 mg | INTRAMUSCULAR | Status: AC | PRN

## 2024-03-10 MED ORDER — FENTANYL CITRATE (PF) 100 MCG/2ML IJ SOLN
INTRAMUSCULAR | Status: AC
  Filled 2024-03-10: qty 2

## 2024-03-10 MED ORDER — MILRINONE LACTATE IN DEXTROSE 20-5 MG/100ML-% IV SOLN
0.1250 ug/kg/min | INTRAVENOUS | Status: DC
  Administered 2024-03-10 – 2024-03-18 (×10): 0.125 ug/kg/min via INTRAVENOUS
  Filled 2024-03-10 (×6): qty 100

## 2024-03-10 MED ORDER — ACETAMINOPHEN 325 MG PO TABS: 650.0000 mg | ORAL_TABLET | ORAL | Status: DC | PRN

## 2024-03-10 MED ORDER — SODIUM CHLORIDE 0.9 % IV SOLN: 250.0000 mL | INTRAVENOUS | Status: AC | PRN

## 2024-03-10 MED ORDER — MILRINONE LACTATE IN DEXTROSE 20-5 MG/100ML-% IV SOLN: 0.1250 ug/kg/min | INTRAVENOUS | Status: DC

## 2024-03-10 MED ORDER — FENTANYL CITRATE (PF) 100 MCG/2ML IJ SOLN
INTRAMUSCULAR | Status: DC | PRN
  Administered 2024-03-10 (×2): 25 ug via INTRAVENOUS

## 2024-03-10 MED ORDER — HEPARIN (PORCINE) IN NACL 1000-0.9 UT/500ML-% IV SOLN
INTRAVENOUS | Status: DC | PRN
  Administered 2024-03-10: 500 mL

## 2024-03-10 MED ORDER — SODIUM CHLORIDE 0.9% FLUSH: 3.0000 mL | INTRAVENOUS | Status: DC | PRN

## 2024-03-10 MED ORDER — MIDAZOLAM HCL (PF) 2 MG/2ML IJ SOLN
INTRAMUSCULAR | Status: DC | PRN
  Administered 2024-03-10: 1 mg via INTRAVENOUS

## 2024-03-10 MED ORDER — LIDOCAINE HCL (PF) 1 % IJ SOLN
INTRAMUSCULAR | Status: DC | PRN
  Administered 2024-03-10: 5 mL

## 2024-03-10 MED ORDER — LIDOCAINE HCL (PF) 1 % IJ SOLN
INTRAMUSCULAR | Status: AC
  Filled 2024-03-10: qty 30

## 2024-03-10 MED ORDER — MIDAZOLAM HCL 2 MG/2ML IJ SOLN
INTRAMUSCULAR | Status: AC
  Filled 2024-03-10: qty 2

## 2024-03-10 NOTE — Telephone Encounter (Signed)
 Patient Product/process Development Scientist completed.    The patient is insured through [employer]. Patient has [employer], may use a copay card, and/or apply for patient assistance if available.    Ran test claim for Bidil  (Brand and Generic) and Product not on Formulary   This test claim was processed through [facility]- copay amounts may vary at other pharmacies due to [employer], or as the patient moves through the different stages of their insurance plan.     Reyes Sharps, CPHT Pharmacy Technician Patient Advocate Specialist [facility] Patient Advocate Team Direct Number: [phone]  Fax: [phone]

## 2024-03-10 NOTE — Progress Notes (Signed)
 VAD Coordinator note:  We met Reginald Fernandez in May 2025 he was consented for VAD workup at that time but no showed his hospital f/u. We have not seen the pt since. He was recently readmitted with HF and had heart cath this morning. Pt and I had a long talk today. He was tearful. Pt is agreeable to finishing his VAD workup. We will follow closely guided by provider team.  Lauraine Ip RN, BSN VAD Coordinator 24/7 Pager [phone]

## 2024-03-10 NOTE — TOC Initial Note (Addendum)
 Transition of Care [facility]) - Initial/Assessment Note    Patient Details  Name: Reginald Fernandez MRN: [id number] Date of Birth: 07-06-1980  Transition [facility]) CM/SW Contact:    Justina Delcia Czar, RN Phone Number: [phone] 03/10/2024, 4:21 PM  Clinical Narrative:                  Referral received for Home Milrinone . Spoke to pt at bedside. States he works full-time and had concerns about Home Milrinone  and working. He wants to remain independent.   Patient has a scale for daily weight monitoring and possesses the Living Better with Heart Failure booklet. Patient educated on the importance of daily weights, medication adherence, and following a low-sodium, heart-healthy diet. Discussed maintaining a healthy weight for transplant candidacy.   Contacted [employer] rep, Pam with new referral for Home Milrinone .     Expected Discharge Plan: Home w Home Health Services Barriers to Discharge: Continued Medical Work up   Patient Goals and CMS Choice Patient states their goals for this hospitalization and ongoing recovery are:: wants to get better CMS Medicare.gov Compare Post Acute Care list provided to:: Patient Choice offered to / list presented to : Patient      Expected Discharge Plan and Services   Discharge Planning Services: CM Consult Post Acute Care Choice: Home Health Living arrangements for the past 2 months: Single Family Home                           HH Arranged: RN HH Agency: [employer] Date HH Agency Contacted: 03/10/24 Time HH Agency Contacted: 1100 Representative spoke with at [facility] Agency: Holley Herring  Prior Living Arrangements/Services Living arrangements for the past 2 months: Single Family Home Lives with:: Significant Other, Minor Children Patient language and need for interpreter reviewed:: Yes Do you feel safe going back to the place where you live?: Yes      Need for Family Participation in Patient Care: No (Comment) Care giver support system  in place?: No (comment) Current home services: DME (scale) Criminal Activity/Legal Involvement Pertinent to Current Situation/Hospitalization: No - Comment as needed  Activities of Daily Living   ADL Screening (condition at time of admission) Independently performs ADLs?: Yes (appropriate for developmental age) Is the patient deaf or have difficulty hearing?: No Does the patient have difficulty seeing, even when wearing glasses/contacts?: No Does the patient have difficulty concentrating, remembering, or making decisions?: No  Permission Sought/Granted Permission sought to share information with : Case Manager, Family Supports, PCP Permission granted to share information with : Yes, Verbal Permission Granted              Emotional Assessment Appearance:: Appears stated age Attitude/Demeanor/Rapport: Engaged Affect (typically observed): Accepting Orientation: : Oriented to Self, Oriented to  Time, Oriented to Situation, Oriented to Place   Psych Involvement: No (comment)  Admission diagnosis:  Acute on chronic systolic heart failure (HCC) [I50.23] Generalized abdominal pain [R10.84] Acute on chronic systolic congestive heart failure (HCC) [I50.23] Patient Active Problem List   Diagnosis Date Noted   Generalized abdominal pain 03/06/2024   Constipation 03/05/2024   Acute on chronic systolic heart failure (HCC) 03/04/2024   Obesity, 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   Pneumonia of right lower lobe due to infectious organism 08/04/2023   Pulmonary emboli (HCC) 08/03/2023   Acute pyelonephritis 06/07/2022   Acute pulmonary embolism (HCC) 06/06/2022   Elevated troponin 06/04/2022   Acute on chronic combined systolic and diastolic  CHF (congestive heart failure) (HCC) 06/04/2022   CHF (congestive heart failure) (HCC) 10/09/2021   Educated about COVID-19 virus infection 12/06/2019   Prediabetes 10/25/2018   Dilated cardiomyopathy (HCC) 08/01/2016   Demand ischemia of myocardium (HCC)  07/31/2016   Snoring 02/22/2014   Daytime somnolence 02/22/2014   Snores 11/23/2013   Marijuana abuse    Morbid obesity (HCC)    History of acute inferior wall MI 11/20/2013   ST elevation myocardial infarction (STEMI) of true posterior wall, subsequent episode of care [facility]) 11/20/2013   CAD S/P percutaneous coronary angioplasty: DES PCI -Cx-OM1, RCA x 2 11/20/2013    Class: Status post   Essential hypertension    Hyperlipidemia LDL goal <70    HYPERGLYCEMIA 10/25/2009   ASTHMA 10/24/2009   PCP:  [name], MD Pharmacy:   [facility] [address] [place] [address] Phone: [phone] Fax: [phone]  [facility] [address] [place] [address] Phone: [phone] Fax: [phone]  [id number] [place], [place] - [address] AT [facility] & [address] [place] [address] Phone: [phone] Fax: [phone]     Social Drivers of Health (SDOH) Social History: SDOH Screenings   Food Insecurity: No Food Insecurity (03/07/2024)  Housing: Low Risk (03/07/2024)  Transportation Needs: No Transportation Needs (03/07/2024)  Utilities: Not At Risk (03/07/2024)  Depression (PHQ2-9): Low Risk (06/27/2022)  Social Connections: Unknown (03/07/2024)  Tobacco Use: Medium Risk (03/04/2024)   SDOH Interventions:     Readmission Risk Interventions     No data to display

## 2024-03-10 NOTE — Progress Notes (Addendum)
 "   Advanced Heart Failure Rounding Note   Subjective:    Started on milrinone  for low output. Now on milrinone  0.125  Co-ox 66%. CVP 5/6. Diuretics held yesterday. Weight up 3lbs.   Rhythm stable.   Feels good this morning. Denies CP/SOB.   Objective:   Weight Range:  Vital Signs:   Temp:  [97.3 F (36.3 C)-98.7 F (37.1 C)] 97.5 F (36.4 C) (12/23 0300) Pulse Rate:  [75-89] 75 (12/23 0300) Resp:  [17-19] 18 (12/23 0300) BP: (91-132)/(55-99) 91/55 (12/23 0300) SpO2:  [96 %-100 %] 97 % (12/23 0300) Weight:  [121.2 kg] 121.2 kg (12/23 0300) Last BM Date : 03/06/24  Weight change: Filed Weights   03/08/24 0500 03/09/24 0300 03/10/24 0300  Weight: 121.7 kg 120.2 kg 121.2 kg    Intake/Output:   Intake/Output Summary (Last 24 hours) at 03/10/2024 0718 Last data filed at 03/10/2024 0300 Gross per 24 hour  Intake 991.99 ml  Output 1950 ml  Net -958.01 ml     Physical Exam: General:  well appearing.  No respiratory difficulty Neck: JVD ~5 cm.  Cor: Regular rate & rhythm. No murmurs. Lungs: clear, diminished bases Extremities: no edema. PICC RUE Neuro: alert & oriented x 3. Affect pleasant.   Telemetry: NSR 80s Personally reviewed   Labs: Basic Metabolic Panel: Recent Labs  Lab 03/06/24 0328 03/06/24 1549 03/06/24 1700 03/06/24 2257 03/07/24 0500 03/08/24 0500 03/09/24 0500 03/10/24 0530  NA 136  --   --   --  134* 133* 130* 131*  K 3.6  --   --  4.2 4.0 4.5 4.8 5.0  CL 97*  --   --   --  95* 94* 92* 96*  CO2 27  --   --   --  28 28 25 25   GLUCOSE 132*  --   --   --  174* 179* 213* 138*  BUN 17  --   --   --  14 15 24* 24*  CREATININE 1.25*  --   --   --  1.22 1.29* 1.33* 1.19  CALCIUM  9.1  --   --   --  8.6* 8.9 9.3 9.1  MG  --    < > 2.4  --  2.3 2.3 2.6* 2.7*   < > = values in this interval not displayed.    Liver Function Tests: Recent Labs  Lab 03/04/24 1915 03/07/24 0500 03/08/24 0500 03/09/24 0500  AST 24 27 27 29   ALT 21 26 26 27    ALKPHOS 82 81 86 94  BILITOT 4.1* 3.2* 2.7* 1.9*  PROT 8.3* 7.6 8.0 8.9*  ALBUMIN 4.7 4.3 4.4 4.8   Recent Labs  Lab 03/04/24 1915  LIPASE 23   No results for input(s): AMMONIA in the last 168 hours.  CBC: Recent Labs  Lab 03/06/24 0328 03/07/24 0500 03/08/24 0500 03/09/24 0500 03/10/24 0530  WBC 9.9 9.5 9.4 9.2 8.8  HGB 15.1 15.6 16.5 17.5* 17.1*  HCT 44.4 45.3 47.1 51.0 50.5  MCV 93.9 91.9 90.9 90.9 91.7  PLT 264 270 268 317 301    Cardiac Enzymes: No results for input(s): CKTOTAL, CKMB, CKMBINDEX, TROPONINI in the last 168 hours.  BNP: BNP (last 3 results) Recent Labs    08/03/23 1028  BNP 892.8*    ProBNP (last 3 results) Recent Labs    03/04/24 1833  PROBNP 3,698.0*    Other results:  Imaging: No results found.   Medications:   Scheduled Medications:  amiodarone   400 mg Oral BID   apixaban   5 mg Oral BID   atorvastatin   80 mg Oral q1800   Chlorhexidine  Gluconate Cloth  6 each Topical Daily   dapagliflozin  propanediol  10 mg Oral Daily   digoxin   0.125 mg Oral Daily   ezetimibe   10 mg Oral Daily   isosorbide -hydrALAZINE   0.5 tablet Oral TID   mexiletine  300 mg Oral BID   pantoprazole   40 mg Oral Daily   sacubitril -valsartan   1 tablet Oral BID   sodium chloride  flush  10-40 mL Intracatheter Q12H   sodium chloride  flush  3 mL Intravenous Q12H   spironolactone   25 mg Oral Daily   torsemide   20 mg Oral Daily   And   torsemide   40 mg Oral Daily    Infusions:  sodium chloride      milrinone  0.125 mcg/kg/min (03/10/24 0517)    PRN Medications: sodium chloride , acetaminophen  **OR** acetaminophen , HYDROmorphone  (DILAUDID ) injection, methocarbamol , naLOXone  (NARCAN )  injection, nitroGLYCERIN , ondansetron  **OR** ondansetron  (ZOFRAN ) IV, polyethylene glycol, senna-docusate, sodium chloride  flush, sodium chloride  flush Assessment/Plan:   A/C Biventricular HFrEF, 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 Followed in  the HF clinic with previous discussions about advanced therapies. We are still concerned he may need advanced therapies. Suspect abdominal issues are related to low output HF.  Check Blood Type.  NYHA Stage D. - ECHO LVEF < 20% RV severely decreased  - On milrinone  0.125. Co-ox improved. 66% today. Stop milrinone  prior to RHC.  - CVP 5/6 and weight up. Start Torsemide  40am/20pm - Continue digoxin  0.125 mg daily  - Continue Entresto  24-26 mg twice a day. - Continue Spiro 25 mg daily   - Continue Farxiga  10 mg daily - Continue half tab bidil   - Long discussion about potential need for advanced therapies. RHC today.    NSVT/PVCs   - High PVC burden initially, esp while on  - Continue amio 400 mg daily + mexitil  300 mg twice a day. Burden much improved off milrinone .  - Keep K >4 and Mg >2. Stable.  - TSH ok   Abdominal Pain -CT negative. Suspect this is related to low output heart failure.    Elevated TBili -Suspect in the setting of low output HF. Follow daily CMET  T bili 4.1 -> 2.7-> 1.9   CAD  - Strong family history of premature CAD.  H/o inferolateral STEMI in 9/15 with DES to LCx into OM1 and DES to RCA.  LHC 7/23 demonstrated diffuse CAD but no severe stenosis to explain worsening of his EF.   - No s/s angina - Continue atorvastatin  and zetia    H/O PE/DVT - Continue eliquis . No bleeding    Length of Stay: 4   Alma LITTIE Coe AGACNP-BC  03/10/2024, 7:18 AM  Advanced Heart Failure Team Pager [phone] (M-F; 7a - 4p)  Please contact [facility] for night-coverage after hours (4p -7a ) and weekends on [email or web address]   Patient seen and examined with the above-signed Advanced Practice Provider and/or Housestaff. I personally reviewed laboratory data, imaging studies and relevant notes. I independently examined the patient and formulated the important aspects of the plan. I have edited the note to reflect any of my changes or salient points. I have personally discussed the plan  with the patient and/or family.  Had low co-ox yesterday. Back on milrinone . Co-ox back up. CVP ok  General:  Sitting up in bed. No resp difficulty HEENT: normal Neck: supple. no JVD.  Cor:  Regular rate & rhythm. No rubs, gallops or murmurs. Lungs: clear Abdomen: soft, nontender, nondistended.Good bowel sounds. Extremities: no cyanosis, clubbing, rash, edema Neuro: alert & orientedx3, cranial nerves grossly intact. moves all 4 extremities w/o difficulty. Affect pleasant  I worried he may need advanced therapies. Plan RHC today to further evaluate.   Toribio Fuel, MD  10:00 AM  "

## 2024-03-10 NOTE — Progress Notes (Signed)
 " [name]  FMW:[id number] DOB: 08-21-1980 DOA: 03/04/2024 PCP: [name], MD    Chief Complaint  Patient presents with   Chest Pain   Shortness of Breath   Dehydration   Abdominal Pain    Brief Narrative:  Patient 43 year old gentleman history of CAD, hypertension, hyperlipidemia, morbid obesity, history of PE on Eliquis , constipation, medication nonadherence due to financial constraints presented to the ED with abdominal pain, constipation, shortness of breath.  Patient noted on admission to have elevated proBNP, admitted for acute CHF exacerbation and constipation.  Patient placed on IV Lasix .  Cardiology consulted and patient being followed by the heart failure team.  Placed on milrinone .  Patient underwent cardiac catheterization, 03/10/2024.  Heart failure team following and managing.   Assessment & Plan:   Principal Problem:   Acute on chronic systolic heart failure (HCC) Active Problems:   Dilated cardiomyopathy (HCC)   Elevated troponin   Pulmonary emboli (HCC)   Morbid obesity (HCC)   Constipation   Essential hypertension   Hyperlipidemia LDL goal <70   CAD S/P percutaneous coronary angioplasty: DES PCI -Cx-OM1, RCA x 2   Generalized abdominal pain  #1 acute on chronic biventricular systolic heart failure/dilated cardiomyopathy - Patient presented with shortness of breath, abdominal discomfort, medication nonadherence in the past per patient secondary to financial constraints (due to not having insurance). - Repeat 2D echo with EF <20%, severely decreased left ventricular function, left ventricular global hypokinesis, grade 2 DD, severely reduced right ventricular systolic function, normal pulmonary artery systolic pressure, severely dilated left atrial size, mildly dilated right atrial size, trivial MVR. - proBNP elevated at 3698. - Patient initially placed on Lasix  40 mg IV every 12 hours and dose increased to 80 mg IV every 12 hours  and then subsequently increased to 3 times daily with a urine output of 4.6 L over the past 24 hours.  - Patient is -6.5 L during this hospitalization.   - Current weight of 264.99 pounds from 268.4 pounds from 272.4 pounds from 277.89 pounds on admission.  - Due to patient's dilated cardiomyopathy, complicated cardiac history cardiology consulted and patient seen by the advanced heart failure team. - Cardiology recommended PICC placement to check Co ox. - PICC line placed 03/06/2024.. - Patient was on milrinone  and Lasix  80 mg IV 3 times daily. - Per cardiology patient with Co. ox improved at 57% today and milrinone  being weaned down per cardiology on 03/09/2024.   - CVP <5, patient euvolemic on examination.   - IV Lasix  has been discontinued per cardiology and patient started on torsemide  40 mg every morning and 20 mg every afternoon.   - Continue digoxin  0.125 mg daily  - Continue Entresto  24 to 26 mg twice daily  - Continue spironolactone  25 mg daily.   - Continue Farxiga  10 mg daily.   - Patient underwent RHC today, 03/10/2024, which showed low output heart failure due to biventricular dysfunction with normal left-sided filling pressures.   - Cardiology recommending to restart milrinone  and patient being worked up for advanced therapies.   - Cardiology to refer patient to [facility] as outpatient and feel patient would likely need home milrinone  and LifeVest.     -Per cardiology.  2.  NSVT/PVCs -Patient noted to have runs of NSVT on telemetry and noted to have multiple PVCs. - Continue amiodarone  200 mg daily and Mexitil  300 mg twice daily per cardiology. - Keep potassium approximately 4, magnesium  approximately 2.  3.  Abdominal pain/abdominal discomfort/constipation ruled out - Likely secondary to low cardiac output.   - Initially felt secondary to constipation. -CT abdomen and pelvis with no acute abnormalities noted. - Patient however stated he cleansed his bowels prior to  admission. - Patient with significant clinical improvement with diuresis. -Continue treatment as in problem #1. - Follow.  4.  Elevated bilirubin - Felt likely secondary to low cardiac output - Bilirubin improving with diuresis.  -Follow.  5.  History of PE/DVT - Continue Eliquis .    6.  Elevated troponin/CAD -Likely secondary to demand ischemia as troponin levels have plateaued. - Patient denies any chest pain. -Patient with history of CAD with inferolateral STEMI 9/15 with DES to left circumflex into OM1 and DES to RCA.  Patient with family history of premature CAD. -Patient with LHC 7/23 that showed diffuse CAD but no severe stenosis noted. -Cardiology following.  7.  Hyperlipidemia - Continue atorvastatin  80 mg daily, ezetimibe  10 mg daily.  8.  Hypertension - Continue spironolactone , Entresto , BiDil , Demadex .     9.  Morbid obesity -Lifestyle modification. - Outpatient follow-up with PCP.   DVT prophylaxis: Eliquis  Code Status: Full Family Communication: Updated patient.  No family at bedside. Disposition: Likely home when clinically improved and cleared by cardiology.  Status is: Inpatient Remains inpatient appropriate because: Severity of illness   Consultants:  Cardiology/advanced heart failure team: Dr. Zenaida 05/08/2023  Procedures:  CT angiogram chest 03/04/2024 CT abdomen and pelvis 03/04/2024 2D echo 05/07/2023 PICC line placement 03/06/2024 Cardiac catheterization 03/10/2024  Antimicrobials:  Anti-infectives (From admission, onward)    None         Subjective: Patient sitting up eating his lunch.  Denies any chest pain or shortness of breath.  No abdominal discomfort.  Just returned from cardiac catheterization.   Objective: Vitals:   03/10/24 1031 03/10/24 1055 03/10/24 1100 03/10/24 1103  BP: 133/84 (!) 174/136 (!) 180/99 (!) 150/109  Pulse: 76 81 76 76  Resp: 10 20 20    Temp:  (!) 97.3 F (36.3 C)    TempSrc:  Oral    SpO2: 98% 100%  100% 99%  Weight:      Height:        Intake/Output Summary (Last 24 hours) at 03/10/2024 1248 Last data filed at 03/10/2024 0900 Gross per 24 hour  Intake 631.99 ml  Output 1650 ml  Net -1018.01 ml   Filed Weights   03/08/24 0500 03/09/24 0300 03/10/24 0300  Weight: 121.7 kg 120.2 kg 121.2 kg    Examination:  General exam: NAD. Respiratory system: CTAB.  No significant crackles noted.  No rhonchi.  No wheezing.  No use of accessory muscles of respiration.  Fair air movement.  No significant lower extremity edema.  Cardiovascular system: Regular rate rhythm no murmurs rubs or gallops.  No JVD.  No lower extremity edema. Gastrointestinal system: Abdomen soft, nontender, nondistended, positive bowel sounds.  No rebound.  No guarding.  Central nervous system: Alert and oriented.  Moving extremities spontaneously no focal neurological deficits. Extremities: No lower extremity edema.  Skin: No rashes, lesions or ulcers Psychiatry: Judgement and insight appear normal. Mood & affect appropriate.     Data Reviewed: I have personally reviewed following labs and imaging studies  CBC: Recent Labs  Lab 03/06/24 0328 03/07/24 0500 03/08/24 0500 03/09/24 0500 03/10/24 0530  WBC 9.9 9.5 9.4 9.2 8.8  HGB 15.1 15.6 16.5 17.5* 17.1*  HCT 44.4 45.3 47.1 51.0 50.5  MCV 93.9 91.9 90.9  90.9 91.7  PLT 264 270 268 317 301    Basic Metabolic Panel: Recent Labs  Lab 03/06/24 0328 03/06/24 1549 03/06/24 1700 03/06/24 2257 03/07/24 0500 03/08/24 0500 03/09/24 0500 03/10/24 0530  NA 136  --   --   --  134* 133* 130* 131*  K 3.6  --   --  4.2 4.0 4.5 4.8 5.0  CL 97*  --   --   --  95* 94* 92* 96*  CO2 27  --   --   --  28 28 25 25   GLUCOSE 132*  --   --   --  174* 179* 213* 138*  BUN 17  --   --   --  14 15 24* 24*  CREATININE 1.25*  --   --   --  1.22 1.29* 1.33* 1.19  CALCIUM  9.1  --   --   --  8.6* 8.9 9.3 9.1  MG  --    < > 2.4  --  2.3 2.3 2.6* 2.7*   < > = values in this  interval not displayed.    GFR: Estimated Creatinine Clearance: 110.7 mL/min (by C-G formula based on SCr of 1.19 mg/dL).  Liver Function Tests: Recent Labs  Lab 03/04/24 1915 03/07/24 0500 03/08/24 0500 03/09/24 0500  AST 24 27 27 29   ALT 21 26 26 27   ALKPHOS 82 81 86 94  BILITOT 4.1* 3.2* 2.7* 1.9*  PROT 8.3* 7.6 8.0 8.9*  ALBUMIN 4.7 4.3 4.4 4.8    CBG: No results for input(s): GLUCAP in the last 168 hours.   Recent Results (from the past 240 hours)  Resp panel by RT-PCR (RSV, Flu A&B, Covid) Anterior Nasal Swab     Status: None   Collection Time: 03/04/24  6:33 PM   Specimen: Anterior Nasal Swab  Result Value Ref Range Status   SARS Coronavirus 2 by RT PCR NEGATIVE NEGATIVE Final    Comment: (NOTE) SARS-CoV-2 target nucleic acids are NOT DETECTED.  The SARS-CoV-2 RNA is generally detectable in upper respiratory specimens during the acute phase of infection. The lowest concentration of SARS-CoV-2 viral copies this assay can detect is 138 copies/mL. A negative result does not preclude SARS-Cov-2 infection and should not be used as the sole basis for treatment or other patient management decisions. A negative result may occur with  improper specimen collection/handling, submission of specimen other than nasopharyngeal swab, presence of viral mutation(s) within the areas targeted by this assay, and inadequate number of viral copies(<138 copies/mL). A negative result must be combined with clinical observations, patient history, and epidemiological information. The expected result is Negative.  Fact Sheet for Patients:  bloggercourse.com  Fact Sheet for Healthcare Providers:  seriousbroker.it  This test is no t yet approved or cleared by the [place]  FDA and  has been authorized for detection and/or diagnosis of SARS-CoV-2 by FDA under an Emergency Use Authorization (EUA). This EUA will remain  in effect  (meaning this test can be used) for the duration of the COVID-19 declaration under Section 564(b)(1) of the Act, 21 U.S.C.section 360bbb-3(b)(1), unless the authorization is terminated  or revoked sooner.       Influenza A by PCR NEGATIVE NEGATIVE Final   Influenza B by PCR NEGATIVE NEGATIVE Final    Comment: (NOTE) The Xpert Xpress SARS-CoV-2/FLU/RSV plus assay is intended as an aid in the diagnosis of influenza from Nasopharyngeal swab specimens and should not be used as a sole basis for treatment. Nasal  washings and aspirates are unacceptable for Xpert Xpress SARS-CoV-2/FLU/RSV testing.  Fact Sheet for Patients: bloggercourse.com  Fact Sheet for Healthcare Providers: seriousbroker.it  This test is not yet approved or cleared by the [place]  FDA and has been authorized for detection and/or diagnosis of SARS-CoV-2 by FDA under an Emergency Use Authorization (EUA). This EUA will remain in effect (meaning this test can be used) for the duration of the COVID-19 declaration under Section 564(b)(1) of the Act, 21 U.S.C. section 360bbb-3(b)(1), unless the authorization is terminated or revoked.     Resp Syncytial Virus by PCR NEGATIVE NEGATIVE Final    Comment: (NOTE) Fact Sheet for Patients: bloggercourse.com  Fact Sheet for Healthcare Providers: seriousbroker.it  This test is not yet approved or cleared by the [place]  FDA and has been authorized for detection and/or diagnosis of SARS-CoV-2 by FDA under an Emergency Use Authorization (EUA). This EUA will remain in effect (meaning this test can be used) for the duration of the COVID-19 declaration under Section 564(b)(1) of the Act, 21 U.S.C. section 360bbb-3(b)(1), unless the authorization is terminated or revoked.  Performed at [facility], [address] [place], [place] [address]           Radiology Studies: VAS US  LOWER EXTREMITY VENOUS (DVT) Result Date: 03/10/2024  Lower Venous DVT Study Patient Name:  Reginald Fernandez  Date of Exam:   03/10/2024 Medical Rec #: [id number]       Accession #:    [id number] Date of Birth: 1980-10-15       Patient Gender: M Patient Age:   19 years Exam Location:  [facility] Procedure:      VAS US  LOWER EXTREMITY VENOUS (DVT) Referring Phys: TORIBIO BENSIMHON --------------------------------------------------------------------------------  Indications: Pre-VAD.  Risk Factors: None identified. Comparison Study: No prior studies. Performing Technologist: Cordella Collet RVT  Examination Guidelines: A complete evaluation includes B-mode imaging, spectral Doppler, color Doppler, and power Doppler as needed of all accessible portions of each vessel. Bilateral testing is considered an integral part of a complete examination. Limited examinations for reoccurring indications may be performed as noted. The reflux portion of the exam is performed with the patient in reverse Trendelenburg.  +---------+---------------+---------+-----------+----------+--------------+ RIGHT    CompressibilityPhasicitySpontaneityPropertiesThrombus Aging +---------+---------------+---------+-----------+----------+--------------+ CFV      Full           Yes      Yes                                 +---------+---------------+---------+-----------+----------+--------------+ SFJ      Full                                                        +---------+---------------+---------+-----------+----------+--------------+ FV Prox  Full                                                        +---------+---------------+---------+-----------+----------+--------------+ FV Mid   Full                                                        +---------+---------------+---------+-----------+----------+--------------+  FV DistalFull                                                         +---------+---------------+---------+-----------+----------+--------------+ PFV      Full                                                        +---------+---------------+---------+-----------+----------+--------------+ POP      Full           Yes      Yes                                 +---------+---------------+---------+-----------+----------+--------------+ PTV      Full                                                        +---------+---------------+---------+-----------+----------+--------------+ PERO     Full                                                        +---------+---------------+---------+-----------+----------+--------------+   +---------+---------------+---------+-----------+----------+--------------+ LEFT     CompressibilityPhasicitySpontaneityPropertiesThrombus Aging +---------+---------------+---------+-----------+----------+--------------+ CFV      Full           Yes      Yes                                 +---------+---------------+---------+-----------+----------+--------------+ SFJ      Full                                                        +---------+---------------+---------+-----------+----------+--------------+ FV Prox  Full                                                        +---------+---------------+---------+-----------+----------+--------------+ FV Mid   Full                                                        +---------+---------------+---------+-----------+----------+--------------+ FV DistalFull                                                        +---------+---------------+---------+-----------+----------+--------------+  PFV      Full                                                        +---------+---------------+---------+-----------+----------+--------------+ POP      Full           Yes      Yes                                  +---------+---------------+---------+-----------+----------+--------------+ PTV      Full                                                        +---------+---------------+---------+-----------+----------+--------------+ PERO     Full                                                        +---------+---------------+---------+-----------+----------+--------------+     Summary: RIGHT: - There is no evidence of deep vein thrombosis in the lower extremity.  - No cystic structure found in the popliteal fossa.  LEFT: - There is no evidence of deep vein thrombosis in the lower extremity.  - No cystic structure found in the popliteal fossa.  *See table(s) above for measurements and observations.    Preliminary    CARDIAC CATHETERIZATION Result Date: 03/10/2024 Findings: RA = 6 RV = 38/5 PA = 36/22 (29) PCW =12 Fick cardiac output/index = 4.8/1.9 Thermo CO/CI = 4.3/1.7 PVR = 3.5 WU (Fick) 3.9 (TD) Ao sat = 94% PA sat = 69%, 70% PAPi = 2.3 Assessment: 1. Low output HF due to biventricular dysfunction with normal left siding filling pressures Plan/Discussion: Restart milrinone . Being w/u for advanced therapies (transplant). Will refer to [facility] as outpatient. Will likely need home milrinone  and LifeVest Icholas Irby Bensimhon, MD 10:44 AM        Scheduled Meds:  amiodarone   400 mg Oral BID   apixaban   5 mg Oral BID   atorvastatin   80 mg Oral q1800   Chlorhexidine  Gluconate Cloth  6 each Topical Daily   dapagliflozin  propanediol  10 mg Oral Daily   digoxin   0.125 mg Oral Daily   ezetimibe   10 mg Oral Daily   isosorbide -hydrALAZINE   0.5 tablet Oral TID   mexiletine  300 mg Oral BID   pantoprazole   40 mg Oral Daily   sacubitril -valsartan   1 tablet Oral BID   sodium chloride  flush  10-40 mL Intracatheter Q12H   sodium chloride  flush  3 mL Intravenous Q12H   spironolactone   25 mg Oral Daily   torsemide   20 mg Oral Daily   And   torsemide   40 mg Oral Daily   Continuous Infusions:  sodium  chloride     milrinone  0.125 mcg/kg/min (03/10/24 1117)     LOS: 4 days    Time spent: 40 minutes    Toribio Hummer, MD [facility]   To contact the attending provider between 7A-7P or the covering  provider during after hours 7P-7A, please log into the web site [email or web address] and access using universal [place] password for that web site. If you do not have the password, please call the hospital operator.  03/10/2024, 12:48 PM    "

## 2024-03-10 NOTE — Progress Notes (Signed)
 Pre-VAD testing has been completed. Preliminary results can be found in CV Proc through chart review.   03/10/2024 2:30 PM Cathlyn Collet RVT

## 2024-03-10 NOTE — Consult Note (Signed)
 "   [address], [place] [address]             [phone]    [facility] Medical Record #[id number] Date of Birth: 1981-01-31  Referring: No ref. provider found Primary Care: [name], MD Primary Cardiologist:David Anner, MD  Chief Complaint:    Chief Complaint  Patient presents with   Chest Pain   Shortness of Breath   Dehydration   Abdominal Pain    History of Present Illness:     Reginald Fernandez is a 43 y.o. male who presents for surgical evaluation of Heartmate 3.  He has a history of severe biventricular heart failure and was recently admitted with GI discomfort, early satiety and constipation.  He was previously admitted in May and started on IV milrinone , he subsequently did well but no-showed multiple appointments and now is re-admitted with low output heart failure.  He underwent RHC yesterday which showed normal filling pressures but low output.  He has been feeling better on 0.125 mg milrinone .   Currently he looks much better than it sounds like he did on admission.  He is sitting up in bed, in no distress with no oxygen.  He is engaged in conversation about LVAD and what surgery would entail.  We discussed the expected peri- and post-op care around LVAD implantation.  He seemed a little overwhelmed but overall asked good questions.  He is amenable to LVAD but it is not his first choice - he says he would prefer transplant but he feels good so isn't sure he needs anything right now.  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    He is an active vaper, occasional marijuana and alcohol use.  He denies any other illicit drug use.  RHC (03/10/24):  RA = 6 RV = 38/5 PA = 36/22 (29)  PCW =12 Fick cardiac output/index = 4.8/1.9  Thermo CO/CI = 4.3/1.7 PVR = 3.5 WU (Fick) 3.9  (TD) Ao sat = 94% PA sat = 69%, 70% PAPi = 2.3 TTE (03/05/24): Severe biventricular heart failure (LVEF < 20%, RV function severely reduced), no AI or AS, trivial MR, no TR, no ASD Chest CT (03/04/24): No significant aortic calcium  PFT (TBD): pending  Past Medical and Surgical History: Previous Chest Surgery: No Previous Chest Radiation: No Diabetes Mellitus: No.  HbA1C N/A Anticoagulation: Eliquis  (DVT/PE), Last dose: Ongoing  Creatinine:  Lab Results  Component Value Date   CREATININE 1.19 03/10/2024   CREATININE 1.33 (H) 03/09/2024   CREATININE 1.29 (H) 03/08/2024     Past Medical History:  Diagnosis Date   CAD S/P percutaneous coronary angioplasty 11/20/2013   a. 09/2009: EKG with Inf STEMI - no obstructive CAD;  b. 11/2013 Inflat STEMI/PCI: LM nl, LAD 20p, D1 sm - diff dzs, D2 large - nl, LCX 95-99, OM1 100 (3.5x38 Promus Premier DES), RCA 95-56m, 80d (3.0x20 and 3.0x16 Promus Premier DES') - normal EF;  c. NSTEMI 5/18 - patent stents, otw minimal CAD.- EF by Echo 35-40%; d. NSTEMI 10/2018 - ? culprit - Small branch of  D2,Med Rx,.  EF by Echo ~25%   Cardiomyopathy, ischemic 07/2016   h/o Inf STEMI 11/2013 - EF was Normal; b) NSTEMI 07/2016 (NO Cultprit on Cath) - Echo EF 35-40% (Basal Ant-Lat HK, basal-mid Inferolateral &  apical Akinesis); c) NSTEMI 10/2018 -felt to be occluded small branch of D2, Echo EF further decreased to 20-25%.    Essential hypertension    Hyperlipidemia with target LDL less than 100    Marijuana abuse    Migraine    Morbid obesity (HCC)    ST-segment elevation myocardial infarction (STEMI) of inferior wall (HCC) 11/20/2013    Past Surgical History:  Procedure Laterality Date   LEFT HEART CATH AND CORONARY ANGIOGRAPHY N/A 08/01/2016   Procedure: Left Heart Cath and Coronary Angiography;  Surgeon: Wonda Sharper, MD;  Location: [facility] INVASIVE CV LAB;;  Stable 2 V CAD - patent RCA & OM stents. Diffuse, non-obstructive CAD elsewhere. Moderately elevated  LVEDP --> No culprit lesion  => restarted Plavix  & titrate Med Rx.   LEFT HEART CATH AND CORONARY ANGIOGRAPHY N/A 10/24/2018   Procedure: LEFT HEART CATH AND CORONARY ANGIOGRAPHY;  Surgeon: Burnard Debby LABOR, MD;  Location: [facility] INVASIVE CV LAB;  60% small D1, D2 has 3 branches 1 of which is small and appears to be 100%, LCx-OM widely patent with mild 55% stenosis in the jailed AV groove LCx. RCA has mild luminal irregularities 30% proximal with widely patent stents in the mid and distal RCA. (Culprit 100% small branch of D2)   LEFT HEART CATH AND CORONARY ANGIOGRAPHY  09/2009   (Presumably in the setting of STEMI) mild RCA luminal Irregularities - MIld INf HK -- Med Rx; b)   LEFT HEART CATHETERIZATION WITH CORONARY ANGIOGRAM N/A 11/20/2013   Procedure: LEFT HEART CATHETERIZATION WITH CORONARY ANGIOGRAM;  Surgeon: Alm LELON Clay, MD;  Location: [facility] CATH LAB;  Service: Cardiovascular;   Inf STEMI: OM1 100%, m-dRCA 95-99% thrombotic mRCA with tandem 80% -- PCI, LAD & Cx relatively normal.   PERCUTANEOUS CORONARY STENT INTERVENTION (PCI-S)  11/20/2013   a) PCI - OM1 Promus Premier DES 3.5 mm x 38 mm, m-d RCA Promus Premier DES  3.0 mm x 20 mm & 3.0 mm x 16 mm   RIGHT HEART CATH N/A 08/09/2023   Procedure: RIGHT HEART CATH;  Surgeon: Zenaida Morene PARAS, MD;  Location: [facility] INVASIVE CV LAB;  Service: Cardiovascular;  Laterality: N/A;   RIGHT/LEFT HEART CATH AND CORONARY ANGIOGRAPHY N/A 10/11/2021   Procedure: RIGHT/LEFT HEART CATH AND CORONARY ANGIOGRAPHY;  Surgeon: Rolan Ezra RAMAN, MD;  Location: [facility] INVASIVE CV LAB;  Service: Cardiovascular;  Laterality: N/A;   TRANSTHORACIC ECHOCARDIOGRAM  11/20/2013   LV EF 55-60%. No regional wall motion abnormality.  Normal valves    TRANSTHORACIC ECHOCARDIOGRAM  08/11/2016   (Non-STEMI-no culprit lesion): Moderately dilated LV.  Basal anterolateral HK with basal-mid inferolateral akinesis.  Mild-apical inferior akinesis.  EF 35 to 40%.  GRII DD.  Mild biatrial dilation.   Normal RV size and function. Mostly normal valves.   TRANSTHORACIC ECHOCARDIOGRAM  10/24/2018   (Non-STEMI-culprit lesion was 1 of 3 small 2 branches.)  EF 20-25%.  Mild mildly dilated LV.  GR 2 DD.  Mild biatrial dilation.  Normal PA pressures.    Social History:  Tobacco Use History[1]  Social History   Substance and Sexual Activity  Alcohol Use No   Alcohol/week: 0.0 standard drinks of alcohol     Allergies[2]   Current Facility-Administered Medications  Medication Dose Route Frequency Provider Last Rate Last Admin   0.9 %  sodium chloride  infusion  250 mL Intravenous PRN Bensimhon, Daniel R, MD       acetaminophen  (TYLENOL ) tablet 650 mg  650 mg Oral Q4H PRN Bensimhon, Toribio  R, MD       amiodarone  (PACERONE ) tablet 400 mg  400 mg Oral BID Bensimhon, Daniel R, MD   400 mg at 03/10/24 9083   apixaban  (ELIQUIS ) tablet 5 mg  5 mg Oral BID Bensimhon, Daniel R, MD   5 mg at 03/10/24 1108   atorvastatin  (LIPITOR ) tablet 80 mg  80 mg Oral q1800 Bensimhon, Daniel R, MD   80 mg at 03/09/24 1635   Chlorhexidine  Gluconate Cloth 2 % PADS 6 each  6 each Topical Daily Bensimhon, Toribio SAUNDERS, MD   6 each at 03/09/24 1103   dapagliflozin  propanediol (FARXIGA ) tablet 10 mg  10 mg Oral Daily Bensimhon, Toribio SAUNDERS, MD   10 mg at 03/10/24 1108   digoxin  (LANOXIN ) tablet 0.125 mg  0.125 mg Oral Daily Bensimhon, Daniel R, MD   0.125 mg at 03/10/24 9083   ezetimibe  (ZETIA ) tablet 10 mg  10 mg Oral Daily Bensimhon, Toribio SAUNDERS, MD   10 mg at 03/10/24 1108   hydrALAZINE  (APRESOLINE ) injection 10 mg  10 mg Intravenous Q20 Min PRN Bensimhon, Toribio SAUNDERS, MD       HYDROmorphone  (DILAUDID ) injection 0.5 mg  0.5 mg Intravenous Q2H PRN Bensimhon, Daniel R, MD   0.5 mg at 03/09/24 2217   isosorbide -hydrALAZINE  (BIDIL ) 20-37.5 MG per tablet 0.5 tablet  0.5 tablet Oral TID Bensimhon, Daniel R, MD   0.5 tablet at 03/10/24 1107   methocarbamol  (ROBAXIN ) tablet 500 mg  500 mg Oral Q8H PRN Bensimhon, Toribio SAUNDERS, MD   500 mg at  03/09/24 2128   mexiletine (MEXITIL ) capsule 300 mg  300 mg Oral BID Bensimhon, Daniel R, MD   300 mg at 03/10/24 1108   milrinone  (PRIMACOR ) 20 MG/100 ML (0.2 mg/mL) infusion  0.125 mcg/kg/min (Order-Specific) Intravenous Continuous Bensimhon, Toribio SAUNDERS, MD 4.72 mL/hr at 03/10/24 1117 0.125 mcg/kg/min at 03/10/24 1117   naloxone  (NARCAN ) injection 0.4 mg  0.4 mg Intravenous PRN Bensimhon, Toribio SAUNDERS, MD       nitroGLYCERIN  (NITROSTAT ) SL tablet 0.4 mg  0.4 mg Sublingual Q5 min PRN Bensimhon, Toribio SAUNDERS, MD       ondansetron  (ZOFRAN ) injection 4 mg  4 mg Intravenous Q6H PRN Bensimhon, Toribio SAUNDERS, MD       pantoprazole  (PROTONIX ) EC tablet 40 mg  40 mg Oral Daily Bensimhon, Daniel R, MD   40 mg at 03/10/24 1107   polyethylene glycol (MIRALAX  / GLYCOLAX ) packet 17 g  17 g Oral Daily PRN Bensimhon, Daniel R, MD   17 g at 03/07/24 2144   sacubitril -valsartan  (ENTRESTO ) 24-26 mg per tablet  1 tablet Oral BID Bensimhon, Daniel R, MD   1 tablet at 03/10/24 9083   senna-docusate (Senokot-S) tablet 1 tablet  1 tablet Oral QHS PRN Bensimhon, Toribio SAUNDERS, MD       sodium chloride  flush (NS) 0.9 % injection 10-40 mL  10-40 mL Intracatheter Q12H Bensimhon, Toribio SAUNDERS, MD   10 mL at 03/09/24 2129   sodium chloride  flush (NS) 0.9 % injection 10-40 mL  10-40 mL Intracatheter PRN Bensimhon, Toribio SAUNDERS, MD       sodium chloride  flush (NS) 0.9 % injection 3 mL  3 mL Intravenous Q12H Bensimhon, Toribio SAUNDERS, MD   3 mL at 03/10/24 1105   sodium chloride  flush (NS) 0.9 % injection 3 mL  3 mL Intravenous PRN Bensimhon, Toribio SAUNDERS, MD       spironolactone  (ALDACTONE ) tablet 25 mg  25 mg Oral Daily Bensimhon, Toribio SAUNDERS, MD  25 mg at 03/10/24 1107   torsemide  (DEMADEX ) tablet 20 mg  20 mg Oral Daily Bensimhon, Toribio SAUNDERS, MD       And   torsemide  (DEMADEX ) tablet 40 mg  40 mg Oral Daily Bensimhon, Daniel R, MD        Medications Prior to Admission  Medication Sig Dispense Refill Last Dose/Taking   acetaminophen  (TYLENOL ) 325 MG tablet Take 2  tablets (650 mg total) by mouth every 6 (six) hours as needed for mild pain (or Fever >/= 101).   Past Week   amiodarone  (PACERONE ) 200 MG tablet Take 2 tablets (400 mg total) by mouth 2 (two) times daily for 5 days, THEN 2 tablets (400 mg total) daily for 7 days, THEN 1 tablet (200 mg total) daily. 94 tablet 0 02/26/2024   apixaban  (ELIQUIS ) 5 MG TABS tablet Take 2 tablets (10 mg total) by mouth 2 (two) times daily for 1 day, THEN 1 tablet (5 mg total) 2 (two) times daily. 182 tablet 0 02/26/2024   atorvastatin  (LIPITOR ) 80 MG tablet Take 1 tablet (80 mg total) by mouth daily at 6 PM 90 tablet 1 02/26/2024   dapagliflozin  propanediol (FARXIGA ) 10 MG TABS tablet Take 1 tablet (10 mg total) by mouth daily. 90 tablet 3 02/26/2024   digoxin  (LANOXIN ) 0.125 MG tablet Take 1 tablet (0.125 mg total) by mouth daily. 90 tablet 3 02/26/2024   docusate sodium  (COLACE) 100 MG capsule Take 1 capsule (100 mg total) by mouth every 12 (twelve) hours. (Patient taking differently: Take 100 mg by mouth as needed.) 60 capsule 0 02/26/2024   ezetimibe  (ZETIA ) 10 MG tablet Take 1 tablet (10 mg total) by mouth daily. 30 tablet 1 02/26/2024   mexiletine (MEXITIL ) 150 MG capsule Take 2 capsules (300 mg total) by mouth 2 (two) times daily. 120 capsule 1 02/26/2024   nitroGLYCERIN  (NITROSTAT ) 0.4 MG SL tablet Place 1 tablet (0.4 mg total) under the tongue every 5 (five) minutes as needed for chest pain. 25 tablet 2 Taking As Needed   pantoprazole  (PROTONIX ) 40 MG tablet Take 1 tablet (40 mg total) by mouth daily. 30 tablet 1 02/26/2024   sacubitril -valsartan  (ENTRESTO ) 24-26 MG Take 1 tablet by mouth 2 (two) times daily.   02/26/2024   senna-docusate (SENOKOT-S) 8.6-50 MG tablet Take 1 tablet by mouth 2 (two) times daily.   Past Week   spironolactone  (ALDACTONE ) 25 MG tablet Take 1 tablet (25 mg total) by mouth daily. 30 tablet 5 02/26/2024   torsemide  (DEMADEX ) 20 MG tablet Take 1 tablet (20 mg total) by mouth daily. (Patient  taking differently: Take 20 mg by mouth 2 (two) times daily.) 30 tablet 2 02/26/2024   traMADol  (ULTRAM ) 50 MG tablet Take 1 tablet (50 mg total) by mouth every 6 (six) hours as needed. (Patient taking differently: Take 50 mg by mouth every 6 (six) hours as needed for moderate pain (pain score 4-6).) 15 tablet 0 Past Month   colchicine  0.6 MG tablet Take 1 tablet (0.6 mg total) by mouth daily for 7 days. 7 tablet 0     Family History  Problem Relation Age of Onset   Hypertension Mother    Heart attack Father 18       2 MIs by age 50 (first at 90)-- CABG   Hypertension Father    Heart failure Father    Hyperlipidemia Father    Heart attack Maternal Grandfather        6's   Heart attack Paternal  Grandfather        60's   Heart attack Other      Review of Systems:   Review of Systems  Constitutional:  Positive for malaise/fatigue. Negative for weight loss.  Respiratory:  Positive for shortness of breath.   Cardiovascular:  Positive for leg swelling. Negative for chest pain.  Gastrointestinal:  Positive for constipation and nausea.  Neurological:  Negative for dizziness and headaches.      Physical Exam: BP (!) 150/109 (BP Location: Left Arm)   Pulse 76   Temp (!) 97.3 F (36.3 C) (Oral)   Resp 20   Ht 6' 2 (1.88 m)   Wt 121.2 kg   SpO2 99%   BMI 34.31 kg/m  Physical Exam Constitutional:      Appearance: He is well-developed.  Cardiovascular:     Rate and Rhythm: Normal rate and regular rhythm.  Pulmonary:     Effort: Pulmonary effort is normal.  Chest:     Chest wall: No deformity.  Abdominal:     Palpations: Abdomen is soft.  Musculoskeletal:     Right lower leg: No edema.     Left lower leg: No edema.  Neurological:     Mental Status: He is alert.       Diagnostic Studies & Laboratory data: Cardiac Studies & Procedures   ______________________________________________________________________________________________ CARDIAC CATHETERIZATION  CARDIAC  CATHETERIZATION 03/10/2024  Conclusion Findings:  RA = 6 RV = 38/5 PA = 36/22 (29) PCW =12 Fick cardiac output/index = 4.8/1.9 Thermo CO/CI = 4.3/1.7 PVR = 3.5 WU (Fick) 3.9 (TD) Ao sat = 94% PA sat = 69%, 70% PAPi = 2.3  Assessment: 1. Low output HF due to biventricular dysfunction with normal left siding filling pressures  Plan/Discussion:  Restart milrinone . Being w/u for advanced therapies (transplant). Will refer to [facility] as outpatient. Will likely need home milrinone  and LifeVest  Toribio Fuel, MD 10:44 AM   CARDIAC CATHETERIZATION  CARDIAC CATHETERIZATION 08/09/2023  Conclusion HEMODYNAMICS: RA:       12 mmHg (mean) RV:       60/5, 12 mmHg PA:       60/40 mmHg (47 mean) PCWP: 35 mmHg (mean)  Estimated Fick CO/CI   3.08L/min, 1.27L/min/m2 Thermodilution CO/CI   4.11L/min, 1.7L/min/m2  TPG  12  mmHg PVR  2.9 Wood Units PAPi  1.7 SVR: 1810, 2415 dynes/sec/cm-5   IMPRESSION: Right heart catheterization for acute on chronic systolic heart failure. Severely elevated left sided filling pressures Severely reduced cardiac output by assumed fick, mildly improved with TD Moderate Group II precapillary PH Elevated SVR, did not receive AM medications  RECOMMENDATIONS: Uptitrate GDMT aggressively, follow coox from PICC Start LVAD/advanced therapies workup     ECHOCARDIOGRAM  ECHOCARDIOGRAM COMPLETE 03/05/2024  Narrative ECHOCARDIOGRAM REPORT    Patient Name:   Reginald Fernandez Date of Exam: 03/05/2024 Medical Rec #:  [id number]      Height:       74.0 in Accession #:    [id number]     Weight:       277.9 lb Date of Birth:  11-Aug-1980      BSA:          2.500 m Patient Age:    43 years       BP:           151/106 mmHg Patient Gender: M              HR:  94 bpm. Exam Location:  Inpatient  Procedure: 2D Echo, Cardiac Doppler, Color Doppler and Intracardiac Opacification Agent (Both Spectral and Color Flow Doppler were utilized during  procedure).  Indications:    Dyspnea Elevated Troponin  History:        Patient has prior history of Echocardiogram examinations, most recent 08/04/2023. CHF and Cardiomyopathy, CAD and Previous Myocardial Infarction; Risk Factors:Hypertension, Dyslipidemia and Former Smoker.  Sonographer:    Juliene Rucks Referring Phys: [id number] AMY N COX   Sonographer Comments: Patient is obese. IMPRESSIONS   1. Left ventricular ejection fraction, by estimation, is <20%. The left ventricle has severely decreased function. The left ventricle demonstrates global hypokinesis. The left ventricular internal cavity size was moderately dilated. Left ventricular diastolic parameters are consistent with Grade II diastolic dysfunction (pseudonormalization). 2. Right ventricular systolic function is severely reduced. The right ventricular size is normal. There is normal pulmonary artery systolic pressure. 3. Left atrial size was severely dilated. 4. Right atrial size was mildly dilated. 5. The mitral valve is normal in structure. Trivial mitral valve regurgitation. No evidence of mitral stenosis. 6. The aortic valve is tricuspid. Aortic valve regurgitation is not visualized. No aortic stenosis is present. 7. The inferior vena cava is dilated in size with >50% respiratory variability, suggesting right atrial pressure of 8 mmHg.  FINDINGS Left Ventricle: Left ventricular ejection fraction, by estimation, is <20%. The left ventricle has severely decreased function. The left ventricle demonstrates global hypokinesis. Definity  contrast agent was given IV to delineate the left ventricular endocardial borders. The left ventricular internal cavity size was moderately dilated. There is no left ventricular hypertrophy. Left ventricular diastolic parameters are consistent with Grade II diastolic dysfunction (pseudonormalization).  Right Ventricle: The right ventricular size is normal. No increase in right ventricular wall  thickness. Right ventricular systolic function is severely reduced. There is normal pulmonary artery systolic pressure. The tricuspid regurgitant velocity is 1.47 m/s, and with an assumed right atrial pressure of 8 mmHg, the estimated right ventricular systolic pressure is 16.6 mmHg.  Left Atrium: Left atrial size was severely dilated.  Right Atrium: Right atrial size was mildly dilated.  Pericardium: Trivial pericardial effusion is present.  Mitral Valve: The mitral valve is normal in structure. Trivial mitral valve regurgitation. No evidence of mitral valve stenosis.  Tricuspid Valve: The tricuspid valve is normal in structure. Tricuspid valve regurgitation is trivial. No evidence of tricuspid stenosis.  Aortic Valve: The aortic valve is tricuspid. Aortic valve regurgitation is not visualized. No aortic stenosis is present.  Pulmonic Valve: The pulmonic valve was normal in structure. Pulmonic valve regurgitation is not visualized. No evidence of pulmonic stenosis.  Aorta: The aortic root is normal in size and structure.  Venous: The inferior vena cava is dilated in size with greater than 50% respiratory variability, suggesting right atrial pressure of 8 mmHg.  IAS/Shunts: No atrial level shunt detected by color flow Doppler.   LEFT VENTRICLE PLAX 2D LVIDd:         6.50 cm   Diastology LVIDs:         5.90 cm   LV e' medial:  6.45 cm/s LV PW:         1.00 cm   LV e' lateral: 6.68 cm/s LV IVS:        1.00 cm LVOT diam:     2.00 cm LVOT Area:     3.14 cm   RIGHT VENTRICLE            IVC RV Basal  diam:  3.30 cm    IVC diam: 2.30 cm RV Mid diam:    3.10 cm RV S prime:     9.75 cm/s TAPSE (M-mode): 1.2 cm  LEFT ATRIUM            Index        RIGHT ATRIUM           Index LA diam:      5.90 cm  2.36 cm/m   RA Area:     25.20 cm LA Vol (A2C): 114.0 ml 45.61 ml/m  RA Volume:   83.00 ml  33.21 ml/m  AORTA Ao Root diam: 3.00 cm Ao Asc diam:  3.20 cm  TRICUSPID VALVE TR Peak  grad:   8.6 mmHg TR Vmax:        147.00 cm/s  SHUNTS Systemic Diam: 2.00 cm  Annabella Scarce MD Electronically signed by Annabella Scarce MD Signature Date/Time: 03/05/2024/6:08:00 PM    Final          ______________________________________________________________________________________________     EKG: NSR I have independently reviewed the above radiologic studies and discussed with the patient   Recent Lab Findings: Lab Results  Component Value Date   WBC 8.8 03/10/2024   HGB 17.1 (H) 03/10/2024   HCT 50.5 03/10/2024   PLT 301 03/10/2024   GLUCOSE 138 (H) 03/10/2024   CHOL 104 08/10/2023   TRIG 92 08/10/2023   HDL 28 (L) 08/10/2023   LDLCALC 58 08/10/2023   ALT 27 03/09/2024   AST 29 03/09/2024   NA 131 (L) 03/10/2024   K 5.0 03/10/2024   CL 96 (L) 03/10/2024   CREATININE 1.19 03/10/2024   BUN 24 (H) 03/10/2024   CO2 25 03/10/2024   TSH 2.140 08/23/2023   INR 1.06 08/01/2016   HGBA1C 5.6 08/10/2023      Assessment / Plan:   Reginald Fernandez is a very pleasant 43 year old man with history of severe biventricular heart failure.  He was recently admitted, mainly with GI complaints, and was found to have decompensated heart failure.  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  Up until early November he was playing basketball for exercise but now he mainly does walking.  He feels pretty good and doesn't feel his hemodynamics correlate well with how good he feels.  Prior to admission he was actively vaping but wants to stop now as he knows it's a barrier to transplant.  He asked me if nicotine-free vaping would be ok - I'm not sure the answer to this.  From a surgical standpoint, he is young and robust and would tolerate a heart operation.  He works a full-time job and wants to continue working.  He does not have significant valvular disease.  My main concern regarding LVAD would be how his right ventricle would do.  His RV looks severely dysfunctional on ECHO, but on  RHC, his PAPi was 2.3, RA 6, PA 36/22 (29).     I discussed with him the peri-op and post-op details of HM3 implantation. I discussed the general nature of the procedure, including the need for general anesthesia, the incisions to be used, the use of cardiopulmonary bypass, and the use of temporary pacemaker wires and drainage tubes postoperatively with him and his dad.  We discussed the expected hospital stay, overall recovery and short and long term outcomes. I informed him of the indications, risks, benefits and alternatives.   He understands the risks include, but are not limited  to death, stroke, MI, DVT/PE, bleeding, possible need for transfusion, infections, cardiac arrhythmias, as well as other organ system dysfunction including respiratory (eg: prolonged ventilation), renal, or GI complications.   Ideally, I think Reginald Fernandez would be a good candidate for heart transplantation, and he does prefer transplant over LVAD at this time.  There are several issues (insurance, vaping) that may be a barrier to this, but I think definitely worth exploring with evaluation at [facility].  If possible, would like to try to bridge patient with home milrinone  so that he could be evaluated at [facility] and patient seemed interested in this as well.  Con RAMAN Emmaly Leech 03/10/2024 2:59 PM          [1]  Social History Tobacco Use  Smoking Status Former   Current packs/day: 0.00   Types: Cigarettes   Quit date: 09/2016   Years since quitting: 7.4  Smokeless Tobacco Never  [2]  Allergies Allergen Reactions   Penicillins Other (See Comments)    Childhood allergy Unknown reaction  Did it involve swelling of the face/tongue/throat, SOB, or low BP? No Did it involve sudden or severe rash/hives, skin peeling, or any reaction on the inside of your mouth or nose? No Did you need to seek medical attention at a hospital or doctor's office? No When did it last happen?      child If all above answers are NO, may proceed  with cephalosporin use.   "

## 2024-03-10 NOTE — Progress Notes (Signed)
 " Heart and Vascular Care Navigation  03/10/2024  Reginald Fernandez [birth year] [id number]  Reason for Referral: Outpatient Heart Failure CSW met with patient for initial contact with potential VAD Candidate   Engaged with patient face to face for initial visit for Heart and Vascular Care Coordination.                                                                                                   Assessment:       CSW met with pt at bedside to complete brief assessment of patient social support and financial concerns for potential LVAD workup.  Patient reports he lives at home with his parents and shares living expenses with them- both parents work full time so are not in the home during the day.  States parents are supportive for the most part but unsure of their willingness of availability to provide caregiving for him post surgery. Has 3 children- one adult and 2 minors.  Shares custody of minors with their mother.  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  Earns about $3,200/month per patient.   Was planning on opening his own care facility in January but unsure now if that is feasible.  Reports he has insurance through [facility] but forgot to reenroll- has spoken to them and understands he will lose insurance for month of January then will have to enroll again during special enrollment period and regain insurance in February.  Does not have STD benefits through work though thinks he can sign up- he will ask if that is still an option.  Discussed that it might be barrier to obtaining as he has acute current medical needs- expressed understanding.  Discuss applying for SSDI- he is unsure at this time as he wants to return to work knowing that SSDI will not provide sufficient income for him.                          HRT/VAS Care Coordination     Living arrangements for the past 2 months Single Family Home   Lives with:  Parents   Home Assistive Devices/Equipment None   Current home services DME  scale       Social History:                                                                             SDOH Screenings   Food Insecurity: No Food Insecurity (03/07/2024)  Housing: Low Risk (03/07/2024)  Transportation Needs: No Transportation Needs (03/07/2024)  Utilities: Not At Risk (03/07/2024)  Depression (PHQ2-9): Low Risk (06/27/2022)  Social Connections: Unknown (03/07/2024)  Tobacco Use: Medium Risk (03/04/2024)    SDOH Interventions: Financial Resources:    Full time [employer]  Food Insecurity:  None reported- ineligible for food stamps  Housing Insecurity:  Lives with parents  Transportation:   Not assessed    Follow-up plan:    CSW will continue to follow for needs and complete full assessment for LVAD at a later date if that is recommended by the team.  Patient remains hopeful for transplant but understands he is currently not eligible and might need to consider LVAD if he is unable to make it to transplant.  Andriette HILARIO Leech, LCSW Clinical Social Worker [facility] Desk#: [phone] Cell#: [phone]    "

## 2024-03-10 NOTE — Interval H&P Note (Signed)
 History and Physical Interval Note:  03/10/2024 9:57 AM  Reginald Fernandez  has presented today for surgery, with the diagnosis of heart failure.  The various methods of treatment have been discussed with the patient and family. After consideration of risks, benefits and other options for treatment, the patient has consented to  Procedures: RIGHT HEART CATH (N/A) as a surgical intervention.  The patient's history has been reviewed, patient examined, no change in status, stable for surgery.  I have reviewed the patient's chart and labs.  Questions were answered to the patient's satisfaction.     Hannahgrace Lalli

## 2024-03-11 ENCOUNTER — Ambulatory Visit (HOSPITAL_COMMUNITY): Payer: Self-pay | Admitting: Cardiology

## 2024-03-11 ENCOUNTER — Encounter (HOSPITAL_COMMUNITY): Payer: Self-pay | Admitting: Internal Medicine

## 2024-03-11 DIAGNOSIS — N179 Acute kidney failure, unspecified: Secondary | ICD-10-CM | POA: Diagnosis not present

## 2024-03-11 DIAGNOSIS — I2694 Multiple subsegmental pulmonary emboli without acute cor pulmonale: Secondary | ICD-10-CM | POA: Diagnosis not present

## 2024-03-11 DIAGNOSIS — Z9861 Coronary angioplasty status: Secondary | ICD-10-CM | POA: Diagnosis not present

## 2024-03-11 DIAGNOSIS — I2510 Atherosclerotic heart disease of native coronary artery without angina pectoris: Secondary | ICD-10-CM | POA: Diagnosis not present

## 2024-03-11 DIAGNOSIS — I5023 Acute on chronic systolic (congestive) heart failure: Secondary | ICD-10-CM | POA: Diagnosis not present

## 2024-03-11 DIAGNOSIS — E66812 Obesity, class 2: Secondary | ICD-10-CM | POA: Diagnosis not present

## 2024-03-11 DIAGNOSIS — I10 Essential (primary) hypertension: Secondary | ICD-10-CM | POA: Diagnosis not present

## 2024-03-11 DIAGNOSIS — Z515 Encounter for palliative care: Secondary | ICD-10-CM

## 2024-03-11 DIAGNOSIS — I110 Hypertensive heart disease with heart failure: Principal | ICD-10-CM

## 2024-03-11 DIAGNOSIS — Z7189 Other specified counseling: Secondary | ICD-10-CM

## 2024-03-11 DIAGNOSIS — E785 Hyperlipidemia, unspecified: Secondary | ICD-10-CM | POA: Diagnosis not present

## 2024-03-11 LAB — COOXEMETRY PANEL
Carboxyhemoglobin: 0.6 % (ref 0.5–1.5)
Carboxyhemoglobin: 1.5 % (ref 0.5–1.5)
Methemoglobin: 0.8 % (ref 0.0–1.5)
Methemoglobin: <0.7 % (ref 0.0–1.5)
O2 Saturation: 42.2 %
O2 Saturation: 63.6 %
Total hemoglobin: 17.6 g/dL — ABNORMAL HIGH (ref 12.0–16.0)
Total hemoglobin: 17.3 g/dL — ABNORMAL HIGH (ref 12.0–16.0)

## 2024-03-11 LAB — BASIC METABOLIC PANEL WITH GFR
Anion gap: 14 (ref 5–15)
BUN: 24 mg/dL — ABNORMAL HIGH (ref 6–20)
CO2: 24 mmol/L (ref 22–32)
Calcium: 9 mg/dL (ref 8.9–10.3)
Chloride: 93 mmol/L — ABNORMAL LOW (ref 98–111)
Creatinine, Ser: 1.26 mg/dL — ABNORMAL HIGH (ref 0.61–1.24)
GFR, Estimated: >60 mL/min
Glucose, Bld: 172 mg/dL — ABNORMAL HIGH (ref 70–99)
Potassium: 4.9 mmol/L (ref 3.5–5.1)
Sodium: 130 mmol/L — ABNORMAL LOW (ref 135–145)

## 2024-03-11 LAB — CBC
HCT: 50.3 % (ref 39.0–52.0)
Hemoglobin: 16.9 g/dL (ref 13.0–17.0)
MCH: 31.1 pg (ref 26.0–34.0)
MCHC: 33.6 g/dL (ref 30.0–36.0)
MCV: 92.6 fL (ref 80.0–100.0)
Platelets: 288 K/uL (ref 150–400)
RBC: 5.43 MIL/uL (ref 4.22–5.81)
RDW: 13.4 % (ref 11.5–15.5)
WBC: 9.7 K/uL (ref 4.0–10.5)
nRBC: 0 % (ref 0.0–0.2)

## 2024-03-11 LAB — DIGOXIN LEVEL: Digoxin Level: 1 ng/mL (ref 0.8–2.0)

## 2024-03-11 LAB — MAGNESIUM: Magnesium: 2.5 mg/dL — ABNORMAL HIGH (ref 1.7–2.4)

## 2024-03-11 MED ORDER — ISOSORB DINITRATE-HYDRALAZINE 20-37.5 MG PO TABS
1.0000 | ORAL_TABLET | Freq: Three times a day (TID) | ORAL | Status: DC
  Administered 2024-03-11 – 2024-03-12 (×4): 1 via ORAL
  Filled 2024-03-11 (×4): qty 1

## 2024-03-11 MED ORDER — DIGOXIN 125 MCG PO TABS
0.0625 mg | ORAL_TABLET | Freq: Every day | ORAL | Status: DC
  Administered 2024-03-12 – 2024-03-19 (×8): 0.0625 mg via ORAL
  Filled 2024-03-11 (×4): qty 1

## 2024-03-11 NOTE — Progress Notes (Addendum)
" °  Progress Note   Patient: Reginald Fernandez FMW:[id number] DOB: 1980/04/10 DOA: 03/04/2024     5 DOS: the patient was seen and examined on 03/11/2024   Brief hospital course: [name] was admitted to the hospital with the working diagnosis of heart failure exacerbation.   Patient 43 year old gentleman history of CAD, hypertension, hyperlipidemia, morbid obesity, history of PE on Eliquis , constipation, medication nonadherence due to financial constraints presented to the ED with abdominal pain, constipation, shortness of breath.  Patient noted on admission to have elevated proBNP, admitted for acute CHF exacerbation and constipation.  Patient placed on IV Lasix .  Cardiology consulted and patient being followed by the heart failure team.  Placed on milrinone .    12/23 cardiac catheterization, normal LV filling pressure, but persistent low output failure due to biventricular dysfunction.    Assessment and Plan: * Acute on chronic systolic CHF (congestive heart failure) (HCC) Echocardiogram with reduced LV systolic function with EF < 20 %, global hypokinesis, moderate dilatated cavity, grade II diastolic dysfunction with pseudo normalization EA, severe reduction of RV systolic function, LA with severe dilation, RA with mild dilatation, no significant valvular disease.   Urine output is 3,225 ml Systolic blood pressure 130 to 150 mmHg.  SV02 63,6  Plan to continue medical therapy with SGLT 2 inh, digoxin , bidil , entresto , and sprionolactone Inotropic support with milrinone  0.125 mcg/kg/min  Loop diuretic with torsemide  40 mg po daily.   NSVT/ PVC Continue with amiodarone  and mexiletine   CAD S/P percutaneous coronary angioplasty: DES PCI -Cx-OM1, RCA x 2 Elevation in high sensitive troponin due to heart failure decompensation  No chest pain, no acute coronary syndrome Continue as needed nitroglycerin    Essential hypertension Continue blood pressure control with bidil , entresto ,  spironolactone    Pulmonary emboli (HCC) Continue anticoagulation with apixaban   AKI (acute kidney injury) Hyponatremia,   Renal function today with serum cr at 1,26 with K at 4,9 and serum bicarbonate aet 24  Na 130  Continue diuresis and follow up renal function and electrolytes as outpatient   Hyperlipidemia LDL goal <70 Continue atorvastatin  and ezetimibe    Obesity, class 2 Calculated BMI 35.3       Subjective: Patient with no chest pain and no dyspnea, no PND, orthopnea or lower extremity edema, he has been on continuous inotropic support   Physical Exam: Vitals:   03/11/24 0338 03/11/24 0710 03/11/24 1048 03/11/24 1504  BP: (!) 153/91 135/83 (!) 143/97 (!) 129/90  Pulse: 72 72 88 88  Resp: 18 16 16 16   Temp: 97.6 F (36.4 C) 97.9 F (36.6 C) 97.6 F (36.4 C) 97.8 F (36.6 C)  TempSrc: Oral Oral Oral Oral  SpO2: 99% 100% 100% 100%  Weight: 124.8 kg     Height:       Neurology awake and alert ENT with mild pallor with no icterus Cardiovascular with S1 and S2 present and regular with no gallops, rubs or murmurs No JVD Respiratory with no rales or wheezing, no rhonchi  Abdomen with no distention, soft and non tender, protuberant  No lower extremity edema   Data Reviewed:    Family Communication: no family at the bedside   Disposition: Status is: Inpatient Remains inpatient appropriate because: Inotropic support   Planned Discharge Destination: Home    Author: Elidia Toribio Furnace, MD 03/11/2024 4:09 PM  For on call review [email or web address].  "

## 2024-03-11 NOTE — Consult Note (Signed)
 " Consultation Note Date: 03/11/2024   Patient Name: Reginald Fernandez  DOB: Aug 21, 1980  MRN: [id number]  Age / Sex: 43 y.o., male   PCP: [name], MD Referring Physician: Noralee Elidia Toribio DEWAINE  Reason for Consultation: Establishing goals of care     Chief Complaint/History of Present Illness:  Reginald Fernandez is a 43 year old male with history of CAD, hypertension, hyperlipidemia, PE on Eliquis , difficulty with medication compliance due to financial constraints admitted for heart failure and being followed by advanced heart failure team with recommendation for evaluation for LVAD.  Palliative care consulted in consideration for LVAD workup.  I saw and examined patient today.  He was eating breakfast at time of my encounter.  I introduced palliative care as specialized medical care for people living with serious illness. It focuses on providing relief from the symptoms and stress of a serious illness. The goal is to improve quality of life for both the patient and the family.  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  At this point, he expressed that he has been feeling overwhelmed with continued conversations regarding the same stuff and all the information he has been getting.  He becomes tearful and feels that it is too much to discuss again today.  He is invested in plan for consideration for LVAD and we discussed outlining wishes for surrogate decision making and consideration for advance care planning in light of desire for LVAD and the severity of his illness.  He asked me to relay what information he needs to consider so he can think about it and he is then open to follow-up for further discussion after the holiday tomorrow.    We discussed surrogate decision making in [place] .  He understands that currently his decision makers would be his  parents in conjunction with his adult child.  States he will need to consider if there is an individual who he would want to name as healthcare power of attorney.  Also discussed that LVAD is a major procedure with potential complications and/or prolonged recovery period.  He reports having good understanding of this after discussion with Dr. Daniel yesterday.    Questions and concerns addressed.   PMT will continue to support holistically and follow-up later this week per patient request.   Primary Diagnoses  Present on Admission:  Acute on chronic systolic heart failure (HCC)  Essential hypertension  Hyperlipidemia LDL goal <70  Dilated cardiomyopathy (HCC)  Morbid obesity (HCC)  Pulmonary emboli (HCC)  Elevated troponin   Palliative Review of Systems: Reports feeling run down but no specific complaints.  Past Medical History:  Diagnosis Date   CAD S/P percutaneous coronary angioplasty 11/20/2013   a. 09/2009: EKG with Inf STEMI - no obstructive CAD;  b. 11/2013 Inflat STEMI/PCI: LM nl, LAD 20p, D1 sm - diff dzs, D2 large - nl, LCX 95-99, OM1 100 (3.5x38 Promus Premier DES), RCA 95-68m, 80d (3.0x20 and 3.0x16 Promus Premier DES') - normal EF;  c. NSTEMI 5/18 - patent stents, otw minimal CAD.- EF by Echo 35-40%; d. NSTEMI 10/2018 - ? culprit - Small branch of  D2,Med Rx,.  EF by Echo ~25%   Cardiomyopathy, ischemic 07/2016   h/o Inf STEMI 11/2013 - EF was Normal; b) NSTEMI 07/2016 (NO Cultprit on Cath) - Echo EF 35-40% (Basal Ant-Lat HK, basal-mid Inferolateral & apical Akinesis); c) NSTEMI 10/2018 -felt  to be occluded small branch of D2, Echo EF further decreased to 20-25%.    Essential hypertension    Hyperlipidemia with target LDL less than 100    Marijuana abuse    Migraine    Morbid obesity (HCC)    ST-segment elevation myocardial infarction (STEMI) of inferior wall (HCC) 11/20/2013   Social History   Socioeconomic History   Marital status: Married    Spouse name: Not on file    Number of children: 2   Years of education: Not on file   Highest education level: Not on file  Occupational History    Employer: [employer]    Comment: [place]  Tobacco Use   Smoking status: Former    Current packs/day: 0.00    Types: Cigarettes    Quit date: 09/2016    Years since quitting: 7.4   Smokeless tobacco: Never  Vaping Use   Vaping status: Every Day  Substance and Sexual Activity   Alcohol use: No    Alcohol/week: 0.0 standard drinks of alcohol   Drug use: No   Sexual activity: Yes  Other Topics Concern   Not on file  Social History Narrative   Lives in [place] with wife.  Works with mentally challenged adults.   Social Drivers of Health   Tobacco Use: Medium Risk (03/04/2024)   Patient History    Smoking Tobacco Use: Former    Smokeless Tobacco Use: Never    Passive Exposure: Not on Actuary Strain: Not on file  Food Insecurity: No Food Insecurity (03/07/2024)   Epic    Worried About Programme Researcher, Broadcasting/film/video in the Last Year: Never true    Ran Out of Food in the Last Year: Never true  Transportation Needs: No Transportation Needs (03/07/2024)   Epic    Lack of Transportation (Medical): No    Lack of Transportation (Non-Medical): No  Physical Activity: Not on file  Stress: Not on file  Social Connections: Unknown (03/07/2024)   Social Connection and Isolation Panel    Frequency of Communication with Friends and Family: More than three times a week    Frequency of Social Gatherings with Friends and Family: More than three times a week    Attends Religious Services: Never    Database Administrator or Organizations: Yes    Attends Banker Meetings: Never    Marital Status: Not on file  Depression (PHQ2-9): Low Risk (06/27/2022)   Depression (PHQ2-9)    PHQ-2 Score: 2  Alcohol Screen: Not on file  Housing: Low Risk (03/07/2024)   Epic    Unable to Pay for Housing in the Last Year: No    Number of Times  Moved in the Last Year: 0    Homeless in the Last Year: No  Utilities: Not At Risk (03/07/2024)   Epic    Threatened with loss of utilities: No  Health Literacy: Not on file   Family History  Problem Relation Age of Onset   Hypertension Mother    Heart attack Father 53       2 MIs by age 35 (first at 80)-- CABG   Hypertension Father    Heart failure Father    Hyperlipidemia Father    Heart attack Maternal Grandfather        106's   Heart attack Paternal Grandfather        13's   Heart attack Other    Scheduled Meds:  amiodarone   400  mg Oral BID   apixaban   5 mg Oral BID   atorvastatin   80 mg Oral q1800   Chlorhexidine  Gluconate Cloth  6 each Topical Daily   dapagliflozin  propanediol  10 mg Oral Daily   [START ON 03/12/2024] digoxin   0.0625 mg Oral Daily   ezetimibe   10 mg Oral Daily   isosorbide -hydrALAZINE   1 tablet Oral TID   mexiletine  300 mg Oral BID   pantoprazole   40 mg Oral Daily   sacubitril -valsartan   1 tablet Oral BID   sodium chloride  flush  10-40 mL Intracatheter Q12H   sodium chloride  flush  3 mL Intravenous Q12H   spironolactone   25 mg Oral Daily   torsemide   40 mg Oral Daily   Continuous Infusions:  milrinone  0.125 mcg/kg/min (03/11/24 0426)   PRN Meds:.acetaminophen , HYDROmorphone  (DILAUDID ) injection, methocarbamol , naLOXone  (NARCAN )  injection, nitroGLYCERIN , ondansetron  (ZOFRAN ) IV, polyethylene glycol, senna-docusate, sodium chloride  flush, sodium chloride  flush Allergies[1] CBC:    Component Value Date/Time   WBC 9.7 03/11/2024 0453   HGB 16.9 03/11/2024 0453   HCT 50.3 03/11/2024 0453   PLT 288 03/11/2024 0453   MCV 92.6 03/11/2024 0453   NEUTROABS 6.3 08/07/2023 0415   LYMPHSABS 2.1 08/07/2023 0415   MONOABS 1.3 (H) 08/07/2023 0415   EOSABS 0.2 08/07/2023 0415   BASOSABS 0.1 08/07/2023 0415   Comprehensive Metabolic Panel:    Component Value Date/Time   NA 130 (L) 03/11/2024 0500   K 4.9 03/11/2024 0500   CL 93 (L) 03/11/2024 0500    CO2 24 03/11/2024 0500   BUN 24 (H) 03/11/2024 0500   CREATININE 1.26 (H) 03/11/2024 0500   CREATININE 0.67 10/31/2018 1606   GLUCOSE 172 (H) 03/11/2024 0500   CALCIUM  9.0 03/11/2024 0500   AST 29 03/09/2024 0500   ALT 27 03/09/2024 0500   ALKPHOS 94 03/09/2024 0500   BILITOT 1.9 (H) 03/09/2024 0500   PROT 8.9 (H) 03/09/2024 0500   ALBUMIN 4.8 03/09/2024 0500    Physical Exam: Vital Signs: BP (!) 129/90 (BP Location: Left Arm)   Pulse 88   Temp 97.8 F (36.6 C) (Oral)   Resp 16   Ht 6' 2 (1.88 m)   Wt 124.8 kg   SpO2 100%   BMI 35.33 kg/m  SpO2: SpO2: 100 % O2 Device: O2 Device: Room Air O2 Flow Rate: O2 Flow Rate (L/min): 0 L/min Intake/output summary:  Intake/Output Summary (Last 24 hours) at 03/11/2024 1601 Last data filed at 03/11/2024 1459 Gross per 24 hour  Intake 243 ml  Output 3075 ml  Net -2832 ml   LBM: Last BM Date : 03/09/24 Baseline Weight: Weight: 126.1 kg Most recent weight: Weight: 124.8 kg  General: NAD, alert Eyes: conjunctiva clear, anicteric sclera HENT: normocephalic, atraumatic, moist mucous membranes Cardiovascular: RRR Respiratory: no increased work of breathing noted, not in respiratory distress Skin: no rashes or lesions on visible skin Neuro: A&Ox4, following commands easily Psych: appropriately answers all questions, appropriately tearful    Imaging: CARDIAC CATHETERIZATION Findings:  RA = 6 RV = 38/5 PA = 36/22 (29)  PCW =12 Fick cardiac output/index = 4.8/1.9  Thermo CO/CI = 4.3/1.7 PVR = 3.5 WU (Fick) 3.9 (TD) Ao sat = 94% PA sat = 69%, 70% PAPi = 2.3  Assessment: 1. Low output HF due to biventricular dysfunction with normal left siding  filling pressures  Plan/Discussion:   Restart milrinone . Being w/u for advanced therapies (transplant). Will  refer to [facility] as outpatient. Will likely need home milrinone   and LifeVest  Toribio Fuel, MD  10:44 AM    I personally reviewed recent imaging.   Palliative  Care Assessment and Plan Summary of Established Goals of Care and Medical Treatment Preferences    # Complex medical decision making/goals of care  - Patient invested in continued workup for LVAD.    - He appears to be emotionally overwhelmed with everything that is going on which limits conversation today as he expressed this is too much right now.  This may have component of increased emotion due to being in hospital for Christmas as he was agreeable to palliative care follow-up later this week after the holiday.  - Initial discussion regarding education on advance care planning and advanced directives.  He requests time to consider everything with follow-up later this week.  -  Code Status: Full Code   # Psycho-social/Spiritual Support:  - Support System: Family including parents - Desire for further Chaplain support: Not addressed today  Thank you for allowing the palliative care team to participate in the care Carol JONELLE Bathe.  Amaryllis Meissner, MD Palliative Care Provider PMT # [phone]  If patient remains symptomatic despite maximum doses, please call PMT at [phone] between 0700 and 1900. Outside of these hours, please call attending, as PMT does not have night coverage.   I personally spent a total of 60 minutes in the care of the patient today including preparing to see the patient, getting/reviewing separately obtained history, performing a medically appropriate exam/evaluation, counseling and educating, referring and communicating with other health care professionals, and documenting clinical information in the EHR.     [1]  Allergies Allergen Reactions   Penicillins Other (See Comments)    Childhood allergy Unknown reaction  Did it involve swelling of the face/tongue/throat, SOB, or low BP? No Did it involve sudden or severe rash/hives, skin peeling, or any reaction on the inside of your mouth or nose? No Did you need to seek medical attention at a hospital or  doctor's office? No When did it last happen?      child If all above answers are NO, may proceed with cephalosporin use.   "

## 2024-03-11 NOTE — Assessment & Plan Note (Addendum)
 Elevation in high sensitive troponin due to heart failure decompensation  No chest pain, no acute coronary syndrome Continue as needed nitroglycerin 

## 2024-03-11 NOTE — Progress Notes (Addendum)
 Initial Nutrition Assessment  DOCUMENTATION CODES:   Not applicable  INTERVENTION:   Add double proteins to meal trays to optimize nutrition prior to surgery  Add snacks BID  Add MVI w/ minerals  Daily weight to assess trend and fluid status  Education provided regarding heart healthy diet and nutrition needs prior to and after LVAD placement  Monitor bowels and need for scheduled bowel regimen    NUTRITION DIAGNOSIS:  Increased nutrient needs related to chronic illness as evidenced by estimated needs.  GOAL:  Patient will meet greater than or equal to 90% of their needs  MONITOR:  PO intake, Weight trends  REASON FOR ASSESSMENT:   Consult Assessment of nutrition requirement/status, Other (Comment) (LVAD)  ASSESSMENT:   Pt with PMH significant for: severe biventricular heart failure (EF<20%), CAD w/ multiple MI, s/p multiple DES, HLD, HTN, ischemic cardiomyopathy, fatty liver disease. Presented with abdominal pain, constipation and SOB. Admitted for acute CHF exacerbation and started on IV Lasix . Cardiology consulted and now undergoing evaluation for LVAD placement.  Remains on IV milrinone , RHC yesterday showed normal filling pressures, but low output. CVP 4/5. Diuresis ongoing. Torsemide  decreased today. Plans to d/c home on milrinone  and LifeVest.   He is eating 100% of his meals. No difficulty chewing or swallowing noted or reported at baseline.  No N/V/C/D. Reports 2-3 bowel movements per day. None noted since 12/22. Will monitor bowel frequency and need for bowel regimen as he is at risk for constipation 2/2 diuresis and potential dehydration as a result.   24 Hour Recall B: skips L: salad w/ boiled egg and oil and vinegar dressing and water D: steamed vegetables w/ chicken or fish and water Snacks: varies (cheese and nuts, fruit, or Nabs occasionally)   Reports no significant changes from intake described above in days leading up to admission. He reports giving  up meat 7 years ago, but has re-introduced this in last year. Does not consume pork or beef, per choice, but does consume lean proteins and prefers more plant based proteins. Able to list these.   He is amicable to snacks between meals and double portion proteins to aid in nutrition optimization.   Spoke with him regarding nutritionally optimization prior to surgery and nutrition recommendations thereafter. He is following a relatively stringent heart healthy diet at this time.   Admit Weight: 126.1 kg Current Weight: 124.8 kg  Reports UBW around 250-260lbs. Current body weight 274lbs. Some generalized, non-pitting edema on exam. Fluid status improving. Per chart review, has 5.2% weight gain in last six months. This is not considered clinically significant for the time frame, but is likely fluid-related. Significant fluctuations in volume status likely contributing to weight trend and likely skewing ability to accurately assess recent weight trend. Net negative 11L this admission, per I/Os flowsheet.   Drains/Lines: R brachial: PICC, double lumen UOP: 3225 ml x24 hours   Meds: Farxiga , pantoprazole , spironolactone , torsemide , IV milrinone    Labs:  Na+ 130 (L) CBGs 138-172 x24 hours A1c 5.6 (07/2023)    NUTRITION - FOCUSED PHYSICAL EXAM: No significant muscle or fat depletions on exam. Notably, fluid status skewing weight trend, but likely not additional muscle or fat depletions. Will monitor and repeat NFPE as able and appropriate.   Flowsheet Row Most Recent Value  Orbital Region No depletion  Upper Arm Region No depletion  Thoracic and Lumbar Region No depletion  Buccal Region No depletion  Temple Region No depletion  Clavicle Bone Region No depletion  Clavicle and Acromion  Bone Region No depletion  Scapular Bone Region No depletion  Dorsal Hand No depletion  Patellar Region No depletion  Anterior Thigh Region No depletion  Posterior Calf Region No depletion  Edema (RD  Assessment) None  Hair Reviewed  Eyes Reviewed  Mouth Reviewed  Skin Reviewed  Nails Reviewed    Diet Order:   Diet Order             Diet Heart Room service appropriate? Yes; Fluid consistency: Thin  Diet effective now             EDUCATION NEEDS:  Education needs have been addressed  Skin:  Skin Assessment: Reviewed RN Assessment  Last BM:  12/22  Height:  Ht Readings from Last 1 Encounters:  03/10/24 6' 2 (1.88 m)   Weight:  Wt Readings from Last 1 Encounters:  03/11/24 124.8 kg    Ideal Body Weight:  86.4 kg  BMI:  Body mass index is 35.33 kg/m.  Estimated Nutritional Needs:   Kcal:  2100-2300 kcals  Protein:  105-120g  Fluid:  >2L/day  Blair Deaner MS, RD, LDN Registered Dietitian I Clinical Nutrition RD Inpatient Contact Info in Amion

## 2024-03-11 NOTE — Assessment & Plan Note (Addendum)
 Hyponatremia,   Stable renal function with serum cr at 1,31 with K at 4,5 and serum bicarbonate at 28  Na 132 and mg 2.5   Continue diuresis and follow up renal function and electrolytes in am.

## 2024-03-11 NOTE — Plan of Care (Signed)
  Problem: Education: Goal: Ability to demonstrate management of disease process will improve Outcome: Progressing Goal: Ability to verbalize understanding of medication therapies will improve Outcome: Progressing Goal: Individualized Educational Video(s) Outcome: Progressing   Problem: Activity: Goal: Capacity to carry out activities will improve Outcome: Progressing   Problem: Cardiac: Goal: Ability to achieve and maintain adequate cardiopulmonary perfusion will improve Outcome: Progressing   Problem: Education: Goal: Knowledge of General Education information will improve Description: Including pain rating scale, medication(s)/side effects and non-pharmacologic comfort measures Outcome: Progressing   Problem: Health Behavior/Discharge Planning: Goal: Ability to manage health-related needs will improve Outcome: Progressing   Problem: Clinical Measurements: Goal: Ability to maintain clinical measurements within normal limits will improve Outcome: Progressing Goal: Will remain free from infection Outcome: Progressing Goal: Diagnostic test results will improve Outcome: Progressing Goal: Respiratory complications will improve Outcome: Progressing Goal: Cardiovascular complication will be avoided Outcome: Progressing   Problem: Activity: Goal: Risk for activity intolerance will decrease Outcome: Progressing   Problem: Nutrition: Goal: Adequate nutrition will be maintained Outcome: Progressing   Problem: Coping: Goal: Level of anxiety will decrease Outcome: Progressing   Problem: Elimination: Goal: Will not experience complications related to bowel motility Outcome: Progressing Goal: Will not experience complications related to urinary retention Outcome: Progressing   Problem: Pain Managment: Goal: General experience of comfort will improve and/or be controlled Outcome: Progressing   Problem: Safety: Goal: Ability to remain free from injury will  improve Outcome: Progressing   Problem: Skin Integrity: Goal: Risk for impaired skin integrity will decrease Outcome: Progressing   Problem: Education: Goal: Understanding of CV disease, CV risk reduction, and recovery process will improve Outcome: Progressing Goal: Individualized Educational Video(s) Outcome: Progressing   Problem: Activity: Goal: Ability to return to baseline activity level will improve Outcome: Progressing   Problem: Cardiovascular: Goal: Ability to achieve and maintain adequate cardiovascular perfusion will improve Outcome: Progressing Goal: Vascular access site(s) Level 0-1 will be maintained Outcome: Progressing   Problem: Health Behavior/Discharge Planning: Goal: Ability to safely manage health-related needs after discharge will improve Outcome: Progressing

## 2024-03-11 NOTE — Progress Notes (Addendum)
 "  Advanced Heart Failure Rounding Note  Chief Complaint: Low output HF Significant Events:    12/23: RHC w RA 6, PA 36/22 (29), PCW 12, TD CO/CI 4.3/1.7, PVR 3.5 WU, PAPi 2.3  Subjective:    Started on milrinone  for low output. Now on milrinone  0.125. Co-ox 64%. Normal filling pressures. CVP 4/5. BMET pending.   Sitting up in chair, feeling well. No SOB, CP. Updated on plan, questions answered.   Objective:    Vital Signs:   Temp:  [96.7 F (35.9 C)-98 F (36.7 C)] 97.9 F (36.6 C) (12/24 0710) Pulse Rate:  [0-92] 72 (12/24 0710) Resp:  [7-20] 16 (12/24 0710) BP: (133-180)/(83-136) 135/83 (12/24 0710) SpO2:  [84 %-100 %] 100 % (12/24 0710) Weight:  [124.8 kg] 124.8 kg (12/24 0338) Last BM Date : 03/09/24  Weight change: Filed Weights   03/09/24 0300 03/10/24 0300 03/11/24 0338  Weight: 120.2 kg 121.2 kg 124.8 kg   Intake/Output:  Intake/Output Summary (Last 24 hours) at 03/11/2024 0759 Last data filed at 03/11/2024 0135 Gross per 24 hour  Intake 243 ml  Output 3225 ml  Net -2982 ml    Physical Exam: General: Well appearing. No distress  Cardiac: JVP flat. No murmurs  Extremities: Warm and dry.  No edema.  Neuro: A&O x3. Affect pleasant.   Telemetry: SR 80s (personally reviewed)  Labs: Basic Metabolic Panel: Recent Labs  Lab 03/06/24 0328 03/06/24 1549 03/07/24 0500 03/08/24 0500 03/09/24 0500 03/10/24 0530 03/10/24 1018 03/11/24 0453  NA 136  --  134* 133* 130* 131* 136  136  --   K 3.6   < > 4.0 4.5 4.8 5.0 4.8  4.9  --   CL 97*  --  95* 94* 92* 96*  --   --   CO2 27  --  28 28 25 25   --   --   GLUCOSE 132*  --  174* 179* 213* 138*  --   --   BUN 17  --  14 15 24* 24*  --   --   CREATININE 1.25*  --  1.22 1.29* 1.33* 1.19  --   --   CALCIUM  9.1  --  8.6* 8.9 9.3 9.1  --   --   MG  --    < > 2.3 2.3 2.6* 2.7*  --  2.5*   < > = values in this interval not displayed.    Liver Function Tests: Recent Labs  Lab 03/04/24 1915 03/07/24 0500  03/08/24 0500 03/09/24 0500  AST 24 27 27 29   ALT 21 26 26 27   ALKPHOS 82 81 86 94  BILITOT 4.1* 3.2* 2.7* 1.9*  PROT 8.3* 7.6 8.0 8.9*  ALBUMIN 4.7 4.3 4.4 4.8   Recent Labs  Lab 03/04/24 1915  LIPASE 23   No results for input(s): AMMONIA in the last 168 hours.  CBC: Recent Labs  Lab 03/07/24 0500 03/08/24 0500 03/09/24 0500 03/10/24 0530 03/10/24 1018 03/11/24 0453  WBC 9.5 9.4 9.2 8.8  --  9.7  HGB 15.6 16.5 17.5* 17.1* 18.0*  18.0* 16.9  HCT 45.3 47.1 51.0 50.5 53.0*  53.0* 50.3  MCV 91.9 90.9 90.9 91.7  --  92.6  PLT 270 268 317 301  --  288   BNP (last 3 results) Recent Labs    08/03/23 1028  BNP 892.8*   ProBNP (last 3 results) Recent Labs    03/04/24 1833  PROBNP 3,698.0*   Medications:    Scheduled  Medications:  amiodarone   400 mg Oral BID   apixaban   5 mg Oral BID   atorvastatin   80 mg Oral q1800   Chlorhexidine  Gluconate Cloth  6 each Topical Daily   dapagliflozin  propanediol  10 mg Oral Daily   digoxin   0.125 mg Oral Daily   ezetimibe   10 mg Oral Daily   isosorbide -hydrALAZINE   0.5 tablet Oral TID   mexiletine  300 mg Oral BID   pantoprazole   40 mg Oral Daily   sacubitril -valsartan   1 tablet Oral BID   sodium chloride  flush  10-40 mL Intracatheter Q12H   sodium chloride  flush  3 mL Intravenous Q12H   spironolactone   25 mg Oral Daily   torsemide   20 mg Oral Daily   And   torsemide   40 mg Oral Daily    Infusions:  sodium chloride      milrinone  0.125 mcg/kg/min (03/11/24 0426)    PRN Medications: sodium chloride , acetaminophen , HYDROmorphone  (DILAUDID ) injection, methocarbamol , naLOXone  (NARCAN )  injection, nitroGLYCERIN , ondansetron  (ZOFRAN ) IV, polyethylene glycol, senna-docusate, sodium chloride  flush, sodium chloride  flush  Assessment/Plan:   A/C Biventricular HFrEF, ICM - LV/RV severely reduced for the last couple of years. Followed in the HF clinic with previous discussions about advanced therapies. We are still concerned  he may need advanced therapies. Suspect abdominal issues are related to low output HF.  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 - RHC 12/23 with normal filling pressures with severely low cardiac output and PH - ECHO: EF < 20% RV severely decreased  - Co-ox 62% on milrinone  0.125 - CVP 4/5 and weight up. Decrease to Torsemide  40 mg daily - Continue digoxin  0.125 mg daily  - Continue entresto  24-26 mg twice a day. - Continue Spiro 25 mg daily   - Continue Farxiga  10 mg daily - increase BiDil  to 1 tab tid - w/u for advanced therapies. [facility] refer [facility] as [facility] for [facility]. Home milrinone  and LifeVest   NSVT/PVCs   - High PVC burden initially, esp while on milrinone  - Continue amio 400 mg bid + mexitil  300 mg bid - Keep K >4 and Mg >2. Stable.  - TSH ok   Abdominal Pain - CT negative. Suspect this is related to low output heart failure.    Elevated TBili - Suspect in the setting of low output HF. Follow daily CMET  T bili 4.1 -> 2.7-> 1.9. Labs pending.   CAD  - Strong family history of premature CAD.  H/o inferolateral STEMI in 9/15 with DES to LCx into OM1 and DES to RCA.  LHC 7/23 demonstrated diffuse CAD but no severe stenosis to explain worsening of his EF.   - No s/s angina - Continue atorvastatin  and zetia    H/O PE/DVT - Continue eliquis . No bleeding   Length of Stay: 5  Jordan Lee, NP 03/11/2024, 7:59 AM  Advanced Heart Failure Team Pager [phone] (M-F; 7a - 4p)  Please contact [facility] for night-coverage after hours (4p -7a ) and weekends on [email or web address]   Patient seen and examined with the above-signed Advanced Practice Provider and/or Housestaff. I personally reviewed laboratory data, imaging studies and relevant notes. I independently examined the patient and formulated the important aspects of the plan. I have edited the note to reflect any of my changes or salient points. I have personally discussed the plan with the patient and/or family.  Patient  seen and examined with the above-signed Advanced Practice Provider and/or Housestaff. I personally reviewed laboratory data, imaging studies and  relevant notes. I independently examined the patient and formulated the important aspects of the plan. I have edited the note to reflect any of my changes or salient points. I have personally discussed the plan with the patient and/or family.  Remains on milrinone . Co-ox and CVP ok. Continues to diurese  General:  Sitting up in bed. No resp difficulty HEENT: normal Neck: supple. no JVD.  Cor: Regular rate & rhythm. No rubs, gallops or murmurs. Lungs: clear Abdomen: soft, nontender, nondistended.Good bowel sounds. Extremities: no cyanosis, clubbing, rash, edema Neuro: alert & orientedx3, cranial nerves grossly intact. moves all 4 extremities w/o difficulty. Affect pleasant  Results of RHC reviewed. Given severe biventricular failure best option is clearly heart transplant with home milrinone  as a bridge (would need Lifevest). However situation complicated by the fact that he will lose his insurance in January and by the fact that he has vaped some recently.   Will await SW input on Friday. I will d/w [facility] transplant team. He is B+  Toribio Fuel, MD  6:06 PM  "

## 2024-03-11 NOTE — Plan of Care (Signed)

## 2024-03-12 DIAGNOSIS — I5023 Acute on chronic systolic (congestive) heart failure: Secondary | ICD-10-CM | POA: Diagnosis not present

## 2024-03-12 DIAGNOSIS — I2510 Atherosclerotic heart disease of native coronary artery without angina pectoris: Secondary | ICD-10-CM | POA: Diagnosis not present

## 2024-03-12 DIAGNOSIS — N179 Acute kidney failure, unspecified: Secondary | ICD-10-CM

## 2024-03-12 DIAGNOSIS — I10 Essential (primary) hypertension: Secondary | ICD-10-CM | POA: Diagnosis not present

## 2024-03-12 DIAGNOSIS — I2694 Multiple subsegmental pulmonary emboli without acute cor pulmonale: Secondary | ICD-10-CM | POA: Diagnosis not present

## 2024-03-12 LAB — BASIC METABOLIC PANEL WITH GFR
Anion gap: 12 (ref 5–15)
BUN: 22 mg/dL — ABNORMAL HIGH (ref 6–20)
CO2: 25 mmol/L (ref 22–32)
Calcium: 9 mg/dL (ref 8.9–10.3)
Chloride: 94 mmol/L — ABNORMAL LOW (ref 98–111)
Creatinine, Ser: 1.21 mg/dL (ref 0.61–1.24)
GFR, Estimated: >60 mL/min
Glucose, Bld: 113 mg/dL — ABNORMAL HIGH (ref 70–99)
Potassium: 4.7 mmol/L (ref 3.5–5.1)
Sodium: 132 mmol/L — ABNORMAL LOW (ref 135–145)

## 2024-03-12 LAB — LUPUS ANTICOAGULANT PANEL
DRVVT: 68 s — ABNORMAL HIGH (ref 0.0–47.0)
PTT Lupus Anticoagulant: 32.8 s (ref 0.0–43.5)

## 2024-03-12 LAB — MAGNESIUM: Magnesium: 2.4 mg/dL (ref 1.7–2.4)

## 2024-03-12 LAB — COOXEMETRY PANEL
Carboxyhemoglobin: 2 % — ABNORMAL HIGH (ref 0.5–1.5)
Methemoglobin: 0.8 % (ref 0.0–1.5)
O2 Saturation: 66.3 %
Total hemoglobin: 18.2 g/dL — ABNORMAL HIGH (ref 12.0–16.0)

## 2024-03-12 LAB — DRVVT CONFIRM: dRVVT Confirm: 1 ratio (ref 0.8–1.2)

## 2024-03-12 LAB — DRVVT MIX: dRVVT Mix: 49.4 s — ABNORMAL HIGH (ref 0.0–40.4)

## 2024-03-12 MED ORDER — TRAMADOL HCL 50 MG PO TABS
100.0000 mg | ORAL_TABLET | Freq: Four times a day (QID) | ORAL | Status: DC | PRN
  Administered 2024-03-12 – 2024-03-18 (×12): 100 mg via ORAL
  Filled 2024-03-12 (×7): qty 2

## 2024-03-12 NOTE — Progress Notes (Signed)
 "  Advanced Heart Failure Rounding Note  Chief Complaint: Low output HF Significant Events:    12/23: RHC w RA 6, PA 36/22 (29), PCW 12, TD CO/CI 4.3/1.7, PVR 3.5 WU, PAPi 2.3  Subjective:    Remains on milrinone . Co-ox 66% CVP remains low  Has severe HA from Bidil    Objective:    Vital Signs:   Temp:  [97.4 F (36.3 C)-98.5 F (36.9 C)] 97.4 F (36.3 C) (12/25 2027) Pulse Rate:  [71-88] 88 (12/25 2027) Resp:  [18-20] 20 (12/25 2027) BP: (117-135)/(82-101) 121/87 (12/25 2027) SpO2:  [93 %-100 %] 100 % (12/25 2027) Weight:  [118.4 kg] 118.4 kg (12/25 0524) Last BM Date : 03/11/24  Weight change: Filed Weights   03/10/24 0300 03/11/24 0338 03/12/24 0524  Weight: 121.2 kg 124.8 kg 118.4 kg   Intake/Output:  Intake/Output Summary (Last 24 hours) at 03/12/2024 2054 Last data filed at 03/12/2024 2012 Gross per 24 hour  Intake 200 ml  Output 2120 ml  Net -1920 ml    Physical Exam: General:  Sitting up on side of bed. No resp difficulty  uncomfortable with HA HEENT: normal Neck: supple. no JVD.  Cor: Regular rate & rhythm. No rubs, gallops or murmurs. Lungs: clear Abdomen: soft, nontender, nondistended.Good bowel sounds. Extremities: no cyanosis, clubbing, rash, edema Neuro: alert & orientedx3, cranial nerves grossly intact. moves all 4 extremities w/o difficulty. Affect pleasant   Telemetry: SR 80s  (personally reviewed)  Labs: Basic Metabolic Panel: Recent Labs  Lab 03/08/24 0500 03/09/24 0500 03/10/24 0530 03/10/24 1018 03/11/24 0453 03/11/24 0500 03/12/24 0603  NA 133* 130* 131* 136  136  --  130* 132*  K 4.5 4.8 5.0 4.8  4.9  --  4.9 4.7  CL 94* 92* 96*  --   --  93* 94*  CO2 28 25 25   --   --  24 25  GLUCOSE 179* 213* 138*  --   --  172* 113*  BUN 15 24* 24*  --   --  24* 22*  CREATININE 1.29* 1.33* 1.19  --   --  1.26* 1.21  CALCIUM  8.9 9.3 9.1  --   --  9.0 9.0  MG 2.3 2.6* 2.7*  --  2.5*  --  2.4    Liver Function Tests: Recent Labs   Lab 03/07/24 0500 03/08/24 0500 03/09/24 0500  AST 27 27 29   ALT 26 26 27   ALKPHOS 81 86 94  BILITOT 3.2* 2.7* 1.9*  PROT 7.6 8.0 8.9*  ALBUMIN 4.3 4.4 4.8   No results for input(s): LIPASE, AMYLASE in the last 168 hours.  No results for input(s): AMMONIA in the last 168 hours.  CBC: Recent Labs  Lab 03/07/24 0500 03/08/24 0500 03/09/24 0500 03/10/24 0530 03/10/24 1018 03/11/24 0453  WBC 9.5 9.4 9.2 8.8  --  9.7  HGB 15.6 16.5 17.5* 17.1* 18.0*  18.0* 16.9  HCT 45.3 47.1 51.0 50.5 53.0*  53.0* 50.3  MCV 91.9 90.9 90.9 91.7  --  92.6  PLT 270 268 317 301  --  288   BNP (last 3 results) Recent Labs    08/03/23 1028  BNP 892.8*   ProBNP (last 3 results) Recent Labs    03/04/24 1833  PROBNP 3,698.0*   Medications:    Scheduled Medications:  amiodarone   400 mg Oral BID   apixaban   5 mg Oral BID   atorvastatin   80 mg Oral q1800   Chlorhexidine  Gluconate Cloth  6 each  Topical Daily   dapagliflozin  propanediol  10 mg Oral Daily   digoxin   0.0625 mg Oral Daily   ezetimibe   10 mg Oral Daily   mexiletine  300 mg Oral BID   pantoprazole   40 mg Oral Daily   sacubitril -valsartan   1 tablet Oral BID   sodium chloride  flush  10-40 mL Intracatheter Q12H   sodium chloride  flush  3 mL Intravenous Q12H   spironolactone   25 mg Oral Daily   torsemide   40 mg Oral Daily    Infusions:  milrinone  0.125 mcg/kg/min (03/12/24 0256)    PRN Medications: acetaminophen , HYDROmorphone  (DILAUDID ) injection, methocarbamol , naLOXone  (NARCAN )  injection, nitroGLYCERIN , ondansetron  (ZOFRAN ) IV, polyethylene glycol, senna-docusate, sodium chloride  flush, sodium chloride  flush, traMADol   Assessment/Plan:   A/C Biventricular HFrEF, ICM - LV/RV severely reduced for the last couple of years. Followed in the HF clinic with previous discussions about advanced therapies. We are still concerned he may need advanced therapies. Suspect abdominal issues are related to low output HF.  B  positive blood type - NYHA Stage D. - RHC 12/23 with normal filling pressures with severely low cardiac output and PH - ECHO: EF < 20% RV severely decreased  - Co-ox 66% on milrinone  0.125 - Continue digoxin  0.125 mg daily  - Continue entresto  24-26 mg twice a day. - Continue Spiro 25 mg daily   - Continue Farxiga  10 mg daily - Stop bidil  with severe HA - w/u for advanced therapies. [facility] refer [facility] as outpatient for transplant. Home milrinone  and LifeVest - discussed in detail with SW today. Plan in place to get home milrinone  and LifeVest. SW has reached out to [employer]  - Blood type B+  2. NSVT/PVCs   - High PVC burden initially, esp while on milrinone  - Continue amio 400 mg bid + mexitil  300 mg bid - Keep K >4 and Mg >2. Stable.  - TSH ok - no change   Abdominal Pain - CT negative. Suspect this is related to low output heart failure.  - resolved   CAD  - Strong family history of premature CAD.  H/o inferolateral STEMI in 9/15 with DES to LCx into OM1 and DES to RCA.  LHC 7/23 demonstrated diffuse CAD but no severe stenosis to explain worsening of his EF.   - No s/s angina - Continue atorvastatin  and zetia    H/O PE/DVT - Continue Eliquis   Length of Stay: 6  Toribio Fuel, MD 03/12/2024, 8:54 PM  Advanced Heart Failure Team Pager [phone] (M-F; 7a - 4p)  Please contact [facility] for night-coverage after hours (4p -7a ) and weekends on [email or web address]     "

## 2024-03-12 NOTE — Progress Notes (Addendum)
 " Progress Note   Patient: Reginald Fernandez FMW:[id number] DOB: Nov 26, 1980 DOA: 03/04/2024     6 DOS: the patient was seen and examined on 03/12/2024   Brief hospital course: Reginald Fernandez was admitted to the hospital with the working diagnosis of heart failure exacerbation.   Patient 43 year old gentleman history of CAD, hypertension, hyperlipidemia, morbid obesity, history of PE and medication nonadherence due to financial constraints presented to the ED with abdominal pain, constipation, and dyspnea.  Patient reported 6 days of worsening symptoms prior to admission, along with a 20 lbs weight gain since June 2025.  On his initial physical examination his blood pressure was 164/101, HR 82, RR 17 and 02 saturation 98% Lungs with no wheezing or rhonchi, heart with S1 and S2 present and regular with no gallops, abdomen protuberant, soft and non tender, no lower extremity edema.   Patient placed on IV furosemide  for diuresis.  12/19 noted low output state and placed on IV milrinone  for inotropic support.    12/23 cardiac catheterization, normal LV filling pressure, but persistent low output failure due to biventricular dysfunction.  12/25 pending arrangement for outpatient home inotropic support, possible referral for heart transplant at tertiary care.    Assessment and Plan: * Acute on chronic systolic CHF (congestive heart failure) (HCC) Echocardiogram with reduced LV systolic function with EF < 20 %, global hypokinesis, moderate dilatated cavity, grade II diastolic dysfunction with pseudo normalization EA, severe reduction of RV systolic function, LA with severe dilation, RA with mild dilatation, no significant valvular disease.   Urine output is 2,095 ml Systolic blood pressure 110 to 140 mmHg.  SV02 66.3  Plan to continue medical therapy with SGLT 2 inh, digoxin , bidil , entresto , and sprionolactone Inotropic support with milrinone  0.125 mcg/kg/min  Loop diuretic with torsemide  40 mg po  daily.   NSVT/ PVC Continue with amiodarone  and mexiletine   Plan for  home inotropic support and follow up as outpatient for possible heart transplant at tertiary care center.   CAD S/P percutaneous coronary angioplasty: DES PCI -Cx-OM1, RCA x 2 Elevation in high sensitive troponin due to heart failure decompensation  No chest pain, no acute coronary syndrome Continue as needed nitroglycerin    Essential hypertension Continue blood pressure control with bidil , entresto , spironolactone    Pulmonary emboli (HCC) Continue anticoagulation with apixaban   AKI (acute kidney injury) Hyponatremia,   Stable renal function with serum cr at 1,21 with K at 4.7 and serum bicarbonate at 25 Na 132 and Mg 2.4    Continue diuresis and follow up renal function and electrolytes in am.    Hyperlipidemia LDL goal <70 Continue atorvastatin  and ezetimibe    Obesity, class 2 Calculated BMI 35.3         Subjective: Patient is having headache this morning, no chest pain, dyspnea has improved, no PND, lower extremity edema or orthopnea   Physical Exam: Vitals:   03/12/24 0508 03/12/24 0524 03/12/24 0802 03/12/24 1129  BP: 128/87  (!) 134/92 117/82  Pulse: 71  79   Resp: 18  18 20   Temp: 98.3 F (36.8 C)  98.3 F (36.8 C) 98.4 F (36.9 C)  TempSrc: Oral  Oral Oral  SpO2: 98%   93%  Weight:  118.4 kg    Height:       Neurology awake and alert ENT with no pallor or icterus Cardiovascular with S1 and S2 present and regular with no gallops, rubs or murmurs Respiratory with no rales or wheezing, no rhonchi  Abdomen protuberant,  soft and non tender, not distended No lower extremity edema   Data Reviewed:    Family Communication: no family at the bedside   Disposition: Status is: Inpatient Remains inpatient appropriate because: IV inotropic support.   Planned Discharge Destination: Home    Author: Elidia Toribio Furnace, MD 03/12/2024 2:32 PM  For on call review  [email or web address].  "

## 2024-03-12 NOTE — TOC Progression Note (Signed)
 Transition of Care [facility]) - Progression Note    Patient Details  Name: Reginald Fernandez MRN: [id number] Date of Birth: 12/01/1980  [facility] CM/SW Contact  Justina Delcia Czar, RN Phone Number: [phone] 03/12/2024, 5:20 PM  Clinical Narrative:     Spoke to pt and states he plans he continue insurance if he has to pay. Faxed referral to [employer] for Life Vest.   Sent message to [employer] rep, Pam for Home Milrinone  for follow up on insurance auth.   Will need note for work. Will PCP hospital follow up appt.   Expected Discharge Plan: Home w Home Health Services Barriers to Discharge: Continued Medical Work up     Expected Discharge Plan and Services   Discharge Planning Services: CM Consult Post Acute Care Choice: Home Health Living arrangements for the past 2 months: Single Family Home                 DME Arranged: Life vest DME Agency: [employer] Date DME Agency Contacted: 03/12/24 Time DME Agency Contacted: 1007 Representative spoke with at DME Agency: Alm [facility] Arranged: RN HH Agency: [employer] Date HH Agency Contacted: 03/10/24 Time HH Agency Contacted: 1100 Representative spoke with at [facility] Agency: Holley Herring   Social Drivers of Health (SDOH) Interventions SDOH Screenings   Food Insecurity: No Food Insecurity (03/07/2024)  Housing: Low Risk (03/07/2024)  Transportation Needs: No Transportation Needs (03/07/2024)  Utilities: Not At Risk (03/07/2024)  Depression (PHQ2-9): Low Risk (06/27/2022)  Social Connections: Unknown (03/07/2024)  Tobacco Use: Medium Risk (03/04/2024)    Readmission Risk Interventions     No data to display

## 2024-03-12 NOTE — Plan of Care (Signed)
" °  Problem: Clinical Measurements: Goal: Cardiovascular complication will be avoided Outcome: Progressing   Problem: Clinical Measurements: Goal: Diagnostic test results will improve Outcome: Progressing   "

## 2024-03-13 DIAGNOSIS — Z86711 Personal history of pulmonary embolism: Secondary | ICD-10-CM

## 2024-03-13 DIAGNOSIS — Z7901 Long term (current) use of anticoagulants: Secondary | ICD-10-CM

## 2024-03-13 DIAGNOSIS — Z9861 Coronary angioplasty status: Secondary | ICD-10-CM | POA: Diagnosis not present

## 2024-03-13 DIAGNOSIS — I5023 Acute on chronic systolic (congestive) heart failure: Secondary | ICD-10-CM | POA: Diagnosis not present

## 2024-03-13 DIAGNOSIS — Z91141 Patient's other noncompliance with medication regimen due to financial hardship: Secondary | ICD-10-CM

## 2024-03-13 DIAGNOSIS — I10 Essential (primary) hypertension: Secondary | ICD-10-CM | POA: Diagnosis not present

## 2024-03-13 DIAGNOSIS — E785 Hyperlipidemia, unspecified: Secondary | ICD-10-CM | POA: Diagnosis not present

## 2024-03-13 DIAGNOSIS — E66812 Obesity, class 2: Secondary | ICD-10-CM | POA: Diagnosis not present

## 2024-03-13 DIAGNOSIS — I2510 Atherosclerotic heart disease of native coronary artery without angina pectoris: Secondary | ICD-10-CM | POA: Diagnosis not present

## 2024-03-13 DIAGNOSIS — I2694 Multiple subsegmental pulmonary emboli without acute cor pulmonale: Secondary | ICD-10-CM | POA: Diagnosis not present

## 2024-03-13 DIAGNOSIS — N179 Acute kidney failure, unspecified: Secondary | ICD-10-CM | POA: Diagnosis not present

## 2024-03-13 DIAGNOSIS — I509 Heart failure, unspecified: Secondary | ICD-10-CM

## 2024-03-13 LAB — BASIC METABOLIC PANEL WITH GFR
Anion gap: 11 (ref 5–15)
BUN: 24 mg/dL — ABNORMAL HIGH (ref 6–20)
CO2: 26 mmol/L (ref 22–32)
Calcium: 9.1 mg/dL (ref 8.9–10.3)
Chloride: 94 mmol/L — ABNORMAL LOW (ref 98–111)
Creatinine, Ser: 1.19 mg/dL (ref 0.61–1.24)
GFR, Estimated: >60 mL/min
Glucose, Bld: 114 mg/dL — ABNORMAL HIGH (ref 70–99)
Potassium: 4.6 mmol/L (ref 3.5–5.1)
Sodium: 131 mmol/L — ABNORMAL LOW (ref 135–145)

## 2024-03-13 LAB — COOXEMETRY PANEL
Carboxyhemoglobin: 1.2 % (ref 0.5–1.5)
Methemoglobin: <0.7 % (ref 0.0–1.5)
O2 Saturation: 59.6 %
Total hemoglobin: 18.4 g/dL — ABNORMAL HIGH (ref 12.0–16.0)

## 2024-03-13 LAB — MAGNESIUM: Magnesium: 2.6 mg/dL — ABNORMAL HIGH (ref 1.7–2.4)

## 2024-03-13 NOTE — Plan of Care (Signed)
  Problem: Clinical Measurements: Goal: Cardiovascular complication will be avoided Outcome: Progressing   Problem: Education: Goal: Knowledge of General Education information will improve Description: Including pain rating scale, medication(s)/side effects and non-pharmacologic comfort measures Outcome: Progressing

## 2024-03-13 NOTE — Progress Notes (Signed)
 " Daily Progress Note   Date: 03/13/2024   Patient Name: Reginald Fernandez  DOB: 02-02-1981  MRN: [id number]  Age / Sex: 43 y.o., male  Attending Physician: Noralee Elidia Sieving,* Primary Care Physician: [name], MD Admit Date: 03/04/2024 Length of Stay: 7 days  Reason for Follow-up: Establishing goals of care  Past Medical History:  Diagnosis Date   CAD S/P percutaneous coronary angioplasty 11/20/2013   a. 09/2009: EKG with Inf STEMI - no obstructive CAD;  b. 11/2013 Inflat STEMI/PCI: LM nl, LAD 20p, D1 sm - diff dzs, D2 large - nl, LCX 95-99, OM1 100 (3.5x38 Promus Premier DES), RCA 95-66m, 80d (3.0x20 and 3.0x16 Promus Premier DES') - normal EF;  c. NSTEMI 5/18 - patent stents, otw minimal CAD.- EF by Echo 35-40%; d. NSTEMI 10/2018 - ? culprit - Small branch of  D2,Med Rx,.  EF by Echo ~25%   Cardiomyopathy, ischemic 07/2016   h/o Inf STEMI 11/2013 - EF was Normal; b) NSTEMI 07/2016 (NO Cultprit on Cath) - Echo EF 35-40% (Basal Ant-Lat HK, basal-mid Inferolateral & apical Akinesis); c) NSTEMI 10/2018 -felt to be occluded small branch of D2, Echo EF further decreased to 20-25%.    Essential hypertension    Hyperlipidemia with target LDL less than 100    Marijuana abuse    Migraine    Morbid obesity (HCC)    ST-segment elevation myocardial infarction (STEMI) of inferior wall (HCC) 11/20/2013    Assessment & Plan:   HPI/Patient Profile:   Reginald Fernandez is a 43 year old male with history of CAD, hypertension, hyperlipidemia, PE on Eliquis , difficulty with medication compliance due to financial constraints admitted for heart failure and being followed by advanced heart failure team with recommendation for evaluation for LVAD.    Palliative care consulted in consideration for LVAD workup.   SUMMARY OF RECOMMENDATIONS Full code, full scope Recommend continuing discussions for AD when patient is ready  Symptom Management:  Per primary team  Code Status: Full Code  Prognosis:  Unable to determine  Discharge Planning: To Be Determined  Subjective:   Subjective: Chart Reviewed. Updates received. Patient Assessed. Created space and opportunity for patient  and family to explore thoughts and feelings regarding current medical situation.  Today's Discussion: Met with patient at bedside with mother present. Patient emotional today as he is frustrated with insurance for delaying his discharge home with LifeVest. Patient shares that he was waiting to go home to celebrate Christmas with his children and was not ready to have further discussions about his health at this time. He requested some time to be alone. Shared with patient and mother that PMT will be available for discussions when he is ready.   Review of Systems  Unable to perform ROS   Objective:   Primary Diagnoses: Present on Admission:  Essential hypertension  Hyperlipidemia LDL goal <70  Acute on chronic systolic CHF (congestive heart failure) (HCC)  Obesity, class 2  Pulmonary emboli (HCC)   Vital Signs:  BP (!) 132/99 (BP Location: Left Arm)   Pulse 83   Temp 97.7 F (36.5 C) (Oral)   Resp 17   Ht 6' 2 (1.88 m)   Wt 119 kg   SpO2 100%   BMI 33.68 kg/m   Physical Exam Constitutional:      Appearance: He is obese.  HENT:     Head: Normocephalic and atraumatic.  Eyes:     Extraocular Movements: Extraocular movements intact.  Cardiovascular:     Rate and  Rhythm: Normal rate.  Pulmonary:     Effort: Pulmonary effort is normal.  Neurological:     General: No focal deficit present.     Mental Status: He is alert.  Psychiatric:     Comments: Tearful, depressed.    Palliative Assessment/Data: 80%   Existing Vynca/ACP Documentation: None  Thank you for allowing us  to participate in the care of Reginald Fernandez PMT will continue to support holistically.  I personally spent a total of 25 minutes in the care of the patient today including preparing to see the patient, performing a  medically appropriate exam/evaluation, and documenting clinical information in the EHR.  Fairy FORBES Shan DEVONNA  Palliative Medicine Team  Team Phone # [phone] (Nights/Weekends) 03/13/2024 3:44 PM  "

## 2024-03-13 NOTE — Plan of Care (Signed)
 Waiting on insurance to decide next step for patient ;

## 2024-03-13 NOTE — Progress Notes (Signed)
 " Progress Note   Patient: Reginald Fernandez FMW:[id number] DOB: [birth year] DOA: 03/04/2024     7 DOS: the patient was seen and examined on 03/13/2024   Brief hospital course: Reginald Fernandez was admitted to the hospital with the working diagnosis of heart failure exacerbation.   Patient 43 year old gentleman history of CAD, hypertension, hyperlipidemia, morbid obesity, history of PE and medication nonadherence due to financial constraints presented to the ED with abdominal pain, constipation, and dyspnea.  Patient reported 6 days of worsening symptoms prior to admission, along with a 20 lbs weight gain since June 2025.  On his initial physical examination his blood pressure was 164/101, HR 82, RR 17 and 02 saturation 98% Lungs with no wheezing or rhonchi, heart with S1 and S2 present and regular with no gallops, abdomen protuberant, soft and non tender, no lower extremity edema.   Patient placed on IV furosemide  for diuresis.  12/19 noted low output state and placed on IV milrinone  for inotropic support.    12/23 cardiac catheterization, normal LV filling pressure, but persistent low output failure due to biventricular dysfunction.  12/25 pending arrangement for outpatient home inotropic support, possible referral for heart transplant at tertiary care.  12/26 lifeVest has been approved, pending home milrinone    Assessment and Plan: * Acute on chronic systolic CHF (congestive heart failure) (HCC) Echocardiogram with reduced LV systolic function with EF < 20 %, global hypokinesis, moderate dilatated cavity, grade II diastolic dysfunction with pseudo normalization EA, severe reduction of RV systolic function, LA with severe dilation, RA with mild dilatation, no significant valvular disease.   Urine output is 2,600 ml Systolic blood pressure 110 to 140 mmHg.  SV02 59.6   Plan to continue medical therapy with SGLT 2 inh, digoxin , bidil , entresto , and sprionolactone Inotropic support with milrinone   0.125 mcg/kg/min  Loop diuretic with torsemide  40 mg po daily.   NSVT/ PVC Continue with amiodarone  and mexiletine   Plan for  home inotropic support and follow up as outpatient for possible heart transplant at tertiary care center.  lifeVest has been approved   CAD S/P percutaneous coronary angioplasty: DES PCI -Cx-OM1, RCA x 2 Elevation in high sensitive troponin due to heart failure decompensation  No chest pain, no acute coronary syndrome Continue as needed nitroglycerin    Essential hypertension Continue blood pressure control with bidil , entresto , spironolactone    Pulmonary emboli (HCC) Continue anticoagulation with apixaban   AKI (acute kidney injury) Hyponatremia,   Renal function today stable with serum cr at 1,19 with K at 4,6 and serum bicarbonate at 26  Na 131 Mg 2.6    Continue diuresis and follow up renal function and electrolytes in am.    Hyperlipidemia LDL goal <70 Continue atorvastatin  and ezetimibe    Obesity, class 2 Calculated BMI 35.3      Subjective: Patient with positive headache, with no chest pain, dyspnea has been improved, no lower extremity edema   Physical Exam: Vitals:   03/13/24 0811 03/13/24 1004 03/13/24 1158 03/13/24 1215  BP: (!) 142/99  (!) 130/103 (!) 132/99  Pulse: 81 83    Resp: 19  19 17   Temp: (!) 97.4 F (36.3 C)  97.7 F (36.5 C)   TempSrc: Oral  Oral   SpO2:      Weight:      Height:       Neurology awake and alert ENT with mild pallor with no icterus Cardiovascular with S1 and S2 present and regular with no gallops, rubs or murmurs Respiratory with  no wheezing or rhonchi Abdomen with no distention, soft and non tender No lower extremity edema   Data Reviewed:    Family Communication: no family at the bedside   Disposition: Status is: Inpatient Remains inpatient appropriate because: IV inotropic therapy   Planned Discharge Destination: Home     Author: Elidia Toribio Furnace, MD 03/13/2024 4:33  PM  For on call review [email or web address].  "

## 2024-03-13 NOTE — Progress Notes (Addendum)
 "  Advanced Heart Failure Rounding Note  Chief Complaint: Low output HF Significant Events:    12/23: RHC w RA 6, PA 36/22 (29), PCW 12, TD CO/CI 4.3/1.7, PVR 3.5 WU, PAPi 2.3  Subjective:    Co-ox 60% on milrinone . Weight stable. Remains with HA, but improving. Otherwise feels well.  Objective:    Vital Signs:   Temp:  [97.4 F (36.3 C)-98.5 F (36.9 C)] 97.4 F (36.3 C) (12/26 0811) Pulse Rate:  [72-88] 81 (12/26 0811) Resp:  [19-20] 19 (12/26 0811) BP: (117-142)/(82-101) 142/99 (12/26 0811) SpO2:  [93 %-100 %] 100 % (12/26 0420) Weight:  [880 kg] 119 kg (12/26 0420) Last BM Date : 03/11/24  Weight change: Filed Weights   03/11/24 0338 03/12/24 0524 03/13/24 0420  Weight: 124.8 kg 118.4 kg 119 kg   Intake/Output:  Intake/Output Summary (Last 24 hours) at 03/13/2024 0852 Last data filed at 03/13/2024 0700 Gross per 24 hour  Intake 240 ml  Output 2600 ml  Net -2360 ml    Physical Exam: General: Well appearing. No distress  Cardiac: JVP flat. No murmurs  Extremities: Warm and dry.  No edema.  Neuro: A&O x3. Affect pleasant.   Telemetry: SR 80s (personally reviewed)  Labs: Basic Metabolic Panel: Recent Labs  Lab 03/09/24 0500 03/10/24 0530 03/10/24 1018 03/11/24 0453 03/11/24 0500 03/12/24 0603 03/13/24 0500  NA 130* 131* 136  136  --  130* 132* 131*  K 4.8 5.0 4.8  4.9  --  4.9 4.7 4.6  CL 92* 96*  --   --  93* 94* 94*  CO2 25 25  --   --  24 25 26   GLUCOSE 213* 138*  --   --  172* 113* 114*  BUN 24* 24*  --   --  24* 22* 24*  CREATININE 1.33* 1.19  --   --  1.26* 1.21 1.19  CALCIUM  9.3 9.1  --   --  9.0 9.0 9.1  MG 2.6* 2.7*  --  2.5*  --  2.4 2.6*    Liver Function Tests: Recent Labs  Lab 03/07/24 0500 03/08/24 0500 03/09/24 0500  AST 27 27 29   ALT 26 26 27   ALKPHOS 81 86 94  BILITOT 3.2* 2.7* 1.9*  PROT 7.6 8.0 8.9*  ALBUMIN 4.3 4.4 4.8   No results for input(s): LIPASE, AMYLASE in the last 168 hours.  No results for  input(s): AMMONIA in the last 168 hours.  CBC: Recent Labs  Lab 03/07/24 0500 03/08/24 0500 03/09/24 0500 03/10/24 0530 03/10/24 1018 03/11/24 0453  WBC 9.5 9.4 9.2 8.8  --  9.7  HGB 15.6 16.5 17.5* 17.1* 18.0*  18.0* 16.9  HCT 45.3 47.1 51.0 50.5 53.0*  53.0* 50.3  MCV 91.9 90.9 90.9 91.7  --  92.6  PLT 270 268 317 301  --  288   BNP (last 3 results) Recent Labs    08/03/23 1028  BNP 892.8*   ProBNP (last 3 results) Recent Labs    03/04/24 1833  PROBNP 3,698.0*   Medications:    Scheduled Medications:  amiodarone   400 mg Oral BID   apixaban   5 mg Oral BID   atorvastatin   80 mg Oral q1800   Chlorhexidine  Gluconate Cloth  6 each Topical Daily   dapagliflozin  propanediol  10 mg Oral Daily   digoxin   0.0625 mg Oral Daily   ezetimibe   10 mg Oral Daily   mexiletine  300 mg Oral BID   pantoprazole   40 mg Oral Daily   sacubitril -valsartan   1 tablet Oral BID   sodium chloride  flush  10-40 mL Intracatheter Q12H   sodium chloride  flush  3 mL Intravenous Q12H   spironolactone   25 mg Oral Daily   torsemide   40 mg Oral Daily    Infusions:  milrinone  0.125 mcg/kg/min (03/13/24 0315)    PRN Medications: acetaminophen , HYDROmorphone  (DILAUDID ) injection, methocarbamol , naLOXone  (NARCAN )  injection, nitroGLYCERIN , ondansetron  (ZOFRAN ) IV, polyethylene glycol, senna-docusate, sodium chloride  flush, sodium chloride  flush, traMADol   Assessment/Plan:   A/C Biventricular HFrEF, ICM - LV/RV severely reduced for the last couple of years. Followed in the HF clinic with previous discussions about advanced therapies. We are still concerned he may need advanced therapies. Suspect abdominal issues are related to low output HF.  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 - RHC 12/23 with normal filling pressures with severely low cardiac output and PH - ECHO: EF < 20% RV severely decreased  - Co-ox 60% on milrinone  0.125 - Continue digoxin  0.125 mg daily  - Continue entresto  24-26 mg  bid - Continue spiro 25 mg daily   - Continue farxiga  10 mg daily - Stop bidil  with severe HA - Blood type B+ - w/u for advanced therapies. Will refer to [facility] as outpatient for transplant. Home milrinone  and LifeVest - Plan in place to get home milrinone  and LifeVest. SW has reached out to [employer]. Will reach back out to SW today, to determine where we are in this process  2. NSVT/PVCs   - High PVC burden initially, esp while on milrinone  - Continue amio 400 mg bid + mexitil  300 mg bid - Keep K >4 and Mg >2. Stable.  - TSH ok - no change   Abdominal Pain - CT negative. Suspect this is related to low output heart failure.  - resolved   CAD  - Strong family history of premature CAD.  H/o inferolateral STEMI in 9/15 with DES to LCx into OM1 and DES to RCA.  LHC 7/23 demonstrated diffuse CAD but no severe stenosis to explain worsening of his EF.   - No s/s angina - Continue atorvastatin  and zetia    H/O PE/DVT - Continue Eliquis   Length of Stay: 7  Jordan Lee, MD 03/13/2024, 8:52 AM  Advanced Heart Failure Team Pager [phone] (M-F; 7a - 4p)  Please contact [facility] for night-coverage after hours (4p -7a ) and weekends on [email or web address]   Patient seen and examined with the above-signed Advanced Practice Provider and/or Housestaff. I personally reviewed laboratory data, imaging studies and relevant notes. I independently examined the patient and formulated the important aspects of the plan. I have edited the note to reflect any of my changes or salient points. I have personally discussed the plan with the patient and/or family.  Feels ok. HA resolving. Co-ox and CVP stable on milrinone   General:  Sitting up in bed. No resp difficulty HEENT: normal Neck: supple. no JVD.  Cor: Regular rate & rhythm. No rubs, gallops or murmurs. Lungs: clear Abdomen: soft, nontender, nondistended.Good bowel sounds. Extremities: no cyanosis, clubbing, rash, edema Neuro: alert & orientedx3, cranial  nerves grossly intact. moves all 4 extremities w/o difficulty. Affect pleasant  Working with SW to arrange home milrinone  and LifeVest. Appreciate their efforts.   Will need referral to [facility] for transplant w/u  Toribio Fuel, MD  12:37 PM    "

## 2024-03-13 NOTE — TOC Progression Note (Addendum)
 Transition of Care [facility]) - Progression Note    Patient Details  Name: Reginald Fernandez MRN: [id number] Date of Birth: 1980/06/04  [facility] CM/SW Contact  Waddell Barnie Rama, RN Phone Number: 03/13/2024, 10:08 AM  Clinical Narrative:    NCM called Deatrice with [employer], he states he had not received the approval yet, then he stated he had just received a denial.  NCM let Deatrice speak with patient,  patient does not have his insurance card with him. He states he does have insurance with [facility] and he can not get anyone on the phone.  NCM informed him they are probably closed today.  Deatrice spoke with patient and asked if he would be interested in a pmt plan where he would have to put a down pmt on the life vest so that he could be fitted.  Deatrice will send the paper work over to him today.   NCM spoke with Holley Luo, with [employer], Pam is waiting on an auth for the home milrinone  as well.  She states she does not know when they will get the auth.  He may have to pay privately.   1455-  Per Deatrice we have received auth for the life vest and will hopefully be fitted today, now we are awaiting auth for milrinone .  Per Pam with [employer] states it will probably be next week when they get the auth with his insurance.    Expected Discharge Plan: Home w Home Health Services Barriers to Discharge: Continued Medical Work up               Expected Discharge Plan and Services   Discharge Planning Services: CM Consult Post Acute Care Choice: Home Health Living arrangements for the past 2 months: Single Family Home                 DME Arranged: Life vest DME Agency: [employer] Date DME Agency Contacted: 03/12/24 Time DME Agency Contacted: 1007 Representative spoke with at DME Agency: Alm [facility] Arranged: RN HH Agency: [employer] Date HH Agency Contacted: 03/10/24 Time HH Agency Contacted: 1100 Representative spoke with at [facility] Agency: Holley Herring   Social Drivers of Health  (SDOH) Interventions SDOH Screenings   Food Insecurity: No Food Insecurity (03/07/2024)  Housing: Low Risk (03/07/2024)  Transportation Needs: No Transportation Needs (03/07/2024)  Utilities: Not At Risk (03/07/2024)  Depression (PHQ2-9): Low Risk (06/27/2022)  Social Connections: Unknown (03/07/2024)  Tobacco Use: Medium Risk (03/04/2024)    Readmission Risk Interventions     No data to display

## 2024-03-14 DIAGNOSIS — I2510 Atherosclerotic heart disease of native coronary artery without angina pectoris: Secondary | ICD-10-CM | POA: Diagnosis not present

## 2024-03-14 DIAGNOSIS — I5023 Acute on chronic systolic (congestive) heart failure: Secondary | ICD-10-CM | POA: Diagnosis not present

## 2024-03-14 DIAGNOSIS — I10 Essential (primary) hypertension: Secondary | ICD-10-CM | POA: Diagnosis not present

## 2024-03-14 DIAGNOSIS — I2694 Multiple subsegmental pulmonary emboli without acute cor pulmonale: Secondary | ICD-10-CM | POA: Diagnosis not present

## 2024-03-14 LAB — BASIC METABOLIC PANEL WITH GFR
Anion gap: 11 (ref 5–15)
BUN: 26 mg/dL — ABNORMAL HIGH (ref 6–20)
CO2: 26 mmol/L (ref 22–32)
Calcium: 8.8 mg/dL — ABNORMAL LOW (ref 8.9–10.3)
Chloride: 92 mmol/L — ABNORMAL LOW (ref 98–111)
Creatinine, Ser: 1.19 mg/dL (ref 0.61–1.24)
GFR, Estimated: >60 mL/min
Glucose, Bld: 141 mg/dL — ABNORMAL HIGH (ref 70–99)
Potassium: 4.4 mmol/L (ref 3.5–5.1)
Sodium: 129 mmol/L — ABNORMAL LOW (ref 135–145)

## 2024-03-14 LAB — MAGNESIUM: Magnesium: 2.3 mg/dL (ref 1.7–2.4)

## 2024-03-14 LAB — COOXEMETRY PANEL
Carboxyhemoglobin: 1.5 % (ref 0.5–1.5)
Methemoglobin: <0.7 % (ref 0.0–1.5)
O2 Saturation: 66.5 %
Total hemoglobin: 18.1 g/dL — ABNORMAL HIGH (ref 12.0–16.0)

## 2024-03-14 LAB — GLUCOSE, CAPILLARY: Glucose-Capillary: 117 mg/dL — ABNORMAL HIGH (ref 70–99)

## 2024-03-14 NOTE — Progress Notes (Signed)
 Patient complaining of hot flashes and feeling off. He stated that the hot flashes have been happening for two days. Vitals are normal and CBG is 117. Patient states that his head ache is relieved with Tramadol  but is feeling nauseous as well. Dr Noralee informed.   Zofran  administered for nausea.

## 2024-03-14 NOTE — Progress Notes (Signed)
 "  Advanced Heart Failure Rounding Note  Chief Complaint: Low output HF Significant Events:    12/23: RHC w RA 6, PA 36/22 (29), PCW 12, TD CO/CI 4.3/1.7, PVR 3.5 WU, PAPi 2.3  Subjective:    Co-ox 67% on milrinone . CVP 2-3  Feels ok. HA resolved.    LifeVest not delivered yet   Objective:    Vital Signs:   Temp:  [97.4 F (36.3 C)-97.8 F (36.6 C)] 97.4 F (36.3 C) (12/27 1549) Pulse Rate:  [79-96] 86 (12/27 1549) Resp:  [16-20] 16 (12/27 1549) BP: (115-139)/(75-100) 116/75 (12/27 1549) SpO2:  [98 %] 98 % (12/27 0742) Weight:  [118.3 kg] 118.3 kg (12/27 0356) Last BM Date : 03/13/24  Weight change: Filed Weights   03/12/24 0524 03/13/24 0420 03/14/24 0356  Weight: 118.4 kg 119 kg 118.3 kg   Intake/Output:  Intake/Output Summary (Last 24 hours) at 03/14/2024 1620 Last data filed at 03/14/2024 1218 Gross per 24 hour  Intake 658 ml  Output 2050 ml  Net -1392 ml    Physical Exam: General:  Sitting up in bed. No resp difficulty HEENT: normal Neck: supple. no JVD.  Cor: Regular rate & rhythm. No rubs, gallops or murmurs. Lungs: clear Abdomen: soft, nontender, nondistended.Good bowel sounds. Extremities: no cyanosis, clubbing, rash, edema Neuro: alert & orientedx3, cranial nerves grossly intact. moves all 4 extremities w/o difficulty. Affect pleasant   Telemetry:SR 80s (personally reviewed)  Labs: Basic Metabolic Panel: Recent Labs  Lab 03/10/24 0530 03/10/24 1018 03/11/24 0453 03/11/24 0500 03/12/24 0603 03/13/24 0500 03/14/24 0508  NA 131* 136  136  --  130* 132* 131* 129*  K 5.0 4.8  4.9  --  4.9 4.7 4.6 4.4  CL 96*  --   --  93* 94* 94* 92*  CO2 25  --   --  24 25 26 26   GLUCOSE 138*  --   --  172* 113* 114* 141*  BUN 24*  --   --  24* 22* 24* 26*  CREATININE 1.19  --   --  1.26* 1.21 1.19 1.19  CALCIUM  9.1  --   --  9.0 9.0 9.1 8.8*  MG 2.7*  --  2.5*  --  2.4 2.6* 2.3    Liver Function Tests: Recent Labs  Lab 03/08/24 0500  03/09/24 0500  AST 27 29  ALT 26 27  ALKPHOS 86 94  BILITOT 2.7* 1.9*  PROT 8.0 8.9*  ALBUMIN 4.4 4.8   No results for input(s): LIPASE, AMYLASE in the last 168 hours.  No results for input(s): AMMONIA in the last 168 hours.  CBC: Recent Labs  Lab 03/08/24 0500 03/09/24 0500 03/10/24 0530 03/10/24 1018 03/11/24 0453  WBC 9.4 9.2 8.8  --  9.7  HGB 16.5 17.5* 17.1* 18.0*  18.0* 16.9  HCT 47.1 51.0 50.5 53.0*  53.0* 50.3  MCV 90.9 90.9 91.7  --  92.6  PLT 268 317 301  --  288   BNP (last 3 results) Recent Labs    08/03/23 1028  BNP 892.8*   ProBNP (last 3 results) Recent Labs    03/04/24 1833  PROBNP 3,698.0*   Medications:    Scheduled Medications:  amiodarone   400 mg Oral BID   apixaban   5 mg Oral BID   atorvastatin   80 mg Oral q1800   Chlorhexidine  Gluconate Cloth  6 each Topical Daily   dapagliflozin  propanediol  10 mg Oral Daily   digoxin   0.0625 mg Oral Daily  ezetimibe   10 mg Oral Daily   mexiletine  300 mg Oral BID   pantoprazole   40 mg Oral Daily   sacubitril -valsartan   1 tablet Oral BID   sodium chloride  flush  10-40 mL Intracatheter Q12H   sodium chloride  flush  3 mL Intravenous Q12H   spironolactone   25 mg Oral Daily   torsemide   40 mg Oral Daily    Infusions:  milrinone  0.125 mcg/kg/min (03/14/24 0218)    PRN Medications: acetaminophen , HYDROmorphone  (DILAUDID ) injection, methocarbamol , naLOXone  (NARCAN )  injection, nitroGLYCERIN , ondansetron  (ZOFRAN ) IV, polyethylene glycol, senna-docusate, sodium chloride  flush, sodium chloride  flush, traMADol   Assessment/Plan:   A/C Biventricular HFrEF, ICM - LV/RV severely reduced for the last couple of years. Followed in the HF clinic with previous discussions about advanced therapies. We are still concerned he may need advanced therapies. Suspect abdominal issues are related to low output HF.  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 - RHC 12/23 with normal filling pressures with severely low  cardiac output and PH - ECHO: EF < 20% RV severely decreased  - Co-ox 67% on milrinone  0.125. Continue  - Continue digoxin  0.125 mg daily  - Continue entresto  24-26 mg bid - Continue spiro 25 mg daily   - Continue farxiga  10 mg daily - Failed Bidil  with severe HA - Blood type B+ - w/u for advanced therapies. Will refer to [facility] as outpatient for transplant. Home milrinone  and LifeVest - Plan in place to get home milrinone  and LifeVest. LifeVest approved. Waiting fitting. SW awaiting approval for home milrinone . Can go home tomorrow if we can get home milrinone  set up. Would plan tunneled PICC if approved. I discussed the plan with him again today.   2. NSVT/PVCs   - High PVC burden initially, esp while on milrinone  - Continue amio 400 mg bid + mexitil  300 mg bid - Keep K >4 and Mg >2. Stable.  - TSH ok - continue current therapy   Abdominal Pain - CT negative. Suspect this is related to low output heart failure.  - Resolved   CAD  - Strong family history of premature CAD.  H/o inferolateral STEMI in 9/15 with DES to LCx into OM1 and DES to RCA.  LHC 7/23 demonstrated diffuse CAD but no severe stenosis to explain worsening of his EF.   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 No bleeding   Length of Stay: 8  Toribio Fuel, MD 03/14/2024, 4:20 PM  Advanced Heart Failure Team Pager [phone] (M-F; 7a - 4p)  Please contact [facility] for night-coverage after hours (4p -7a ) and weekends on [email or web address]     "

## 2024-03-14 NOTE — Progress Notes (Signed)
 " Progress Note   Patient: Reginald Fernandez FMW:[id number] DOB: [birth year] DOA: 03/04/2024     8 DOS: the patient was seen and examined on 03/14/2024   Brief hospital course: [name] was admitted to the hospital with the working diagnosis of heart failure exacerbation.   Patient 43 year old gentleman history of CAD, hypertension, hyperlipidemia, morbid obesity, history of PE and medication nonadherence due to financial constraints presented to the ED with abdominal pain, constipation, and dyspnea.  Patient reported 6 days of worsening symptoms prior to admission, along with a 20 lbs weight gain since June 2025.  On his initial physical examination his blood pressure was 164/101, HR 82, RR 17 and 02 saturation 98% Lungs with no wheezing or rhonchi, heart with S1 and S2 present and regular with no gallops, abdomen protuberant, soft and non tender, no lower extremity edema.   Patient placed on IV furosemide  for diuresis.  12/19 noted low output state and placed on IV milrinone  for inotropic support.    12/23 cardiac catheterization, normal LV filling pressure, but persistent low output failure due to biventricular dysfunction.  12/25 pending arrangement for outpatient home inotropic support, possible referral for heart transplant at tertiary care.  12/26 lifeVest has been approved, pending home milrinone   12/27 pending approval of  home inotropic support   Assessment and Plan: * Acute on chronic systolic CHF (congestive heart failure) (HCC) Echocardiogram with reduced LV systolic function with EF < 20 %, global hypokinesis, moderate dilatated cavity, grade II diastolic dysfunction with pseudo normalization EA, severe reduction of RV systolic function, LA with severe dilation, RA with mild dilatation, no significant valvular disease.   Urine output is 1,400 ml Systolic blood pressure 130 mmHg.  SV02 66.5   Plan to continue medical therapy with SGLT 2 inh, digoxin , bidil , entresto , and  sprionolactone Inotropic support with milrinone  0.125 mcg/kg/min  Loop diuretic with torsemide  40 mg po daily.   NSVT/ PVC Continue with amiodarone  and mexiletine   Plan for  home inotropic support and follow up as outpatient for possible heart transplant at tertiary care center.  lifeVest has been approved   CAD S/P percutaneous coronary angioplasty: DES PCI -Cx-OM1, RCA x 2 Elevation in high sensitive troponin due to heart failure decompensation  No chest pain, no acute coronary syndrome Continue as needed nitroglycerin    Essential hypertension Continue blood pressure control with bidil , entresto , spironolactone    Pulmonary emboli (HCC) Continue anticoagulation with apixaban   AKI (acute kidney injury) Hyponatremia,   Stable renal function with serum cr at 1,19 with K at 4,4 and serum bicarbonate at 26  Na 129 Mg 2.3    Continue diuresis and follow up renal function and electrolytes in am.    Hyperlipidemia LDL goal <70 Continue atorvastatin  and ezetimibe    Obesity, class 2 Calculated BMI 35.3      Subjective: Patient with no chest pain and no dyspnea, this morning his headache has improved and is having intermittent hot flashes   Physical Exam: Vitals:   03/14/24 0220 03/14/24 0356 03/14/24 0742 03/14/24 1200  BP: (!) 131/93 (!) 139/93 (!) 120/97   Pulse: 79  82   Resp:   16   Temp:  (!) 97.4 F (36.3 C) 97.8 F (36.6 C)   TempSrc:  Oral Oral (P) Oral  SpO2:   98%   Weight:  118.3 kg    Height:       Neurology awake and alert ENT with no pallor Cardiovascular with S1 and S2 present and  regular with no gallops, rubs or murmurs Respiratory with no rales or wheezing, no rhonchi Abdomen with no distention, soft and non tender No lower extremity edema   Data Reviewed:    Family Communication: no family at the bedside   Disposition: Status is: Inpatient Remains inpatient appropriate because: pending home milrinone  arrangements   Planned Discharge  Destination: Home     Author: Elidia Toribio Furnace, MD 03/14/2024 2:19 PM  For on call review [email or web address].  "

## 2024-03-14 NOTE — Plan of Care (Signed)
" °  Problem: Education: Goal: Ability to demonstrate management of disease process will improve Outcome: Progressing Goal: Ability to verbalize understanding of medication therapies will improve Outcome: Progressing Goal: Individualized Educational Video(s) Outcome: Progressing   Problem: Activity: Goal: Capacity to carry out activities will improve Outcome: Progressing   Problem: Cardiac: Goal: Ability to achieve and maintain adequate cardiopulmonary perfusion will improve Outcome: Progressing   Problem: Education: Goal: Knowledge of General Education information will improve Description: Including pain rating scale, medication(s)/side effects and non-pharmacologic comfort measures Outcome: Progressing   Problem: Health Behavior/Discharge Planning: Goal: Ability to manage health-related needs will improve Outcome: Progressing   Problem: Clinical Measurements: Goal: Ability to maintain clinical measurements within normal limits will improve Outcome: Progressing Goal: Will remain free from infection Outcome: Progressing Goal: Diagnostic test results will improve Outcome: Progressing Goal: Respiratory complications will improve Outcome: Progressing Goal: Cardiovascular complication will be avoided Outcome: Progressing   Problem: Activity: Goal: Risk for activity intolerance will decrease Outcome: Progressing   Problem: Nutrition: Goal: Adequate nutrition will be maintained Outcome: Progressing   Problem: Coping: Goal: Level of anxiety will decrease Outcome: Progressing   Problem: Elimination: Goal: Will not experience complications related to bowel motility Outcome: Progressing Goal: Will not experience complications related to urinary retention Outcome: Progressing   Problem: Pain Managment: Goal: General experience of comfort will improve and/or be controlled Outcome: Progressing   Problem: Safety: Goal: Ability to remain free from injury will  improve Outcome: Progressing   Problem: Skin Integrity: Goal: Risk for impaired skin integrity will decrease Outcome: Progressing   Problem: Education: Goal: Understanding of CV disease, CV risk reduction, and recovery process will improve Outcome: Progressing Goal: Individualized Educational Video(s) Outcome: Progressing   Problem: Activity: Goal: Ability to return to baseline activity level will improve Outcome: Progressing   Problem: Cardiovascular: Goal: Ability to achieve and maintain adequate cardiovascular perfusion will improve Outcome: Progressing Goal: Vascular access site(s) Level 0-1 will be maintained Outcome: Progressing   Problem: Health Behavior/Discharge Planning: Goal: Ability to safely manage health-related needs after discharge will improve Outcome: Progressing   Problem: Education: Goal: Ability to demonstrate management of disease process will improve Outcome: Progressing Goal: Ability to verbalize understanding of medication therapies will improve Outcome: Progressing   Problem: Activity: Goal: Capacity to carry out activities will improve Outcome: Progressing   Problem: Cardiac: Goal: Ability to achieve and maintain adequate cardiopulmonary perfusion will improve Outcome: Progressing   "

## 2024-03-14 NOTE — Plan of Care (Signed)
" °  Problem: Cardiac: Goal: Ability to achieve and maintain adequate cardiopulmonary perfusion will improve Outcome: Progressing   Problem: Education: Goal: Knowledge of General Education information will improve Description: Including pain rating scale, medication(s)/side effects and non-pharmacologic comfort measures Outcome: Progressing   Problem: Health Behavior/Discharge Planning: Goal: Ability to manage health-related needs will improve Outcome: Progressing   Problem: Clinical Measurements: Goal: Diagnostic test results will improve Outcome: Progressing Goal: Cardiovascular complication will be avoided Outcome: Progressing   Problem: Pain Managment: Goal: General experience of comfort will improve and/or be controlled Outcome: Progressing   "

## 2024-03-15 ENCOUNTER — Other Ambulatory Visit (HOSPITAL_COMMUNITY): Payer: Self-pay

## 2024-03-15 DIAGNOSIS — I5023 Acute on chronic systolic (congestive) heart failure: Secondary | ICD-10-CM | POA: Diagnosis not present

## 2024-03-15 DIAGNOSIS — I10 Essential (primary) hypertension: Secondary | ICD-10-CM | POA: Diagnosis not present

## 2024-03-15 DIAGNOSIS — I2694 Multiple subsegmental pulmonary emboli without acute cor pulmonale: Secondary | ICD-10-CM | POA: Diagnosis not present

## 2024-03-15 DIAGNOSIS — I2510 Atherosclerotic heart disease of native coronary artery without angina pectoris: Secondary | ICD-10-CM | POA: Diagnosis not present

## 2024-03-15 LAB — BASIC METABOLIC PANEL WITH GFR
Anion gap: 10 (ref 5–15)
BUN: 27 mg/dL — ABNORMAL HIGH (ref 6–20)
CO2: 28 mmol/L (ref 22–32)
Calcium: 9.2 mg/dL (ref 8.9–10.3)
Chloride: 94 mmol/L — ABNORMAL LOW (ref 98–111)
Creatinine, Ser: 1.31 mg/dL — ABNORMAL HIGH (ref 0.61–1.24)
GFR, Estimated: >60 mL/min
Glucose, Bld: 138 mg/dL — ABNORMAL HIGH (ref 70–99)
Potassium: 4.5 mmol/L (ref 3.5–5.1)
Sodium: 132 mmol/L — ABNORMAL LOW (ref 135–145)

## 2024-03-15 LAB — COOXEMETRY PANEL
Carboxyhemoglobin: 1 % (ref 0.5–1.5)
Carboxyhemoglobin: 1.5 % (ref 0.5–1.5)
Methemoglobin: <0.7 % (ref 0.0–1.5)
Methemoglobin: <0.7 % (ref 0.0–1.5)
O2 Saturation: 47.8 %
O2 Saturation: 61.8 %
Total hemoglobin: 17.4 g/dL — ABNORMAL HIGH (ref 12.0–16.0)
Total hemoglobin: 18.7 g/dL — ABNORMAL HIGH (ref 12.0–16.0)

## 2024-03-15 LAB — MAGNESIUM: Magnesium: 2.5 mg/dL — ABNORMAL HIGH (ref 1.7–2.4)

## 2024-03-15 MED ORDER — GUAIFENESIN ER 600 MG PO TB12
600.0000 mg | ORAL_TABLET | Freq: Two times a day (BID) | ORAL | Status: DC
  Administered 2024-03-15 – 2024-03-19 (×8): 600 mg via ORAL
  Filled 2024-03-15: qty 1

## 2024-03-15 NOTE — Progress Notes (Addendum)
 Patient ID: Reginald Fernandez, male   DOB: 07-Mar-1981, 43 y.o.   MRN: [id number]   Advanced Heart Failure Rounding Note  Chief Complaint: Low output HF Significant Events:    12/23: RHC w RA 6, PA 36/22 (29), PCW 12, TD CO/CI 4.3/1.7, PVR 3.5 WU, PAPi 2.3  Subjective:    Co-ox early am 48% on milrinone  0.125. CVP 2. Feels fine today.   LifeVest arrived today.   Objective:    Vital Signs:   Temp:  [97.4 F (36.3 C)-99.7 F (37.6 C)] 98.1 F (36.7 C) (12/28 1203) Pulse Rate:  [82-91] 84 (12/28 1203) Resp:  [16-17] 17 (12/28 1203) BP: (99-132)/(67-99) 119/67 (12/28 1203) SpO2:  [99 %-100 %] 99 % (12/28 0100) Weight:  [117.8 kg] 117.8 kg (12/28 0415) Last BM Date : 03/14/24  Weight change: Filed Weights   03/13/24 0420 03/14/24 0356 03/15/24 0415  Weight: 119 kg 118.3 kg 117.8 kg   Intake/Output:  Intake/Output Summary (Last 24 hours) at 03/15/2024 1249 Last data filed at 03/15/2024 1202 Gross per 24 hour  Intake 1400.22 ml  Output 2325 ml  Net -924.78 ml    Physical Exam: General: NAD Neck: No JVD, no thyromegaly or thyroid nodule.  Lungs: Clear to auscultation bilaterally with normal respiratory effort. CV: Lateral PMI.  Heart regular S1/S2, no S3/S4, no murmur.  No peripheral edema.   Abdomen: Soft, nontender, no hepatosplenomegaly, no distention.  Skin: Intact without lesions or rashes.  Neurologic: Alert and oriented x 3.  Psych: Normal affect. Extremities: No clubbing or cyanosis.  HEENT: Normal.   Telemetry:SR 80s (personally reviewed)  Labs: Basic Metabolic Panel: Recent Labs  Lab 03/11/24 0453 03/11/24 0500 03/12/24 0603 03/13/24 0500 03/14/24 0508 03/15/24 0538  NA  --  130* 132* 131* 129* 132*  K  --  4.9 4.7 4.6 4.4 4.5  CL  --  93* 94* 94* 92* 94*  CO2  --  24 25 26 26 28   GLUCOSE  --  172* 113* 114* 141* 138*  BUN  --  24* 22* 24* 26* 27*  CREATININE  --  1.26* 1.21 1.19 1.19 1.31*  CALCIUM   --  9.0 9.0 9.1 8.8* 9.2  MG 2.5*  --  2.4  2.6* 2.3 2.5*    Liver Function Tests: Recent Labs  Lab 03/09/24 0500  AST 29  ALT 27  ALKPHOS 94  BILITOT 1.9*  PROT 8.9*  ALBUMIN 4.8   No results for input(s): LIPASE, AMYLASE in the last 168 hours.  No results for input(s): AMMONIA in the last 168 hours.  CBC: Recent Labs  Lab 03/09/24 0500 03/10/24 0530 03/10/24 1018 03/11/24 0453  WBC 9.2 8.8  --  9.7  HGB 17.5* 17.1* 18.0*  18.0* 16.9  HCT 51.0 50.5 53.0*  53.0* 50.3  MCV 90.9 91.7  --  92.6  PLT 317 301  --  288   BNP (last 3 results) Recent Labs    08/03/23 1028  BNP 892.8*   ProBNP (last 3 results) Recent Labs    03/04/24 1833  PROBNP 3,698.0*   Medications:    Scheduled Medications:  amiodarone   400 mg Oral BID   apixaban   5 mg Oral BID   atorvastatin   80 mg Oral q1800   Chlorhexidine  Gluconate Cloth  6 each Topical Daily   dapagliflozin  propanediol  10 mg Oral Daily   digoxin   0.0625 mg Oral Daily   ezetimibe   10 mg Oral Daily   mexiletine  300 mg Oral  BID   pantoprazole   40 mg Oral Daily   sacubitril -valsartan   1 tablet Oral BID   sodium chloride  flush  10-40 mL Intracatheter Q12H   sodium chloride  flush  3 mL Intravenous Q12H   spironolactone   25 mg Oral Daily   torsemide   40 mg Oral Daily    Infusions:  milrinone  0.125 mcg/kg/min (03/15/24 0001)    PRN Medications: acetaminophen , HYDROmorphone  (DILAUDID ) injection, methocarbamol , naLOXone  (NARCAN )  injection, nitroGLYCERIN , ondansetron  (ZOFRAN ) IV, polyethylene glycol, senna-docusate, sodium chloride  flush, sodium chloride  flush, traMADol   Assessment/Plan:   A/C Biventricular HFrEF, ICM +/- NICM component.  - LV/RV severely reduced for the last couple of years. Followed in the HF clinic with previous discussions about advanced therapies. We are still concerned he may need advanced therapies. Suspect abdominal issues are related to low output HF.  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 - RHC 12/23 with normal filling  pressures with severely low cardiac output and PH - ECHO: EF < 20% with RV severely decreased systolic function.  - Co-ox lower early am at 48% on milrinone  0.125.  Will repeat co-ox today, increase milrinone  to 0.25 if co-ox < 55% on repeat => repeat 62%, ok.  - CVP 2, will hold torsemide  for now, probably restart lower dose 20 mg daily at discharge.  - Continue digoxin  0.0625 mg daily  - Continue entresto  24-26 mg bid, will not increase with SBP 90s at times.  - Continue spiro 25 mg daily   - Continue farxiga  10 mg daily - Failed Bidil  with severe HA - Blood type B+ - w/u for advanced therapies. Will refer to [facility] as outpatient for transplant.  - Plan in place to get home milrinone  and LifeVest. LifeVest approved. SW awaiting approval for home milrinone . I will arrange for tunneled PICC tomorrow and can go home after that.   2. NSVT/PVCs   - High PVC burden initially, esp while on milrinone  - Continue amio 400 mg bid (taper to 200 daily as outpatient) + mexitil  300 mg bid - Keep K >4 and Mg >2. Stable.  - TSH ok - continue current therapy   Abdominal Pain - CT negative. Suspect this is related to low output heart failure.  - Resolved   CAD  - Strong family history of premature CAD.  H/o inferolateral STEMI in 9/15 with DES to LCx into OM1 and DES to RCA.  LHC 7/23 demonstrated diffuse CAD but no severe stenosis to explain worsening of his EF.   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 No bleeding   Length of Stay: 9  [name], MD 03/15/2024, 12:49 PM  Advanced Heart Failure Team Pager [phone] (M-F; 7a - 4p)  Please contact [facility] for night-coverage after hours (4p -7a ) and weekends on [email or web address]

## 2024-03-15 NOTE — Consult Note (Signed)
"    ° °  Patient Status: [facility] - In-pt  Assessment and Plan: Patient in need of venous access for home milrinone . Patient with heart failure exacerbation in need of home infusions.   Patient assessed at bedside.  RUA PICC in place currently with plans to convert to tunneled central line as requested by the heart failure team.  Discussed with patient at bedside who is agreeable. He is aware this will be local anesthetic only.  He may eat and drink.  States he is hopeful for d/c tomorrow (12/29).   Risks and benefits discussed with the patient including, but not limited to bleeding, infection, vascular injury, pneumothorax which may require chest tube placement, air embolism or even death  All of the patient's questions were answered, patient is agreeable to proceed. Consent signed and in chart. ______________________________________________________________________   History of Present Illness: Reginald Fernandez is a 43 y.o. male with history of CAD, hypertension, hyperlipidemia, morbid obesity, history of PE and medication nonadherence due to financial constraints presented to the ED with abdominal pain, constipation, and dyspnea. Cardiac cath reveals biventricular dysfunction.  He is in need of home milrinone  infusions via reliable access.  Tunneled CVC requested.  Allergies and medications reviewed.   Review of Systems: A 12 point ROS discussed and pertinent positives are indicated in the HPI above.  All other systems are negative.  Review of Systems  Constitutional:  Positive for fatigue and unexpected weight change.  Respiratory:  Positive for shortness of breath.   Cardiovascular:  Negative for chest pain.  Psychiatric/Behavioral:  Negative for behavioral problems and confusion.     Vital Signs: BP 119/67 (BP Location: Left Arm)   Pulse 84   Temp 98.1 F (36.7 C) (Oral)   Resp 17   Ht 6' 2 (1.88 m)   Wt 259 lb 9.6 oz (117.8 kg)   SpO2 99%   BMI 33.33 kg/m   Physical  Exam Vitals and nursing note reviewed.  Constitutional:      General: He is not in acute distress.    Appearance: He is not ill-appearing.  Cardiovascular:     Rate and Rhythm: Normal rate and regular rhythm.  Skin:    General: Skin is warm and dry.  Neurological:     General: No focal deficit present.     Mental Status: He is alert and oriented to person, place, and time.  Psychiatric:        Mood and Affect: Mood normal.        Behavior: Behavior normal.      Imaging reviewed.   Labs:  COAGS: Recent Labs    08/10/23 1217  APTT 34    BMP: Recent Labs    03/12/24 0603 03/13/24 0500 03/14/24 0508 03/15/24 0538  NA 132* 131* 129* 132*  K 4.7 4.6 4.4 4.5  CL 94* 94* 92* 94*  CO2 25 26 26 28   GLUCOSE 113* 114* 141* 138*  BUN 22* 24* 26* 27*  CALCIUM  9.0 9.1 8.8* 9.2  CREATININE 1.21 1.19 1.19 1.31*  GFRNONAA >60 >60 >60 >60       Electronically Signed: Solmon Selmer Ku, PA 03/15/2024, 2:38 PM   I spent a total of 15 minutes in face to face in clinical consultation, greater than 50% of which was counseling/coordinating care for venous access.   "

## 2024-03-15 NOTE — Care Plan (Signed)
" ° ° ° °  Chart reviewed and updates received from attending provider. No PMT needs at this time. Please reach out to PMT if needs arise.  Thank you for your referral and allowing PMT to assist in [place] R Nodine's care.   Stephane Palin, NP Palliative Medicine Team  Team Phone # [phone]   NO CHARGE  "

## 2024-03-15 NOTE — Progress Notes (Signed)
 Received consult to assess PICC line. Pt c/o of PICC line feeling out of place. Old dressing removed, PICC assessed and had 1cm out. PICC had 1cm out at time of insertion. Changed dressing without incident. Both lumens infusing at time of dressing change. Informed RN that caps should be changed at time of next tubing change or upon discharge since pt states he may be going home tomorrow (12/29). Encouraged to reach back out to IV team with any other IV issues.

## 2024-03-15 NOTE — Progress Notes (Signed)
 " Progress Note   Patient: Reginald Fernandez FMW:[id number] DOB: 01/12/1981 DOA: 03/04/2024     9 DOS: the patient was seen and examined on 03/15/2024   Brief hospital course: Mr. Waldridge was admitted to the hospital with the working diagnosis of heart failure exacerbation.   Patient 43 year old gentleman history of CAD, hypertension, hyperlipidemia, morbid obesity, history of PE and medication nonadherence due to financial constraints presented to the ED with abdominal pain, constipation, and dyspnea.  Patient reported 6 days of worsening symptoms prior to admission, along with a 20 lbs weight gain since June 2025.  On his initial physical examination his blood pressure was 164/101, HR 82, RR 17 and 02 saturation 98% Lungs with no wheezing or rhonchi, heart with S1 and S2 present and regular with no gallops, abdomen protuberant, soft and non tender, no lower extremity edema.   Patient placed on IV furosemide  for diuresis.  12/19 noted low output state and placed on IV milrinone  for inotropic support.    12/23 cardiac catheterization, normal LV filling pressure, but persistent low output failure due to biventricular dysfunction.  12/25 pending arrangement for outpatient home inotropic support, possible referral for heart transplant at tertiary care.  12/26 lifeVest has been approved, pending home milrinone   12/27 pending approval of  home inotropic support  12/28 pending home milrinone    Assessment and Plan: * Acute on chronic systolic CHF (congestive heart failure) (HCC) Echocardiogram with reduced LV systolic function with EF < 20 %, global hypokinesis, moderate dilatated cavity, grade II diastolic dysfunction with pseudo normalization EA, severe reduction of RV systolic function, LA with severe dilation, RA with mild dilatation, no significant valvular disease.   Urine output is 2,425 ml Systolic blood pressure 120 mmHg.  SV02 61.8  Plan to continue medical therapy with SGLT 2 inh, digoxin ,  bidil , entresto , and sprionolactone Inotropic support with milrinone  0.125 mcg/kg/min  Loop diuretic with torsemide  40 mg po daily.   NSVT/ PVC Continue with amiodarone  and mexiletine   Plan for  home inotropic support and follow up as outpatient for possible heart transplant at tertiary care center.  lifeVest has been approved   CAD S/P percutaneous coronary angioplasty: DES PCI -Cx-OM1, RCA x 2 Elevation in high sensitive troponin due to heart failure decompensation  No chest pain, no acute coronary syndrome Continue as needed nitroglycerin    Essential hypertension Continue blood pressure control with bidil , entresto , spironolactone    Pulmonary emboli (HCC) Continue anticoagulation with apixaban   AKI (acute kidney injury) Hyponatremia,   Stable renal function with serum cr at 1,31 with K at 4,5 and serum bicarbonate at 28  Na 132 and mg 2.5   Continue diuresis and follow up renal function and electrolytes in am.    Hyperlipidemia LDL goal <70 Continue atorvastatin  and ezetimibe    Obesity, class 2 Calculated BMI 35.3        Subjective: Patient with no chest pain or dyspnea, no further headache or hot flashes   Physical Exam: Vitals:   03/15/24 0100 03/15/24 0415 03/15/24 0752 03/15/24 1203  BP: (!) 130/94  (!) 132/99 119/67  Pulse: 82  91 84  Resp: 16  17 17   Temp: 98.4 F (36.9 C)  99.7 F (37.6 C) 98.1 F (36.7 C)  TempSrc: Oral  Oral Oral  SpO2: 99%     Weight:  117.8 kg    Height:       Neurology awake and alert ENT with mild pallor Cardiovascular with S1 and S2 present and regular  with no gallops or rubs No JVD Respiratory with no wheezing or rhonchi Abdomen with no distention No lower extremity edema   Data Reviewed:    Family Communication: no family at the bedside   Disposition: Status is: Inpatient Remains inpatient appropriate because: Pending home milrinone    Planned Discharge Destination: Home     Author: Elidia Toribio Furnace, MD 03/15/2024 2:29 PM  For on call review [email or web address].  "

## 2024-03-16 ENCOUNTER — Inpatient Hospital Stay (HOSPITAL_COMMUNITY)

## 2024-03-16 DIAGNOSIS — E66812 Obesity, class 2: Secondary | ICD-10-CM | POA: Diagnosis not present

## 2024-03-16 DIAGNOSIS — N179 Acute kidney failure, unspecified: Secondary | ICD-10-CM | POA: Diagnosis not present

## 2024-03-16 DIAGNOSIS — I2694 Multiple subsegmental pulmonary emboli without acute cor pulmonale: Secondary | ICD-10-CM | POA: Diagnosis not present

## 2024-03-16 DIAGNOSIS — I5023 Acute on chronic systolic (congestive) heart failure: Secondary | ICD-10-CM | POA: Diagnosis not present

## 2024-03-16 DIAGNOSIS — E785 Hyperlipidemia, unspecified: Secondary | ICD-10-CM | POA: Diagnosis not present

## 2024-03-16 DIAGNOSIS — I2510 Atherosclerotic heart disease of native coronary artery without angina pectoris: Secondary | ICD-10-CM | POA: Diagnosis not present

## 2024-03-16 DIAGNOSIS — I10 Essential (primary) hypertension: Secondary | ICD-10-CM | POA: Diagnosis not present

## 2024-03-16 DIAGNOSIS — Z9861 Coronary angioplasty status: Secondary | ICD-10-CM | POA: Diagnosis not present

## 2024-03-16 HISTORY — PX: IR TUNNELED CENTRAL VENOUS CATH PLC W IMG: IMG1939

## 2024-03-16 LAB — CBC
HCT: 51.7 % (ref 39.0–52.0)
Hemoglobin: 17.9 g/dL — ABNORMAL HIGH (ref 13.0–17.0)
MCH: 31.2 pg (ref 26.0–34.0)
MCHC: 34.6 g/dL (ref 30.0–36.0)
MCV: 90.2 fL (ref 80.0–100.0)
Platelets: 284 K/uL (ref 150–400)
RBC: 5.73 MIL/uL (ref 4.22–5.81)
RDW: 13.2 % (ref 11.5–15.5)
WBC: 9.7 K/uL (ref 4.0–10.5)
nRBC: 0 % (ref 0.0–0.2)

## 2024-03-16 LAB — COOXEMETRY PANEL
Carboxyhemoglobin: 1.4 % (ref 0.5–1.5)
Methemoglobin: <0.7 % (ref 0.0–1.5)
O2 Saturation: 63.6 %
Total hemoglobin: 18.1 g/dL — ABNORMAL HIGH (ref 12.0–16.0)

## 2024-03-16 LAB — BASIC METABOLIC PANEL WITH GFR
Anion gap: 11 (ref 5–15)
BUN: 24 mg/dL — ABNORMAL HIGH (ref 6–20)
CO2: 28 mmol/L (ref 22–32)
Calcium: 9 mg/dL (ref 8.9–10.3)
Chloride: 93 mmol/L — ABNORMAL LOW (ref 98–111)
Creatinine, Ser: 1.23 mg/dL (ref 0.61–1.24)
GFR, Estimated: >60 mL/min
Glucose, Bld: 141 mg/dL — ABNORMAL HIGH (ref 70–99)
Potassium: 4.2 mmol/L (ref 3.5–5.1)
Sodium: 132 mmol/L — ABNORMAL LOW (ref 135–145)

## 2024-03-16 MED ORDER — HEPARIN SODIUM (PORCINE) 1000 UNIT/ML IJ SOLN
INTRAMUSCULAR | Status: AC
  Filled 2024-03-16: qty 10

## 2024-03-16 MED ORDER — LIDOCAINE-EPINEPHRINE 1 %-1:100000 IJ SOLN
INTRAMUSCULAR | Status: AC
  Filled 2024-03-16: qty 1

## 2024-03-16 MED ORDER — LIDOCAINE-EPINEPHRINE 1 %-1:100000 IJ SOLN
20.0000 mL | Freq: Once | INTRAMUSCULAR | Status: AC
  Administered 2024-03-16: 10 mL via INTRADERMAL

## 2024-03-16 NOTE — Progress Notes (Signed)
 " Daily Progress Note   Date: 03/16/2024   Patient Name: Reginald Fernandez  DOB: [birth year]  MRN: [id number]  Age / Sex: 43 y.o., male  Attending Physician: Noralee Elidia Sieving,* Primary Care Physician: [name], MD Admit Date: 03/04/2024 Length of Stay: 10 days  Reason for Follow-up: Establishing goals of care  Past Medical History:  Diagnosis Date   CAD S/P percutaneous coronary angioplasty 11/20/2013   a. 09/2009: EKG with Inf STEMI - no obstructive CAD;  b. 11/2013 Inflat STEMI/PCI: LM nl, LAD 20p, D1 sm - diff dzs, D2 large - nl, LCX 95-99, OM1 100 (3.5x38 Promus Premier DES), RCA 95-82m, 80d (3.0x20 and 3.0x16 Promus Premier DES') - normal EF;  c. NSTEMI 5/18 - patent stents, otw minimal CAD.- EF by Echo 35-40%; d. NSTEMI 10/2018 - ? culprit - Small branch of  D2,Med Rx,.  EF by Echo ~25%   Cardiomyopathy, ischemic 07/2016   h/o Inf STEMI 11/2013 - EF was Normal; b) NSTEMI 07/2016 (NO Cultprit on Cath) - Echo EF 35-40% (Basal Ant-Lat HK, basal-mid Inferolateral & apical Akinesis); c) NSTEMI 10/2018 -felt to be occluded small branch of D2, Echo EF further decreased to 20-25%.    Essential hypertension    Hyperlipidemia with target LDL less than 100    Marijuana abuse    Migraine    Morbid obesity (HCC)    ST-segment elevation myocardial infarction (STEMI) of inferior wall (HCC) 11/20/2013    Assessment & Plan:   HPI/Patient Profile:   Reginald Fernandez is a 43 year old male with history of CAD, hypertension, hyperlipidemia, PE on Eliquis , difficulty with medication compliance due to financial constraints admitted for heart failure and being followed by advanced heart failure team with recommendation for evaluation for LVAD.     Palliative care consulted in consideration for LVAD workup.  SUMMARY OF RECOMMENDATIONS  Full code, full scope Discussed advance directives with patient he would like to appoint his daughter Reginald Fernandez as primary with his mother and father as secondary and  tertiary healthcare power of attorney, AD paper work left for the patient to fill out and to notify team of when they are ready to have it notarized  Symptom Management:  Primary team  Code Status: Full Code  Prognosis: Unable to determine  Discharge Planning: To Be Determined   Subjective:   Subjective: Chart Reviewed. Updates received. Patient Assessed. Created space and opportunity for patient  and family to explore thoughts and feelings regarding current medical situation.  Today's Discussion:  Met with patient at bedside with mother Reginald Fernandez present.  Discussed LVAD and transplant with patient had a good understanding that this is a very serious and intensive surgery that requires a lot of rehabilitation after surgery.  Also emphasized that the patient will need a lot of support emotionally as well as physically in order to care for himself during his recovery.  Patient shares that he has a lot of support from his mother and father as well as older daughter Reginald Fernandez.  He also has other support systems that he can rely on when he needs it including his friends.  He shares that he is determined to be there for his children as he has a 20 and 68 year old.  He wants to be there for them as long as possible and he is determined to do what ever it takes to be there for them.  He hopes that the LVAD will allow him to continue a more normal life as well as  preventing his other organs from shutting down. Patient shares that he currently lives with his parents and his 2 younger children due to his health.  He is there so that he can get the care that he needs so that he can be there for his children.  Discussed limits with the patient and inquired about quality of life.  Asked him if there are limitations or hard stops that would be unacceptable to him going forward after surgery.  He says that he knows he wants a value good quality of life while prolonging his life as long as possible.  But he shares  that he and his family have discussed a lot of things and they are aware of what his limits are.  Inquired about dialysis and patient shared that at this point he would not want dialysis to prolong his life but he cannot be on 100% sure and at this time.  He shares that he will continue to think about what limits he has to the point where the quality of life that he is living is no longer acceptable for him and his family.  But he reiterates that he will do almost anything in order to continue to be there for his younger children and to provide and support them.  Discussed advance directive and healthcare power of attorney with the patient.  He shares that he would like to have his daughter Reginald Fernandez as primary healthcare power of attorney.  He wants a secondary and tertiary healthcare power of attorney to be his mother and father.  Patient and her mother inquires about activity with the LifeVest that he will be discharged with.  Patient shares that his children play basketball, soccer, football and other sports and he actively participates in helping coach them.  He is concerned that he may not be able to play as actively as he has in the past if he wears his LifeVest.  He also asks about skin breakdown with the LifeVest as he does perspire a lot and mother is concerned about blisters and skin breakdown due to his sweating.  Shared with him at that I would inquire with the heart failure team in order to answer their questions about physical activity and skin breakdown.  Review of Systems  All other systems reviewed and are negative.   Objective:   Primary Diagnoses: Present on Admission:  Essential hypertension  Hyperlipidemia LDL goal <70  Acute on chronic systolic CHF (congestive heart failure) (HCC)  Obesity, class 2  Pulmonary emboli (HCC)   Vital Signs:  BP 117/78 (BP Location: Left Arm)   Pulse 88   Temp 98.6 F (37 C) (Oral)   Resp 20   Ht 6' 2 (1.88 m)   Wt 118.5 kg   SpO2 98%    BMI 33.54 kg/m   Physical Exam HENT:     Head: Normocephalic and atraumatic.  Eyes:     Extraocular Movements: Extraocular movements intact.  Cardiovascular:     Rate and Rhythm: Normal rate.  Musculoskeletal:     Right lower leg: No edema.     Left lower leg: No edema.  Skin:    General: Skin is warm and dry.  Neurological:     General: No focal deficit present.     Mental Status: He is alert.  Psychiatric:        Mood and Affect: Mood normal.    Palliative Assessment/Data: 80%   Existing Vynca/ACP Documentation: None  Thank you for allowing  us  to participate in the care of Reginald Fernandez PMT will continue to support holistically.  I personally spent a total of 50 minutes in the care of the patient today including preparing to see the patient, performing a medically appropriate exam/evaluation, counseling and educating, and documenting clinical information in the EHR.   Fairy FORBES Shan DEVONNA  Palliative Medicine Team  Team Phone # [phone] (Nights/Weekends) 03/16/2024 4:57 PM  "

## 2024-03-16 NOTE — Progress Notes (Addendum)
 Patient ID: Reginald Fernandez, male   DOB: 20-Nov-1980, 43 y.o.   MRN: [id number]   Advanced Heart Failure Rounding Note  Chief Complaint: Low output HF Significant Events:    12/23: RHC w RA 6, PA 36/22 (29), PCW 12, TD CO/CI 4.3/1.7, PVR 3.5 WU, PAPi 2.3  Subjective:    Co-ox 64% on milrinone  0.125. CVP <5. Feels fine today.   LifeVest at bedside  Objective:    Vital Signs:   Temp:  [97.7 F (36.5 C)-99.7 F (37.6 C)] 97.7 F (36.5 C) (12/29 0402) Pulse Rate:  [65-91] 74 (12/29 0402) Resp:  [17-18] 18 (12/29 0402) BP: (101-132)/(64-99) 104/72 (12/29 0402) SpO2:  [100 %] 100 % (12/29 0402) Weight:  [118.5 kg] 118.5 kg (12/29 0456) Last BM Date : 03/15/24  Weight change: Filed Weights   03/14/24 0356 03/15/24 0415 03/16/24 0456  Weight: 118.3 kg 117.8 kg 118.5 kg   Intake/Output:  Intake/Output Summary (Last 24 hours) at 03/16/2024 9287 Last data filed at 03/15/2024 2044 Gross per 24 hour  Intake 600 ml  Output 1935 ml  Net -1335 ml    Physical Exam: General:  well appearing.  No respiratory difficulty Neck: JVD ~5 cm.  Cor: Regular rate & rhythm. No murmurs. Lungs: clear Extremities: no edema. PICC RUE Neuro: alert & oriented x 3. Affect pleasant.   Telemetry:SR 80s (personally reviewed)  Labs: Basic Metabolic Panel: Recent Labs  Lab 03/11/24 0453 03/11/24 0500 03/12/24 0603 03/13/24 0500 03/14/24 0508 03/15/24 0538 03/16/24 0335  NA  --    < > 132* 131* 129* 132* 132*  K  --    < > 4.7 4.6 4.4 4.5 4.2  CL  --    < > 94* 94* 92* 94* 93*  CO2  --    < > 25 26 26 28 28   GLUCOSE  --    < > 113* 114* 141* 138* 141*  BUN  --    < > 22* 24* 26* 27* 24*  CREATININE  --    < > 1.21 1.19 1.19 1.31* 1.23  CALCIUM   --    < > 9.0 9.1 8.8* 9.2 9.0  MG 2.5*  --  2.4 2.6* 2.3 2.5*  --    < > = values in this interval not displayed.    Liver Function Tests: No results for input(s): AST, ALT, ALKPHOS, BILITOT, PROT, ALBUMIN in the last 168  hours.  No results for input(s): LIPASE, AMYLASE in the last 168 hours.  No results for input(s): AMMONIA in the last 168 hours.  CBC: Recent Labs  Lab 03/10/24 0530 03/10/24 1018 03/11/24 0453 03/16/24 0335  WBC 8.8  --  9.7 9.7  HGB 17.1* 18.0*  18.0* 16.9 17.9*  HCT 50.5 53.0*  53.0* 50.3 51.7  MCV 91.7  --  92.6 90.2  PLT 301  --  288 284   BNP (last 3 results) Recent Labs    08/03/23 1028  BNP 892.8*   ProBNP (last 3 results) Recent Labs    03/04/24 1833  PROBNP 3,698.0*   Medications:    Scheduled Medications:  amiodarone   400 mg Oral BID   apixaban   5 mg Oral BID   atorvastatin   80 mg Oral q1800   Chlorhexidine  Gluconate Cloth  6 each Topical Daily   dapagliflozin  propanediol  10 mg Oral Daily   digoxin   0.0625 mg Oral Daily   ezetimibe   10 mg Oral Daily   guaiFENesin   600 mg Oral BID  mexiletine  300 mg Oral BID   pantoprazole   40 mg Oral Daily   sacubitril -valsartan   1 tablet Oral BID   sodium chloride  flush  10-40 mL Intracatheter Q12H   sodium chloride  flush  3 mL Intravenous Q12H   spironolactone   25 mg Oral Daily    Infusions:  milrinone  0.125 mcg/kg/min (03/15/24 1938)    PRN Medications: acetaminophen , methocarbamol , naLOXone  (NARCAN )  injection, nitroGLYCERIN , ondansetron  (ZOFRAN ) IV, polyethylene glycol, senna-docusate, sodium chloride  flush, sodium chloride  flush, traMADol   Assessment/Plan:   A/C Biventricular HFrEF, ICM +/- NICM component.  - LV/RV severely reduced for the last couple of years. Followed in the HF clinic with previous discussions about advanced therapies. We are still concerned he may need advanced therapies. Suspect abdominal issues are related to low output HF.  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 - RHC 12/23 with normal filling pressures with severely low cardiac output and PH - ECHO: EF < 20% with RV severely decreased systolic function.  - Co-ox lower early am at 64% on milrinone  0.125.  stable - CVP  <5, will hold torsemide  for now, probably restart lower dose 20 mg daily at discharge.  - Continue digoxin  0.0625 mg daily  - Continue entresto  24-26 mg bid, will not increase with SBP 90s at times.  - Continue spiro 25 mg daily   - Continue farxiga  10 mg daily - Failed Bidil  with severe HA - Blood type B+. W/u for advanced therapies. Will refer to [facility] as outpatient for transplant.  - Plan in place to get home milrinone  and LifeVest. LifeVest approved. SW awaiting approval for home milrinone . I will arrange for tunneled PICC today and can go home after that.   2. NSVT/PVCs   - High PVC burden initially, esp while on milrinone  - Continue amio 400 mg bid (taper to 200 daily as outpatient) + mexitil  300 mg bid - Keep K >4 and Mg >2. Stable.  - TSH ok - continue current therapy   Abdominal Pain - CT negative. Suspect this is related to low output heart failure.  - Resolved   CAD  - Strong family history of premature CAD.  H/o inferolateral STEMI in 9/15 with DES to LCx into OM1 and DES to RCA.  LHC 7/23 demonstrated diffuse CAD but no severe stenosis to explain worsening of his EF.   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 No bleeding   Awaiting authorization from insurance in regards to home Milrinone . Pam following.   Length of Stay: [address], AGACNP-BC  03/16/2024, 7:12 AM  Advanced Heart Failure Team Pager [phone] (M-F; 7a - 4p)  Please contact [facility] for night-coverage after hours (4p -7a ) and weekends on [email or web address]   Patient seen with NP, I formulated the plan and agree with the above note.   Co-ox 64%, CVP < 5. Feels good this morning.   General: NAD Neck: No JVD, no thyromegaly or thyroid nodule.  Lungs: Clear to auscultation bilaterally with normal respiratory effort. CV: Nondisplaced PMI.  Heart regular S1/S2, no S3/S4, no murmur.  No peripheral edema.   Abdomen: Soft, nontender, no hepatosplenomegaly, no  distention.  Skin: Intact without lesions or rashes.  Neurologic: Alert and oriented x 3.  Psych: Normal affect. Extremities: No clubbing or cyanosis.  HEENT: Normal.   He appears stable on milrinone  0.125 this morning with co-ox 64%.  Volume status looks good.  Creatinine stable at 1.23.  He can restart torsemide  20 mg daily at discharge.  He will need tunneled PICC placed today for home milrinone .  Will need referral to [facility] for transplant evaluation for end stage cardiomyopathy.  He will wear Lifevest.   [name] 03/16/2024 8:58 AM

## 2024-03-16 NOTE — TOC Progression Note (Addendum)
 Transition of Care [facility]) - Progression Note    Patient Details  Name: Reginald Fernandez MRN: [id number] Date of Birth: [birth year]  Transition of Care [facility]) CM/SW Contact  Justina Delcia Czar, RN Phone Number: [phone] 03/16/2024, 1:19 PM  Clinical Narrative:    HF CSW/CM spoke to pt at bedside. His new insurance [employer] does not start until 04/19/2024. Will need Home Milrinone . Updated by [employer] rep, Pam stating insurance is delayed due to holidays. [employer] insurance plan ends 03/18/2024. Pt signed up under [employer], and has attempted to reach out to plan.    Message sent to Inpatient Care Management team for letter of guarantee for month of January until new plan starts.   Expected Discharge Plan: Home w Home Health Services Barriers to Discharge: Continued Medical Work up    Expected Discharge Plan and Services   Discharge Planning Services: CM Consult Post Acute Care Choice: Home Health Living arrangements for the past 2 months: Single Family Home                 DME Arranged: Life vest DME Agency: [employer] Date DME Agency Contacted: 03/12/24 Time DME Agency Contacted: 1007 Representative spoke with at DME Agency: [facility] Arranged: RN HH Agency: [employer] Date HH Agency Contacted: 03/10/24 Time HH Agency Contacted: 1100 Representative spoke with at [facility] Agency: Holley Herring   Social Drivers of Health (SDOH) Interventions SDOH Screenings   Food Insecurity: No Food Insecurity (03/07/2024)  Housing: Low Risk (03/07/2024)  Transportation Needs: No Transportation Needs (03/07/2024)  Utilities: Not At Risk (03/07/2024)  Depression (PHQ2-9): Low Risk (06/27/2022)  Social Connections: Unknown (03/07/2024)  Tobacco Use: Medium Risk (03/04/2024)    Readmission Risk Interventions     No data to display

## 2024-03-16 NOTE — TOC Progression Note (Signed)
 Transition of Care [facility]) - Progression Note    Patient Details  Name: Reginald Fernandez MRN: [id number] Date of Birth: 05-Aug-1980  Transition of Care [facility]) CM/SW Contact  [name] Phone Number: [phone] 03/16/2024, 11:50 AM  Clinical Narrative:   HF CSW/CM met with patient at bedside. CSW spoke with patient about his insurance. Patient stated that his insurance is expiring after 03/18/24. Patient stated that the insurance in his job will start 04/19/24. Patient will need support for home milrinone  medication. Patient is agreeable to the CSW speaking with the [facility] team to see if the patient will qualify for emergency Medicaid. CSW emailed the FT team and waiting for updates. HF NCM will follow up with patient about LOG and conversation wit Pam.   HF CSW/CM will continue to follow and monitor for dc readiness.     Expected Discharge Plan: Home w Home Health Services Barriers to Discharge: Continued Medical Work up               Expected Discharge Plan and Services   Discharge Planning Services: CM Consult Post Acute Care Choice: Home Health Living arrangements for the past 2 months: Single Family Home                 DME Arranged: Life vest DME Agency: [employer] Date DME Agency Contacted: 03/12/24 Time DME Agency Contacted: 1007 Representative spoke with at DME Agency: [facility] Arranged: RN HH Agency: [employer] Date HH Agency Contacted: 03/10/24 Time HH Agency Contacted: 1100 Representative spoke with at [facility] Agency: Holley Herring   Social Drivers of Health (SDOH) Interventions SDOH Screenings   Food Insecurity: No Food Insecurity (03/07/2024)  Housing: Low Risk (03/07/2024)  Transportation Needs: No Transportation Needs (03/07/2024)  Utilities: Not At Risk (03/07/2024)  Depression (PHQ2-9): Low Risk (06/27/2022)  Social Connections: Unknown (03/07/2024)  Tobacco Use: Medium Risk (03/04/2024)    Readmission Risk Interventions     No data to display

## 2024-03-16 NOTE — Progress Notes (Signed)
 " Progress Note   Patient: Reginald Fernandez FMW:[id number] DOB: 02/20/1981 DOA: 03/04/2024     10 DOS: the patient was seen and examined on 03/16/2024   Brief hospital course: [name] was admitted to the hospital with the working diagnosis of heart failure exacerbation.   Patient 43 year old gentleman history of CAD, hypertension, hyperlipidemia, morbid obesity, history of PE and medication nonadherence due to financial constraints presented to the ED with abdominal pain, constipation, and dyspnea.  Patient reported 6 days of worsening symptoms prior to admission, along with a 20 lbs weight gain since June 2025.  On his initial physical examination his blood pressure was 164/101, HR 82, RR 17 and 02 saturation 98% Lungs with no wheezing or rhonchi, heart with S1 and S2 present and regular with no gallops, abdomen protuberant, soft and non tender, no lower extremity edema.   Patient placed on IV furosemide  for diuresis.  12/19 noted low output state and placed on IV milrinone  for inotropic support.    12/23 cardiac catheterization, normal LV filling pressure, but persistent low output failure due to biventricular dysfunction.  12/25 pending arrangement for outpatient home inotropic support, possible referral for heart transplant at tertiary care.  12/26 lifeVest has been approved, pending home milrinone   12/27 pending approval of  home inotropic support  12/28 pending home milrinone   12/29 chest tunneled PICC in place per IR   Assessment and Plan: * Acute on chronic systolic CHF (congestive heart failure) (HCC) Echocardiogram with reduced LV systolic function with EF < 20 %, global hypokinesis, moderate dilatated cavity, grade II diastolic dysfunction with pseudo normalization EA, severe reduction of RV systolic function, LA with severe dilation, RA with mild dilatation, no significant valvular disease.   Urine output is 1935 ml Systolic blood pressure 120 mmHg.  SV02 63.9  Plan to  continue medical therapy with SGLT 2 inh, digoxin , bidil , entresto , and sprionolactone Inotropic support with milrinone  0.125 mcg/kg/min  Loop diuretic with torsemide  40 mg po daily.   NSVT/ PVC Continue with amiodarone  and mexiletine   Plan for  home inotropic support and follow up as outpatient for possible heart transplant at tertiary care center.  lifeVest has been approved   CAD S/P percutaneous coronary angioplasty: DES PCI -Cx-OM1, RCA x 2 Elevation in high sensitive troponin due to heart failure decompensation  No chest pain, no acute coronary syndrome Continue as needed nitroglycerin    Essential hypertension Continue blood pressure control with bidil , entresto , spironolactone    Pulmonary emboli (HCC) Continue anticoagulation with apixaban   AKI (acute kidney injury) Hyponatremia,   Renal function today with serum cr at 1,23 with K at 4,2 and serum bicarbonate at 28  Na 132   Continue diuresis and follow up renal function and electrolytes in am.    Hyperlipidemia LDL goal <70 Continue atorvastatin  and ezetimibe    Obesity, class 2 Calculated BMI 35.3      Subjective: Patient with no chest pain and no dyspnea, no headaches, no PND, orthopnea or lower extremity edema   Physical Exam: Vitals:   03/16/24 0328 03/16/24 0402 03/16/24 0456 03/16/24 0733  BP: (!) 123/95 104/72  (!) 126/93  Pulse:  74  79  Resp: 17 18  18   Temp: 97.7 F (36.5 C) 97.7 F (36.5 C)  97.9 F (36.6 C)  TempSrc: Oral Oral  Oral  SpO2: 100% 100%  100%  Weight:   118.5 kg   Height:       Neurology awake and alert ENT with mild pallor  Cardiovascular with S1 and S2 present and regular with no gallops rubs or murmurs Respiratory with no rales or wheezing, no rhonchi Abdomen with no distention No lower extremity edema   Data Reviewed:    Family Communication: no family at the bedside   Disposition: Status is: Inpatient Remains inpatient appropriate because: pending home milrinone     Planned Discharge Destination: Home    Author: Elidia Toribio Furnace, MD 03/16/2024 3:43 PM  For on call review [email or web address].  "

## 2024-03-16 NOTE — Plan of Care (Signed)
°  Problem: Education: Goal: Ability to demonstrate management of disease process will improve Outcome: Progressing   Problem: Education: Goal: Ability to verbalize understanding of medication therapies will improve Outcome: Progressing   Problem: Cardiac: Goal: Ability to achieve and maintain adequate cardiopulmonary perfusion will improve Outcome: Progressing   Problem: Education: Goal: Knowledge of General Education information will improve Description: Including pain rating scale, medication(s)/side effects and non-pharmacologic comfort measures Outcome: Progressing

## 2024-03-16 NOTE — Plan of Care (Signed)
" °  Problem: Education: Goal: Ability to demonstrate management of disease process will improve Outcome: Progressing Goal: Ability to verbalize understanding of medication therapies will improve Outcome: Progressing   Problem: Activity: Goal: Capacity to carry out activities will improve Outcome: Progressing   Problem: Cardiac: Goal: Ability to achieve and maintain adequate cardiopulmonary perfusion will improve Outcome: Progressing   Problem: Education: Goal: Ability to demonstrate management of disease process will improve Outcome: Progressing Goal: Ability to verbalize understanding of medication therapies will improve Outcome: Progressing   Problem: Cardiac: Goal: Ability to achieve and maintain adequate cardiopulmonary perfusion will improve Outcome: Progressing   "
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
## 2024-03-16 NOTE — Procedures (Signed)
 Interventional Radiology Procedure Note  Procedure: Chest tunneled PICC  Complications: None  Estimated Blood Loss: < 10 mL  Findings: right jugular vein access for placement of a SL PICC.  Catheter tip at cavoatrial junction.  Cordella DELENA Banner, MD

## 2024-03-17 ENCOUNTER — Telehealth (HOSPITAL_BASED_OUTPATIENT_CLINIC_OR_DEPARTMENT_OTHER): Payer: Self-pay | Admitting: Licensed Clinical Social Worker

## 2024-03-17 DIAGNOSIS — I2694 Multiple subsegmental pulmonary emboli without acute cor pulmonale: Secondary | ICD-10-CM | POA: Diagnosis not present

## 2024-03-17 DIAGNOSIS — I2510 Atherosclerotic heart disease of native coronary artery without angina pectoris: Secondary | ICD-10-CM | POA: Diagnosis not present

## 2024-03-17 DIAGNOSIS — I10 Essential (primary) hypertension: Secondary | ICD-10-CM | POA: Diagnosis not present

## 2024-03-17 DIAGNOSIS — I5023 Acute on chronic systolic (congestive) heart failure: Secondary | ICD-10-CM | POA: Diagnosis not present

## 2024-03-17 LAB — CBC
HCT: 50.2 % (ref 39.0–52.0)
Hemoglobin: 17.1 g/dL — ABNORMAL HIGH (ref 13.0–17.0)
MCH: 31 pg (ref 26.0–34.0)
MCHC: 34.1 g/dL (ref 30.0–36.0)
MCV: 90.9 fL (ref 80.0–100.0)
Platelets: 255 K/uL (ref 150–400)
RBC: 5.52 MIL/uL (ref 4.22–5.81)
RDW: 13 % (ref 11.5–15.5)
WBC: 10.1 K/uL (ref 4.0–10.5)
nRBC: 0 % (ref 0.0–0.2)

## 2024-03-17 LAB — COOXEMETRY PANEL
Carboxyhemoglobin: 1.1 % (ref 0.5–1.5)
Methemoglobin: <0.7 % (ref 0.0–1.5)
O2 Saturation: 59.5 %
Total hemoglobin: 17.3 g/dL — ABNORMAL HIGH (ref 12.0–16.0)

## 2024-03-17 LAB — BASIC METABOLIC PANEL WITH GFR
Anion gap: 9 (ref 5–15)
BUN: 20 mg/dL (ref 6–20)
CO2: 27 mmol/L (ref 22–32)
Calcium: 9.2 mg/dL (ref 8.9–10.3)
Chloride: 95 mmol/L — ABNORMAL LOW (ref 98–111)
Creatinine, Ser: 1.04 mg/dL (ref 0.61–1.24)
GFR, Estimated: >60 mL/min
Glucose, Bld: 150 mg/dL — ABNORMAL HIGH (ref 70–99)
Potassium: 4.5 mmol/L (ref 3.5–5.1)
Sodium: 131 mmol/L — ABNORMAL LOW (ref 135–145)

## 2024-03-17 LAB — GLUCOSE, CAPILLARY: Glucose-Capillary: 126 mg/dL — ABNORMAL HIGH (ref 70–99)

## 2024-03-17 LAB — NICOTINE SCREEN, URINE: Cotinine Ql Scrn, Ur: POSITIVE ng/mL — AB

## 2024-03-17 MED ORDER — ORAL CARE MOUTH RINSE: 15.0000 mL | OROMUCOSAL | Status: DC | PRN

## 2024-03-17 MED ORDER — OXYCODONE HCL 5 MG PO TABS
5.0000 mg | ORAL_TABLET | Freq: Once | ORAL | Status: AC
  Administered 2024-03-17: 5 mg via ORAL
  Filled 2024-03-17: qty 1

## 2024-03-17 MED ORDER — SACUBITRIL-VALSARTAN 49-51 MG PO TABS
1.0000 | ORAL_TABLET | Freq: Two times a day (BID) | ORAL | Status: DC
  Administered 2024-03-17 – 2024-03-19 (×5): 1 via ORAL
  Filled 2024-03-17 (×5): qty 1

## 2024-03-17 NOTE — Telephone Encounter (Signed)
 H&V Care Navigation CSW Progress Note  Clinical Social Worker was contacted by palliative care team to inquire about LifeVest and ability to be active with that. Deferred questions to HF team, cc'd in [place], NP who recently saw pt. LCSW team (Jenna Uris and myself) remain available for outpatient needs at discharge.   Patient is participating in a Managed Medicaid Plan:  No, [employer]  SDOH Screenings   Food Insecurity: No Food Insecurity (03/07/2024)  Housing: Low Risk (03/07/2024)  Transportation Needs: No Transportation Needs (03/07/2024)  Utilities: Not At Risk (03/07/2024)  Depression (PHQ2-9): Low Risk (06/27/2022)  Social Connections: Unknown (03/07/2024)  Tobacco Use: Medium Risk (03/04/2024)    Reginald Fernandez, MSW, LCSW Clinical Social Worker II [facility] Heart/Vascular Care Navigation  [phone]- work cell phone (preferred)

## 2024-03-17 NOTE — Progress Notes (Addendum)
 Patient ID: Reginald Fernandez, male   DOB: 11-18-1980, 43 y.o.   MRN: [id number]   Advanced Heart Failure Rounding Note  Chief Complaint: Low output HF Significant Events:    12/23: RHC w RA 6, PA 36/22 (29), PCW 12, TD CO/CI 4.3/1.7, PVR 3.5 WU, PAPi 2.3  Subjective:    Co-ox 60% on milrinone  0.125. CVP <5. Weight inaccurate, unsure if they weighted him standing this morning. Will discuss with RN.   LifeVest at bedside  Feels fine this morning. Felt a little queasy this morning, now resolved. Denies CP/SOB.   Objective:    Vital Signs:   Temp:  [97.5 F (36.4 C)-98.6 F (37 C)] 97.5 F (36.4 C) (12/30 0425) Pulse Rate:  [60-88] 73 (12/30 0425) Resp:  [18-20] 18 (12/30 0425) BP: (117-126)/(78-93) 118/79 (12/30 0425) SpO2:  [98 %-100 %] 100 % (12/30 0031) Weight:  [123.4 kg] 123.4 kg (12/30 0425) Last BM Date : 03/15/24  Weight change: Filed Weights   03/15/24 0415 03/16/24 0456 03/17/24 0425  Weight: 117.8 kg 118.5 kg 123.4 kg   Intake/Output:  Intake/Output Summary (Last 24 hours) at 03/17/2024 0719 Last data filed at 03/17/2024 0530 Gross per 24 hour  Intake 1197 ml  Output 1700 ml  Net -503 ml    Physical Exam: General:  well appearing.  No respiratory difficulty Neck: JVD flat.  Cor: Regular rate & rhythm. No murmurs. +tunneled PICC RU chest Lungs: clear Extremities: no edema  Neuro: alert & oriented x 3. Affect pleasant.   Telemetry: SR 90s 0-3 PVCs (personally reviewed)  Labs: Basic Metabolic Panel: Recent Labs  Lab 03/11/24 0453 03/11/24 0500 03/12/24 0603 03/13/24 0500 03/14/24 0508 03/15/24 0538 03/16/24 0335 03/17/24 0615  NA  --    < > 132* 131* 129* 132* 132* 131*  K  --    < > 4.7 4.6 4.4 4.5 4.2 4.5  CL  --    < > 94* 94* 92* 94* 93* 95*  CO2  --    < > 25 26 26 28 28 27   GLUCOSE  --    < > 113* 114* 141* 138* 141* 150*  BUN  --    < > 22* 24* 26* 27* 24* 20  CREATININE  --    < > 1.21 1.19 1.19 1.31* 1.23 1.04  CALCIUM   --    < >  9.0 9.1 8.8* 9.2 9.0 9.2  MG 2.5*  --  2.4 2.6* 2.3 2.5*  --   --    < > = values in this interval not displayed.    Liver Function Tests: No results for input(s): AST, ALT, ALKPHOS, BILITOT, PROT, ALBUMIN in the last 168 hours.  No results for input(s): LIPASE, AMYLASE in the last 168 hours.  No results for input(s): AMMONIA in the last 168 hours.  CBC: Recent Labs  Lab 03/10/24 1018 03/11/24 0453 03/16/24 0335 03/17/24 0615  WBC  --  9.7 9.7 10.1  HGB 18.0*  18.0* 16.9 17.9* 17.1*  HCT 53.0*  53.0* 50.3 51.7 50.2  MCV  --  92.6 90.2 90.9  PLT  --  288 284 255   BNP (last 3 results) Recent Labs    08/03/23 1028  BNP 892.8*   ProBNP (last 3 results) Recent Labs    03/04/24 1833  PROBNP 3,698.0*   Medications:    Scheduled Medications:  amiodarone   400 mg Oral BID   apixaban   5 mg Oral BID   atorvastatin   80  mg Oral q1800   Chlorhexidine  Gluconate Cloth  6 each Topical Daily   dapagliflozin  propanediol  10 mg Oral Daily   digoxin   0.0625 mg Oral Daily   ezetimibe   10 mg Oral Daily   guaiFENesin   600 mg Oral BID   mexiletine  300 mg Oral BID   pantoprazole   40 mg Oral Daily   sacubitril -valsartan   1 tablet Oral BID   sodium chloride  flush  10-40 mL Intracatheter Q12H   sodium chloride  flush  3 mL Intravenous Q12H   spironolactone   25 mg Oral Daily    Infusions:  milrinone  0.125 mcg/kg/min (03/16/24 2204)    PRN Medications: acetaminophen , methocarbamol , naLOXone  (NARCAN )  injection, nitroGLYCERIN , ondansetron  (ZOFRAN ) IV, polyethylene glycol, senna-docusate, sodium chloride  flush, sodium chloride  flush, traMADol   Assessment/Plan:   A/C Biventricular HFrEF, ICM +/- NICM component.  - LV/RV severely reduced for the last couple of years. Followed in the HF clinic with previous discussions about advanced therapies. We are still concerned he may need advanced therapies. Suspect abdominal issues are related to low output HF.  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 - RHC 12/23 with normal filling pressures with severely low cardiac output and PH - ECHO: EF < 20% with RV severely decreased systolic function.  - Co-ox 60% on milrinone  0.125.  stable - CVP <5, continue to hold torsemide  for now, restart lower dose 20 mg daily at discharge.  - Continue digoxin  0.0625 mg daily  - Continue entresto  24-26 mg bid, will not increase with SBP 90s at times.  - Continue spiro 25 mg daily   - Continue farxiga  10 mg daily - Failed Bidil  with severe HA - Blood type B+. W/u for advanced therapies. Will refer to [facility] as outpatient for transplant once insurance kicks in.  - Plan in place to get home milrinone  and LifeVest. LifeVest approved. SW awaiting approval for home milrinone . Now with tunneled PICC.   2. NSVT/PVCs   - High PVC burden initially, esp while on milrinone  - Continue amio 400 mg bid (taper to 200 daily as outpatient) + mexitil  300 mg bid - Keep K >4 and Mg >2. Stable.  - TSH ok - continue current therapy   Abdominal Pain - CT negative. Suspect this is related to low output heart failure.  - Resolved   CAD  - Strong family history of premature CAD.  H/o inferolateral STEMI in 9/15 with DES to LCx into OM1 and DES to RCA.  LHC 7/23 demonstrated diffuse CAD but no severe stenosis to explain worsening of his EF.   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 No bleeding   Awaiting authorization from insurance in regards to home Milrinone . Pam following. Plan for LOG through January as his new insurance kicks in 04/20/23.   Length of Stay: [address] AGACNP-BC  03/17/2024, 7:19 AM  Advanced Heart Failure Team Pager [phone] (M-F; 7a - 4p)  Please contact [facility] for night-coverage after hours (4p -7a ) and weekends on [email or web address]   Patient seen with NP, I formulated the plan and agree with the above note.   Co-ox 60%, CVP < 5 still this morning.  SBP in 120s. He is on  milrinone  0.125.   General: NAD Neck: No JVD, no thyromegaly or thyroid nodule.  Lungs: Clear to auscultation bilaterally with normal respiratory effort. CV: Nondisplaced PMI.  Heart regular S1/S2, no S3/S4, no murmur.  No peripheral  edema.    Abdomen: Soft, nontender, no hepatosplenomegaly, no distention.  Skin: Intact without lesions or rashes.  Neurologic: Alert and oriented x 3.  Psych: Normal affect. Extremities: No clubbing or cyanosis.  HEENT: Normal.   He appears stable on milrinone  0.125 this morning with co-ox 60%.  Volume status looks good.  Creatinine stable at 1.04. Increase Entresto  to 49/51 bid.  He can restart torsemide  20 mg daily at discharge.   He will go home on milrinone  as hopeful bridge to transplant, can go home when letter of guaranty for the milrinone  is ready (need coverage for the next month until he gets insurance).  Will need transplant evaluation at [facility] for end stage cardiomyopathy, will make referral 04/19/24 when his insurance start.  He will wear Lifevest.  Needs to stop vaping.   [name] 03/17/2024 8:23 AM

## 2024-03-17 NOTE — Progress Notes (Signed)
 " Progress Note   Patient: Reginald Fernandez FMW:[id number] DOB: 1980-03-30 DOA: 03/04/2024     11 DOS: the patient was seen and examined on 03/17/2024   Brief hospital course: Reginald Fernandez was admitted to the hospital with the working diagnosis of heart failure exacerbation.   Patient 43 year old gentleman history of CAD, hypertension, hyperlipidemia, morbid obesity, history of PE and medication nonadherence due to financial constraints presented to the ED with abdominal pain, constipation, and dyspnea.  Patient reported 6 days of worsening symptoms prior to admission, along with a 20 lbs weight gain since June 2025.  On his initial physical examination his blood pressure was 164/101, HR 82, RR 17 and 02 saturation 98% Lungs with no wheezing or rhonchi, heart with S1 and S2 present and regular with no gallops, abdomen protuberant, soft and non tender, no lower extremity edema.   Patient placed on IV furosemide  for diuresis.  12/19 noted low output state and placed on IV milrinone  for inotropic support.    12/23 cardiac catheterization, normal LV filling pressure, but persistent low output failure due to biventricular dysfunction.  12/25 pending arrangement for outpatient home inotropic support, possible referral for heart transplant at tertiary care.  12/26 lifeVest has been approved, pending home milrinone   12/27 pending approval of  home inotropic support  12/28 pending home milrinone   12/29 chest tunneled PICC in place per IR  12/30 pending insurance approval for home milrinone    Assessment and Plan: * Acute on chronic systolic CHF (congestive heart failure) (HCC) Echocardiogram with reduced LV systolic function with EF < 20 %, global hypokinesis, moderate dilatated cavity, grade II diastolic dysfunction with pseudo normalization EA, severe reduction of RV systolic function, LA with severe dilation, RA with mild dilatation, no significant valvular disease.   Urine output is 1700 ml Systolic  blood pressure 120 mmHg.  SV02 59.5  Plan to continue medical therapy with SGLT 2 inh, digoxin , bidil , entresto , and sprionolactone Inotropic support with milrinone  0.125 mcg/kg/min  Loop diuretic with torsemide  40 mg po daily.   NSVT/ PVC Continue with amiodarone  and mexiletine   Plan for  home inotropic support and follow up as outpatient for possible heart transplant at tertiary care center.  lifeVest has been approved   CAD S/P percutaneous coronary angioplasty: DES PCI -Cx-OM1, RCA x 2 Elevation in high sensitive troponin due to heart failure decompensation  No chest pain, no acute coronary syndrome Continue as needed nitroglycerin    Essential hypertension Continue blood pressure control with bidil , entresto , spironolactone    Pulmonary emboli (HCC) Continue anticoagulation with apixaban   AKI (acute kidney injury) Hyponatremia,   Stable renal function with serum cr at 1,0 with K at 4,5 and serum bicarbonate at 27  Na 131   Continue diuresis and follow up renal function and electrolytes in am.    Hyperlipidemia LDL goal <70 Continue atorvastatin  and ezetimibe    Obesity, class 2 Calculated BMI 35.3        Subjective: Patient with no chest pain and no dyspnea, he has been ambulating in the hallway   Physical Exam: Vitals:   03/16/24 1953 03/17/24 0031 03/17/24 0425 03/17/24 0804  BP: 125/88 120/88 118/79 127/76  Pulse: 60 78 73 74  Resp: 19 18 18 18   Temp: 97.7 F (36.5 C) 97.8 F (36.6 C) (!) 97.5 F (36.4 C) 98.2 F (36.8 C)  TempSrc: Oral Oral Oral Oral  SpO2:  100%  100%  Weight:   123.4 kg   Height:  Neurology awake and alert ENT with no pallor Cardiovascular with S1 and S2 present and regular with no gallops, rubs or murmurs Respiratory with no rales or wheezing, no rhonchi Abdomen with no distention, soft and non tender No lower extremity edema  Data Reviewed:    Family Communication: no family at the bedside   Disposition: Status  is: Inpatient Remains inpatient appropriate because: home IV inotropics   Planned Discharge Destination: Home     Author: Elidia Toribio Furnace, MD 03/17/2024 4:50 PM  For on call review [email or web address].  "

## 2024-03-17 NOTE — Progress Notes (Signed)
 Patient with several questions regarding lifevest. Discussed with Rep, he will have a PSA (Patient Service Representative)  personally call patient to address questions.   Called patient to make him aware. He will be listening out for either his personal phone or room phone to ring.   Beckey LITTIE Coe AGACNP-BC  Advanced Heart Failure Team  03/17/2024

## 2024-03-17 NOTE — TOC Progression Note (Signed)
 Transition of Care [facility]) - Progression Note    Patient Details  Name: Reginald Fernandez MRN: [id number] Date of Birth: [birth year]  Transition of Care [facility] CM/SW Contact  Justina Delcia Czar, RN Phone Number: [phone] 03/17/2024, 5:06 PM  Clinical Narrative:     Marlo hunter [place] sent letter of guarantee ELIGAH) was sent to Inpatient Director, Zack. LOG is in review and AD will sign and send to [employer] for Home Milrinone  for approval. [employer] needs LOG on 03/18/2024 so their pharmacy can prepare for dc on 03/19/2024.   Expected Discharge Plan: Home w Home Health Services Barriers to Discharge: Continued Medical Work up    Expected Discharge Plan and Services   Discharge Planning Services: CM Consult Post Acute Care Choice: Home Health Living arrangements for the past 2 months: Single Family Home                 DME Arranged: Life vest DME Agency: [employer] Date DME Agency Contacted: 03/12/24 Time DME Agency Contacted: 1007 Representative spoke with at DME Agency: Alm Rutter [facility] Arranged: RN HH Agency: [employer] Date HH Agency Contacted: 03/10/24 Time HH Agency Contacted: 1100 Representative spoke with at [employer] Agency: Lango Herring   Social Drivers of Health (SDOH) Interventions SDOH Screenings   Food Insecurity: No Food Insecurity (03/07/2024)  Housing: Low Risk (03/07/2024)  Transportation Needs: No Transportation Needs (03/07/2024)  Utilities: Not At Risk (03/07/2024)  Depression (PHQ2-9): Low Risk (06/27/2022)  Social Connections: Unknown (03/07/2024)  Tobacco Use: Medium Risk (03/04/2024)    Readmission Risk Interventions     No data to display

## 2024-03-18 ENCOUNTER — Other Ambulatory Visit (HOSPITAL_COMMUNITY): Payer: Self-pay

## 2024-03-18 DIAGNOSIS — I5023 Acute on chronic systolic (congestive) heart failure: Secondary | ICD-10-CM | POA: Diagnosis not present

## 2024-03-18 LAB — CBC
HCT: 48.4 % (ref 39.0–52.0)
Hemoglobin: 16.4 g/dL (ref 13.0–17.0)
MCH: 30.9 pg (ref 26.0–34.0)
MCHC: 33.9 g/dL (ref 30.0–36.0)
MCV: 91.1 fL (ref 80.0–100.0)
Platelets: 231 K/uL (ref 150–400)
RBC: 5.31 MIL/uL (ref 4.22–5.81)
RDW: 13.1 % (ref 11.5–15.5)
WBC: 7.7 K/uL (ref 4.0–10.5)
nRBC: 0 % (ref 0.0–0.2)

## 2024-03-18 LAB — BASIC METABOLIC PANEL WITH GFR
Anion gap: 9 (ref 5–15)
BUN: 17 mg/dL (ref 6–20)
CO2: 26 mmol/L (ref 22–32)
Calcium: 8.9 mg/dL (ref 8.9–10.3)
Chloride: 97 mmol/L — ABNORMAL LOW (ref 98–111)
Creatinine, Ser: 1 mg/dL (ref 0.61–1.24)
GFR, Estimated: >60 mL/min
Glucose, Bld: 127 mg/dL — ABNORMAL HIGH (ref 70–99)
Potassium: 4.4 mmol/L (ref 3.5–5.1)
Sodium: 132 mmol/L — ABNORMAL LOW (ref 135–145)

## 2024-03-18 LAB — COOXEMETRY PANEL
Carboxyhemoglobin: 1.2 % (ref 0.5–1.5)
Carboxyhemoglobin: 1.4 % (ref 0.5–1.5)
Methemoglobin: <0.7 % (ref 0.0–1.5)
Methemoglobin: <0.7 % (ref 0.0–1.5)
O2 Saturation: 51.2 %
O2 Saturation: 60.1 %
Total hemoglobin: 16.8 g/dL — ABNORMAL HIGH (ref 12.0–16.0)
Total hemoglobin: 16.6 g/dL — ABNORMAL HIGH (ref 12.0–16.0)

## 2024-03-18 MED ORDER — TORSEMIDE 20 MG PO TABS
20.0000 mg | ORAL_TABLET | ORAL | 0 refills | Status: DC
  Filled 2024-03-18: qty 30, 60d supply, fill #0

## 2024-03-18 MED ORDER — DAPAGLIFLOZIN PROPANEDIOL 10 MG PO TABS
10.0000 mg | ORAL_TABLET | Freq: Every day | ORAL | 0 refills | Status: AC
  Filled 2024-03-18: qty 90, 90d supply, fill #0

## 2024-03-18 MED ORDER — APIXABAN 5 MG PO TABS
5.0000 mg | ORAL_TABLET | Freq: Two times a day (BID) | ORAL | 0 refills | Status: AC
  Filled 2024-03-18: qty 60, 30d supply, fill #0

## 2024-03-18 MED ORDER — EZETIMIBE 10 MG PO TABS
10.0000 mg | ORAL_TABLET | Freq: Every day | ORAL | 1 refills | Status: DC
  Filled 2024-03-18: qty 30, 30d supply, fill #0

## 2024-03-18 MED ORDER — SACUBITRIL-VALSARTAN 49-51 MG PO TABS
1.0000 | ORAL_TABLET | Freq: Two times a day (BID) | ORAL | 0 refills | Status: DC
  Filled 2024-03-18: qty 60, 30d supply, fill #0

## 2024-03-18 MED ORDER — DIGOXIN 125 MCG PO TABS
0.0625 mg | ORAL_TABLET | Freq: Every day | ORAL | 0 refills | Status: DC
  Filled 2024-03-18: qty 15, 30d supply, fill #0

## 2024-03-18 MED ORDER — AMIODARONE HCL 200 MG PO TABS
200.0000 mg | ORAL_TABLET | Freq: Every day | ORAL | 0 refills | Status: DC
  Filled 2024-03-18: qty 30, 30d supply, fill #0

## 2024-03-18 MED ORDER — MILRINONE LACTATE IN DEXTROSE 20-5 MG/100ML-% IV SOLN: 0.1250 ug/kg/min | INTRAVENOUS | Status: AC

## 2024-03-18 MED ORDER — MEXILETINE HCL 150 MG PO CAPS
300.0000 mg | ORAL_CAPSULE | Freq: Two times a day (BID) | ORAL | 0 refills | Status: DC
  Filled 2024-03-18: qty 120, 30d supply, fill #0

## 2024-03-18 MED ORDER — SACUBITRIL-VALSARTAN 24-26 MG PO TABS
1.0000 | ORAL_TABLET | Freq: Two times a day (BID) | ORAL | 0 refills | Status: DC
  Filled 2024-03-18: qty 60, 30d supply, fill #0

## 2024-03-18 MED ORDER — ATORVASTATIN CALCIUM 80 MG PO TABS
80.0000 mg | ORAL_TABLET | Freq: Every day | ORAL | 0 refills | Status: DC
  Filled 2024-03-18: qty 90, 90d supply, fill #0

## 2024-03-18 MED ORDER — SPIRONOLACTONE 25 MG PO TABS
25.0000 mg | ORAL_TABLET | Freq: Every day | ORAL | 0 refills | Status: DC
  Filled 2024-03-18: qty 30, 30d supply, fill #0

## 2024-03-18 NOTE — Progress Notes (Signed)
 Nutrition Follow-up  DOCUMENTATION CODES:  Not applicable  INTERVENTION:  Continue heart healthy diet as ordered Double protein portions with meals Snacks BID as desired Continue MVI with minerals daily Protein in different foods handout added to AVS  NUTRITION DIAGNOSIS:  Increased nutrient needs related to chronic illness as evidenced by estimated needs. 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 Presented with abdominal pain, constipation and SOB. Admitted for acute CHF exacerbation and started on IV Lasix . Cardiology consulted and now undergoing evaluation for LVAD placement.  Plan for  home inotropic support and follow up as outpatient for possible heart transplant at tertiary care center.  lifeVest has been approved    Spoke with patient at bedside. He is in good spirits. Reports having a good appetite and tolerating diet well. Inquiring about ways to optimize his protein intake once discharged. Discussed options via plant based and meat based. Handout added to AVS for home review.   Meal completions: 12/28: 100% breakfast, 0% lunch, 75% dinner 12/29: 0% breakfast, 95% lunch 12/30: 100% breakfast, 100% dinner 12/31: 100% breakfast  Admit weight: 126.1 kg Current weight: 119.5 kg  UOP: x24 hours I/O's: -18.4L since admit  Medications: milrinone   Labs:  Sodium 132 Chloride 97  Diet Order:   Diet Order             Diet Heart Room service appropriate? Yes; Fluid consistency: Thin; Fluid restriction: 2000 mL Fluid  Diet effective  now                   EDUCATION NEEDS:  Education needs have been addressed  Skin:  Skin Assessment: Reviewed RN Assessment  Last BM:  12/28  Height:  Ht Readings from Last 1 Encounters:  03/10/24 6' 2 (1.88 m)    Weight:  Wt Readings from Last 1 Encounters:  03/18/24 119.5 kg    Ideal Body Weight:  86.4 kg  BMI:  Body mass index is 33.82 kg/m.  Estimated Nutritional Needs:   Kcal:  2100-2300 kcals  Protein:  105-120g  Fluid:  >2L/day  Royce Maris, RDN, LDN Clinical Nutrition See AMiON for contact information.

## 2024-03-18 NOTE — Progress Notes (Addendum)
 Patient ID: JENNY OMDAHL, male   DOB: 1981-03-08, 43 y.o.   MRN: [id number]   Advanced Heart Failure Rounding Note  Chief Complaint: Low output HF Significant Events:    12/23: RHC w RA 6, PA 36/22 (29), PCW 12, TD CO/CI 4.3/1.7, PVR 3.5 WU, PAPi 2.3  Subjective:    Co-ox 51% on milrinone  0.125. CVP <5. Weight stable.   LifeVest at bedside  Feels better this morning. LifeVest team called him yesterday to answer all questions. Otherwise stable. Excited to hopefully get out of here on the 1st.   Objective:    Vital Signs:   Temp:  [97.4 F (36.3 C)-98.5 F (36.9 C)] 97.8 F (36.6 C) (12/31 0431) Pulse Rate:  [41-78] 41 (12/31 0431) Resp:  [18] 18 (12/31 0431) BP: (98-127)/(66-91) 124/87 (12/31 0431) SpO2:  [98 %-100 %] 100 % (12/31 0431) Weight:  [119.5 kg] 119.5 kg (12/31 0431) Last BM Date : 03/15/24  Weight change: Filed Weights   03/16/24 0456 03/17/24 0425 03/18/24 0431  Weight: 118.5 kg 123.4 kg 119.5 kg   Intake/Output:  Intake/Output Summary (Last 24 hours) at 03/18/2024 0728 Last data filed at 03/18/2024 0431 Gross per 24 hour  Intake 800 ml  Output 1550 ml  Net -750 ml    Physical Exam: General:  well appearing.  No respiratory difficulty Neck: JVD flat.  Cor: Regular rate & rhythm. No murmurs. +tunneled PICC RU chest Lungs: clear Extremities: no edema  Neuro: alert & oriented x 3. Affect pleasant.   Telemetry: SR 70s, intt NSVT/PVCs (personally reviewed)  Labs: Basic Metabolic Panel: Recent Labs  Lab 03/12/24 0603 03/13/24 0500 03/14/24 0508 03/15/24 0538 03/16/24 0335 03/17/24 0615 03/18/24 0539  NA 132* 131* 129* 132* 132* 131* 132*  K 4.7 4.6 4.4 4.5 4.2 4.5 4.4  CL 94* 94* 92* 94* 93* 95* 97*  CO2 25 26 26 28 28 27 26   GLUCOSE 113* 114* 141* 138* 141* 150* 127*  BUN 22* 24* 26* 27* 24* 20 17  CREATININE 1.21 1.19 1.19 1.31* 1.23 1.04 1.00  CALCIUM  9.0 9.1 8.8* 9.2 9.0 9.2 8.9  MG 2.4 2.6* 2.3 2.5*  --   --   --     Liver  Function Tests: No results for input(s): AST, ALT, ALKPHOS, BILITOT, PROT, ALBUMIN in the last 168 hours.  No results for input(s): LIPASE, AMYLASE in the last 168 hours.  No results for input(s): AMMONIA in the last 168 hours.  CBC: Recent Labs  Lab 03/16/24 0335 03/17/24 0615 03/18/24 0539  WBC 9.7 10.1 7.7  HGB 17.9* 17.1* 16.4  HCT 51.7 50.2 48.4  MCV 90.2 90.9 91.1  PLT 284 255 231   BNP (last 3 results) Recent Labs    08/03/23 1028  BNP 892.8*   ProBNP (last 3 results) Recent Labs    03/04/24 1833  PROBNP 3,698.0*   Medications:    Scheduled Medications:  amiodarone   400 mg Oral BID   apixaban   5 mg Oral BID   atorvastatin   80 mg Oral q1800   Chlorhexidine  Gluconate Cloth  6 each Topical Daily   dapagliflozin  propanediol  10 mg Oral Daily   digoxin   0.0625 mg Oral Daily   ezetimibe   10 mg Oral Daily   guaiFENesin   600 mg Oral BID   mexiletine  300 mg Oral BID   pantoprazole   40 mg Oral Daily   sacubitril -valsartan   1 tablet Oral BID   sodium chloride  flush  10-40 mL Intracatheter Q12H  sodium chloride  flush  3 mL Intravenous Q12H   spironolactone   25 mg Oral Daily    Infusions:  milrinone  0.125 mcg/kg/min (03/17/24 2043)    PRN Medications: acetaminophen , methocarbamol , naLOXone  (NARCAN )  injection, nitroGLYCERIN , ondansetron  (ZOFRAN ) IV, mouth rinse, polyethylene glycol, senna-docusate, sodium chloride  flush, sodium chloride  flush, traMADol   Assessment/Plan:   A/C Biventricular HFrEF, ICM +/- NICM component.  - LV/RV severely reduced for the last couple of years. Followed in the HF clinic with previous discussions about advanced therapies. We are still concerned he may need advanced therapies. Suspect abdominal issues are related to low output HF.  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 - RHC 12/23 with normal filling pressures with severely low cardiac output and PH - ECHO: EF < 20% with RV severely decreased systolic function.   - Co-ox 51% on milrinone  0.125.  Repeat - CVP <5, continue to hold torsemide  for now, restart lower dose 20 mg QOD at discharge.  - Continue digoxin  0.0625 mg daily  - Continue entresto  49-51 mg bid.  - Continue spiro 25 mg daily   - Continue farxiga  10 mg daily - Failed Bidil  with severe HA - Blood type B+. W/u for advanced therapies. Will refer to [facility] as outpatient for transplant once insurance kicks in (04/20/23).  - Plan in place to get home milrinone  and LifeVest. LifeVest approved and at bedside. SW awaiting approval for home milrinone , LOG sent 12/30. Now with tunneled PICC.   2. NSVT/PVCs   - High PVC burden initially, esp while on milrinone  - Continue amio 400 mg bid (taper to 200 daily as outpatient) + mexitil  300 mg bid - Keep K >4 and Mg >2. Stable.  - TSH ok - continue current therapy   Abdominal Pain - CT negative. Suspect this is related to low output heart failure.  - Resolved   CAD  - Strong family history of premature CAD.  H/o inferolateral STEMI in 9/15 with DES to LCx into OM1 and DES to RCA.  LHC 7/23 demonstrated diffuse CAD but no severe stenosis to explain worsening of his EF.   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 No bleeding   Awaiting authorization from insurance in regards to home Milrinone . Pam following. Plan for LOG through January as his new insurance kicks in 04/20/23.   Length of Stay: [address], AGACNP-BC  03/18/2024, 7:28 AM  Advanced Heart Failure Team Pager [phone] (M-F; 7a - 4p)  Please contact [facility] for night-coverage after hours (4p -7a ) and weekends on [email or web address]   Patient seen with NP, I formulated the plan and agree with the above note.   Stable today with CVP still < 5.  Repeat co-ox on milrinone  0.125 stable at 60%.  Creatinine stable 1.0.   Walking in hall, feels great.   General: NAD Neck: No JVD, no thyromegaly or thyroid nodule.  Lungs: Clear to auscultation  bilaterally with normal respiratory effort. CV: Nondisplaced PMI.  Heart regular S1/S2, no S3/S4, no murmur.  No peripheral edema.    Abdomen: Soft, nontender, no hepatosplenomegaly, no distention.  Skin: Intact without lesions or rashes.  Neurologic: Alert and oriented x 3.  Psych: Normal affect. Extremities: No clubbing or cyanosis.  HEENT: Normal.   He appears stable on milrinone  0.125 this morning with co-ox 60%.  Volume status looks good.  Creatinine stable at 1.0. CVP < 5, no torsemide  today.  Will restart torsemide  at  20 mg every other day at discharge.   Can decrease amiodarone  to 200 mg bid x 1 week at discharge then 200 mg daily after that.    He will go home on milrinone  as hopeful bridge to transplant, can go home when letter of guaranty for the milrinone  is ready, sounds like this will be January 1st (need coverage for the next month until he gets insurance).  Will need transplant evaluation at [facility] for end stage cardiomyopathy, will make referral 04/19/24 when his insurance start.  He will wear Lifevest.  Needs to stop vaping.   [name] 03/18/2024 12:52 PM

## 2024-03-18 NOTE — Plan of Care (Signed)
" °  Problem: Education: Goal: Ability to demonstrate management of disease process will improve Outcome: Progressing   Problem: Activity: Goal: Capacity to carry out activities will improve Outcome: Progressing   Problem: Cardiac: Goal: Ability to achieve and maintain adequate cardiopulmonary perfusion will improve Outcome: Progressing   Problem: Education: Goal: Knowledge of General Education information will improve Description: Including pain rating scale, medication(s)/side effects and non-pharmacologic comfort measures Outcome: Progressing   Problem: Clinical Measurements: Goal: Respiratory complications will improve Outcome: Progressing Goal: Cardiovascular complication will be avoided Outcome: Progressing   Problem: Activity: Goal: Risk for activity intolerance will decrease Outcome: Progressing   Problem: Nutrition: Goal: Adequate nutrition will be maintained Outcome: Progressing   Problem: Coping: Goal: Level of anxiety will decrease Outcome: Progressing   Problem: Pain Managment: Goal: General experience of comfort will improve and/or be controlled Outcome: Progressing   Problem: Safety: Goal: Ability to remain free from injury will improve Outcome: Progressing   Problem: Skin Integrity: Goal: Risk for impaired skin integrity will decrease Outcome: Progressing   Problem: Education: Goal: Ability to demonstrate management of disease process will improve Outcome: Progressing   Problem: Activity: Goal: Capacity to carry out activities will improve Outcome: Progressing   Problem: Cardiac: Goal: Ability to achieve and maintain adequate cardiopulmonary perfusion will improve Outcome: Progressing   "

## 2024-03-18 NOTE — Discharge Summary (Addendum)
 Physician Discharge Summary  Reginald Fernandez FMW:[id number] DOB: Jan 18, 1981 DOA: 03/04/2024  PCP: Reginald Barnie NOVAK, MD  Admit date: 03/04/2024 Discharge date: 03/18/2024  Time spent: 45 minutes  Recommendations for Outpatient Follow-up:  Advanced heart failure clinic on 03/27/24   Discharge Diagnoses:  Principal Problem:   Acute on chronic systolic CHF (congestive heart failure) (HCC) Low output CHF NSVT, PVCs   CAD S/P percutaneous coronary angioplasty: DES PCI -Cx-OM1, RCA x 2   Essential hypertension   Pulmonary emboli (HCC)   AKI (acute kidney injury)   Hyperlipidemia LDL goal <70   Obesity, class 2   Discharge Condition: Improved  Diet recommendation: Heart healthy  Filed Weights   03/16/24 0456 03/17/24 0425 03/18/24 0431  Weight: 118.5 kg 123.4 kg 119.5 kg    History of present illness:  43/M with history of CAD, PE, systolic CHF admitted with low output heart failure  Hospital Course:   Acute on chronic systolic CHF, BiV failure -Admitted with volume overload, concern for low output initially -Diuresed well with IV Lasix  and milrinone  -Followed by CHF team, transition to oral digoxin , Entresto , Aldactone  and Farxiga  -Plan for home milrinone  at discharge, plan to refer to [facility] as outpatient for transplant eval -Torsemide  changed to 20 mg every other day - Needs close follow-up in heart failure clinic   PVCs -High burden especially on milrinone , on amiodarone  400 mg twice daily now, plan for 200 mg as outpatient with mexiletine 300 mg twice daily   Abdominal Pain - Likely related to low output, resolved, CT was unremarkable  Acute kidney injury Resolved   CAD  - History of STEMI in 9/15, DES to left circumflex and DES to RCA  - Repeat LHC 7/23 with diffuse CAD  - Continue atorvastatin  and zetia    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  Discharge Exam: Vitals:   03/18/24 0431 03/18/24 0847  BP: 124/87 106/70  Pulse: (!) 41 75  Resp: 18 20  Temp: 97.8 F  (36.6 C) 97.9 F (36.6 C)  SpO2: 100% 100%    Gen: Awake, Alert, Oriented X 3,  HEENT: no JVD,  Lungs: Good air movement bilaterally, CTAB CVS: S1S2/RRR Abd: soft, Non tender, non distended, BS present Extremities: No edema Skin: no new rashes on exposed skin   Discharge Instructions    Allergies as of 03/18/2024       Reactions   Penicillins Other (See Comments)   Childhood allergy Unknown reaction Did it involve swelling of the face/tongue/throat, SOB, or low BP? No Did it involve sudden or severe rash/hives, skin peeling, or any reaction on the inside of your mouth or nose? No Did you need to seek medical attention at a hospital or doctor's office? No When did it last happen?      child If all above answers are NO, may proceed with cephalosporin use.        Medication List     STOP taking these medications    Entresto  24-26 MG Generic drug: sacubitril -valsartan  Replaced by: sacubitril -valsartan  49-51 MG       TAKE these medications    acetaminophen  325 MG tablet Commonly known as: TYLENOL  Take 2 tablets (650 mg total) by mouth every 6 (six) hours as needed for mild pain (or Fever >/= 101).   amiodarone  200 MG tablet Commonly known as: PACERONE  Take 1 tablet (200 mg total) by mouth daily. What changed: See the new instructions.   atorvastatin  80 MG tablet Commonly known as: LIPITOR  Take 1 tablet (80  mg total) by mouth daily at 6 PM   colchicine  0.6 MG tablet Take 1 tablet (0.6 mg total) by mouth daily for 7 days.   digoxin  0.125 MG tablet Commonly known as: LANOXIN  Take 0.5 tablets (0.0625 mg total) by mouth daily. What changed: how much to take   docusate sodium  100 MG capsule Commonly known as: COLACE Take 1 capsule (100 mg total) by mouth every 12 (twelve) hours. What changed:  when to take this reasons to take this   Eliquis  5 MG Tabs tablet Generic drug: apixaban  Take 1 tablet (5 mg total) by mouth 2 (two) times daily. What  changed: See the new instructions.   ezetimibe  10 MG tablet Commonly known as: ZETIA  Take 1 tablet (10 mg total) by mouth daily.   Farxiga  10 MG Tabs tablet Generic drug: dapagliflozin  propanediol Take 1 tablet (10 mg total) by mouth daily.   mexiletine 150 MG capsule Commonly known as: MEXITIL  Take 2 capsules (300 mg total) by mouth 2 (two) times daily.   milrinone  20 MG/100 ML Soln infusion Commonly known as: PRIMACOR  Inject 0.0157 mg/min into the vein continuous.   nitroGLYCERIN  0.4 MG SL tablet Commonly known as: Nitrostat  Place 1 tablet (0.4 mg total) under the tongue every 5 (five) minutes as needed for chest pain.   pantoprazole  40 MG tablet Commonly known as: PROTONIX  Take 1 tablet (40 mg total) by mouth daily.   sacubitril -valsartan  49-51 MG Commonly known as: ENTRESTO  Take 1 tablet by mouth 2 (two) times daily. Replaces: Entresto  24-26 MG   senna-docusate 8.6-50 MG tablet Commonly known as: Senokot-S Take 1 tablet by mouth 2 (two) times daily.   spironolactone  25 MG tablet Commonly known as: ALDACTONE  Take 1 tablet (25 mg total) by mouth daily.   torsemide  20 MG tablet Commonly known as: DEMADEX  Take 1 tablet (20 mg total) by mouth every other day. What changed: when to take this   traMADol  50 MG tablet Commonly known as: ULTRAM  Take 1 tablet (50 mg total) by mouth every 6 (six) hours as needed. What changed: reasons to take this               Durable Medical Equipment  (From admission, onward)           Start     Ordered   03/11/24 0855  For home use only DME Vest life vest  Once       Question Answer Comment  Indication: DCM (including NICM) with an EF of less than or equal to 35%   Life Vest Setting: VT Heart Rate Threshold - Default 150 BPM   Life Vest Setting: VF Heart Rate Threshold - Default 200 BPM   Life Vest Setting: Treatment Energy - Default 150 Joules, all five shocks   Length of need: 3 months   Start date: 03/11/24       03/11/24 0854           Allergies[1]  Follow-up Information     [facility] Follow up on 03/27/2024.   Specialty: Cardiology Why: Follow up in the Advanced Heart Failure Clinic 03/28/23 at 9:30 Entrance C, free valet Contact information: [address] [place]  [phone]                 The results of significant diagnostics from this hospitalization (including imaging, microbiology, ancillary and laboratory) are listed below for reference.    Significant Diagnostic Studies: IR TUNNELED CENTRAL  VENOUS CATH [facility] W IMG Result Date: 03/16/2024 INDICATION: Ischemic cardiomyopathy. Durable venous access needed for home Milrinone  therapy. EXAM: TUNNELED PICC LINE WITH ULTRASOUND AND FLUOROSCOPIC GUIDANCE MEDICATIONS: . The antibiotic was given in an appropriate time interval prior to skin puncture. FLUOROSCOPY: Radiation Exposure Index (as provided by the fluoroscopic device): 2 mGy Kerma COMPLICATIONS: None immediate. PROCEDURE: Informed written consent was obtained from the patient after a discussion of the risks, benefits, and alternatives to treatment. Questions regarding the procedure were encouraged and answered. The right neck and chest were prepped with chlorhexidine  in a sterile fashion, and a sterile drape was applied covering the operative field. Maximum barrier sterile technique with sterile gowns and gloves were used for the procedure. A timeout was performed prior to the initiation of the procedure. After creating a small venotomy incision, a micropuncture kit was utilized to access the right internal jugular vein under direct, real-time ultrasound guidance after the overlying soft tissues were anesthetized with 1% lidocaine  with epinephrine . Ultrasound image documentation was performed. The microwire was kinked to measure appropriate catheter length. The micropuncture sheath was exchanged for a  peel-away sheath over a guidewire. A 5 French single lumen tunneled PICC measuring 26 cm was tunneled in a retrograde fashion from the anterior chest wall to the venotomy incision. The catheter was then placed through the peel-away sheath with tip ultimately positioned at the superior caval-atrial junction. Final catheter positioning was confirmed and documented with a spot radiographic image. The catheter aspirates and flushes normally. The catheter was flushed with appropriate volume heparin  dwells. The catheter exit site was secured with a 0-Prolene retention suture. The venotomy incision was closed with an interrupted 4-0 Vicryl, Dermabond and Steri-strips. Dressings were applied. The patient tolerated the procedure well without immediate post procedural complication. FINDINGS: After catheter placement, the tip lies within the superior cavoatrial junction. The catheter aspirates and flushes normally and is ready for immediate use. IMPRESSION: Successful placement of 26cm single lumen tunneled PICC catheter via the right internal jugular vein with tip terminating at the superior caval atrial junction. The catheter is ready for immediate use. Electronically Signed   By: Cordella Banner   On: 03/16/2024 21:29   VAS US  LOWER EXTREMITY VENOUS (DVT) Result Date: 03/10/2024  Lower Venous DVT Study Patient Name:  Reginald Fernandez  Date of Exam:   03/10/2024 Medical Rec #: [id number]       Accession #:    [id number] Date of Birth: [birth year]       Patient Gender: M Patient Age:   26 years Exam Location:  [facility] Procedure:      VAS US  LOWER EXTREMITY VENOUS (DVT) Referring Phys: TORIBIO BENSIMHON --------------------------------------------------------------------------------  Indications: Pre-VAD.  Risk Factors: None identified. Comparison Study: No prior studies. Performing Technologist: Cordella Collet RVT  Examination Guidelines: A complete evaluation includes B-mode imaging, spectral Doppler, color  Doppler, and power Doppler as needed of all accessible portions of each vessel. Bilateral testing is considered an integral part of a complete examination. Limited examinations for reoccurring indications may be performed as noted. The reflux portion of the exam is performed with the patient in reverse Trendelenburg.  +---------+---------------+---------+-----------+----------+--------------+ RIGHT    CompressibilityPhasicitySpontaneityPropertiesThrombus Aging +---------+---------------+---------+-----------+----------+--------------+ CFV      Full           Yes      Yes                                 +---------+---------------+---------+-----------+----------+--------------+  SFJ      Full                                                        +---------+---------------+---------+-----------+----------+--------------+ FV Prox  Full                                                        +---------+---------------+---------+-----------+----------+--------------+ FV Mid   Full                                                        +---------+---------------+---------+-----------+----------+--------------+ FV DistalFull                                                        +---------+---------------+---------+-----------+----------+--------------+ PFV      Full                                                        +---------+---------------+---------+-----------+----------+--------------+ POP      Full           Yes      Yes                                 +---------+---------------+---------+-----------+----------+--------------+ PTV      Full                                                        +---------+---------------+---------+-----------+----------+--------------+ PERO     Full                                                        +---------+---------------+---------+-----------+----------+--------------+    +---------+---------------+---------+-----------+----------+--------------+ LEFT     CompressibilityPhasicitySpontaneityPropertiesThrombus Aging +---------+---------------+---------+-----------+----------+--------------+ CFV      Full           Yes      Yes                                 +---------+---------------+---------+-----------+----------+--------------+ SFJ      Full                                                        +---------+---------------+---------+-----------+----------+--------------+  FV Prox  Full                                                        +---------+---------------+---------+-----------+----------+--------------+ FV Mid   Full                                                        +---------+---------------+---------+-----------+----------+--------------+ FV DistalFull                                                        +---------+---------------+---------+-----------+----------+--------------+ PFV      Full                                                        +---------+---------------+---------+-----------+----------+--------------+ POP      Full           Yes      Yes                                 +---------+---------------+---------+-----------+----------+--------------+ PTV      Full                                                        +---------+---------------+---------+-----------+----------+--------------+ PERO     Full                                                        +---------+---------------+---------+-----------+----------+--------------+     Summary: RIGHT: - There is no evidence of deep vein thrombosis in the lower extremity.  - No cystic structure found in the popliteal fossa.  LEFT: - There is no evidence of deep vein thrombosis in the lower extremity.  - No cystic structure found in the popliteal fossa.  *See table(s) above for measurements and observations. Electronically signed  by Lonni Gaskins MD on 03/10/2024 at 5:03:31 PM.    Final    VAS US  DOPPLER PRE VAD Result Date: 03/10/2024 PERIOPERATIVE VASCULAR EVALUATION Patient Name:  Reginald Fernandez  Date of Exam:   03/10/2024 Medical Rec #: [id number]       Accession #:    [id number] Date of Birth: June 09, 1980       Patient Gender: M Patient Age:   67 years Exam Location:  [facility] Procedure:      VAS US  DOPPLER PRE VAD Referring Phys: TORIBIO BENSIMHON --------------------------------------------------------------------------------  Indications:      Pre-VAD. Risk Factors:  Hypertension, hyperlipidemia. Comparison Study: No prior studies. Performing Technologist: Gerome Ny RVT  Examination Guidelines: A complete evaluation includes B-mode imaging, spectral Doppler, color Doppler, and power Doppler as needed of all accessible portions of each vessel. Bilateral testing is considered an integral part of a complete examination. Limited examinations for reoccurring indications may be performed as noted.  Right Carotid Findings: +----------+--------+--------+--------+-----------------------+--------+           PSV cm/sEDV cm/sStenosisDescribe               Comments +----------+--------+--------+--------+-----------------------+--------+ CCA Prox  42      8               smooth and heterogenous         +----------+--------+--------+--------+-----------------------+--------+ CCA Distal54      13              smooth and heterogenous         +----------+--------+--------+--------+-----------------------+--------+ ICA Prox  44      16                                              +----------+--------+--------+--------+-----------------------+--------+ ICA Mid   39      26                                              +----------+--------+--------+--------+-----------------------+--------+ ICA Distal38      16                                               +----------+--------+--------+--------+-----------------------+--------+ ECA       56      11                                              +----------+--------+--------+--------+-----------------------+--------+ +----------+--------+-------+--------+------------+           PSV cm/sEDV cmsDescribeArm Pressure +----------+--------+-------+--------+------------+ Subclavian73                                  +----------+--------+-------+--------+------------+ +---------+--------+--+--------+--+---------+ VertebralPSV cm/s26EDV cm/s12Antegrade +---------+--------+--+--------+--+---------+ Left Carotid Findings: +----------+--------+--------+--------+-----------------------+--------+           PSV cm/sEDV cm/sStenosisDescribe               Comments +----------+--------+--------+--------+-----------------------+--------+ CCA Prox  57                                                      +----------+--------+--------+--------+-----------------------+--------+ CCA Distal51      10              smooth and heterogenous         +----------+--------+--------+--------+-----------------------+--------+ ICA Prox  30      13                                              +----------+--------+--------+--------+-----------------------+--------+  ICA Mid   38      20                                              +----------+--------+--------+--------+-----------------------+--------+ ICA Distal48      21                                              +----------+--------+--------+--------+-----------------------+--------+ ECA       32      6                                               +----------+--------+--------+--------+-----------------------+--------+ +----------+--------+--------+--------+------------+ SubclavianPSV cm/sEDV cm/sDescribeArm Pressure +----------+--------+--------+--------+------------+           65                      127           +----------+--------+--------+--------+------------+ +---------+--------+--+--------+-+---------+ VertebralPSV cm/s22EDV cm/s9Antegrade +---------+--------+--+--------+-+---------+  ABI Findings: +---------+------------------+-----+-----------+----------+ Right    Rt Pressure (mmHg)IndexWaveform   Comment    +---------+------------------+-----+-----------+----------+ Brachial                                   Restricted +---------+------------------+-----+-----------+----------+ PTA      146               1.15 multiphasic           +---------+------------------+-----+-----------+----------+ DP       131               1.03 triphasic             +---------+------------------+-----+-----------+----------+ Burnetta Skeeter                                           +---------+------------------+-----+-----------+----------+ +---------+------------------+-----+---------+-------+ Left     Lt Pressure (mmHg)IndexWaveform Comment +---------+------------------+-----+---------+-------+ Brachial 127                    triphasic        +---------+------------------+-----+---------+-------+ PTA      142               1.12 triphasic        +---------+------------------+-----+---------+-------+ DP       136               1.07 triphasic        +---------+------------------+-----+---------+-------+ Great Toe100                                     +---------+------------------+-----+---------+-------+ +-------+---------------+----------------+ ABI/TBIToday's ABI/TBIPrevious ABI/TBI +-------+---------------+----------------+ Right  1.15                            +-------+---------------+----------------+ Left   1.12                            +-------+---------------+----------------+  Summary: Right Carotid: Velocities in the right ICA are consistent with a 1-39% stenosis. Left Carotid: Velocities in the left ICA are consistent with a 1-39% stenosis.  Vertebrals: Bilateral vertebral arteries demonstrate antegrade flow.  *See table(s) above for measurements and observations. Right ABI: Resting right ankle-brachial index is within normal range. The right toe-brachial index is normal. Left ABI: Resting left ankle-brachial index is within normal range. The left toe-brachial index is normal.  Electronically signed by Lonni Gaskins MD on 03/10/2024 at 5:02:30 PM.    Final    CARDIAC CATHETERIZATION Result Date: 03/10/2024 Findings: RA = 6 RV = 38/5 PA = 36/22 (29) PCW =12 Fick cardiac output/index = 4.8/1.9 Thermo CO/CI = 4.3/1.7 PVR = 3.5 WU (Fick) 3.9 (TD) Ao sat = 94% PA sat = 69%, 70% PAPi = 2.3 Assessment: 1. Low output HF due to biventricular dysfunction with normal left siding filling pressures Plan/Discussion: Restart milrinone . Being w/u for advanced therapies (transplant). Will refer to [facility] as outpatient. Will likely need home milrinone  and LifeVest Toribio Fuel, MD 10:44 AM  US  EKG SITE RITE Result Date: 03/06/2024 If Site Rite image not attached, placement could not be confirmed due to current cardiac rhythm.  ECHOCARDIOGRAM COMPLETE Result Date: 03/05/2024    ECHOCARDIOGRAM REPORT   Patient Name:   Reginald Fernandez Date of Exam: 03/05/2024 Medical Rec #:  [id number]      Height:       74.0 in Accession #:    [id number]     Weight:       277.9 lb Date of Birth:  12-29-1980      BSA:          2.500 m Patient Age:    43 years       BP:           151/106 mmHg Patient Gender: M              HR:           94 bpm. Exam Location:  Inpatient Procedure: 2D Echo, Cardiac Doppler, Color Doppler and Intracardiac            Opacification Agent (Both Spectral and Color Flow Doppler were            utilized during procedure). Indications:    Dyspnea                 Elevated Troponin  History:        Patient has prior history of Echocardiogram examinations, most                 recent 08/04/2023. CHF and Cardiomyopathy, CAD and Previous                  Myocardial Infarction; Risk Factors:Hypertension, Dyslipidemia                 and Former Smoker.  Sonographer:    Juliene Rucks Referring Phys: [id number] AMY N COX  Sonographer Comments: Patient is obese. IMPRESSIONS  1. Left ventricular ejection fraction, by estimation, is <20%. The left ventricle has severely decreased function. The left ventricle demonstrates global hypokinesis. The left ventricular internal cavity size was moderately dilated. Left ventricular diastolic parameters are consistent with Grade II diastolic dysfunction (pseudonormalization).  2. Right ventricular systolic function is severely reduced. The right ventricular size is normal. There is normal pulmonary artery systolic pressure.  3. Left atrial size was severely dilated.  4. Right atrial size was mildly dilated.  5. The mitral valve is normal in structure. Trivial mitral valve regurgitation. No evidence of mitral stenosis.  6. The aortic valve is tricuspid. Aortic valve regurgitation is not visualized. No aortic stenosis is present.  7. The inferior vena cava is dilated in size with >50% respiratory variability, suggesting right atrial pressure of 8 mmHg. FINDINGS  Left Ventricle: Left ventricular ejection fraction, by estimation, is <20%. The left ventricle has severely decreased function. The left ventricle demonstrates global hypokinesis. Definity  contrast agent was given IV to delineate the left ventricular endocardial borders. The left ventricular internal cavity size was moderately dilated. There is no left ventricular hypertrophy. Left ventricular diastolic parameters are consistent with Grade II diastolic dysfunction (pseudonormalization). Right Ventricle: The right ventricular size is normal. No increase in right ventricular wall thickness. Right ventricular systolic function is severely reduced. There is normal pulmonary artery systolic pressure. The tricuspid regurgitant velocity is 1.47 m/s, and with an assumed right atrial  pressure of 8 mmHg, the estimated right ventricular systolic pressure is 16.6 mmHg. Left Atrium: Left atrial size was severely dilated. Right Atrium: Right atrial size was mildly dilated. Pericardium: Trivial pericardial effusion is present. Mitral Valve: The mitral valve is normal in structure. Trivial mitral valve regurgitation. No evidence of mitral valve stenosis. Tricuspid Valve: The tricuspid valve is normal in structure. Tricuspid valve regurgitation is trivial. No evidence of tricuspid stenosis. Aortic Valve: The aortic valve is tricuspid. Aortic valve regurgitation is not visualized. No aortic stenosis is present. Pulmonic Valve: The pulmonic valve was normal in structure. Pulmonic valve regurgitation is not visualized. No evidence of pulmonic stenosis. Aorta: The aortic root is normal in size and structure. Venous: The inferior vena cava is dilated in size with greater than 50% respiratory variability, suggesting right atrial pressure of 8 mmHg. IAS/Shunts: No atrial level shunt detected by color flow Doppler.  LEFT VENTRICLE PLAX 2D LVIDd:         6.50 cm   Diastology LVIDs:         5.90 cm   LV e' medial:  6.45 cm/s LV PW:         1.00 cm   LV e' lateral: 6.68 cm/s LV IVS:        1.00 cm LVOT diam:     2.00 cm LVOT Area:     3.14 cm  RIGHT VENTRICLE            IVC RV Basal diam:  3.30 cm    IVC diam: 2.30 cm RV Mid diam:    3.10 cm RV S prime:     9.75 cm/s TAPSE (M-mode): 1.2 cm LEFT ATRIUM            Index        RIGHT ATRIUM           Index LA diam:      5.90 cm  2.36 cm/m   RA Area:     25.20 cm LA Vol (A2C): 114.0 ml 45.61 ml/m  RA Volume:   83.00 ml  33.21 ml/m   AORTA Ao Root diam: 3.00 cm Ao Asc diam:  3.20 cm TRICUSPID VALVE TR Peak grad:   8.6 mmHg TR Vmax:        147.00 cm/s  SHUNTS Systemic Diam: 2.00 cm Annabella Scarce MD Electronically signed by Annabella Scarce MD Signature Date/Time: 03/05/2024/6:08:00 PM    Final    CT Angio Chest PE W and/or Wo Contrast Result Date:  03/04/2024 EXAM: CTA CHEST 03/04/2024 09:05:28  PM TECHNIQUE: CTA of the chest was performed without and with the administration of 100 mL of iohexol  (OMNIPAQUE ) 350 MG/ML injection. Multiplanar reformatted images are provided for review. MIP images are provided for review. Automated exposure control, iterative reconstruction, and/or weight based adjustment of the mA/kV was utilized to reduce the radiation dose to as low as reasonably achievable. COMPARISON: CT chest 5:19:25 cxr 03/04/24 CLINICAL HISTORY: Pulmonary embolism (PE) suspected, high prob. Patient reports shortness of breath since Friday. He also says he feels dehydrated from his fluid pills. He feels some chest congestion and tightness FINDINGS: PULMONARY ARTERIES: Pulmonary arteries are adequately opacified for evaluation. No acute pulmonary embolus is identified at the evaluable levels. Limited evaluation at the subsegmental level due to timing of contrast and motion artifact. Main pulmonary artery is normal in caliber. MEDIASTINUM: Cardiomegaly. At least 3-vessel coronary artery calcification. Incidentally noted aberrant right brachiocephalic artery. There is no acute abnormality of the thoracic aorta. LYMPH NODES: No mediastinal, hilar or axillary lymphadenopathy. LUNGS AND PLEURA: Diffuse mild bronchial wall thickening. Right middle lobe and linear atelectasis versus scarring. Interval resolution of right middle lobe consolidation. Expiratory phase of respiration with slight mosaic attenuation of the lower lung zones. Lingular atelectasis. No pulmonary edema. No evidence of pleural effusion or pneumothorax. UPPER ABDOMEN: Right posterior hemidiaphragmatic fat-containing hernia. SOFT TISSUES AND BONES: Trace bilateral gynecomastia. No acute bone abnormality. IMPRESSION: 1. No pulmonary embolism identified, although evaluation is limited at the subsegmental level due to timing of contrast and motion artifact. 2. Cardiomegaly with at least 3-vessel  coronary artery calcification. 3. Other, non-acute and/or normal findings as above. Electronically signed by: Morgane Naveau MD 03/04/2024 09:44 PM EST RP Workstation: [id number]   CT ABDOMEN PELVIS W CONTRAST Result Date: 03/04/2024 EXAM: CT ABDOMEN AND PELVIS WITH CONTRAST 03/04/2024 09:05:28 PM TECHNIQUE: CT of the abdomen and pelvis was performed with the administration of intravenous contrast (100 mL iohexol  (OMNIPAQUE ) 350 MG/ML injection). Multiplanar reformatted images are provided for review. Automated exposure control, iterative reconstruction, and/or weight-based adjustment of the mA/kV was utilized to reduce the radiation dose to as low as reasonably achievable. COMPARISON: CT angiography of the chest 03/04/2024. CT abdomen and pelvis 03:19:24 CLINICAL HISTORY: Abdominal pain, acute, nonlocalized. FINDINGS: LOWER CHEST: Please see separately dictated CT angiography of the chest 12:17:2025. LIVER: The liver is unremarkable. GALLBLADDER AND BILE DUCTS: Gallbladder is unremarkable. No biliary ductal dilatation. SPLEEN: No acute abnormality. PANCREAS: No acute abnormality. ADRENAL GLANDS: No acute abnormality. KIDNEYS, URETERS AND BLADDER: Bilateral renal cortical scarring. No stones in the kidneys or ureters. No hydronephrosis. No perinephric or periureteral stranding. Urinary bladder is unremarkable. GI AND BOWEL: Stomach demonstrates no acute abnormality. No small or large bowel thickening or dilatation. The appendix is unremarkable. Colonic diverticulosis. There is no bowel obstruction. PERITONEUM AND RETROPERITONEUM: No ascites. No free air. No abdominal or inguinal hernia. VASCULATURE: Aorta is normal in caliber. Moderate atherosclerotic plaque. LYMPH NODES: No lymphadenopathy. REPRODUCTIVE ORGANS: The prostate is unremarkable. BONES AND SOFT TISSUES: Small retrolisthesis of L5 on S1. Intervertebral disc space vacuum phenomenon at the L5-S1 level. No acute osseous abnormality. No focal soft tissue  abnormality. IMPRESSION: 1. No acute findings in the abdomen or pelvis. 2. Please see separately dictated CT angiography of the chest 12:17:2025. Electronically signed by: Morgane Naveau MD 03/04/2024 09:15 PM EST RP Workstation: [id number]   DG Chest 2 View Result Date: 03/04/2024 EXAM: 2 VIEW(S) XRAY OF THE CHEST 03/04/2024 06:44:00 PM COMPARISON: 08/03/2023 CLINICAL HISTORY: chest pain, shortness of breath FINDINGS: LUNGS  AND PLEURA: Trace superimposed pulmonary edema. No pleural effusion. No pneumothorax. HEART AND MEDIASTINUM: Stable cardiomegaly. Stable hilar enlargement likely reflecting pulmonary arterial enlargement in the setting of Pulmonary Arterial Hypertension. BONES AND SOFT TISSUES: No acute osseous abnormality. IMPRESSION: 1. Trace superimposed pulmonary edema, new from prior examination . 2. Stable cardiomegaly and hilar enlargement, likely reflecting pulmonary arterial enlargement in the setting of pulmonary arterial hypertension. Electronically signed by: Dorethia Molt MD 03/04/2024 06:48 PM EST RP Workstation: [id number]    Microbiology: No results found for this or any previous visit (from the past 240 hours).   Labs: Basic Metabolic Panel: Recent Labs  Lab 03/12/24 0603 03/13/24 0500 03/14/24 9491 03/15/24 0538 03/16/24 0335 03/17/24 0615 03/18/24 0539  NA 132* 131* 129* 132* 132* 131* 132*  K 4.7 4.6 4.4 4.5 4.2 4.5 4.4  CL 94* 94* 92* 94* 93* 95* 97*  CO2 25 26 26 28 28 27 26   GLUCOSE 113* 114* 141* 138* 141* 150* 127*  BUN 22* 24* 26* 27* 24* 20 17  CREATININE 1.21 1.19 1.19 1.31* 1.23 1.04 1.00  CALCIUM  9.0 9.1 8.8* 9.2 9.0 9.2 8.9  MG 2.4 2.6* 2.3 2.5*  --   --   --    Liver Function Tests: No results for input(s): AST, ALT, ALKPHOS, BILITOT, PROT, ALBUMIN in the last 168 hours. No results for input(s): LIPASE, AMYLASE in the last 168 hours. No results for input(s): AMMONIA in the last 168 hours. CBC: Recent Labs  Lab 03/16/24 0335  03/17/24 0615 03/18/24 0539  WBC 9.7 10.1 7.7  HGB 17.9* 17.1* 16.4  HCT 51.7 50.2 48.4  MCV 90.2 90.9 91.1  PLT 284 255 231   Cardiac Enzymes: No results for input(s): CKTOTAL, CKMB, CKMBINDEX, TROPONINI in the last 168 hours. BNP: BNP (last 3 results) Recent Labs    08/03/23 1028  BNP 892.8*    ProBNP (last 3 results) Recent Labs    03/04/24 1833  PROBNP 3,698.0*    CBG: Recent Labs  Lab 03/14/24 1217 03/17/24 0528  GLUCAP 117* 126*       Signed:  Sigurd Pac MD.  [facility] 03/18/2024, 2:23 PM        [1]  Allergies Allergen Reactions   Penicillins Other (See Comments)    Childhood allergy Unknown reaction  Did it involve swelling of the face/tongue/throat, SOB, or low BP? No Did it involve sudden or severe rash/hives, skin peeling, or any reaction on the inside of your mouth or nose? No Did you need to seek medical attention at a hospital or doctor's office? No When did it last happen?      child If all above answers are NO, may proceed with cephalosporin use.

## 2024-03-18 NOTE — Progress Notes (Signed)
 Holley Herring reached out to me today. LOG approved! Coverage to start tomorrow. She will be by early in the morning to hook him up to his home pump.   Has close follow up in AHF clinic on the 9th.   Will wear lifevest at discharge.   Meds sent to [facility] pharmacy today as they are closed tomorrow. RN to go pick up and hold until discharge.   Dr. Fairy and Dr. Rolan made aware. Patient notified.   Beckey LITTIE Coe AGACNP-BC  Advanced Heart Failure Team  03/18/2024

## 2024-03-18 NOTE — Discharge Summary (Deleted)
 Physician Discharge Summary  Reginald Fernandez FMW:[id number] DOB: 1980/07/01 DOA: 03/04/2024  PCP: [name], MD  Admit date: 03/04/2024 Discharge date: 03/18/2024  Time spent: 35 minutes  Recommendations for Outpatient Follow-up:  Advanced heart failure clinic   Discharge Diagnoses:  Principal Problem:   Acute on chronic systolic CHF (congestive heart failure) (HCC) Low output CHF NSVT, PVCs   CAD S/P percutaneous coronary angioplasty: DES PCI -Cx-OM1, RCA x 2   Essential hypertension   Pulmonary emboli (HCC)   AKI (acute kidney injury)   Hyperlipidemia LDL goal <70   Obesity, class 2   Discharge Condition: Improved  Diet recommendation: Heart healthy  Filed Weights   03/16/24 0456 03/17/24 0425 03/18/24 0431  Weight: 118.5 kg 123.4 kg 119.5 kg    History of present illness:  43/M with history of CAD, PE, systolic CHF admitted with low output heart failure  Hospital Course:   Acute on chronic systolic CHF, BiV failure -Admitted with volume overload, concern for low output initially -Diuresed well with IV Lasix  and milrinone  -Followed by CHF team, transition to oral digoxin , Entresto , Aldactone  and Farxiga  -Plan for home milrinone  at discharge, plan to [facility] as outpatient for [facility] eval, life vest at bedside -Torsemide  changed to 20 mg every other day - Needs close follow-up in heart failure clinic   PVCs -High burden especially on milrinone , on amiodarone  400 mg twice daily now, plan for 200 mg as outpatient with mexiletine 300 mg twice daily   Abdominal Pain - Likely related to low output, resolved, CT was unremarkable  Acute kidney injury Resolved   CAD  - History of STEMI in 9/15, DES to left circumflex and DES to RCA  - Repeat LHC 7/23 with diffuse CAD  - Continue atorvastatin  and zetia    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  Discharge Exam: Vitals:   03/18/24 0431 03/18/24 0847  BP: 124/87 106/70  Pulse: (!) 41 75  Resp: 18 20  Temp:  97.8 F (36.6 C) 97.9 F (36.6 C)  SpO2: 100% 100%    Gen: Awake, Alert, Oriented X 3,  HEENT: no JVD,  Lungs: Good air movement bilaterally, CTAB CVS: S1S2/RRR Abd: soft, Non tender, non distended, BS present Extremities: No edema Skin: no new rashes on exposed skin    Discharge Instructions    Allergies as of 03/18/2024       Reactions   Penicillins Other (See Comments)   Childhood allergy Unknown reaction Did it involve swelling of the face/tongue/throat, SOB, or low BP? No Did it involve sudden or severe rash/hives, skin peeling, or any reaction on the inside of your mouth or nose? No Did you need to seek medical attention at a hospital or doctor's office? No When did it last happen?      child If all above answers are NO, may proceed with cephalosporin use.        Medication List     STOP taking these medications    Entresto  24-26 MG Generic drug: sacubitril -valsartan  Replaced by: sacubitril -valsartan  49-51 MG       TAKE these medications    acetaminophen  325 MG tablet Commonly known as: TYLENOL  Take 2 tablets (650 mg total) by mouth every 6 (six) hours as needed for mild pain (or Fever >/= 101).   amiodarone  200 MG tablet Commonly known as: PACERONE  Take 1 tablet (200 mg total) by mouth daily. What changed: See the new instructions.   apixaban  5 MG Tabs tablet Commonly known as: ELIQUIS   Take 1 tablet (5 mg total) by mouth 2 (two) times daily. What changed: See the new instructions.   atorvastatin  80 MG tablet Commonly known as: LIPITOR  Take 1 tablet (80 mg total) by mouth daily at 6 PM   colchicine  0.6 MG tablet Take 1 tablet (0.6 mg total) by mouth daily for 7 days.   dapagliflozin  propanediol 10 MG Tabs tablet Commonly known as: FARXIGA  Take 1 tablet (10 mg total) by mouth daily.   digoxin  0.125 MG tablet Commonly known as: LANOXIN  Take 0.5 tablets (0.0625 mg total) by mouth daily. What changed: how much to take   docusate sodium   100 MG capsule Commonly known as: COLACE Take 1 capsule (100 mg total) by mouth every 12 (twelve) hours. What changed:  when to take this reasons to take this   ezetimibe  10 MG tablet Commonly known as: ZETIA  Take 1 tablet (10 mg total) by mouth daily.   mexiletine 150 MG capsule Commonly known as: MEXITIL  Take 2 capsules (300 mg total) by mouth 2 (two) times daily.   milrinone  20 MG/100 ML Soln infusion Commonly known as: PRIMACOR  Inject 0.0157 mg/min into the vein continuous.   nitroGLYCERIN  0.4 MG SL tablet Commonly known as: Nitrostat  Place 1 tablet (0.4 mg total) under the tongue every 5 (five) minutes as needed for chest pain.   pantoprazole  40 MG tablet Commonly known as: PROTONIX  Take 1 tablet (40 mg total) by mouth daily.   sacubitril -valsartan  49-51 MG Commonly known as: ENTRESTO  Take 1 tablet by mouth 2 (two) times daily. Replaces: Entresto  24-26 MG   senna-docusate 8.6-50 MG tablet Commonly known as: Senokot-S Take 1 tablet by mouth 2 (two) times daily.   spironolactone  25 MG tablet Commonly known as: ALDACTONE  Take 1 tablet (25 mg total) by mouth daily.   torsemide  20 MG tablet Commonly known as: DEMADEX  Take 1 tablet (20 mg total) by mouth every other day. What changed: when to take this   traMADol  50 MG tablet Commonly known as: ULTRAM  Take 1 tablet (50 mg total) by mouth every 6 (six) hours as needed. What changed: reasons to take this               Durable Medical Equipment  (From admission, onward)           Start     Ordered   03/11/24 0855  For home use only DME Vest life vest  Once       Question Answer Comment  Indication: DCM (including NICM) with an EF of less than or equal to 35%   Life Vest Setting: VT Heart Rate Threshold - Default 150 BPM   Life Vest Setting: VF Heart Rate Threshold - Default 200 BPM   Life Vest Setting: Treatment Energy - Default 150 Joules, all five shocks   Length of need: 3 months   Start date:  03/11/24      03/11/24 0854           Allergies[1]    The results of significant diagnostics from this hospitalization (including imaging, microbiology, ancillary and laboratory) are listed below for reference.    Significant Diagnostic Studies: IR TUNNELED CENTRAL VENOUS CATH [facility] W IMG Result Date: 03/16/2024 INDICATION: Ischemic cardiomyopathy. Durable venous access needed for home Milrinone  therapy. EXAM: TUNNELED PICC LINE WITH ULTRASOUND AND FLUOROSCOPIC GUIDANCE MEDICATIONS: . The antibiotic was given in an appropriate time interval prior to skin puncture. FLUOROSCOPY: Radiation Exposure Index (as provided by the fluoroscopic device): 2 mGy Kerma COMPLICATIONS: None  immediate. PROCEDURE: Informed written consent was obtained from the patient after a discussion of the risks, benefits, and alternatives to treatment. Questions regarding the procedure were encouraged and answered. The right neck and chest were prepped with chlorhexidine  in a sterile fashion, and a sterile drape was applied covering the operative field. Maximum barrier sterile technique with sterile gowns and gloves were used for the procedure. A timeout was performed prior to the initiation of the procedure. After creating a small venotomy incision, a micropuncture kit was utilized to access the right internal jugular vein under direct, real-time ultrasound guidance after the overlying soft tissues were anesthetized with 1% lidocaine  with epinephrine . Ultrasound image documentation was performed. The microwire was kinked to measure appropriate catheter length. The micropuncture sheath was exchanged for a peel-away sheath over a guidewire. A 5 French single lumen tunneled PICC measuring 26 cm was tunneled in a retrograde fashion from the anterior chest wall to the venotomy incision. The catheter was then placed through the peel-away sheath with tip ultimately positioned at the superior caval-atrial junction. Final catheter  positioning was confirmed and documented with a spot radiographic image. The catheter aspirates and flushes normally. The catheter was flushed with appropriate volume heparin  dwells. The catheter exit site was secured with a 0-Prolene retention suture. The venotomy incision was closed with an interrupted 4-0 Vicryl, Dermabond and Steri-strips. Dressings were applied. The patient tolerated the procedure well without immediate post procedural complication. FINDINGS: After catheter placement, the tip lies within the superior cavoatrial junction. The catheter aspirates and flushes normally and is ready for immediate use. IMPRESSION: Successful placement of 26cm single lumen tunneled PICC catheter via the right internal jugular vein with tip terminating at the superior caval atrial junction. The catheter is ready for immediate use. Electronically Signed   By: Cordella Banner   On: 03/16/2024 21:29   VAS US  LOWER EXTREMITY VENOUS (DVT) Result Date: 03/10/2024  Lower Venous DVT Study Patient Name:  Reginald Fernandez  Date of Exam:   03/10/2024 Medical Rec #: [id number]       Accession #:    [id number] Date of Birth: [birth year]       Patient Gender: M Patient Age:   78 years Exam Location:  [facility] Procedure:      VAS US  LOWER EXTREMITY VENOUS (DVT) Referring Phys: TORIBIO BENSIMHON --------------------------------------------------------------------------------  Indications: Pre-VAD.  Risk Factors: None identified. Comparison Study: No prior studies. Performing Technologist: Cordella Collet RVT  Examination Guidelines: A complete evaluation includes B-mode imaging, spectral Doppler, color Doppler, and power Doppler as needed of all accessible portions of each vessel. Bilateral testing is considered an integral part of a complete examination. Limited examinations for reoccurring indications may be performed as noted. The reflux portion of the exam is performed with the patient in reverse Trendelenburg.   +---------+---------------+---------+-----------+----------+--------------+ RIGHT    CompressibilityPhasicitySpontaneityPropertiesThrombus Aging +---------+---------------+---------+-----------+----------+--------------+ CFV      Full           Yes      Yes                                 +---------+---------------+---------+-----------+----------+--------------+ SFJ      Full                                                        +---------+---------------+---------+-----------+----------+--------------+  FV Prox  Full                                                        +---------+---------------+---------+-----------+----------+--------------+ FV Mid   Full                                                        +---------+---------------+---------+-----------+----------+--------------+ FV DistalFull                                                        +---------+---------------+---------+-----------+----------+--------------+ PFV      Full                                                        +---------+---------------+---------+-----------+----------+--------------+ POP      Full           Yes      Yes                                 +---------+---------------+---------+-----------+----------+--------------+ PTV      Full                                                        +---------+---------------+---------+-----------+----------+--------------+ PERO     Full                                                        +---------+---------------+---------+-----------+----------+--------------+   +---------+---------------+---------+-----------+----------+--------------+ LEFT     CompressibilityPhasicitySpontaneityPropertiesThrombus Aging +---------+---------------+---------+-----------+----------+--------------+ CFV      Full           Yes      Yes                                  +---------+---------------+---------+-----------+----------+--------------+ SFJ      Full                                                        +---------+---------------+---------+-----------+----------+--------------+ FV Prox  Full                                                        +---------+---------------+---------+-----------+----------+--------------+  FV Mid   Full                                                        +---------+---------------+---------+-----------+----------+--------------+ FV DistalFull                                                        +---------+---------------+---------+-----------+----------+--------------+ PFV      Full                                                        +---------+---------------+---------+-----------+----------+--------------+ POP      Full           Yes      Yes                                 +---------+---------------+---------+-----------+----------+--------------+ PTV      Full                                                        +---------+---------------+---------+-----------+----------+--------------+ PERO     Full                                                        +---------+---------------+---------+-----------+----------+--------------+     Summary: RIGHT: - There is no evidence of deep vein thrombosis in the lower extremity.  - No cystic structure found in the popliteal fossa.  LEFT: - There is no evidence of deep vein thrombosis in the lower extremity.  - No cystic structure found in the popliteal fossa.  *See table(s) above for measurements and observations. Electronically signed by Lonni Gaskins MD on 03/10/2024 at 5:03:31 PM.    Final    VAS US  DOPPLER PRE VAD Result Date: 03/10/2024 PERIOPERATIVE VASCULAR EVALUATION Patient Name:  Reginald Fernandez  Date of Exam:   03/10/2024 Medical Rec #: [id number]       Accession #:    [id number] Date of Birth: 02/25/1981       Patient  Gender: M Patient Age:   16 years Exam [facility]  [facility] Procedure:      VAS US  DOPPLER PRE VAD Referring Phys: TORIBIO BENSIMHON --------------------------------------------------------------------------------  Indications:      Pre-VAD. Risk Factors:     Hypertension, hyperlipidemia. Comparison Study: No prior studies. Performing Technologist: Gerome Ny RVT  Examination Guidelines: A complete evaluation includes B-mode imaging, spectral Doppler, color Doppler, and power Doppler as needed of all accessible portions of each vessel. Bilateral testing is considered an integral part of a complete examination. Limited examinations for reoccurring indications may be performed as noted.  Right  Carotid Findings: +----------+--------+--------+--------+-----------------------+--------+           PSV cm/sEDV cm/sStenosisDescribe               Comments +----------+--------+--------+--------+-----------------------+--------+ CCA Prox  42      8               smooth and heterogenous         +----------+--------+--------+--------+-----------------------+--------+ CCA Distal54      13              smooth and heterogenous         +----------+--------+--------+--------+-----------------------+--------+ ICA Prox  44      16                                              +----------+--------+--------+--------+-----------------------+--------+ ICA Mid   39      26                                              +----------+--------+--------+--------+-----------------------+--------+ ICA Distal38      16                                              +----------+--------+--------+--------+-----------------------+--------+ ECA       56      11                                              +----------+--------+--------+--------+-----------------------+--------+ +----------+--------+-------+--------+------------+           PSV cm/sEDV cmsDescribeArm Pressure  +----------+--------+-------+--------+------------+ Subclavian73                                  +----------+--------+-------+--------+------------+ +---------+--------+--+--------+--+---------+ VertebralPSV cm/s26EDV cm/s12Antegrade +---------+--------+--+--------+--+---------+ Left Carotid Findings: +----------+--------+--------+--------+-----------------------+--------+           PSV cm/sEDV cm/sStenosisDescribe               Comments +----------+--------+--------+--------+-----------------------+--------+ CCA Prox  57                                                      +----------+--------+--------+--------+-----------------------+--------+ CCA Distal51      10              smooth and heterogenous         +----------+--------+--------+--------+-----------------------+--------+ ICA Prox  30      13                                              +----------+--------+--------+--------+-----------------------+--------+ ICA Mid   38      20                                              +----------+--------+--------+--------+-----------------------+--------+  ICA Distal48      21                                              +----------+--------+--------+--------+-----------------------+--------+ ECA       32      6                                               +----------+--------+--------+--------+-----------------------+--------+ +----------+--------+--------+--------+------------+ SubclavianPSV cm/sEDV cm/sDescribeArm Pressure +----------+--------+--------+--------+------------+           65                      127          +----------+--------+--------+--------+------------+ +---------+--------+--+--------+-+---------+ VertebralPSV cm/s22EDV cm/s9Antegrade +---------+--------+--+--------+-+---------+  ABI Findings: +---------+------------------+-----+-----------+----------+ Right    Rt Pressure (mmHg)IndexWaveform   Comment     +---------+------------------+-----+-----------+----------+ Brachial                                   Restricted +---------+------------------+-----+-----------+----------+ PTA      146               1.15 multiphasic           +---------+------------------+-----+-----------+----------+ DP       131               1.03 triphasic             +---------+------------------+-----+-----------+----------+ Burnetta Skeeter                                           +---------+------------------+-----+-----------+----------+ +---------+------------------+-----+---------+-------+ Left     Lt Pressure (mmHg)IndexWaveform Comment +---------+------------------+-----+---------+-------+ Brachial 127                    triphasic        +---------+------------------+-----+---------+-------+ PTA      142               1.12 triphasic        +---------+------------------+-----+---------+-------+ DP       136               1.07 triphasic        +---------+------------------+-----+---------+-------+ Great Toe100                                     +---------+------------------+-----+---------+-------+ +-------+---------------+----------------+ ABI/TBIToday's ABI/TBIPrevious ABI/TBI +-------+---------------+----------------+ Right  1.15                            +-------+---------------+----------------+ Left   1.12                            +-------+---------------+----------------+  Summary: Right Carotid: Velocities in the right ICA are consistent with a 1-39% stenosis. Left Carotid: Velocities in the left ICA are consistent with a 1-39% stenosis. Vertebrals: Bilateral vertebral arteries demonstrate antegrade flow.  *See table(s) above for measurements and observations. Right ABI: Resting right ankle-brachial  index is within normal range. The right toe-brachial index is normal. Left ABI: Resting left ankle-brachial index is within normal range. The left toe-brachial index is  normal.  Electronically signed by Lonni Gaskins MD on 03/10/2024 at 5:02:30 PM.    Final    CARDIAC CATHETERIZATION Result Date: 03/10/2024 Findings: RA = 6 RV = 38/5 PA = 36/22 (29) PCW =12 Fick cardiac output/index = 4.8/1.9 Thermo CO/CI = 4.3/1.7 PVR = 3.5 WU (Fick) 3.9 (TD) Ao sat = 94% PA sat = 69%, 70% PAPi = 2.3 Assessment: 1. Low output HF due to biventricular dysfunction with normal left siding filling pressures Plan/Discussion: Restart milrinone . Being w/u for advanced therapies (transplant). Will refer to [facility] as outpatient. Will likely need home milrinone  and LifeVest Toribio Fuel, MD 10:44 AM  US  EKG SITE RITE Result Date: 03/06/2024 If Site Rite image not attached, placement could not be confirmed due to current cardiac rhythm.  ECHOCARDIOGRAM COMPLETE Result Date: 03/05/2024    ECHOCARDIOGRAM REPORT   Patient Name:   Reginald Fernandez Date of Exam: 03/05/2024 Medical Rec #:  [id number]      Height:       74.0 in Accession #:    [id number]     Weight:       277.9 lb Date of Birth:  1981/01/26      BSA:          2.500 m Patient Age:    43 years       BP:           151/106 mmHg Patient Gender: M              HR:           94 bpm. Exam Location:  Inpatient Procedure: 2D Echo, Cardiac Doppler, Color Doppler and Intracardiac            Opacification Agent (Both Spectral and Color Flow Doppler were            utilized during procedure). Indications:    Dyspnea                 Elevated Troponin  History:        Patient has prior history of Echocardiogram examinations, most                 recent 08/04/2023. CHF and Cardiomyopathy, CAD and Previous                 Myocardial Infarction; Risk Factors:Hypertension, Dyslipidemia                 and Former Smoker.  Sonographer:    Juliene Rucks Referring Phys: [id number] AMY N COX  Sonographer Comments: Patient is obese. IMPRESSIONS  1. Left ventricular ejection fraction, by estimation, is <20%. The left ventricle has severely decreased function. The  left ventricle demonstrates global hypokinesis. The left ventricular internal cavity size was moderately dilated. Left ventricular diastolic parameters are consistent with Grade II diastolic dysfunction (pseudonormalization).  2. Right ventricular systolic function is severely reduced. The right ventricular size is normal. There is normal pulmonary artery systolic pressure.  3. Left atrial size was severely dilated.  4. Right atrial size was mildly dilated.  5. The mitral valve is normal in structure. Trivial mitral valve regurgitation. No evidence of mitral stenosis.  6. The aortic valve is tricuspid. Aortic valve regurgitation is not visualized. No aortic stenosis is present.  7. The inferior vena cava is dilated in size with >50%  respiratory variability, suggesting right atrial pressure of 8 mmHg. FINDINGS  Left Ventricle: Left ventricular ejection fraction, by estimation, is <20%. The left ventricle has severely decreased function. The left ventricle demonstrates global hypokinesis. Definity  contrast agent was given IV to delineate the left ventricular endocardial borders. The left ventricular internal cavity size was moderately dilated. There is no left ventricular hypertrophy. Left ventricular diastolic parameters are consistent with Grade II diastolic dysfunction (pseudonormalization). Right Ventricle: The right ventricular size is normal. No increase in right ventricular wall thickness. Right ventricular systolic function is severely reduced. There is normal pulmonary artery systolic pressure. The tricuspid regurgitant velocity is 1.47 m/s, and with an assumed right atrial pressure of 8 mmHg, the estimated right ventricular systolic pressure is 16.6 mmHg. Left Atrium: Left atrial size was severely dilated. Right Atrium: Right atrial size was mildly dilated. Pericardium: Trivial pericardial effusion is present. Mitral Valve: The mitral valve is normal in structure. Trivial mitral valve regurgitation. No  evidence of mitral valve stenosis. Tricuspid Valve: The tricuspid valve is normal in structure. Tricuspid valve regurgitation is trivial. No evidence of tricuspid stenosis. Aortic Valve: The aortic valve is tricuspid. Aortic valve regurgitation is not visualized. No aortic stenosis is present. Pulmonic Valve: The pulmonic valve was normal in structure. Pulmonic valve regurgitation is not visualized. No evidence of pulmonic stenosis. Aorta: The aortic root is normal in size and structure. Venous: The inferior vena cava is dilated in size with greater than 50% respiratory variability, suggesting right atrial pressure of 8 mmHg. IAS/Shunts: No atrial level shunt detected by color flow Doppler.  LEFT VENTRICLE PLAX 2D LVIDd:         6.50 cm   Diastology LVIDs:         5.90 cm   LV e' medial:  6.45 cm/s LV PW:         1.00 cm   LV e' lateral: 6.68 cm/s LV IVS:        1.00 cm LVOT diam:     2.00 cm LVOT Area:     3.14 cm  RIGHT VENTRICLE            IVC RV Basal diam:  3.30 cm    IVC diam: 2.30 cm RV Mid diam:    3.10 cm RV S prime:     9.75 cm/s TAPSE (M-mode): 1.2 cm LEFT ATRIUM            Index        RIGHT ATRIUM           Index LA diam:      5.90 cm  2.36 cm/m   RA Area:     25.20 cm LA Vol (A2C): 114.0 ml 45.61 ml/m  RA Volume:   83.00 ml  33.21 ml/m   AORTA Ao Root diam: 3.00 cm Ao Asc diam:  3.20 cm TRICUSPID VALVE TR Peak grad:   8.6 mmHg TR Vmax:        147.00 cm/s  SHUNTS Systemic Diam: 2.00 cm Annabella Scarce MD Electronically signed by Annabella Scarce MD Signature Date/Time: 03/05/2024/6:08:00 PM    Final    CT Angio Chest PE W and/or Wo Contrast Result Date: 03/04/2024 EXAM: CTA CHEST 03/04/2024 09:05:28 PM TECHNIQUE: CTA of the chest was performed without and with the administration of 100 mL of iohexol  (OMNIPAQUE ) 350 MG/ML injection. Multiplanar reformatted images are provided for review. MIP images are provided for review. Automated exposure control, iterative reconstruction, and/or weight  based adjustment of the mA/kV  was utilized to reduce the radiation dose to as low as reasonably achievable. COMPARISON: CT chest 5:19:25 cxr 03/04/24 CLINICAL HISTORY: Pulmonary embolism (PE) suspected, high prob. Patient reports shortness of breath since Friday. He also says he feels dehydrated from his fluid pills. He feels some chest congestion and tightness FINDINGS: PULMONARY ARTERIES: Pulmonary arteries are adequately opacified for evaluation. No acute pulmonary embolus is identified at the evaluable levels. Limited evaluation at the subsegmental level due to timing of contrast and motion artifact. Main pulmonary artery is normal in caliber. MEDIASTINUM: Cardiomegaly. At least 3-vessel coronary artery calcification. Incidentally noted aberrant right brachiocephalic artery. There is no acute abnormality of the thoracic aorta. LYMPH NODES: No mediastinal, hilar or axillary lymphadenopathy. LUNGS AND PLEURA: Diffuse mild bronchial wall thickening. Right middle lobe and linear atelectasis versus scarring. Interval resolution of right middle lobe consolidation. Expiratory phase of respiration with slight mosaic attenuation of the lower lung zones. Lingular atelectasis. No pulmonary edema. No evidence of pleural effusion or pneumothorax. UPPER ABDOMEN: Right posterior hemidiaphragmatic fat-containing hernia. SOFT TISSUES AND BONES: Trace bilateral gynecomastia. No acute bone abnormality. IMPRESSION: 1. No pulmonary embolism identified, although evaluation is limited at the subsegmental level due to timing of contrast and motion artifact. 2. Cardiomegaly with at least 3-vessel coronary artery calcification. 3. Other, non-acute and/or normal findings as above. Electronically signed by: Morgane Naveau MD 03/04/2024 09:44 PM EST RP Workstation: [id number]   CT ABDOMEN PELVIS W CONTRAST Result Date: 03/04/2024 EXAM: CT ABDOMEN AND PELVIS WITH CONTRAST 03/04/2024 09:05:28 PM TECHNIQUE: CT of the abdomen and pelvis was  performed with the administration of intravenous contrast (100 mL iohexol  (OMNIPAQUE ) 350 MG/ML injection). Multiplanar reformatted images are provided for review. Automated exposure control, iterative reconstruction, and/or weight-based adjustment of the mA/kV was utilized to reduce the radiation dose to as low as reasonably achievable. COMPARISON: CT angiography of the chest 03/04/2024. CT abdomen and pelvis 03:19:24 CLINICAL HISTORY: Abdominal pain, acute, nonlocalized. FINDINGS: LOWER CHEST: Please see separately dictated CT angiography of the chest 12:17:2025. LIVER: The liver is unremarkable. GALLBLADDER AND BILE DUCTS: Gallbladder is unremarkable. No biliary ductal dilatation. SPLEEN: No acute abnormality. PANCREAS: No acute abnormality. ADRENAL GLANDS: No acute abnormality. KIDNEYS, URETERS AND BLADDER: Bilateral renal cortical scarring. No stones in the kidneys or ureters. No hydronephrosis. No perinephric or periureteral stranding. Urinary bladder is unremarkable. GI AND BOWEL: Stomach demonstrates no acute abnormality. No small or large bowel thickening or dilatation. The appendix is unremarkable. Colonic diverticulosis. There is no bowel obstruction. PERITONEUM AND RETROPERITONEUM: No ascites. No free air. No abdominal or inguinal hernia. VASCULATURE: Aorta is normal in caliber. Moderate atherosclerotic plaque. LYMPH NODES: No lymphadenopathy. REPRODUCTIVE ORGANS: The prostate is unremarkable. BONES AND SOFT TISSUES: Small retrolisthesis of L5 on S1. Intervertebral disc space vacuum phenomenon at the L5-S1 level. No acute osseous abnormality. No focal soft tissue abnormality. IMPRESSION: 1. No acute findings in the abdomen or pelvis. 2. Please see separately dictated CT angiography of the chest 12:17:2025. Electronically signed by: Morgane Naveau MD 03/04/2024 09:15 PM EST RP Workstation: [id number]   DG Chest 2 View Result Date: 03/04/2024 EXAM: 2 VIEW(S) XRAY OF THE CHEST 03/04/2024 06:44:00 PM  COMPARISON: 08/03/2023 CLINICAL HISTORY: chest pain, shortness of breath FINDINGS: LUNGS AND PLEURA: Trace superimposed pulmonary edema. No pleural effusion. No pneumothorax. HEART AND MEDIASTINUM: Stable cardiomegaly. Stable hilar enlargement likely reflecting pulmonary arterial enlargement in the setting of Pulmonary Arterial Hypertension. BONES AND SOFT TISSUES: No acute osseous abnormality. IMPRESSION: 1. Trace superimposed pulmonary edema, new from  prior examination . 2. Stable cardiomegaly and hilar enlargement, likely reflecting pulmonary arterial enlargement in the setting of pulmonary arterial hypertension. Electronically signed by: Dorethia Molt MD 03/04/2024 06:48 PM EST RP Workstation: [id number]    Microbiology: No results found for this or any previous visit (from the past 240 hours).   Labs: Basic Metabolic Panel: Recent Labs  Lab 03/12/24 0603 03/13/24 0500 03/14/24 9491 03/15/24 0538 03/16/24 0335 03/17/24 0615 03/18/24 0539  NA 132* 131* 129* 132* 132* 131* 132*  K 4.7 4.6 4.4 4.5 4.2 4.5 4.4  CL 94* 94* 92* 94* 93* 95* 97*  CO2 25 26 26 28 28 27 26   GLUCOSE 113* 114* 141* 138* 141* 150* 127*  BUN 22* 24* 26* 27* 24* 20 17  CREATININE 1.21 1.19 1.19 1.31* 1.23 1.04 1.00  CALCIUM  9.0 9.1 8.8* 9.2 9.0 9.2 8.9  MG 2.4 2.6* 2.3 2.5*  --   --   --    Liver Function Tests: No results for input(s): AST, ALT, ALKPHOS, BILITOT, PROT, ALBUMIN in the last 168 hours. No results for input(s): LIPASE, AMYLASE in the last 168 hours. No results for input(s): AMMONIA in the last 168 hours. CBC: Recent Labs  Lab 03/16/24 0335 03/17/24 0615 03/18/24 0539  WBC 9.7 10.1 7.7  HGB 17.9* 17.1* 16.4  HCT 51.7 50.2 48.4  MCV 90.2 90.9 91.1  PLT 284 255 231   Cardiac Enzymes: No results for input(s): CKTOTAL, CKMB, CKMBINDEX, TROPONINI in the last 168 hours. BNP: BNP (last 3 results) Recent Labs    08/03/23 1028  BNP 892.8*    ProBNP (last 3  results) Recent Labs    03/04/24 1833  PROBNP 3,698.0*    CBG: Recent Labs  Lab 03/14/24 1217 03/17/24 0528  GLUCAP 117* 126*       Signed:  Sigurd Pac MD.  [facility] 03/18/2024, 12:16 PM       [1]  Allergies Allergen Reactions   Penicillins Other (See Comments)    Childhood allergy Unknown reaction  Did it involve swelling of the face/tongue/throat, SOB, or low BP? No Did it involve sudden or severe rash/hives, skin peeling, or any reaction on the inside of your mouth or nose? No Did you need to seek medical attention at a hospital or doctor's office? No When did it last happen?      child If all above answers are NO, may proceed with cephalosporin use.

## 2024-03-19 DIAGNOSIS — I5023 Acute on chronic systolic (congestive) heart failure: Secondary | ICD-10-CM | POA: Diagnosis not present

## 2024-03-19 LAB — CBC
HCT: 47.5 % (ref 39.0–52.0)
Hemoglobin: 16.4 g/dL (ref 13.0–17.0)
MCH: 31.5 pg (ref 26.0–34.0)
MCHC: 34.5 g/dL (ref 30.0–36.0)
MCV: 91.3 fL (ref 80.0–100.0)
Platelets: 247 K/uL (ref 150–400)
RBC: 5.2 MIL/uL (ref 4.22–5.81)
RDW: 12.9 % (ref 11.5–15.5)
WBC: 9 K/uL (ref 4.0–10.5)
nRBC: 0 % (ref 0.0–0.2)

## 2024-03-19 LAB — BASIC METABOLIC PANEL WITH GFR
Anion gap: 10 (ref 5–15)
BUN: 17 mg/dL (ref 6–20)
CO2: 24 mmol/L (ref 22–32)
Calcium: 8.9 mg/dL (ref 8.9–10.3)
Chloride: 99 mmol/L (ref 98–111)
Creatinine, Ser: 0.92 mg/dL (ref 0.61–1.24)
GFR, Estimated: >60 mL/min
Glucose, Bld: 120 mg/dL — ABNORMAL HIGH (ref 70–99)
Potassium: 4.6 mmol/L (ref 3.5–5.1)
Sodium: 133 mmol/L — ABNORMAL LOW (ref 135–145)

## 2024-03-19 LAB — COOXEMETRY PANEL
Carboxyhemoglobin: 1 % (ref 0.5–1.5)
Methemoglobin: <0.7 % (ref 0.0–1.5)
O2 Saturation: 59.5 %
Total hemoglobin: 16.4 g/dL — ABNORMAL HIGH (ref 12.0–16.0)

## 2024-03-19 MED ORDER — TORSEMIDE 20 MG PO TABS: 20.0000 mg | ORAL_TABLET | ORAL | Status: DC

## 2024-03-19 MED ORDER — AMIODARONE HCL 200 MG PO TABS
200.0000 mg | ORAL_TABLET | Freq: Two times a day (BID) | ORAL | Status: DC
  Administered 2024-03-19: 200 mg via ORAL
  Filled 2024-03-19: qty 1

## 2024-03-19 MED ORDER — AMIODARONE HCL 200 MG PO TABS
ORAL_TABLET | ORAL | 0 refills | Status: DC
  Filled 2024-03-19: qty 30, fill #0

## 2024-03-19 NOTE — Progress Notes (Signed)
 Patient seen and examined, no changes from my discharge summary yesterday Remove PICC line prior to discharge, has a port for home milrinone  -Has heart failure clinic follow-up on 1/9  Sigurd Pac, MD

## 2024-03-19 NOTE — Progress Notes (Signed)
 Patient ID: Reginald Fernandez, male   DOB: Jul 06, 1980, 44 y.o.   MRN: [id number]   Advanced Heart Failure Rounding Note  Chief Complaint: Low output HF Significant Events:    12/23: RHC w RA 6, PA 36/22 (29), PCW 12, TD CO/CI 4.3/1.7, PVR 3.5 WU, PAPi 2.3  Subjective:    Co-ox 60% on milrinone  0.125. CVP 5. Weight stable.   LifeVest at bedside  No complaints today, ready to go home.   Objective:    Vital Signs:   Temp:  [97.7 F (36.5 C)-98.1 F (36.7 C)] 97.7 F (36.5 C) (01/01 0448) Pulse Rate:  [75-92] 83 (01/01 0448) Resp:  [18-20] 18 (01/01 0448) BP: (106-140)/(70-84) 108/77 (01/01 0448) SpO2:  [99 %-100 %] 100 % (01/01 0448) Weight:  [120.4 kg] 120.4 kg (01/01 0448) Last BM Date : 03/15/24  Weight change: Filed Weights   03/17/24 0425 03/18/24 0431 03/19/24 0448  Weight: 123.4 kg 119.5 kg 120.4 kg   Intake/Output:  Intake/Output Summary (Last 24 hours) at 03/19/2024 0715 Last data filed at 03/18/2024 2228 Gross per 24 hour  Intake 800 ml  Output 950 ml  Net -150 ml    Physical Exam: General: NAD Neck: No JVD, no thyromegaly or thyroid nodule.  Lungs: Clear to auscultation bilaterally with normal respiratory effort. CV: Nondisplaced PMI.  Heart regular S1/S2, no S3/S4, no murmur.  No peripheral edema.   Abdomen: Soft, nontender, no hepatosplenomegaly, no distention.  Skin: Intact without lesions or rashes.  Neurologic: Alert and oriented x 3.  Psych: Normal affect. Extremities: No clubbing or cyanosis.  HEENT: Normal.   Telemetry: NSR with rare PVCs (personally reviewed)  Labs: Basic Metabolic Panel: Recent Labs  Lab 03/13/24 0500 03/14/24 0508 03/15/24 0538 03/16/24 0335 03/17/24 0615 03/18/24 0539 03/19/24 0526  NA 131* 129* 132* 132* 131* 132* 133*  K 4.6 4.4 4.5 4.2 4.5 4.4 4.6  CL 94* 92* 94* 93* 95* 97* 99  CO2 26 26 28 28 27 26 24   GLUCOSE 114* 141* 138* 141* 150* 127* 120*  BUN 24* 26* 27* 24* 20 17 17   CREATININE 1.19 1.19 1.31* 1.23  1.04 1.00 0.92  CALCIUM  9.1 8.8* 9.2 9.0 9.2 8.9 8.9  MG 2.6* 2.3 2.5*  --   --   --   --     Liver Function Tests: No results for input(s): AST, ALT, ALKPHOS, BILITOT, PROT, ALBUMIN in the last 168 hours.  No results for input(s): LIPASE, AMYLASE in the last 168 hours.  No results for input(s): AMMONIA in the last 168 hours.  CBC: Recent Labs  Lab 03/16/24 0335 03/17/24 0615 03/18/24 0539 03/19/24 0526  WBC 9.7 10.1 7.7 9.0  HGB 17.9* 17.1* 16.4 16.4  HCT 51.7 50.2 48.4 47.5  MCV 90.2 90.9 91.1 91.3  PLT 284 255 231 247   BNP (last 3 results) Recent Labs    08/03/23 1028  BNP 892.8*   ProBNP (last 3 results) Recent Labs    03/04/24 1833  PROBNP 3,698.0*   Medications:    Scheduled Medications:  amiodarone   400 mg Oral BID   apixaban   5 mg Oral BID   atorvastatin   80 mg Oral q1800   Chlorhexidine  Gluconate Cloth  6 each Topical Daily   dapagliflozin  propanediol  10 mg Oral Daily   digoxin   0.0625 mg Oral Daily   ezetimibe   10 mg Oral Daily   guaiFENesin   600 mg Oral BID   mexiletine  300 mg Oral BID  pantoprazole   40 mg Oral Daily   sacubitril -valsartan   1 tablet Oral BID   sodium chloride  flush  10-40 mL Intracatheter Q12H   sodium chloride  flush  3 mL Intravenous Q12H   spironolactone   25 mg Oral Daily    Infusions:  milrinone  0.125 mcg/kg/min (03/18/24 1810)    PRN Medications: acetaminophen , methocarbamol , naLOXone  (NARCAN )  injection, nitroGLYCERIN , ondansetron  (ZOFRAN ) IV, mouth rinse, polyethylene glycol, senna-docusate, sodium chloride  flush, sodium chloride  flush, traMADol   Assessment/Plan:   A/C Biventricular HFrEF, ICM +/- NICM component.  - LV/RV severely reduced for the last couple of years. Followed in the HF clinic with previous discussions about advanced therapies. We are still concerned he may need advanced therapies. Suspect abdominal issues are related to low output HF.  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 -  RHC 12/23 with normal filling pressures with severely low cardiac output and PH - ECHO: EF < 20% with RV severely decreased systolic function.  - Co-ox 51% on milrinone  0.125.  Repeat - CVP 5, continue to hold torsemide  for now, restart lower dose 20 mg every other day beginning tomorrow.  - Continue digoxin  0.0625 mg daily  - Continue entresto  49-51 mg bid.  - Continue spiro 25 mg daily   - Continue farxiga  10 mg daily - Failed Bidil  with severe HA - Blood type B+. W/u for advanced therapies. Will refer to [facility] as outpatient for transplant once insurance kicks in (04/20/23).  - Plan in place to get home milrinone  and LifeVest. LifeVest approved and at bedside.  Now with tunneled PICC.  Letter of guaranty set up for home milrinone  starting today so will be hooked up to go home.   2. NSVT/PVCs   - High PVC burden initially, esp while on milrinone  - Continue amio 400 mg bid (taper to 200 daily as outpatient) + mexitil  300 mg bid - Keep K >4 and Mg >2. Stable.  - TSH ok - continue current therapy   Abdominal Pain - CT negative. Suspect this is related to low output heart failure.  - Resolved   CAD  - Strong family history of premature CAD.  H/o inferolateral STEMI in 9/15 with DES to LCx into OM1 and DES to RCA.  LHC 7/23 demonstrated diffuse CAD but no severe stenosis to explain worsening of his EF.   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 No bleeding   He will go home today on milrinone  as hopeful bridge to transplant  Will need [facility] evaluation at [facility] for end stage cardiomyopathy, will make referral for 04/19/24 when his insurance start.  He will wear Lifevest.  Needs to stop vaping.  We will arrange CHF clinic followup.   Meds for home: Torsemide  20 mg every other day starting tomorrow Entresto  49/51 bid Milrinone  0.125 infusion Amiodarone  200 mg bid x 1 week then 200 mg daily Apixaban  5 bid Atorvastatin  80 Dapagliflozin  10 Digoxin   0.0625 daily Zetia  10 Mexiletine 300 bid Spironolactone  25 daily  Reginald Fernandez 03/19/2024 7:15 AM

## 2024-03-19 NOTE — TOC Transition Note (Signed)
 Transition of Care [facility]) - Discharge Note   Patient Details  Name: Reginald Fernandez MRN: [id number] Date of Birth: Apr 29, 1980  Transition of [facility] CM/SW Contact:  Waddell Barnie Rama, RN Phone Number: 03/19/2024, 10:06 AM   Clinical Narrative:    For dc today,  NCM spoke with Holley Herring she state he is good to go with home Milrinone .  He has the life vest.     Barriers to Discharge: Continued Medical Work up   Patient Goals and CMS Choice Patient states their goals for this hospitalization and ongoing recovery are:: wants to get better CMS Medicare.gov Compare Post Acute Care list provided to:: Patient Choice offered to / list presented to : Patient      Discharge Placement                       Discharge Plan and Services Additional resources added to the After Visit Summary for     Discharge Planning Services: CM Consult Post Acute Care Choice: Home Health          DME Arranged: Life vest DME Agency: [facility] Date DME Agency Contacted: 03/12/24 Time DME Agency Contacted: 1007 Representative spoke with at DME Agency: Alm [facility] Arranged: RN HH Agency: [facility] Date HH Agency Contacted: 03/10/24 Time HH Agency Contacted: 1100 Representative spoke with at [facility] Agency: Holley Herring  Social Drivers of Health (SDOH) Interventions SDOH Screenings   Food Insecurity: No Food Insecurity (03/07/2024)  Housing: Low Risk (03/07/2024)  Transportation Needs: No Transportation Needs (03/07/2024)  Utilities: Not At Risk (03/07/2024)  Depression (PHQ2-9): Low Risk (06/27/2022)  Social Connections: Unknown (03/07/2024)  Tobacco Use: Medium Risk (03/04/2024)     Readmission Risk Interventions     No data to display

## 2024-03-20 ENCOUNTER — Other Ambulatory Visit (HOSPITAL_COMMUNITY): Payer: Self-pay

## 2024-03-20 ENCOUNTER — Telehealth: Payer: Self-pay

## 2024-03-20 ENCOUNTER — Telehealth (HOSPITAL_COMMUNITY): Payer: Self-pay | Admitting: Licensed Clinical Social Worker

## 2024-03-20 NOTE — Telephone Encounter (Signed)
 CSW attempted to call pt following recent hospital stay to further discuss him applying for Medicaid- unable to reach -left VM requesting return call  Andriette HILARIO Leech, LCSW Clinical Social Worker Advanced Heart Failure Clinic Desk#: [phone] Cell#: [phone]

## 2024-03-20 NOTE — Transitions of Care (Post Inpatient/ED Visit) (Signed)
" ° °  03/20/2024  Name: Reginald Ray Dibenedetto Jr. MRN: [id number] DOB: February 27, 1981  Today's TOC FU Call Status: Today's TOC FU Call Status:: Unsuccessful Call (1st Attempt) Unsuccessful Call (1st Attempt) Date: 03/20/24  Attempted to reach the patient regarding the most recent Inpatient/ED visit.  Follow Up Plan: Additional outreach attempts will be made to reach the patient to complete the Transitions of Care (Post Inpatient/ED visit) call.    Toye Rouillard J. Keaden Gunnoe RN, MSN [facility], [facility] RN Care Manager Direct Dial: [phone]  Fax: [phone] Website: [email or web address]   "

## 2024-03-24 NOTE — Progress Notes (Signed)
 "  ADVANCED HF CLINIC NOTE  Referring Physician: [name], MD Primary Care: [name], MD Primary Cardiologist: Alm Clay, MD  Chief Complaint:  HPI: Reginald Fernandez is a 44 y.o. male with CAD s/p multiple MIs, initial inferior STEMI in 2011, HTN, HLD, tobacco use, chronic HFrEF, and previously poor medication compliance.    Over the last several years he has had multiple caths. Last LHC 2023 diffuse CAD but no severe stenoses that would explain worsening of EF.      Echo 10/21 showed slightly improved EF, 30-35%, RV normal.    Unfortunately, was lost to f/u again and had been off meds for > 1 year. Admitted 7/23 with a/c CHF and epigastric abdominal pain. Diuresed with IV lasix . Echo EF < 20%, RV severely reduced. LHC showed diffuse CAD but no severe stenosis to explain worsening of his EF. RHC mildly elevated PCWP w/ pulmonary venous hypertension, normal RA pressure and preserved CO. GDMT titrated. Discharged home, weight 253 lbs. Reestablished care in the [facility].    RHC 2025 RA 12, PA 60/40 (47), PCW 35, SVR 1810 Fick CO 3.08, CI 1.3, Thermo CO 4.1 CI 1.7   No Showed/cancelled HF & VAD appointment May June and July 2025.   Admitted 12/25 with A/C BiV HF. Started on milrinone  and diuresed with IV lasix . Echo with LVEF < 20% RV severely reduced. RHC w RA 6, PA 36/22 (29), PCW 12, TD CO/CI 4.3/1.7, PVR 3.5 WU, PAPi 2.3. Work up started for advanced therapies and home inotrope's. Admission prolonged d/t patient losing insurance 03/19/24. LOG signed for inotropes through January. Unable to place referral to [facility] for Heart T[facility] d/t lack of insurance. LifeVest was approved and delivered to the bedside. HFSW team worked with patient. Patient will get insurance again 04/19/24.   Today he returns for AHF follow up. Overall feeling good. Denies palpitations, CP, dizziness, edema, or PND/Orthopnea. No SOB. Appetite ok, avoids salty food. Drinks <2L fluid a day. No fever or chills. Weight  at home 264-267 pounds. Taking all medications. Denies ETOH, tobacco or drug use. SBP at home 130s. Has not been vaping but has urges, looking for alternative.    Past Medical History:  Diagnosis Date   CAD S/P percutaneous coronary angioplasty 11/20/2013   a. 09/2009: EKG with Inf STEMI - no obstructive CAD;  b. 11/2013 Inflat STEMI/PCI: LM nl, LAD 20p, D1 sm - diff dzs, D2 large - nl, LCX 95-99, OM1 100 (3.5x38 Promus Premier DES), RCA 95-59m, 80d (3.0x20 and 3.0x16 Promus Premier DES') - normal EF;  c. NSTEMI 5/18 - patent stents, otw minimal CAD.- EF by Echo 35-40%; d. NSTEMI 10/2018 - ? culprit - Small branch of  D2,Med Rx,.  EF by Echo ~25%   Cardiomyopathy, ischemic 07/2016   h/o Inf STEMI 11/2013 - EF was Normal; b) NSTEMI 07/2016 (NO Cultprit on Cath) - Echo EF 35-40% (Basal Ant-Lat HK, basal-mid Inferolateral & apical Akinesis); c) NSTEMI 10/2018 -felt to be occluded small branch of D2, Echo EF further decreased to 20-25%.    Essential hypertension    Hyperlipidemia with target LDL less than 100    Marijuana abuse    Migraine    Morbid obesity (HCC)    ST-segment elevation myocardial infarction (STEMI) of inferior wall (HCC) 11/20/2013    Current Outpatient Medications  Medication Sig Dispense Refill   apixaban  (ELIQUIS ) 5 MG TABS tablet Take 1 tablet (5 mg total) by mouth 2 (two) times daily. 60 tablet  0   dapagliflozin  propanediol (FARXIGA ) 10 MG TABS tablet Take 1 tablet (10 mg total) by mouth daily. 90 tablet 0   docusate sodium  (COLACE) 100 MG capsule Take 1 capsule (100 mg total) by mouth every 12 (twelve) hours. (Patient taking differently: Take 100 mg by mouth as needed.) 60 capsule 0   milrinone  (PRIMACOR ) 20 MG/100 ML SOLN infusion Inject 0.0157 mg/min into the vein continuous.     nitroGLYCERIN  (NITROSTAT ) 0.4 MG SL tablet Place 1 tablet (0.4 mg total) under the tongue every 5 (five) minutes as needed for chest pain. 25 tablet 2   pantoprazole  (PROTONIX ) 40 MG tablet Take 1  tablet (40 mg total) by mouth daily. 30 tablet 1   sacubitril -valsartan  (ENTRESTO ) 97-103 MG Take 1 tablet by mouth 2 (two) times daily. 60 tablet 11   traMADol  (ULTRAM ) 50 MG tablet Take 1 tablet (50 mg total) by mouth every 6 (six) hours as needed. (Patient taking differently: Take 50 mg by mouth every 6 (six) hours as needed for moderate pain (pain score 4-6).) 15 tablet 0   acetaminophen  (TYLENOL ) 325 MG tablet Take 2 tablets (650 mg total) by mouth every 6 (six) hours as needed for mild pain (or Fever >/= 101).     amiodarone  (PACERONE ) 200 MG tablet Take 1 tablet (200 mg total) by mouth daily. 90 tablet 3   atorvastatin  (LIPITOR ) 80 MG tablet Take 1 tablet (80 mg total) by mouth daily at 6 PM 90 tablet 0   colchicine  0.6 MG tablet Take 1 tablet (0.6 mg total) by mouth daily for 7 days. 7 tablet 0   digoxin  (LANOXIN ) 0.125 MG tablet Take 0.5 tablets (0.0625 mg total) by mouth daily. 45 tablet 3   ezetimibe  (ZETIA ) 10 MG tablet Take 1 tablet (10 mg total) by mouth daily. 30 tablet 1   mexiletine (MEXITIL ) 150 MG capsule Take 2 capsules (300 mg total) by mouth 2 (two) times daily. 360 capsule 3   senna-docusate (SENOKOT-S) 8.6-50 MG tablet Take 1 tablet by mouth 2 (two) times daily.     spironolactone  (ALDACTONE ) 25 MG tablet Take 1 tablet (25 mg total) by mouth daily. 90 tablet 3   torsemide  (DEMADEX ) 20 MG tablet Take 1 tablet (20 mg total) by mouth every other day. 90 tablet 3   No current facility-administered medications for this encounter.    Allergies[1]    Social History   Socioeconomic History   Marital status: Married    Spouse name: Not on file   Number of children: 2   Years of education: Not on file   Highest education level: Not on file  Occupational History    Employer: [employer]    Comment: [place]  Tobacco Use   Smoking status: Former    Current packs/day: 0.00    Types: Cigarettes    Quit date: 09/2016    Years since quitting: 7.5    Smokeless tobacco: Never  Vaping Use   Vaping status: Every Day  Substance and Sexual Activity   Alcohol use: No    Alcohol/week: 0.0 standard drinks of alcohol   Drug use: No   Sexual activity: Yes  Other Topics Concern   Not on file  Social History Narrative   Lives in [place] with wife.  Works with mentally challenged adults.   Social Drivers of Health   Tobacco Use: Medium Risk (03/27/2024)   Patient History    Smoking Tobacco Use: Former    Smokeless Tobacco Use: Never  Passive Exposure: Not on file  Financial Resource Strain: Not on file  Food Insecurity: No Food Insecurity (03/07/2024)   Epic    Worried About Programme Researcher, Broadcasting/film/video in the Last Year: Never true    Ran Out of Food in the Last Year: Never true  Transportation Needs: No Transportation Needs (03/07/2024)   Epic    Lack of Transportation (Medical): No    Lack of Transportation (Non-Medical): No  Physical Activity: Not on file  Stress: Not on file  Social Connections: Unknown (03/07/2024)   Social Connection and Isolation Panel    Frequency of Communication with Friends and Family: More than three times a week    Frequency of Social Gatherings with Friends and Family: More than three times a week    Attends Religious Services: Never    Database Administrator or Organizations: Yes    Attends Banker Meetings: Never    Marital Status: Not on file  Intimate Partner Violence: Not At Risk (03/07/2024)   Epic    Fear of Current or Ex-Partner: No    Emotionally Abused: No    Physically Abused: No    Sexually Abused: No  Depression (PHQ2-9): Low Risk (06/27/2022)   Depression (PHQ2-9)    PHQ-2 Score: 2  Alcohol Screen: Not on file  Housing: Low Risk (03/07/2024)   Epic    Unable to Pay for Housing in the Last Year: No    Number of Times Moved in the Last Year: 0    Homeless in the Last Year: No  Utilities: Not At Risk (03/07/2024)   Epic    Threatened with loss of utilities: No  Health  Literacy: Not on file      Family History  Problem Relation Age of Onset   Hypertension Mother    Heart attack Father 63       2 MIs by age 74 (first at 66)-- CABG   Hypertension Father    Heart failure Father    Hyperlipidemia Father    Heart attack Maternal Grandfather        32's   Heart attack Paternal Grandfather        6's   Heart attack Other     Vitals:   03/27/24 0929  BP: 139/82  Pulse: 72  SpO2: 96%  Weight: 126.7 kg (279 lb 6.4 oz)  Height: 6' 2 (1.88 m)    PHYSICAL EXAM: General:  well appearing.  No respiratory difficulty. Walked into clinic.  Neck: JVD flat.  Cor: Regular rate & rhythm. No murmurs. Lungs: clear Extremities: no edema  Neuro: alert & oriented x 3. Affect pleasant.   Wt Readings from Last 3 Encounters:  03/27/24 126.7 kg (279 lb 6.4 oz)  03/19/24 120.4 kg (265 lb 8 oz)  08/23/23 118.6 kg (261 lb 6.4 oz)    ECG: NSR 85 bpm, BAE, L posterior fascicular block (Personally reviewed)    LifeVest interrogation: avg wear time 21.5 hr/day. 5 artifact alerts. No events (Personally reviewed)    ASSESSMENT & PLAN: Chronic Biventricular HFrEF, ICM +/- NICM component.  - LV/RV severely reduced for the last couple of years. Followed in the HF clinic with previous discussions about advanced therapies. We are still concerned he may need advanced therapies.   - NYHA Stage D. - RHC 12/23 with normal filling pressures with severely low cardiac output and PH - ECHO: EF < 20% with RV severely decreased systolic function.  - On Milrinone  at 0.125, dose  personally reviewed.  - Continue Torsemide  20 mg every other day. Weight up but has been stable at home. Will await for labs to come back. No diuretic changes at this time.  - Continue digoxin  0.0625 mg daily  - Increase entresto  49-51>97-103 mg bid. - Continue spiro 25 mg daily   - Continue farxiga  10 mg daily - Failed Bidil  with severe HA - BMET/P-BNP today, repeat BMET in 1 week, will arrange with  HH.  - Blood type B+. W/u for advanced therapies. Will refer to [facility] as outpatient for transplant once insurance kicks in (04/20/23).    NSVT/PVCs   - High PVC burden initially, esp while on milrinone  - Now on amiodarone  200 mg daily + mexitil  300 mg bid  - Keep K >4 and Mg >2. Stable.  - TSH ok - No PVCs on EKG today - Continue current therapy   CAD  - Strong family history of premature CAD.  H/o inferolateral STEMI in 9/15 with DES to LCx into OM1 and DES to RCA.  LHC 7/23 demonstrated diffuse CAD but no severe stenosis to explain worsening of his EF.   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 No bleeding    Needs transplant evaluation at [facility] for end stage cardiomyopathy, will make referral for 04/19/24 when his insurance start (message sent today to coordinators).  Continue Lifevest.    Follow up in 1 month with APP. New insurance kicks in 04/19/24. Samples provided today to ensure he has enough to make it through insurance renewal.   Beckey LITTIE Coe AGACNP-BC  03/27/2024 10:40 AM  [facility] [address] [facility] [place] [address] [phone] (office)     [1]  Allergies Allergen Reactions   Penicillins Other (See Comments)    Childhood allergy Unknown reaction  Did it involve swelling of the face/tongue/throat, SOB, or low BP? No Did it involve sudden or severe rash/hives, skin peeling, or any reaction on the inside of your mouth or nose? No Did you need to seek medical attention at a hospital or doctor's office? No When did it last happen?      child If all above answers are NO, may proceed with cephalosporin use.   "

## 2024-03-26 ENCOUNTER — Telehealth (HOSPITAL_COMMUNITY): Payer: Self-pay

## 2024-03-26 ENCOUNTER — Telehealth (HOSPITAL_COMMUNITY): Payer: Self-pay | Admitting: Licensed Clinical Social Worker

## 2024-03-26 NOTE — Telephone Encounter (Signed)
 Called to confirm/remind patient of their appointment at the [facility] on 04/07/23 9:30.   Appointment:   [x] Confirmed  [] Left mess   [] No answer/No voice mail  [] VM Full/unable to leave message  [] Phone not in service  Patient reminded to bring all medications and/or complete list.  Confirmed patient has transportation. Gave directions, instructed to utilize valet parking.

## 2024-03-26 NOTE — Telephone Encounter (Signed)
 H&V Care Navigation CSW Progress Note  Clinical Social Worker called pt to inquire about Medicaid.  Patient agreeable to applying- provided with information regarding [facility] Medicaid case workers- he will plan to go by during walk in hours to complete.  Andriette HILARIO Leech, LCSW Clinical Social Worker [facility] Desk#: [phone] Cell#: [phone]

## 2024-03-27 ENCOUNTER — Ambulatory Visit (HOSPITAL_COMMUNITY): Payer: Self-pay | Admitting: Internal Medicine

## 2024-03-27 ENCOUNTER — Other Ambulatory Visit (HOSPITAL_COMMUNITY): Payer: Self-pay

## 2024-03-27 ENCOUNTER — Ambulatory Visit (HOSPITAL_COMMUNITY): Admit: 2024-03-27 | Discharge: 2024-03-27 | Disposition: A | Attending: Internal Medicine

## 2024-03-27 ENCOUNTER — Encounter (HOSPITAL_COMMUNITY): Payer: Self-pay

## 2024-03-27 VITALS — BP 139/82 | HR 72 | Ht 74.0 in | Wt 279.4 lb

## 2024-03-27 DIAGNOSIS — Z7984 Long term (current) use of oral hypoglycemic drugs: Secondary | ICD-10-CM | POA: Insufficient documentation

## 2024-03-27 DIAGNOSIS — I110 Hypertensive heart disease with heart failure: Secondary | ICD-10-CM | POA: Diagnosis present

## 2024-03-27 DIAGNOSIS — Z8249 Family history of ischemic heart disease and other diseases of the circulatory system: Secondary | ICD-10-CM | POA: Insufficient documentation

## 2024-03-27 DIAGNOSIS — I5082 Biventricular heart failure: Secondary | ICD-10-CM | POA: Diagnosis not present

## 2024-03-27 DIAGNOSIS — I493 Ventricular premature depolarization: Secondary | ICD-10-CM | POA: Diagnosis not present

## 2024-03-27 DIAGNOSIS — Z86718 Personal history of other venous thrombosis and embolism: Secondary | ICD-10-CM | POA: Insufficient documentation

## 2024-03-27 DIAGNOSIS — Z79899 Other long term (current) drug therapy: Secondary | ICD-10-CM | POA: Insufficient documentation

## 2024-03-27 DIAGNOSIS — F17290 Nicotine dependence, other tobacco product, uncomplicated: Secondary | ICD-10-CM | POA: Diagnosis not present

## 2024-03-27 DIAGNOSIS — I472 Ventricular tachycardia, unspecified: Secondary | ICD-10-CM | POA: Insufficient documentation

## 2024-03-27 DIAGNOSIS — I2510 Atherosclerotic heart disease of native coronary artery without angina pectoris: Secondary | ICD-10-CM | POA: Diagnosis not present

## 2024-03-27 DIAGNOSIS — I4729 Other ventricular tachycardia: Secondary | ICD-10-CM

## 2024-03-27 DIAGNOSIS — I5022 Chronic systolic (congestive) heart failure: Secondary | ICD-10-CM | POA: Insufficient documentation

## 2024-03-27 DIAGNOSIS — Z7901 Long term (current) use of anticoagulants: Secondary | ICD-10-CM | POA: Diagnosis not present

## 2024-03-27 DIAGNOSIS — E785 Hyperlipidemia, unspecified: Secondary | ICD-10-CM | POA: Insufficient documentation

## 2024-03-27 DIAGNOSIS — I252 Old myocardial infarction: Secondary | ICD-10-CM | POA: Diagnosis not present

## 2024-03-27 DIAGNOSIS — Z86711 Personal history of pulmonary embolism: Secondary | ICD-10-CM | POA: Insufficient documentation

## 2024-03-27 LAB — BASIC METABOLIC PANEL WITH GFR
Anion gap: 9 (ref 5–15)
BUN: 14 mg/dL (ref 6–20)
CO2: 27 mmol/L (ref 22–32)
Calcium: 9.3 mg/dL (ref 8.9–10.3)
Chloride: 103 mmol/L (ref 98–111)
Creatinine, Ser: 1.04 mg/dL (ref 0.61–1.24)
GFR, Estimated: >60 mL/min
Glucose, Bld: 111 mg/dL — ABNORMAL HIGH (ref 70–99)
Potassium: 5 mmol/L (ref 3.5–5.1)
Sodium: 139 mmol/L (ref 135–145)

## 2024-03-27 LAB — PRO BRAIN NATRIURETIC PEPTIDE: Pro Brain Natriuretic Peptide: 468 pg/mL — ABNORMAL HIGH

## 2024-03-27 MED ORDER — TORSEMIDE 20 MG PO TABS
20.0000 mg | ORAL_TABLET | ORAL | 3 refills | Status: AC
  Filled 2024-03-27: qty 90, 180d supply, fill #0
  Filled 2024-04-23: qty 45, 90d supply, fill #0

## 2024-03-27 MED ORDER — SACUBITRIL-VALSARTAN 97-103 MG PO TABS: 1.0000 | ORAL_TABLET | Freq: Two times a day (BID) | ORAL | 11 refills | Status: AC

## 2024-03-27 MED ORDER — AMIODARONE HCL 200 MG PO TABS
200.0000 mg | ORAL_TABLET | Freq: Every day | ORAL | 3 refills | Status: AC
  Filled 2024-03-27 – 2024-04-23 (×2): qty 90, 90d supply, fill #0

## 2024-03-27 MED ORDER — EZETIMIBE 10 MG PO TABS
10.0000 mg | ORAL_TABLET | Freq: Every day | ORAL | 1 refills | Status: AC
  Filled 2024-03-27 – 2024-04-23 (×2): qty 30, 30d supply, fill #0

## 2024-03-27 MED ORDER — DIGOXIN 125 MCG PO TABS
0.0625 mg | ORAL_TABLET | Freq: Every day | ORAL | 3 refills | Status: AC
  Filled 2024-03-27 – 2024-04-23 (×2): qty 45, 90d supply, fill #0

## 2024-03-27 MED ORDER — ATORVASTATIN CALCIUM 80 MG PO TABS
80.0000 mg | ORAL_TABLET | Freq: Every day | ORAL | 0 refills | Status: AC
  Filled 2024-03-27: qty 90, 90d supply, fill #0

## 2024-03-27 MED ORDER — SPIRONOLACTONE 25 MG PO TABS
25.0000 mg | ORAL_TABLET | Freq: Every day | ORAL | 3 refills | Status: AC
  Filled 2024-03-27 – 2024-04-23 (×2): qty 90, 90d supply, fill #0

## 2024-03-27 MED ORDER — MEXILETINE HCL 150 MG PO CAPS
300.0000 mg | ORAL_CAPSULE | Freq: Two times a day (BID) | ORAL | 3 refills | Status: AC
  Filled 2024-03-27 – 2024-04-23 (×2): qty 360, 90d supply, fill #0

## 2024-03-27 NOTE — Progress Notes (Signed)
 Medication Samples have been provided to the patient.  Drug name: Eliquis        Strength: 5 mg         Qty: 4 boxes  LOT: [id number]  Exp.Date: 06/27  Dosing instructions: take 1 tablet Twice daily  Medication Samples have been provided to the patient.  Drug name: Farxiga         Strength: 10 mg         Qty: 4 boxes   LOT: [id number]  Exp.Date: 09-16-26  Dosing instructions: take 1 tablet daily  The patient has been instructed regarding the correct time, dose, and frequency of taking this medication, including desired effects and most common side effects.   Reginald Fernandez 10:23 AM 03/27/2024

## 2024-03-27 NOTE — Patient Instructions (Signed)
 CHANGE Amiodarone  to 200 mg Twice daily  CHANGE Entresto  to 97/103 mg Twice daily  Labs done today, your results will be available in MyChart, we will contact you for abnormal readings.  Your physician recommends that you schedule a follow-up appointment in: 1 month.  If you have any questions or concerns before your next appointment please send us  a message through [place] or call our office at [phone].    TO LEAVE A MESSAGE FOR THE NURSE SELECT OPTION 2, PLEASE LEAVE A MESSAGE INCLUDING: YOUR NAME DATE OF BIRTH CALL BACK NUMBER REASON FOR CALL**this is important as we prioritize the call backs  YOU WILL RECEIVE A CALL BACK THE SAME DAY AS LONG AS YOU CALL BEFORE 4:00 PM  At the [facility], you and your health needs are our priority. As part of our continuing mission to provide you with exceptional heart care, we have created designated Provider Care Teams. These Care Teams include your primary Cardiologist (physician) and Advanced Practice Providers (APPs- Physician Assistants and Nurse Practitioners) who all work together to provide you with the care you need, when you need it.   You may see any of the following providers on your designated Care Team at your next follow up: Dr Toribio Fuel Dr [name] Dr. Morene Brownie Greig Mosses, NP Caffie Shed, [place] [facility] [place], [place] Beckey Coe, NP Jordan Lee, NP Ellouise Class, NP Tinnie Redman, PharmD Jaun Bash, PharmD   Please be sure to bring in all your medications bottles to every appointment.    Thank you for choosing [facility]

## 2024-04-15 ENCOUNTER — Other Ambulatory Visit (HOSPITAL_COMMUNITY): Payer: Self-pay | Admitting: Internal Medicine

## 2024-04-23 ENCOUNTER — Other Ambulatory Visit (HOSPITAL_COMMUNITY): Payer: Self-pay

## 2024-04-24 ENCOUNTER — Other Ambulatory Visit: Payer: Self-pay

## 2024-04-28 ENCOUNTER — Ambulatory Visit (HOSPITAL_COMMUNITY)
